# Patient Record
Sex: Female | Born: 1970 | Race: White | Hispanic: No | Smoking: Never smoker
Health system: Southern US, Community
[De-identification: ages and names within clinical notes are randomized; demographics above are authoritative.]

## PROBLEM LIST (undated history)

## (undated) DIAGNOSIS — E669 Obesity, unspecified: Secondary | ICD-10-CM

## (undated) DIAGNOSIS — F419 Anxiety disorder, unspecified: Secondary | ICD-10-CM

## (undated) DIAGNOSIS — R197 Diarrhea, unspecified: Secondary | ICD-10-CM

## (undated) DIAGNOSIS — R0602 Shortness of breath: Secondary | ICD-10-CM

## (undated) DIAGNOSIS — M549 Dorsalgia, unspecified: Secondary | ICD-10-CM

## (undated) DIAGNOSIS — F329 Major depressive disorder, single episode, unspecified: Secondary | ICD-10-CM

## (undated) DIAGNOSIS — E559 Vitamin D deficiency, unspecified: Secondary | ICD-10-CM

## (undated) DIAGNOSIS — E785 Hyperlipidemia, unspecified: Secondary | ICD-10-CM

## (undated) DIAGNOSIS — F32A Depression, unspecified: Secondary | ICD-10-CM

## (undated) DIAGNOSIS — R002 Palpitations: Secondary | ICD-10-CM

## (undated) DIAGNOSIS — R609 Edema, unspecified: Secondary | ICD-10-CM

## (undated) DIAGNOSIS — K829 Disease of gallbladder, unspecified: Secondary | ICD-10-CM

## (undated) DIAGNOSIS — I10 Essential (primary) hypertension: Secondary | ICD-10-CM

## (undated) DIAGNOSIS — F3281 Premenstrual dysphoric disorder: Secondary | ICD-10-CM

## (undated) DIAGNOSIS — K118 Other diseases of salivary glands: Secondary | ICD-10-CM

## (undated) DIAGNOSIS — C49A0 Gastrointestinal stromal tumor, unspecified site: Secondary | ICD-10-CM

## (undated) DIAGNOSIS — R12 Heartburn: Secondary | ICD-10-CM

## (undated) DIAGNOSIS — R079 Chest pain, unspecified: Secondary | ICD-10-CM

## (undated) HISTORY — DX: Hyperlipidemia, unspecified: E78.5

## (undated) HISTORY — DX: Major depressive disorder, single episode, unspecified: F32.9

## (undated) HISTORY — DX: Disease of gallbladder, unspecified: K82.9

## (undated) HISTORY — DX: Anxiety disorder, unspecified: F41.9

## (undated) HISTORY — PX: TONSILLECTOMY: SUR1361

## (undated) HISTORY — DX: Essential (primary) hypertension: I10

## (undated) HISTORY — DX: Shortness of breath: R06.02

## (undated) HISTORY — DX: Dorsalgia, unspecified: M54.9

## (undated) HISTORY — DX: Obesity, unspecified: E66.9

## (undated) HISTORY — PX: APPENDECTOMY: SHX54

## (undated) HISTORY — PX: PARTIAL GASTRECTOMY: SHX2172

## (undated) HISTORY — DX: Diarrhea, unspecified: R19.7

## (undated) HISTORY — DX: Heartburn: R12

## (undated) HISTORY — DX: Palpitations: R00.2

## (undated) HISTORY — DX: Gastrointestinal stromal tumor, unspecified site: C49.A0

## (undated) HISTORY — DX: Vitamin D deficiency, unspecified: E55.9

## (undated) HISTORY — DX: Premenstrual dysphoric disorder: F32.81

## (undated) HISTORY — DX: Edema, unspecified: R60.9

## (undated) HISTORY — PX: CHOLECYSTECTOMY: SHX55

## (undated) HISTORY — DX: Chest pain, unspecified: R07.9

## (undated) HISTORY — DX: Depression, unspecified: F32.A

---

## 2007-11-19 ENCOUNTER — Emergency Department (HOSPITAL_COMMUNITY): Admission: EM | Admit: 2007-11-19 | Discharge: 2007-11-19 | Payer: Self-pay | Admitting: Family Medicine

## 2008-03-08 ENCOUNTER — Emergency Department (HOSPITAL_COMMUNITY): Admission: EM | Admit: 2008-03-08 | Discharge: 2008-03-08 | Payer: Self-pay | Admitting: Family Medicine

## 2009-02-17 ENCOUNTER — Emergency Department (HOSPITAL_COMMUNITY): Admission: EM | Admit: 2009-02-17 | Discharge: 2009-02-17 | Payer: Self-pay | Admitting: Family Medicine

## 2009-05-18 ENCOUNTER — Emergency Department (HOSPITAL_COMMUNITY): Admission: EM | Admit: 2009-05-18 | Discharge: 2009-05-18 | Payer: Self-pay | Admitting: Emergency Medicine

## 2010-05-01 LAB — POCT URINALYSIS DIP (DEVICE)
Bilirubin Urine: NEGATIVE
Glucose, UA: NEGATIVE mg/dL
Ketones, ur: NEGATIVE mg/dL
Specific Gravity, Urine: <=1.005 (ref 1.005–1.030)

## 2010-05-01 LAB — POCT PREGNANCY, URINE: Preg Test, Ur: NEGATIVE

## 2010-05-01 LAB — URINE CULTURE: Colony Count: 10000

## 2010-05-01 LAB — WET PREP, GENITAL: Yeast Wet Prep HPF POC: NONE SEEN

## 2010-07-17 ENCOUNTER — Ambulatory Visit
Admission: RE | Admit: 2010-07-17 | Discharge: 2010-07-17 | Disposition: A | Discharge status: Home / Self Care | Payer: Managed Care, Other (non HMO) | Source: Ambulatory Visit | Attending: Family Medicine | Admitting: Family Medicine

## 2010-07-17 ENCOUNTER — Other Ambulatory Visit: Payer: Self-pay | Admitting: Family Medicine

## 2010-07-17 DIAGNOSIS — R609 Edema, unspecified: Secondary | ICD-10-CM

## 2010-07-17 DIAGNOSIS — R52 Pain, unspecified: Secondary | ICD-10-CM

## 2011-01-15 ENCOUNTER — Emergency Department
Admission: EM | Admit: 2011-01-15 | Discharge: 2011-01-15 | Disposition: A | Discharge status: Home / Self Care | Payer: 59 | Source: Home / Self Care | Attending: Family Medicine | Admitting: Family Medicine

## 2011-01-15 ENCOUNTER — Encounter: Payer: Self-pay | Admitting: *Deleted

## 2011-01-15 DIAGNOSIS — J209 Acute bronchitis, unspecified: Secondary | ICD-10-CM

## 2011-01-15 MED ORDER — BENZONATATE 200 MG PO CAPS: 200.0000 mg | ORAL_CAPSULE | Freq: Every day | ORAL | Status: AC

## 2011-01-15 MED ORDER — CLARITHROMYCIN 500 MG PO TABS: 500.0000 mg | ORAL_TABLET | Freq: Two times a day (BID) | ORAL | Status: AC

## 2011-01-15 NOTE — ED Notes (Signed)
Pt c/o productive cough x 12/25. She has taken sudafed, nyquil, dayquil and guafensin.

## 2011-01-15 NOTE — ED Provider Notes (Signed)
History     CSN: [id number]  Arrival date & time 01/15/11  1111   First MD Initiated Contact with Patient 01/15/11 1230      Chief Complaint  Patient presents with  . Cough      HPI Comments: Patient complains of approximately 7 day history of gradually progressive URI symptoms beginning with a minimal sore throat (now resolved), followed by progressive cough.   There has been minimal nasal congestion.  Complains of fatigue but no myalgias.  Cough is now worse at night and generally  productive during the day.  She often coughs until she gags.  There has been no pleuritic pain, shortness of breath, but she occasionally  wheezes.  She has had a flu shot but does not remember her last Tdap.  Patient is a 41 y.o. female presenting with cough. The history is provided by the patient.  Cough This is a new problem. The current episode started more than 1 week ago. The problem occurs constantly. The problem has been gradually worsening. The cough is productive of sputum. There has been no fever. Associated symptoms include rhinorrhea, sore throat, myalgias and wheezing. Pertinent negatives include no chest pain, no chills, no sweats, no ear congestion, no ear pain, no headaches, no shortness of breath and no eye redness. She has tried cough syrup for the symptoms. The treatment provided no relief. She is not a smoker.    Past Medical History  Diagnosis Date  . Diabetes mellitus   . Hypertension     Past Surgical History  Procedure Date  . Appendectomy   . Cholecystectomy   . Tonsillectomy     History reviewed. No pertinent family history.  History  Substance Use Topics  . Smoking status: Never Smoker   . Smokeless tobacco: Not on file  . Alcohol Use: No    OB History    Grav Para Term Preterm Abortions TAB SAB Ect Mult Living                  Review of Systems  Constitutional: Negative for chills.  HENT: Positive for sore throat and rhinorrhea. Negative for ear pain.     Eyes: Negative for redness.  Respiratory: Positive for cough and wheezing. Negative for shortness of breath.   Cardiovascular: Negative for chest pain.  Musculoskeletal: Positive for myalgias.  Neurological: Negative for headaches.   + sore throat, resolved + cough No pleuritic pain ? wheezing + minimal nasal congestion No post-nasal drainage No sinus pain/pressure No itchy/red eyes No earache No hemoptysis No SOB No fever/chills No nausea No vomiting No abdominal pain No diarrhea No urinary symptoms No skin rashes + fatigue No myalgias No headache Used OTC meds without relief  Allergies  Codeine  Home Medications   Current Outpatient Rx  Name Route Sig Dispense Refill  . LIRAGLUTIDE 18 MG/3ML [place] SOLN Subcutaneous Inject into the skin.      Marland Kitchen LISINOPRIL 10 MG PO TABS Oral Take 10 mg by mouth daily.      Marland Kitchen VITAMIN D (CHOLECALCIFEROL) 400 UNITS PO CHEW Oral Chew by mouth.      . BENZONATATE 200 MG PO CAPS Oral Take 1 capsule (200 mg total) by mouth at bedtime. Take as needed for cough 12 capsule 0  . CLARITHROMYCIN 500 MG PO TABS Oral Take 1 tablet (500 mg total) by mouth 2 (two) times daily. 20 tablet 0    BP 115/81  Pulse 77  Temp(Src) 98.4 F (36.9 C) (Oral)  Resp 18  Ht 5\' 5"  (1.651 m)  Wt 257 lb 4 oz (116.688 kg)  BMI 42.81 kg/m2  SpO2 99%  Physical Exam Nursing notes and Vital Signs reviewed. Appearance:  Patient appears healthy, stated age, and in no acute distress.  Patient is obese (BMI 42.9)   Eyes:  Pupils are equal, round, and reactive to light and accomodation.  Extraocular movement is intact.  Conjunctivae are not inflamed  Ears:  Canals normal.  Tympanic membranes normal.  Nose:  Mildly congested turbinates.  No sinus tenderness.   Pharynx:  Normal Neck:  Supple.  Slightly tender shotty anterior/posterior nodes are palpated bilaterally  Lungs:  Clear to auscultation.  Breath sounds are equal.  Chest:  Distinct tenderness to palpation over  the mid-sternum.  Heart:  Regular rate and rhythm without murmurs, rubs, or gallops.  Abdomen:  Nontender without masses or hepatosplenomegaly.  Bowel sounds are present.  No CVA or flank tenderness.  Extremities:  No edema.  No calf tenderness Skin:  No rash present.   ED Course  Procedures  none      1. Acute bronchitis       MDM  ? Pertussis.  Begin Biaxin, and Tessalon at bedtime. Take Mucinex  (guaifenesin) for cough and congestion.  Increase fluid intake, rest. May use Afrin nasal spray (or generic oxymetazoline) twice daily for about 5 days.  Also recommend using saline nasal spray several times daily and/or saline nasal irrigation. Stop all antihistamines for now, and other non-prescription cough/cold preparations. Followup with PCP if not improving        Donna Christen, MD 01/15/11 1252

## 2011-03-29 ENCOUNTER — Other Ambulatory Visit: Payer: Self-pay

## 2011-03-29 ENCOUNTER — Encounter: Payer: Self-pay | Admitting: Obstetrics and Gynecology

## 2011-04-03 ENCOUNTER — Other Ambulatory Visit: Payer: Self-pay

## 2011-04-03 ENCOUNTER — Encounter: Payer: Self-pay | Admitting: Obstetrics and Gynecology

## 2011-04-17 ENCOUNTER — Other Ambulatory Visit: Payer: Self-pay

## 2011-04-17 DIAGNOSIS — Z30433 Encounter for removal and reinsertion of intrauterine contraceptive device: Secondary | ICD-10-CM

## 2011-05-07 ENCOUNTER — Other Ambulatory Visit: Payer: Self-pay | Admitting: Obstetrics and Gynecology

## 2011-05-07 DIAGNOSIS — E119 Type 2 diabetes mellitus without complications: Secondary | ICD-10-CM | POA: Insufficient documentation

## 2011-05-07 DIAGNOSIS — O139 Gestational [pregnancy-induced] hypertension without significant proteinuria, unspecified trimester: Secondary | ICD-10-CM | POA: Insufficient documentation

## 2011-05-07 NOTE — Telephone Encounter (Signed)
Message routed to Laura.

## 2011-05-08 ENCOUNTER — Other Ambulatory Visit: Payer: Self-pay

## 2011-05-08 ENCOUNTER — Encounter: Payer: Self-pay | Admitting: Obstetrics and Gynecology

## 2011-11-16 ENCOUNTER — Encounter: Payer: Self-pay | Admitting: *Deleted

## 2011-11-16 ENCOUNTER — Encounter: Payer: 59 | Attending: Physician Assistant | Admitting: *Deleted

## 2011-11-16 NOTE — Patient Instructions (Addendum)
Goals:  Follow Diabetes Meal Plan as instructed  Eat 3 meals and 2 snacks, every 3-5 hrs  Limit carbohydrate intake to 30-45 grams carbohydrate/meal  Limit carbohydrate intake to 15 grams carbohydrate/snack  Add lean protein foods to meals/snacks  Monitor glucose levels as instructed by your doctor  Aim for 30 mins of physical activity daily  Bring food record and glucose log to your next nutrition visit 

## 2011-11-16 NOTE — Progress Notes (Signed)
Patient was seen on 11/16/11 for the complete series of three diabetes self-management courses at the [facility]. Current A1c = 5.9%   The following learning objectives were met by the patient during this course: Core 1:  Gain an understanding of diabetes and what causes it  Learn how diabetes is treated and the goals of treatment  Learn why, how and when to test BG  Learn how carbohydrate affects your glucose  Receive a personal food plan and learn how to count carbohydrates  Discover how physical activity enhances glucose control and overall health  Begin to gain confidence in your ability to manage diabetes  Handouts given during this class include:  Type 2 Diabetes: Basics Book  My Food Plan Book  Food and Activity Log  Core 2:  Describe causes, symptoms and treatment of hypoglycemia and hyperglycemia  Learn how to care for your glucose meter and strips  Explain how to manage diabetes during illness  List strategies to follow meal plan when dining out  Describe the effects of alcohol on glucose and how to use it safely  Describe problem solving skills for day-to-day glucose challenges  Describe ways to remain physically active  Describe the impact of regular activity on insulin resistance  Handouts given in this class:  Refrigerator magnet for Sick Day Guidelines  [facility] Oral Medication and Insulin handout  Core 3  Describe how diabetes changes over time  Understand why glucose may be out of target  Learn how diabetes changes over time  Learn about blood pressure, cholesterol, and heart health  Learn about lowering dietary fat and sodium  Understand the benefits of physical activity for heart health  Develop problem solving skills for times when glucose numbers are puzzling  Develop strategies for creating life balance  Learn how to identify if your treatment plan needs to change  Develop strategies for dealing  with stress, depression and staying motivated  Identify healthy weight-loss plans  Gain confidence that you can succeed in caring for your diabetes  Establish 2-3 goals that they will plan to diligently work on until they return                   for the free 66-month follow-up visit   The following handouts were given in class:  3 Month Follow Up Visit handout  Goal setting handout  Class evaluation form  Your patient has established the following 3 month goals for diabetes self-care:  Increase activity 4 to 5 times a week  Take medications daily  Test glucose levels at least once a day 7 days a week  Follow-Up Plan: Patient was offered a 3 month follow-up visit for diabetes self-management education.

## 2012-02-21 ENCOUNTER — Encounter: Payer: 59 | Attending: Physician Assistant | Admitting: *Deleted

## 2012-02-21 DIAGNOSIS — Z713 Dietary counseling and surveillance: Secondary | ICD-10-CM | POA: Insufficient documentation

## 2012-02-21 DIAGNOSIS — E119 Type 2 diabetes mellitus without complications: Secondary | ICD-10-CM | POA: Insufficient documentation

## 2012-02-21 NOTE — Progress Notes (Signed)
HbA1c 5.9% Patient was seen on 02/21/12 for their 3 month follow-up as a part of the diabetes self-management courses at the [facility]. The following learning objectives were met by your patient during this course:  Your patient established the following 3 month goals for diabetes self-care at her previous appointment:  Increase activity 4 to 5 times a week - she reports meeting that goal- goes 3 days/week Take medications daily - she does take her medicationz Test glucose levels at least once a day 7 days a week- she does monitor her glucose   Patient self reports the following:  Diabetes control has improved since diabetes self-management training: has better knowledge about DM.  Cleared up misconceptions Number of days blood glucose is >200: not often Last MD appointment for diabetes: November Changes in treatment plan: N/A Confidence with ability to manage diabetes: much improved Areas for improvement with diabetes self-care: consistency Willingness to participate in diabetes support group: not attending  Please see Diabetes Flow sheet for findings related to patient's self-care.  Follow-Up Plan: Patient is eligible for a "free" 30 minute diabetes self-care appointment in the next year. Patient also requested an appointment for weight management.  She will return in 1 month time

## 2012-03-05 ENCOUNTER — Encounter: Payer: Self-pay | Admitting: Cardiology

## 2012-03-23 ENCOUNTER — Ambulatory Visit: Payer: 59 | Admitting: *Deleted

## 2012-04-22 ENCOUNTER — Encounter: Payer: Self-pay | Admitting: Cardiology

## 2012-05-12 ENCOUNTER — Encounter: Payer: Self-pay | Admitting: Cardiology

## 2012-05-27 ENCOUNTER — Ambulatory Visit (INDEPENDENT_AMBULATORY_CARE_PROVIDER_SITE_OTHER): Payer: Self-pay | Admitting: Family Medicine

## 2012-05-27 VITALS — BP 124/84 | Wt 266.0 lb

## 2012-05-27 DIAGNOSIS — E119 Type 2 diabetes mellitus without complications: Secondary | ICD-10-CM

## 2012-05-27 NOTE — Progress Notes (Signed)
Patient presents today for pharmacy consult as part of the employer-sponsored [employer] to Wellness program. Patient is currenlty followed by Jasmine Buttner, RN and will continue to follow-up with her after this pharmacy consult. Current DM regimen includes Victoza. Patient also remains on daily ACEi and statin. Patient does not require ASA at this time. Patient's most recent follow-up with Jasmine Pigeon, NP, was this past April. Labwork was done at this time, but no medication changes were made.   Diabetes Assessment: Type of Diabetes: Type 2; Year of diagnosis 2007; MD managing Diabetes Jasmine Josephs, NP; does not take an aspirin a day; Lowest CBG 61; Highest CBG 250; hypoglycemia frequent; A1c - 6.6 (prev 5.1) Other Diabetes History: Patient's current DM regimen includes Victoza 1.2 mg daily. Patient reports hypoglycemia as a result of Victoza. Multiple times per week glucose drops to 60s throughout the day. Patient reports if she holds a dose, glucose will not drop. She does not experience hypoglycemia on 0.6 mg dose, but does notice it on 1.2 mg dose. MD is aware and has suggested patient increase testing and eat more regular snacks. I have suggested that patient discuss with her provider the option of transitioning to an oral DPP4 inhibitor to avoid hypoglycemia, yet retain the incretin effect. She will monitor hypoglycemia closely to determine if speaking with provider is necessary. In the past patient has been intolerant to Metformin and does not wish to resume this. Patient has also experienced an increase in A1c over the past few months. This is due to a number of things including discontinuing exercise, allowing diet to deteriorate, 12 lb weight gain, and dicontinuing Victoza for 3-4 months. Patient decided to discontinue Victoza because A1c had decreased to 5.1; however, it has now increased to 6.6. Patient is testing multiple times per week, fasting and when symptomatic, but has room for improvement in this  area. She is also due for an eye exam. Patient denies signs and symptoms of neuropathy.   Lifestyle Factors: Diet - Patient is currently being seen at [facility] for nutrition management. She reports these meetings have been very helpful. She did miss her last appt due to snow, but will reschedule soon. Patient had made significant changes to diet last fall, but has since allowed diet to deteriorate and is aware she needs to get back on track. Patient admits that much of her diet could use improvment, but one of her worst areas is evening/nighttime snacking. Patient snacks throughout the evening, as late as 9-10 pm. We have discussed avoiding snacks after 8 pm.   Exercise - No routine exercise at this time. Last fall patient was attending gym 3 times per week using eliptical machine and swimming. She has not done any routine exercise since November. Still has gym membership. Patient recognizes that she felt much better while exercising and knows she needs to get back on track, but just needs to get motivated again.  Assessment: Patient presents today with elevated A1c of 6.6 (prev 5.1) as a result of discontinuing exercise, dietary improvements, and Victoza for ~4 months. Patient is now back on Victoza, but has not yet resumed diet and exercise. Patient is aware of the importance of getting back on track with will work on this over the coming months. We have set a few small goals and patient will follow-up with Jasmine Buttner, RN in July.  Plan: 1) Avoid snacking after 8 pm 2) Attempt to resume exercise twice per week for 20 min each time 3) Attempt to  be more diligent in testing glucose, test fasting and when symptomatic 4) Continue to monitor for low glucose, speak with Jasmine Martinez at next appt about possibility of switching to Januvia.  5) Call to schedule follow-up with Jasmine Martinez in July.

## 2012-06-11 NOTE — Progress Notes (Signed)
Patient ID: Jasmine Martinez, female   DOB: [birth year], 42 y.o.   MRN: [id number] ATTENDING PHYSICIAN NOTE: I have reviewed the chart and agree with the plan as detailed above. Denny Levy MD Pager [phone]

## 2012-06-13 ENCOUNTER — Emergency Department
Admission: EM | Admit: 2012-06-13 | Discharge: 2012-06-13 | Disposition: A | Discharge status: Home / Self Care | Payer: 59 | Source: Home / Self Care | Attending: Family Medicine | Admitting: Family Medicine

## 2012-06-13 ENCOUNTER — Encounter: Payer: Self-pay | Admitting: *Deleted

## 2012-06-13 DIAGNOSIS — K0889 Other specified disorders of teeth and supporting structures: Secondary | ICD-10-CM

## 2012-06-13 DIAGNOSIS — K089 Disorder of teeth and supporting structures, unspecified: Secondary | ICD-10-CM

## 2012-06-13 DIAGNOSIS — J069 Acute upper respiratory infection, unspecified: Secondary | ICD-10-CM

## 2012-06-13 LAB — POCT CBC W AUTO DIFF (K'VILLE URGENT CARE)

## 2012-06-13 LAB — POCT RAPID STREP A (OFFICE): Rapid Strep A Screen: NEGATIVE

## 2012-06-13 MED ORDER — AMOXICILLIN-POT CLAVULANATE 875-125 MG PO TABS: 1.0000 | ORAL_TABLET | Freq: Two times a day (BID) | ORAL | Status: DC

## 2012-06-13 NOTE — ED Provider Notes (Signed)
History     CSN: [id number]  Arrival date & time 06/13/12  1130   First MD Initiated Contact with Patient 06/13/12 1244      Chief Complaint  Patient presents with  . Sore Throat  . Cough  . Dental Pain       HPI Comments: Patient reports that she developed a sore throat three weeks ago.  A rapid strep test at that time was slightly positive and she was started on PenVK for 10 days, which she finished 4 days ago.  She next developed sinus congestion and a cough that improved.  Over the past 3 to 4 days she has developed increased sinus congestion and her cough has increased, worse at night.  The cough is worse at night and she complains of tightness in her anterior chest.   Yesterday she also developed pain in a left mandibular tooth, with mild swelling in her left jaw and a headache. She states that she has been taking Tessalon twice daily.  The history is provided by the patient.    Past Medical History  Diagnosis Date  . Diabetes mellitus   . Hypertension   . Hyperlipidemia   . Obesity     Past Surgical History  Procedure Laterality Date  . Appendectomy    . Cholecystectomy    . Tonsillectomy      Family History  Problem Relation Age of Onset  . Cancer Paternal Grandfather 42    colon    History  Substance Use Topics  . Smoking status: Never Smoker   . Smokeless tobacco: Never Used  . Alcohol Use: No    OB History   Grav Para Term Preterm Abortions TAB SAB Ect Mult Living   4 1              Review of Systems + sore throat + toothache + cough No pleuritic pain but has tightness in anterior chest No wheezing + nasal congestion ? post-nasal drainage + sinus pain/pressure No itchy/red eyes ? earache No hemoptysis No SOB No fever, + chills No nausea No vomiting No abdominal pain No diarrhea No urinary symptoms No skin rashes + fatigue No myalgias + headache Used OTC meds without relief  Allergies  Codeine  Home Medications   Current  Outpatient Rx  Name  Route  Sig  Dispense  Refill  . atorvastatin (LIPITOR) 20 MG tablet   Oral   Take 20 mg by mouth daily.         . Benzonatate (TESSALON PERLES PO)   Oral   Take by mouth.         . levonorgestrel (MIRENA) 20 MCG/24HR IUD   Intrauterine   1 each by Intrauterine route once.         . Liraglutide (VICTOZA) 18 MG/3ML SOLN   Subcutaneous   Inject into the skin.           Marland Kitchen lisinopril (PRINIVIL,ZESTRIL) 10 MG tablet   Oral   Take 10 mg by mouth daily.           . Vitamin D, Cholecalciferol, 400 UNITS CHEW   Oral   Chew by mouth.           Marland Kitchen amoxicillin-clavulanate (AUGMENTIN) 875-125 MG per tablet   Oral   Take 1 tablet by mouth 2 (two) times daily. Take with food   14 tablet   0     BP 131/81  Pulse 95  Temp(Src) 98.5 F (36.9 C) (  Oral)  Resp 20  Ht 5\' 5"  (1.651 m)  Wt 268 lb (121.564 kg)  BMI 44.6 kg/m2  SpO2 100%  Physical Exam  HENT:  Mouth/Throat:    Indicated left mandibular tooth has a large filling and is tender to tapping.  No swelling of gingiva  Nursing notes and Vital Signs reviewed. Appearance:  Patient appears stated age, and in no acute distress.  Patient is obese (BMI 44.6) Eyes:  Pupils are equal, round, and reactive to light and accomodation.  Extraocular movement is intact.  Conjunctivae are not inflamed  Ears:  Canals normal.  Tympanic membranes normal.  Nose:  Mildly congested turbinates.  No sinus tenderness.   Pharynx:  Normal Neck:  Supple.  Slightly tender shotty anterior/posterior nodes are palpated bilaterally  Lungs:  Clear to auscultation.  Breath sounds are equal.  Chest:  Distinct tenderness to palpation over the mid-sternum.  Heart:  Regular rate and rhythm without murmurs, rubs, or gallops.  Abdomen:  Nontender without masses or hepatosplenomegaly.  Bowel sounds are present.  No CVA or flank tenderness.  Extremities:  No edema.  No calf tenderness Skin:  No rash present.    ED Course    Procedures  none  Labs Reviewed     POCT RAPID STREP A (OFFICE) negative  POCT CBC W AUTO DIFF ([facility])  WBC 9.5; LY 17.7; MO 3.0; GR 79.3; Hgb 13.2; Platelets 312       1. Toothache   2. Acute upper respiratory infections of unspecified site; suspect that patient has developed a new viral URI;  Normal white blood count is reassuring.       MDM  Begin Augmentin.  Throat culture pending. Take plain Mucinex (guaifenesin) twice daily for cough and congestion.  May add Sudafed if blood pressure not affected.  Increase fluid intake, rest. May use Afrin nasal spray (or generic oxymetazoline) twice daily for about 5 days.  Also recommend using saline nasal spray several times daily and saline nasal irrigation (AYR is a common brand) Stop all antihistamines for now, and other non-prescription cough/cold preparations. May take Ibuprofen 200mg , 4 tabs every 8 hours with food for chest/sternum discomfort, toothache, headache, etc. May continue Tessalon for cough at bedtime. Follow-up with dentist in about 5 days. Follow-up with family doctor if not improving 7 to 10 days.          Lattie Haw, MD 06/13/12 [phone]

## 2012-06-13 NOTE — ED Notes (Signed)
Reports having pos strep test 2 wks ago - completed course of PCN VK without any relief.  Continues with sore throat, c/o productive cough with green sputum & feeling feverish.  Saw PCP this past Mon and was told viral infection - given Rx for [employer].  States cough med not helping.  Also started with HA and left lower toothache yesterday; today pain is extending into left jaw and ear.  Continues taking Aleve and Tylenol.

## 2012-06-14 ENCOUNTER — Telehealth: Payer: Self-pay | Admitting: *Deleted

## 2012-06-14 LAB — STREP A DNA PROBE: GASP: POSITIVE

## 2012-08-06 ENCOUNTER — Ambulatory Visit (INDEPENDENT_AMBULATORY_CARE_PROVIDER_SITE_OTHER): Payer: 59 | Admitting: Cardiology

## 2012-08-06 ENCOUNTER — Encounter: Payer: Self-pay | Admitting: Cardiology

## 2012-08-06 VITALS — BP 120/80 | HR 78 | Ht 65.0 in | Wt 268.0 lb

## 2012-08-06 DIAGNOSIS — R0683 Snoring: Secondary | ICD-10-CM

## 2012-08-06 DIAGNOSIS — I10 Essential (primary) hypertension: Secondary | ICD-10-CM

## 2012-08-06 DIAGNOSIS — R072 Precordial pain: Secondary | ICD-10-CM

## 2012-08-06 DIAGNOSIS — R0609 Other forms of dyspnea: Secondary | ICD-10-CM

## 2012-08-06 NOTE — Progress Notes (Signed)
HPI The patient presents for evaluation of chest pain.  She reports an episode 2 weeks ago of chest discomfort that was a pressure or crushing.  She had never had this before. It lasted for 2-3 minutes. She didn't describe radiation to her jaw or to her arms. She didn't describe associated symptoms of nausea vomiting or diaphoresis. It was at rest and she has not been able to reproduce this with activity. She describes other symptoms such as snoring, gasping for breath, daytime somnolence and headaches as well as some sleep disorder movement that are consistent with sleep apnea. She's never had this diagnosis. She does have some dyspnea climbing up stairs but she doesn't describe PND or orthopnea. I   Allergies  Allergen Reactions  . Codeine     Current Outpatient Prescriptions  Medication Sig Dispense Refill  . atorvastatin (LIPITOR) 20 MG tablet Take 20 mg by mouth daily.      Marland Kitchen levonorgestrel (MIRENA) 20 MCG/24HR IUD 1 each by Intrauterine route once.      . Liraglutide (VICTOZA) 18 MG/3ML SOLN Inject 1.2 mg into the skin daily.       Marland Kitchen lisinopril (PRINIVIL,ZESTRIL) 10 MG tablet Take 10 mg by mouth daily.       . pantoprazole (PROTONIX) 40 MG tablet Take 40 mg by mouth daily.      . Vitamin D, Ergocalciferol, (DRISDOL) 50000 UNITS CAPS Take 50,000 Units by mouth.       No current facility-administered medications for this visit.    Past Medical History  Diagnosis Date  . Diabetes mellitus   . Hyperlipidemia   . Obesity     Past Surgical History  Procedure Laterality Date  . Appendectomy    . Cholecystectomy    . Tonsillectomy      Family History  Problem Relation Age of Onset  . Cancer Paternal Grandfather 52    colon    History   Social History  . Marital Status: Married    Spouse Name: N/A    Number of Children: N/A  . Years of Education: N/A   Occupational History  . Not on file.   Social History Main Topics  . Smoking status: Never Smoker   . Smokeless  tobacco: Never Used  . Alcohol Use: No  . Drug Use: No  . Sexually Active: Yes    Birth Control/ Protection: IUD   Other Topics Concern  . Not on file   Social History Narrative  . No narrative on file    ROS:  Positive for headaches, reflux.  Otherwise as stated in the HPI and negative for all other systems.  PHYSICAL EXAM BP 120/80  Pulse 78  Ht 5\' 5"  (1.651 m)  Wt 268 lb (121.564 kg)  BMI 44.6 kg/m2 GENERAL:  Well appearing HEENT:  Pupils equal round and reactive, fundi not visualized, oral mucosa unremarkable NECK:  No jugular venous distention, waveform within normal limits, carotid upstroke brisk and symmetric, no bruits, no thyromegaly LYMPHATICS:  No cervical, inguinal adenopathy LUNGS:  Clear to auscultation bilaterally BACK:  No CVA tenderness CHEST:  Unremarkable HEART:  PMI not displaced or sustained,S1 and S2 within normal limits, no S3, no S4, no clicks, no rubs, no murmurs ABD:  Flat, positive bowel sounds normal in frequency in pitch, no bruits, no rebound, no guarding, no midline pulsatile mass, no hepatomegaly, no splenomegaly EXT:  2 plus pulses throughout, no edema, no cyanosis no clubbing SKIN:  No rashes no nodules NEURO:  Cranial nerves II through XII grossly intact, motor grossly intact throughout [facility]:  Cognitively intact, oriented to person place and time   EKG:  Sinus rhythm, rate 78, axis within normal limits, intervals within normal limits, no acute ST-T wave changes.  08/06/2012  ASSESSMENT AND PLAN  CHEST PAIN:  This is very atypical. She does not have significant cardiovascular risk factors other than controlled diabetes. I agree that this is most likely something other than cardiac such as GI and would continue the proton asked prescribed. However, if she has recurrent symptoms we could reassess with possibly an exercise treadmill test.  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  I will schedule her to see a sleep Dr.  Lavella Lemons: The  patient understands the need to lose weight with diet and exercise. We have discussed specific strategies for this.

## 2012-08-06 NOTE — Patient Instructions (Addendum)
The current medical regimen is effective;  continue present plan and medications.  You have been referred to pulmonary for the evaluation of snoring.  Follow up with Dr Antoine Poche as needed.

## 2012-08-07 ENCOUNTER — Encounter: Payer: Self-pay | Admitting: Cardiology

## 2012-08-07 ENCOUNTER — Other Ambulatory Visit: Payer: Self-pay | Admitting: *Deleted

## 2012-08-07 ENCOUNTER — Telehealth: Payer: Self-pay | Admitting: Cardiology

## 2012-08-07 ENCOUNTER — Telehealth: Payer: Self-pay | Admitting: *Deleted

## 2012-08-07 DIAGNOSIS — R079 Chest pain, unspecified: Secondary | ICD-10-CM

## 2012-08-07 DIAGNOSIS — R0683 Snoring: Secondary | ICD-10-CM

## 2012-08-07 DIAGNOSIS — R072 Precordial pain: Secondary | ICD-10-CM | POA: Insufficient documentation

## 2012-08-07 DIAGNOSIS — G4733 Obstructive sleep apnea (adult) (pediatric): Secondary | ICD-10-CM | POA: Insufficient documentation

## 2012-08-07 NOTE — Telephone Encounter (Signed)
Patient referral for sleep study/pulm eval.

## 2012-08-07 NOTE — Telephone Encounter (Signed)
7/25 Called patient to refer to Pulmonary for evaluation.  Patient would like some time to think about it.

## 2012-08-10 NOTE — Telephone Encounter (Signed)
noted 

## 2012-08-21 ENCOUNTER — Encounter: Payer: Self-pay | Admitting: Pulmonary Disease

## 2012-08-21 ENCOUNTER — Ambulatory Visit (INDEPENDENT_AMBULATORY_CARE_PROVIDER_SITE_OTHER): Payer: 59 | Admitting: Pulmonary Disease

## 2012-08-21 VITALS — BP 122/70 | HR 78 | Ht 65.0 in | Wt 271.0 lb

## 2012-08-21 DIAGNOSIS — R0683 Snoring: Secondary | ICD-10-CM

## 2012-08-21 DIAGNOSIS — R0609 Other forms of dyspnea: Secondary | ICD-10-CM

## 2012-08-21 NOTE — Patient Instructions (Addendum)
Sleep study We discussed cough as side effect of lisinopril

## 2012-08-21 NOTE — Progress Notes (Signed)
  Subjective:    Patient ID: Jasmine Martinez, female    DOB: 01-03-1971, 42 y.o.   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 She reports loud snoring multiple nocturnal awakenings and morning headaches. Epworth sleepiness score is 9/ 24 - she report sleepiness while watching TV or laying down to rest in the afternoon Bedtime is about 11 PM, sleep latency is minimal, she sleeps on her side of her stomach with one pillow. Increased snoring has been reported on her back. She is 3-4 nocturnal awakenings including nocturia and is out of bed at 6:30 AM feeling tired with dryness of mouth and headaches that last up to about 2 hours. She denies recent weight fluctuation. She drinks a 24 ounce Diet Coke everyday. She had a tonsillectomy in 2003 She denies daytime naps per reports excessive daytime fatigue. Thyroid function tests have been normal. There is no history suggestive of cataplexy, sleep paralysis or parasomnias She underwent evaluation for atypical chest pain by cardiology   Past Medical History  Diagnosis Date  . Diabetes mellitus   . Hyperlipidemia   . Obesity   . HTN (hypertension)    Past Surgical History  Procedure Laterality Date  . Appendectomy    . Cholecystectomy    . Tonsillectomy      Allergies  Allergen Reactions  . Codeine     History   Social History  . Marital Status: Married    Spouse Name: N/A    Number of Children: 1  . Years of Education: N/A   Occupational History  . office manager [place]   Social History Main Topics  . Smoking status: Never Smoker   . Smokeless tobacco: Never Used  . Alcohol Use: No  . Drug Use: No  . Sexually Active: Yes    Birth Control/ Protection: IUD   Other Topics Concern  . Not on file   Social History Narrative   Lives with husband and son.     Family History  Problem Relation Age of Onset  . Colon  cancer Paternal Grandfather 28    colon      Review of Systems  Constitutional: Negative for fever and unexpected weight change.  HENT: Positive for postnasal drip. Negative for ear pain, nosebleeds, congestion, sore throat, rhinorrhea, sneezing, trouble swallowing, dental problem and sinus pressure.   Eyes: Negative for redness and itching.  Respiratory: Negative for chest tightness, shortness of breath and wheezing.   Cardiovascular: Negative for palpitations and leg swelling.  Gastrointestinal: Negative for nausea and vomiting.  Genitourinary: Negative for dysuria.  Musculoskeletal: Negative for joint swelling.  Skin: Negative for rash.  Neurological: Positive for headaches.  Hematological: Does not bruise/bleed easily.  Psychiatric/Behavioral: Negative for dysphoric mood. The patient is not nervous/anxious.        Objective:   Physical Exam  Gen. Pleasant, obese, in no distress, normal affect ENT - no lesions, no post nasal drip, class 2-3 airway Neck: No JVD, no thyromegaly, no carotid bruits Lungs: no use of accessory muscles, no dullness to percussion, decreased without rales or rhonchi  Cardiovascular: Rhythm regular, heart sounds  normal, no murmurs or gallops, no peripheral edema Abdomen: soft and non-tender, no hepatosplenomegaly, BS normal. Musculoskeletal: No deformities, no cyanosis or clubbing Neuro:  alert, non focal, no tremors       Assessment & Plan:

## 2012-08-21 NOTE — Assessment & Plan Note (Signed)
Given excessive daytime somnolence, narrow pharyngeal exam, witnessed apneas & loud snoring, obstructive sleep apnea is very likely & an overnight polysomnogram will be scheduled as a split study. The pathophysiology of obstructive sleep apnea , it's cardiovascular consequences & modes of treatment including CPAP were discused with the patient in detail & they evidenced understanding.  

## 2012-09-24 ENCOUNTER — Ambulatory Visit (HOSPITAL_BASED_OUTPATIENT_CLINIC_OR_DEPARTMENT_OTHER): Payer: 59 | Attending: Pulmonary Disease

## 2012-09-24 VITALS — Ht 65.0 in | Wt 270.0 lb

## 2012-09-24 DIAGNOSIS — R0609 Other forms of dyspnea: Secondary | ICD-10-CM | POA: Insufficient documentation

## 2012-09-24 DIAGNOSIS — R0989 Other specified symptoms and signs involving the circulatory and respiratory systems: Secondary | ICD-10-CM | POA: Insufficient documentation

## 2012-09-24 DIAGNOSIS — R0683 Snoring: Secondary | ICD-10-CM

## 2012-09-24 DIAGNOSIS — R5381 Other malaise: Secondary | ICD-10-CM | POA: Insufficient documentation

## 2012-10-05 DIAGNOSIS — R0609 Other forms of dyspnea: Secondary | ICD-10-CM

## 2012-10-05 DIAGNOSIS — R0989 Other specified symptoms and signs involving the circulatory and respiratory systems: Secondary | ICD-10-CM

## 2012-10-06 NOTE — Procedures (Signed)
[name], [name]            ACCOUNT NO.:  [id number]  MEDICAL RECORD NO.:  [id number]          PATIENT TYPE:  OUT  LOCATION:  SLEEP CENTER                 FACILITY[facility]  [facility]  PHYSICIAN:  Oretha Milch, MD      DATE OF BIRTH:  October 21, 1970  DATE OF STUDY:  09/24/2012                           NOCTURNAL POLYSOMNOGRAM  REFERRING PHYSICIAN:  Oretha Milch, MD  INDICATION FOR STUDY:  Jasmine Martinez is a pleasant 42 year old woman with loud snoring, excessive daytime fatigue, and witnessed apneas.  At the time of this study, she weighed 270 pounds with a height of 5 feet 5 inches, BMI of 45, neck size of 15 inches.  EPWORTH SLEEPINESS SCORE:  9.  This nocturnal polysomnogram was performed with a sleep technologist in attendance.  EEG, EOG, EMG, EKG, and respiratory parameters were recorded.  Sleep stages, arousals, limb movements, and respiratory data were scored according to criteria laid out by the American Academy of Sleep Medicine.  MEDICATIONS:  SLEEP ARCHITECTURE:  Lights out was at 9:51 p.m., lights on was at 4:35 a.m.  Total sleep time was 275 minutes with a sleep period time of 383 minutes and a sleep efficiency of 68%.  Sleep latency was 21 minutes, latency to REM sleep was 151 minutes and wake after sleep onset was 107 minutes.  Sleep stages of the percentage of total sleep time was N1 8%, N2 82%, N3 3%, and REM sleep 6.5% (18 minutes).  Supine sleep accounted for 139 minutes.  Supine REM sleep was not noted.  Longest period of REM sleep was around 4:30 a.m.  AROUSAL DATA:  There were 54 arousals with an arousal index of 12 events per hour.  Most of these were spontaneous.  RESPIRATORY DATA:  There were 1 obstructive apnea, 0 central apnea, 0 mixed apnea, and 8 hypopneas with an apnea-hypopnea index of 2 events per hour.  Longest apnea was 51 seconds.  MOVEMENT-PARASOMNIA:  The PLM index was 1.1 events per hour.  Only one of these movements were associated with  arousal.  OXYGEN DATA:  The desaturation index was 3 events per hour.  The lowest desaturation was 80% during non-REM sleep.  She spent 8 minutes with a saturation less than 88%.  CARDIAC DATA:  The low heart rate was 33 beats per minute.  The high heart rate was an artifact.  No arrhythmias were noted.  DISCUSSION:  She was desensitized with a medium nasal mask, but did not meet criteria for intervention.  No oxygen was required.  IMPRESSIONS-RECOMMENDATIONS: 1. No evidence of significant sleep-disordered breathing. 2. Mild REM-related desaturation was noted, but oxygen was not     applied. 3. No evidence of cardiac arrhythmias, significant limb movements or     behavioral disturbance during sleep.  1. Weight loss should be encouraged. 2. Sleep hygiene should be encouraged.  Other causes of daytime     somnolence should be investigated.  The sleep diary can be     requested.  Long REM latency makes narcolepsy less likely. 3. She should be asked to avoid medications with sedative side     effects.  She should be cautioned against driving when sleepy.  Oretha Milch, MD    RVA/MEDQ  D:  10/05/2012 11:54:25  T:  10/06/2012 02:14:53  Job:  [id number]

## 2012-10-09 ENCOUNTER — Other Ambulatory Visit: Payer: Self-pay | Admitting: Obstetrics

## 2012-10-19 ENCOUNTER — Telehealth: Payer: Self-pay | Admitting: Pulmonary Disease

## 2012-10-19 NOTE — Telephone Encounter (Signed)
I spoke with pt. Jasmine Martinez is not in until Thursday. She is aware and will forward to Dr. Vassie Loll so he is aware.

## 2012-10-23 NOTE — Telephone Encounter (Signed)
No evidence of obstructive sleep apnea . If still sleepy -Consider keep sleep diary & OV to discuss

## 2012-10-23 NOTE — Telephone Encounter (Signed)
I spoke with pt. She stated the sleep study she did not sleep at all. She reports her husband reports she wakes up gasping for air. She stated but if that is what the study shows then that is fine. I advised will forward to Dr. Vassie Loll to advise him pt reports she did not sleep that night.

## 2012-10-23 NOTE — Telephone Encounter (Signed)
She slept for 4.5 h out of 6.5 h in bed Mild oxygen desaturation Pl schedule oV to discuss

## 2012-10-23 NOTE — Telephone Encounter (Signed)
I spoke with pt. She stated she does not want to come in and pay another co-pay. She stated she will call if she needs [place]. Will sign off message

## 2012-11-04 ENCOUNTER — Encounter: Payer: Self-pay | Admitting: Gastroenterology

## 2012-11-19 ENCOUNTER — Other Ambulatory Visit: Payer: Self-pay

## 2012-11-27 ENCOUNTER — Encounter: Payer: Self-pay | Admitting: *Deleted

## 2012-11-27 ENCOUNTER — Encounter: Payer: 59 | Attending: Internal Medicine | Admitting: *Deleted

## 2012-11-27 DIAGNOSIS — E119 Type 2 diabetes mellitus without complications: Secondary | ICD-10-CM | POA: Insufficient documentation

## 2012-11-27 DIAGNOSIS — Z713 Dietary counseling and surveillance: Secondary | ICD-10-CM | POA: Insufficient documentation

## 2012-11-27 NOTE — Progress Notes (Signed)
  Patient was seen on 11/27/12 for their 6 month follow-up as a part of the diabetes self-management courses at [facility]. The following learning objectives were met by your patient during this course:  Patient self reports the following:  HbA1c on October 31: 6.8%.  Patient states that she has not been managing her diabetes well lately.  She has been "blue" and hasn't been taking her medication, monitoring her glucose, or eating balanced meals.  Her A1c increased from 5.9% to 6.8%.   She has a new doctor and She states that she feels like she's getting back on track.  Monitors glucose 3-4 times a week.  I have strongly suggested mental health therapy.  Diabetes control has improved since diabetes self-management training: feels more knowlegeable.  She learned how to manage low blood sugar Number of days blood glucose is >200: more recently due to emotional  Last MD appointment for diabetes: 2 weeks ago Changes in treatment plan: none Confidence with ability to manage diabetes: she knows what to do Areas for improvement with diabetes self-care: being more consistent on taking medication as prescribed, monitoring glucose, and counting carbohydrates Willingness to participate in diabetes support group: haven't participated yet, but might be interested   Follow-Up Plan: prn

## 2012-12-08 ENCOUNTER — Telehealth: Payer: Self-pay | Admitting: Gastroenterology

## 2012-12-08 ENCOUNTER — Ambulatory Visit: Payer: 59 | Admitting: Gastroenterology

## 2012-12-08 NOTE — Telephone Encounter (Signed)
Do not bill 

## 2013-02-10 ENCOUNTER — Encounter: Payer: 59 | Attending: Internal Medicine | Admitting: *Deleted

## 2013-02-10 ENCOUNTER — Encounter: Payer: Self-pay | Admitting: *Deleted

## 2013-02-10 DIAGNOSIS — E119 Type 2 diabetes mellitus without complications: Secondary | ICD-10-CM | POA: Insufficient documentation

## 2013-02-10 DIAGNOSIS — Z713 Dietary counseling and surveillance: Secondary | ICD-10-CM | POA: Insufficient documentation

## 2013-02-10 DIAGNOSIS — E669 Obesity, unspecified: Secondary | ICD-10-CM

## 2013-02-10 NOTE — Progress Notes (Signed)
  Medical Nutrition Therapy:  Appt start time: 0930 end time:  1000.  Assessment:  Primary concerns today: Chrisann is here for nutrition counseling.  She attended our Core Classes for diabetes mangement last year.  She is here now for weight loss education.  She is enrolled in the [place] weight loss program for employees: she is on a team of 5 people and they weigh weekly for 4 months.  She is really struggling because there is no coaching involved or guidance on how to loose weight successfully.  She has been on the waiting list for the [employer] program and will start soon.  She's been going to the gym some, but she's really confused.  She is "supposed" to lose 16% of her weight by the end of the challenge, which equates to 4 pounds/week lost.  She has lost 10 pounds in 2 week, but now she's at a plateau.  She is watching her carbs (she has type 2), eating lean proteins, and exercising.  Her calories run less than 1000 each day.  One day she only ate 767 calories.  Her glucose readings are now much lower than she normally has.  (like half of her normal readings of 250 mg/dl now she runs 120s).  She started counseling at [facility] Elnora Morrison)  Preferred Learning Style:   Visual  Learning Readiness:   Change in progress   MEDICATIONS: see list   DIETARY INTAKE:  Usual eating pattern includes 3 meals and 1 snacks per day.  Everyday foods include vegetables, lean proteins, some dairy and/or milk.  Avoided foods include all others.    24-hr recall:  B ( AM): egg white delight   Snk ( AM): none  L ( PM): salad with chicken or [place] Snk ( PM): Adkins drink Snk (PM): Skim milk after gym D ( PM): grilled chicken or tuna Snk ( PM): clementine, but that raised her glucose so she's not eating anything Beverages: unsweet tea, water,maybe diet coke  Usual physical activity: gym 3 times/week.  20 minutes on elliptical.  No weight training.  Estimated energy needs: 1800  calories 200 g carbohydrates 135 g protein 50 g fat   Nutritional Diagnosis:  NB-1.2 Harmful beliefs/attitudes about food or nutrition-related topics As related to calorie anad carbohydrate needs for weight loss.  As evidenced by strict carbohydrate restriction to 45g/day and strict calorie restriction of 1000/day.    Intervention:  Nutrition counseling provided.  Discussed metabolic effects of severe calorie restriction and encouraged at least 1200 kcal/day, if not more.  Recommended 30g carbohydrate/meal and 10-15g.snack.  Encouraged adequate protein to ensure LBM protection.  Suggested weight training to increase LBM  Teaching Method Utilized: Visual Auditory  Handouts given during visit include:  My Meal plan card  Barriers to learning/adherence to lifestyle change: pressure to lose weight quickly and unhealthfully in weight loss competition  Demonstrated degree of understanding via:  Teach Back   Monitoring/Evaluation:  Dietary intake, exercise, BGM, and body weight in 3 week(s).

## 2013-03-09 ENCOUNTER — Ambulatory Visit: Payer: 59 | Admitting: *Deleted

## 2013-04-08 ENCOUNTER — Encounter: Payer: 59 | Attending: Internal Medicine | Admitting: *Deleted

## 2013-04-08 DIAGNOSIS — Z713 Dietary counseling and surveillance: Secondary | ICD-10-CM | POA: Insufficient documentation

## 2013-04-08 DIAGNOSIS — E669 Obesity, unspecified: Secondary | ICD-10-CM

## 2013-04-08 DIAGNOSIS — E119 Type 2 diabetes mellitus without complications: Secondary | ICD-10-CM | POA: Insufficient documentation

## 2013-04-08 NOTE — Progress Notes (Signed)
  Medical Nutrition Therapy:  Appt start time: 0930 end time:  1000.  Assessment:  Lost 28 pounds since January.  She is exercising 3 times a week through [employer] program: strength training for 30 minutes and.  30 minutes cardio (typically on treadmill).  She is happy.  Her glucose runs a little low post-workout so she's eating a little beforehand: english muffin with canadian bacon and egg white.  She complains of hunger throughout the day.  She averages around 1200 kcal/day   Preferred Learning Style:   Visual  Learning Readiness:   Change in progress  MEDICATIONS: see list   DIETARY INTAKE:  Usual eating pattern includes 3 meals and 1 snacks per day.  Everyday foods include vegetables, lean proteins, some dairy and/or milk.  Avoided foods include all others.    24-hr recall:  B ( AM): egg white, canadian bacon english muffin sandwich with unsweetened beverage Snk ( AM): 1% milk 8 oz L ( PM): Adkins drink or sandwich without bread (meat and vegetables) and apple Snk ( PM): 5-6 almonds or apple or Adkins bar D ( PM): grilled chicken or tuna or pot roast with vegetables Snk ( PM):some kind of fruit Beverages: unsweet tea, water,maybe diet coke  Usual physical activity: 3 days  Estimated energy needs: 1800 calories 200 g carbohydrates 135 g protein 50 g fat   Nutritional Diagnosis:  NB-1.2 Harmful beliefs/attitudes about food or nutrition-related topics As related to calorie anad carbohydrate needs for weight loss.  As evidenced by strict carbohydrate restriction to 45g/day and strict calorie restriction of 1000/day.    Intervention:  Nutrition counseling provided.  Discussed metabolic effects of severe calorie restriction and encouraged at least 1200 kcal/day, if not more.  Recommended 30g carbohydrate/meal and 10-15g.snack.  Encouraged adequate protein to ensure LBM protection.    Teaching Method Utilized: Auditory  Handouts given during visit include:  My Meal plan  card  Barriers to learning/adherence to lifestyle change: pressure to lose weight quickly and unhealthfully in weight loss competition  Demonstrated degree of understanding via:  Teach Back   Monitoring/Evaluation:  Dietary intake, exercise, BGM, and body weight prn.

## 2013-10-29 ENCOUNTER — Other Ambulatory Visit: Payer: Self-pay

## 2013-11-11 ENCOUNTER — Other Ambulatory Visit (INDEPENDENT_AMBULATORY_CARE_PROVIDER_SITE_OTHER): Payer: 59

## 2013-11-11 DIAGNOSIS — M545 Low back pain: Secondary | ICD-10-CM

## 2013-11-15 ENCOUNTER — Encounter: Payer: Self-pay | Admitting: *Deleted

## 2013-11-15 ENCOUNTER — Ambulatory Visit (INDEPENDENT_AMBULATORY_CARE_PROVIDER_SITE_OTHER): Payer: Self-pay | Admitting: Family Medicine

## 2013-11-15 VITALS — BP 108/72 | Wt 244.6 lb

## 2013-11-15 DIAGNOSIS — E119 Type 2 diabetes mellitus without complications: Secondary | ICD-10-CM

## 2013-11-15 NOTE — Assessment & Plan Note (Signed)
Subjective:  Patient is here for an annual pharmacy visit. Patient presents today for 3 month diabetes follow-up as part of the employer-sponsored [employer] program. Current diabetes regimen includes Victoza 1.8 mg SQ daily.   Last time she saw Dr. Tilden Dome was six months ago. She doesn't have another appointment pending. She hasn't had A1C checked since her last visit with Melissa in July. We will check A1C today. She was previously taking Victoza 1.2 mg SQ daily, but noticed her blood sugars were elevated so she self titrated dose to 1.8 mg SQ daily. She states that she is not sure if she wants to continue seeing Dr. Tilden Dome. I explained to patient that she could see another PCP within [facility] for zero copay. I encouraged her to give Dr. Amedeo Kinsman one more try and then if it still wasn't working with her to find another PCP.  Her husband had major surgery a month ago and she has been taking care of him. He had open heart surgery at [facility] last month. She has had to take care of more things at home. He had a CABGx3. He has not gone back to work yet, he may go back in January. This has contributed to increased stress within her life and she hasn't been following her previous habits of physical activity and healthy eating.    Disease Assessments:  Diabetes: Type of Diabetes: Type 2; Year of diagnosis 2007; Sees Diabetes provider 2 times per year; Current Diabetes related medical conditions are High blood pressure, High cholesterol; takes medications as prescribed; does not take an aspirin a day; MD managing Diabetes Elenora Gamma; Diabetes Education Completed required education; checks feet daily; What is target A1c? 7.0 %;   checks blood glucose 2-6 times a week; What are the signs of hyperglycemia? High blood glucose; What are the signs and symptoms of hypoglycemia? Jacqlyn Krauss; uses glucometer True Result; hypoglycemia frequency none States occurred when she ate a light lunch and did not eat an  afternoon snack;   Lowest CBG 80; Highest CBG 180 a few weeks ago- she states that she hadn't been taking medication;   Other Diabetes History:  She states she is checking 4-5 times a week for blood sugar. Since she hasn't been eating as well her blood sugars haven't been dropping so she doesn't feel like she needs to check as often.  She is checking fasting and she states that they are running 105-110. She states that when she is watching her carbs they are in the 80-90s.  Patient forgot her meter today. High and lows are patient reported.  A1C today was 5.8%.             Social History:  Alcohol use: rarely; Wine Rarely; Patient can afford medications; Patient knows the purpose/use of medications; 180 minutes of exercise per week; Medication adherence adherent; Exercise adherence 3-4 days a week; Diet adherence Greater than 75% of the time.  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 She previously was running over the summer and hasn't done anything since the end of July/August. She stopped because it was hot and she states that "she just couldn't do it anymore". In the past she has gone to the gym and used the elliptical machine and she has enjoyed that. She states that she gets bored walking. She does like hiking. She is working 10 hour days 5 days a week. She does not take a full  lunch break- she eats while she is working.  Her son is going to audition for drum and [employer] but needs to lose weight in order to do this. She wants to start working with him to lose weight. She wants to walk on the track.  Nutrition- the last month she states has been crazy. She hasn't been eating as good. She has been eating fast food more often. When her husband was in the hospital for 2 weeks she was eating out most of her meals. Previously she was counting carbohydrates and eating around 45 grams with each meal. She was  also tracking all of her food into My Fitness Pal.             Vital Signs:  11/12/2013 11:39 AM (EST)Blood Pressure 108 / 72 mm/HgBMI 40.7; Height 5 ft 5 in; Weight 244.6 lbs   Testing:  Blood Sugar Tests: Hemoglobin A1c: 5.8 via POCT resulted on 11/12/2013          Care Planning:  Learning Preference Assessment:  Learner: Patient, Family  Readiness to Learn Barriers: None  Teaching Method: Demonstration, Explanation, Handout  Evaluation of Learning: Can function independently and verbalize knowledge  Readiness to Change:  How important is your health to you? 10  How confident are you in working to improve your health? 10  How ready are you to change to improve your health? 10  Total Score: 10  Care Plan:  11/12/2013 11:39 AM (EST) (1)  Problem: Physical Inactivity  Role: Clinical Pharmacist  Long Term Goal Start walking four days a week with your son around the track at the school.  Date Started: 11/13/2013     Assessment/Plan:  Patient is a 43 year old female with DM2. A1C today was 5.8% and is meeting goal of less than 6.5%. Patient has, however, gained weight since her last visit with [facility]. Her husband had CABGx3 surgery a month ago and was hospitalized for 2 weeks. She states that she was eating fast food and not paying attention to what she was eating. She also stopped tracking her food intake. She stopped physical activity over the summer and has not been exercising.  Patient appears to know what to do in order to lose weight and to count carbohydrates, but admits that it hasn't been a priority lately. She states that she wants to start "being better" so that she doesn't regain the weight she lost earlier in the year (she was close to 300 pounds at the beginning of the year). We set goals for patient to start tracking her food intake again and to restart physical activity. She is very busy and works an hour away from home so she will need to manage her time carefully in  order to be able to accomplish this.  I will send A1C results to Dr. Nena Jordan. Patient is due for a PCP visit. I encouraged her to call and schedule this appointment. Patient will follow up with Melissa at [facility].  Goals for Next Visit- 1. Start walking at the track with your son. Days to go- Tuesday, Wednesday, Saturday and Sunday. Aim to walk for 30 minutes. Start this weekend! 2. Start using my fitness pal again to track your food. 3. Find something that you can use as a motivation- for example- give yourself a dollar for each day that you track your food. Decide what you want to work towards- example- a new pair of running/walking shoes. 4. Make an appointment for a dental  cleaning. 5. As you are making better eating choices and are following 45 grams of carbohydrates for each meal, you may need to step back down on your Victoza to 1.2 mg or 0.6 mg. Watch your blood sugars. Call Melissa at [facility] to schedule your next appointment to see her.

## 2013-11-23 MED ORDER — KETOROLAC TROMETHAMINE 60 MG/2ML IM SOLN
60.0000 mg | Freq: Once | INTRAMUSCULAR | Status: AC
  Administered 2013-11-11: 60 mg via INTRAMUSCULAR

## 2013-11-23 MED ORDER — KETOROLAC TROMETHAMINE 60 MG/2ML IM SOLN: 60.0000 mg | Freq: Once | INTRAMUSCULAR | Status: AC

## 2013-11-23 NOTE — Progress Notes (Signed)
Per Dr. Jodi Mourning, okay to give Toradol injection for back pain.

## 2013-12-13 ENCOUNTER — Emergency Department (HOSPITAL_BASED_OUTPATIENT_CLINIC_OR_DEPARTMENT_OTHER)
Admission: EM | Admit: 2013-12-13 | Discharge: 2013-12-13 | Disposition: A | Discharge status: Home / Self Care | Payer: 59 | Attending: Emergency Medicine | Admitting: Emergency Medicine

## 2013-12-13 ENCOUNTER — Encounter (HOSPITAL_BASED_OUTPATIENT_CLINIC_OR_DEPARTMENT_OTHER): Payer: Self-pay | Admitting: *Deleted

## 2013-12-13 DIAGNOSIS — E785 Hyperlipidemia, unspecified: Secondary | ICD-10-CM | POA: Insufficient documentation

## 2013-12-13 DIAGNOSIS — M545 Low back pain, unspecified: Secondary | ICD-10-CM

## 2013-12-13 DIAGNOSIS — E669 Obesity, unspecified: Secondary | ICD-10-CM | POA: Diagnosis not present

## 2013-12-13 DIAGNOSIS — Y998 Other external cause status: Secondary | ICD-10-CM | POA: Insufficient documentation

## 2013-12-13 DIAGNOSIS — Z79899 Other long term (current) drug therapy: Secondary | ICD-10-CM | POA: Insufficient documentation

## 2013-12-13 DIAGNOSIS — Y9389 Activity, other specified: Secondary | ICD-10-CM | POA: Insufficient documentation

## 2013-12-13 DIAGNOSIS — S199XXA Unspecified injury of neck, initial encounter: Secondary | ICD-10-CM | POA: Diagnosis not present

## 2013-12-13 DIAGNOSIS — Y92410 Unspecified street and highway as the place of occurrence of the external cause: Secondary | ICD-10-CM | POA: Insufficient documentation

## 2013-12-13 DIAGNOSIS — I10 Essential (primary) hypertension: Secondary | ICD-10-CM | POA: Diagnosis not present

## 2013-12-13 DIAGNOSIS — S3992XA Unspecified injury of lower back, initial encounter: Secondary | ICD-10-CM | POA: Insufficient documentation

## 2013-12-13 DIAGNOSIS — S0990XA Unspecified injury of head, initial encounter: Secondary | ICD-10-CM | POA: Insufficient documentation

## 2013-12-13 MED ORDER — DIAZEPAM 5 MG PO TABS: 5.0000 mg | ORAL_TABLET | Freq: Four times a day (QID) | ORAL | Status: DC | PRN
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
## 2013-12-13 NOTE — Discharge Instructions (Signed)
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
## 2013-12-13 NOTE — ED Notes (Signed)
C-collar applied in triage.

## 2013-12-30 ENCOUNTER — Ambulatory Visit (INDEPENDENT_AMBULATORY_CARE_PROVIDER_SITE_OTHER): Payer: 59 | Admitting: Obstetrics & Gynecology

## 2013-12-30 DIAGNOSIS — R109 Unspecified abdominal pain: Secondary | ICD-10-CM

## 2014-01-01 LAB — CBC
HEMATOCRIT: 39 % (ref 36.0–46.0)
Hemoglobin: 13.2 g/dL (ref 12.0–15.0)
MCH: 30.5 pg (ref 26.0–34.0)
MCHC: 33.8 g/dL (ref 30.0–36.0)
MCV: 90.1 fL (ref 78.0–100.0)
MPV: 10.6 fL (ref 9.4–12.4)
PLATELETS: 310 10*3/uL (ref 150–400)
RBC: 4.33 MIL/uL (ref 3.87–5.11)
RDW: 12.6 % (ref 11.5–15.5)
WBC: 9 10*3/uL (ref 4.0–10.5)

## 2014-01-02 ENCOUNTER — Encounter: Payer: Self-pay | Admitting: Obstetrics & Gynecology

## 2014-01-02 NOTE — Patient Instructions (Signed)
Pelvic Pain Female pelvic pain can be caused by many different things and start from a variety of places. Pelvic pain refers to pain that is located in the lower half of the abdomen and between your hips. The pain may occur over a short period of time (acute) or may be reoccurring (chronic). The cause of pelvic pain may be related to disorders affecting the female reproductive organs (gynecologic), but it may also be related to the bladder, kidney stones, an intestinal complication, or muscle or skeletal problems. Getting help right away for pelvic pain is important, especially if there has been severe, sharp, or a sudden onset of unusual pain. It is also important to get help right away because some types of pelvic pain can be life threatening.  CAUSES  Below are only some of the causes of pelvic pain. The causes of pelvic pain can be in one of several categories.   Gynecologic.  Pelvic inflammatory disease.  Sexually transmitted infection.  Ovarian cyst or a twisted ovarian ligament (ovarian torsion).  Uterine lining that grows outside the uterus (endometriosis).  Fibroids, cysts, or tumors.  Ovulation.  Pregnancy.  Pregnancy that occurs outside the uterus (ectopic pregnancy).  Miscarriage.  Labor.  Abruption of the placenta or ruptured uterus.  Infection.  Uterine infection (endometritis).  Bladder infection.  Diverticulitis.  Miscarriage related to a uterine infection (septic abortion).  Bladder.  Inflammation of the bladder (cystitis).  Kidney stone(s).  Gastrointestinal.  Constipation.  Diverticulitis.  Neurologic.  Trauma.  Feeling pelvic pain because of mental or emotional causes (psychosomatic).  Cancers of the bowel or pelvis. EVALUATION  Your caregiver will want to take a careful history of your concerns. This includes recent changes in your health, a careful gynecologic history of your periods (menses), and a sexual history. Obtaining your family  history and medical history is also important. Your caregiver may suggest a pelvic exam. A pelvic exam will help identify the location and severity of the pain. It also helps in the evaluation of which organ system may be involved. In order to identify the cause of the pelvic pain and be properly treated, your caregiver may order tests. These tests may include:   A pregnancy test.  Pelvic ultrasonography.  An X-ray exam of the abdomen.  A urinalysis or evaluation of vaginal discharge.  Blood tests. HOME CARE INSTRUCTIONS   Only take over-the-counter or prescription medicines for pain, discomfort, or fever as directed by your caregiver.   Rest as directed by your caregiver.   Eat a balanced diet.   Drink enough fluids to make your urine clear or pale yellow, or as directed.   Avoid sexual intercourse if it causes pain.   Apply warm or cold compresses to the lower abdomen depending on which one helps the pain.   Avoid stressful situations.   Keep a journal of your pelvic pain. Write down when it started, where the pain is located, and if there are things that seem to be associated with the pain, such as food or your menstrual cycle.  Follow up with your caregiver as directed.  SEEK MEDICAL CARE IF:  Your medicine does not help your pain.  You have abnormal vaginal discharge. SEEK IMMEDIATE MEDICAL CARE IF:   You have heavy bleeding from the vagina.   Your pelvic pain increases.   You feel light-headed or faint.   You have chills.   You have pain with urination or blood in your urine.   You have uncontrolled diarrhea   or vomiting.   You have a fever or persistent symptoms for more than 3 days.  You have a fever and your symptoms suddenly get worse.   You are being physically or sexually abused.  MAKE SURE YOU:  Understand these instructions.  Will watch your condition.  Will get help if you are not doing well or get worse. Document Released:  11/28/2003 Document Revised: 05/17/2013 Document Reviewed: 04/22/2011 ExitCare Patient Information 2015 [employer]. This information is not intended to replace advice given to you by your health care provider. Make sure you discuss any questions you have with your health care provider.  
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
## 2014-01-03 NOTE — Progress Notes (Signed)
Patient ID: Jasmine Martinez, female   DOB: Jul 16, 1970, 43 y.o.   MRN: [id number] Reviewed: Agree with the documentation and management of our [place].

## 2014-01-11 ENCOUNTER — Encounter: Payer: Self-pay | Admitting: Obstetrics & Gynecology

## 2014-03-10 ENCOUNTER — Encounter (HOSPITAL_COMMUNITY): Payer: Self-pay | Admitting: *Deleted

## 2014-03-22 ENCOUNTER — Other Ambulatory Visit: Payer: Self-pay | Admitting: Gastroenterology

## 2014-03-22 NOTE — Addendum Note (Signed)
Addended by: Arta Silence on: 03/22/2014 12:20 PM   Modules accepted: Orders

## 2014-03-23 ENCOUNTER — Ambulatory Visit (HOSPITAL_COMMUNITY): Payer: 59 | Admitting: Certified Registered"

## 2014-03-23 ENCOUNTER — Encounter (HOSPITAL_COMMUNITY): Payer: Self-pay | Admitting: Gastroenterology

## 2014-03-23 ENCOUNTER — Ambulatory Visit (HOSPITAL_COMMUNITY)
Admission: RE | Admit: 2014-03-23 | Discharge: 2014-03-23 | Disposition: A | Discharge status: Home / Self Care | Payer: 59 | Source: Ambulatory Visit | Attending: Gastroenterology | Admitting: Gastroenterology

## 2014-03-23 ENCOUNTER — Encounter (HOSPITAL_COMMUNITY)
Admission: RE | Disposition: A | Discharge status: Home / Self Care | Payer: Self-pay | Source: Ambulatory Visit | Attending: Gastroenterology

## 2014-03-23 DIAGNOSIS — D481 Neoplasm of uncertain behavior of connective and other soft tissue: Secondary | ICD-10-CM | POA: Insufficient documentation

## 2014-03-23 DIAGNOSIS — K3189 Other diseases of stomach and duodenum: Secondary | ICD-10-CM | POA: Diagnosis present

## 2014-03-23 DIAGNOSIS — G4730 Sleep apnea, unspecified: Secondary | ICD-10-CM | POA: Insufficient documentation

## 2014-03-23 DIAGNOSIS — Z7982 Long term (current) use of aspirin: Secondary | ICD-10-CM | POA: Diagnosis not present

## 2014-03-23 DIAGNOSIS — Z79899 Other long term (current) drug therapy: Secondary | ICD-10-CM | POA: Diagnosis not present

## 2014-03-23 DIAGNOSIS — E119 Type 2 diabetes mellitus without complications: Secondary | ICD-10-CM | POA: Insufficient documentation

## 2014-03-23 DIAGNOSIS — I10 Essential (primary) hypertension: Secondary | ICD-10-CM | POA: Insufficient documentation

## 2014-03-23 DIAGNOSIS — Z9049 Acquired absence of other specified parts of digestive tract: Secondary | ICD-10-CM | POA: Diagnosis not present

## 2014-03-23 DIAGNOSIS — Z9089 Acquired absence of other organs: Secondary | ICD-10-CM | POA: Diagnosis not present

## 2014-03-23 DIAGNOSIS — E785 Hyperlipidemia, unspecified: Secondary | ICD-10-CM | POA: Insufficient documentation

## 2014-03-23 HISTORY — PX: FINE NEEDLE ASPIRATION: SHX5430

## 2014-03-23 HISTORY — PX: EUS: SHX5427

## 2014-03-23 SURGERY — ESOPHAGEAL ENDOSCOPIC ULTRASOUND (EUS) RADIAL
Anesthesia: Monitor Anesthesia Care

## 2014-03-23 MED ORDER — PROPOFOL 10 MG/ML IV BOLUS
INTRAVENOUS | Status: AC
  Filled 2014-03-23: qty 20

## 2014-03-23 MED ORDER — LACTATED RINGERS IV SOLN
INTRAVENOUS | Status: DC
  Administered 2014-03-23: 1000 mL via INTRAVENOUS

## 2014-03-23 MED ORDER — PROPOFOL 10 MG/ML IV BOLUS
INTRAVENOUS | Status: DC | PRN
  Administered 2014-03-23 (×2): 100 mg via INTRAVENOUS
  Administered 2014-03-23: 84 mg via INTRAVENOUS
  Administered 2014-03-23: 50 mg via INTRAVENOUS
  Administered 2014-03-23: 100 mg via INTRAVENOUS
  Administered 2014-03-23: 50 mg via INTRAVENOUS

## 2014-03-23 MED ORDER — SODIUM CHLORIDE 0.9 % IV SOLN: INTRAVENOUS | Status: DC

## 2014-03-23 MED ORDER — PROPOFOL INFUSION 10 MG/ML OPTIME
INTRAVENOUS | Status: DC | PRN
  Administered 2014-03-23: 140 ug/kg/min via INTRAVENOUS

## 2014-03-23 NOTE — Discharge Instructions (Signed)
Endoscopic Ultrasound ° °Care After °Please read the instructions outlined below and refer to this sheet in the next few weeks. These discharge instructions provide you with general information on caring for yourself after you leave the hospital. Your doctor may also give you specific instructions. While your treatment has been planned according to the most current medical practices available, unavoidable complications occasionally occur. If you have any problems or questions after discharge, please call Dr. Birdie Fetty ([facility]) at [phone]. ° °HOME CARE INSTRUCTIONS °Activity °· You may resume your regular activity but move at a slower pace for the next 24 hours.  °· Take frequent rest periods for the next 24 hours.  °· Walking will help expel (get rid of) the air and reduce the bloated feeling in your abdomen.  °· No driving for 24 hours (because of the anesthesia (medicine) used during the test).  °· You may shower.  °· Do not sign any important legal documents or operate any machinery for 24 hours (because of the anesthesia used during the test).  °Nutrition °· Drink plenty of fluids.  °· You may resume your normal diet.  °· Begin with a light meal and progress to your normal diet.  °· Avoid alcoholic beverages for 24 hours or as instructed by your caregiver.  °Medications °You may resume your normal medications unless your caregiver tells you otherwise. °What you can expect today °· You may experience abdominal discomfort such as a feeling of fullness or "gas" pains.  °· You may experience a sore throat for 2 to 3 days. This is normal. Gargling with salt water may help this.  °·  °SEEK IMMEDIATE MEDICAL CARE IF: °· You have excessive nausea (feeling sick to your stomach) and/or vomiting.  °· You have severe abdominal pain and distention (swelling).  °· You have trouble swallowing.  °· You have a temperature over 100° F (37.8° C).  °· You have rectal bleeding or vomiting of blood.  °Document  Released: 08/15/2003 Document Revised: 09/12/2010 Document Reviewed: 02/25/2007 °ExitCare® Patient Information ©2012 [employer]. °

## 2014-03-23 NOTE — Transfer of Care (Signed)
Immediate Anesthesia Transfer of Care Note  Patient: Jasmine Martinez  Procedure(s) Performed: Procedure(s) (LRB): ESOPHAGEAL ENDOSCOPIC ULTRASOUND (EUS) RADIAL (N/A) FINE NEEDLE ASPIRATION (FNA) LINEAR (N/A)  Patient Location: PACU  Anesthesia Type: MAC  Level of Consciousness: sedated, patient cooperative and responds to stimulation  Airway & Oxygen Therapy: Patient Spontanous Breathing and Patient connected to face mask oxgen  Post-op Assessment: Report given to PACU RN and Post -op Vital signs reviewed and stable  Post vital signs: Reviewed and stable  Complications: No apparent anesthesia complications

## 2014-03-23 NOTE — H&P (Signed)
[facility] H/P Note  Chief Complaint: gastric nodule  HPI: Jasmine Martinez is an 44 y.o. female who had endoscopy by Dr. Michail Sermon for evaluation of abdominal pain.  Had incidental notation of submucosal-appearing gastric nodule.  Here for endoscopic ultrasound to further assess.  Past Medical History  Diagnosis Date  . Diabetes mellitus   . Hyperlipidemia   . Obesity   . HTN (hypertension)     Past Surgical History  Procedure Laterality Date  . Appendectomy    . Cholecystectomy    . Tonsillectomy      Medications Prior to Admission  Medication Sig Dispense Refill  . aspirin EC 81 MG tablet Take 81 mg by mouth daily.    Marland Kitchen FLUoxetine (PROZAC) 20 MG capsule Take 20 mg by mouth at bedtime.    Marland Kitchen levonorgestrel (MIRENA) 20 MCG/24HR IUD 1 each by Intrauterine route once.    . Liraglutide (VICTOZA) 18 MG/3ML SOLN Inject 1.2 mg into the skin daily.     . Multiple Vitamin (MULTIVITAMIN WITH MINERALS) TABS tablet Take 1 tablet by mouth daily.    . Vitamin D, Ergocalciferol, (DRISDOL) 50000 UNITS CAPS capsule Take 50,000 Units by mouth every 7 (seven) days.    . diazepam (VALIUM) 5 MG tablet Take 1 tablet (5 mg total) by mouth every 6 (six) hours as needed for muscle spasms. 10 tablet 0  . fluconazole (DIFLUCAN) 150 MG tablet TAKE 1 TABLET BY MOUTH (Patient not taking: Reported on 03/09/2014) 1 tablet 2    Allergies:  Allergies  Allergen Reactions  . Codeine Shortness Of Breath    Family History  Problem Relation Age of Onset  . Colon cancer Paternal Grandfather 11    colon    Social History:  reports that she has never smoked. She has never used smokeless tobacco. She reports that she does not drink alcohol or use illicit drugs.   ROS: As per HPI, all others negative   Blood pressure 166/82, pulse 71, temperature 97.8 F (36.6 C), temperature source Oral, resp. rate 14, SpO2 100 %. General appearance: overweight, nad GI: Mild generalized  tenderness Neurologic: Alert and oriented  No results found for this or any previous visit (from the past 48 hour(s)). No results found.  Assessment/Plan  1.  Gastric nodule.  Will assess with endoscopic ultrasound and possible biopsies (FNA).  Risks (bleeding, infection, bowel perforation that could require surgery, sedation-related changes in cardiopulmonary systems), benefits (identification and possible treatment of source of symptoms, exclusion of certain causes of symptoms), and alternatives (watchful waiting, radiographic imaging studies, empiric medical treatment) of upper endoscopy with ultrasound and biopsies (EUS +/- FNA) were explained to patient/family in detail and patient wishes to proceed.  Landry Dyke 03/23/2014, 9:28 AM

## 2014-03-23 NOTE — Op Note (Signed)
[facility] [place], [address]   ENDOSCOPIC ULTRASOUND PROCEDURE REPORT  PATIENT: Jasmine Martinez, Jasmine Martinez  MR#: [id number] BIRTHDATE: 1970-09-14  GENDER: female ENDOSCOPIST: Arta Silence, MD REFERRED BY:  Wilford Corner, M.D. PROCEDURE DATE:  03/23/2014 PROCEDURE:   Upper EUS w/FNA ASA CLASS:      Class III INDICATIONS:   1.  gastric nodule. MEDICATIONS: Monitored anesthesia care and Per Anesthesia  DESCRIPTION OF PROCEDURE:   After the risks benefits and alternatives of the procedure were  explained, informed consent was obtained. The patient was then placed in the left, lateral, decubitus postion and IV sedation was administered. Throughout the procedure, the patients blood pressure, pulse and oxygen saturations were monitored continuously.  Under direct visualization, the radial echoendoscope and linear echoendoscopes were sequentially introduced through the mouth  and advanced to the stomach antrum .  Water was used as necessary to provide an acoustic interface.  Upon completion of the imaging, water was removed and the patient was sent to the recovery room in satisfactory condition.    FINDINGS:      Submucosal-appearing nodule seen endoscopically in the proximal stomach.  The nodule had normal overlying mucosa. Upon EUS images, the lesion is hypoechoic and measures maximally 33mm x 53mm in size; lesion arises from the muscularis propria. The lesion has normal mucosa overlying it.  Linear scope used to FNA biopsy (22g, 5cc suction) the lesion x 2 (entire contents sent for cell block).  IMPRESSION:     As above.  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  RECOMMENDATIONS:     1.  Watch for potential complications of procedure. 2.  Await biopsy results. 3.  If no pathology worrisome for GIST is identified, would consider repeat EGD +/- EUS in 2 years. 4.  Will discuss  with Dr. Michail Sermon.   _______________________________ Lorrin MaisArta Silence, MD 03/23/2014 10:23 AM   CC:

## 2014-03-23 NOTE — Anesthesia Postprocedure Evaluation (Signed)
  Anesthesia Post-op Note  Patient: Jasmine Martinez  Procedure(s) Performed: Procedure(s) (LRB): ESOPHAGEAL ENDOSCOPIC ULTRASOUND (EUS) RADIAL (N/A) FINE NEEDLE ASPIRATION (FNA) LINEAR (N/A)  Patient Location: PACU  Anesthesia Type: MAC  Level of Consciousness: awake and alert   Airway and Oxygen Therapy: Patient Spontanous Breathing  Post-op Pain: mild  Post-op Assessment: Post-op Vital signs reviewed, Patient's Cardiovascular Status Stable, Respiratory Function Stable, Patent Airway and No signs of Nausea or vomiting  Last Vitals:  Filed Vitals:   03/23/14 1030  BP: 133/73  Pulse:   Temp:   Resp:     Post-op Vital Signs: stable   Complications: No apparent anesthesia complications

## 2014-03-23 NOTE — Anesthesia Preprocedure Evaluation (Signed)
Anesthesia Evaluation  Patient identified by MRN, date of birth, ID band Patient awake    Reviewed: Allergy & Precautions, H&P , NPO status , Patient's Chart, lab work & pertinent test results  Airway Mallampati: III  TM Distance: >3 FB Neck ROM: full    Dental no notable dental hx. (+) Teeth Intact, Dental Advisory Given   Pulmonary neg pulmonary ROS, sleep apnea ,  breath sounds clear to auscultation  Pulmonary exam normal       Cardiovascular Exercise Tolerance: Good hypertension, negative cardio ROS  Rhythm:regular Rate:Normal     Neuro/Psych negative neurological ROS  negative psych ROS   GI/Hepatic negative GI ROS, Neg liver ROS,   Endo/Other  negative endocrine ROSdiabetes, Well Controlled, Type 2, Oral Hypoglycemic AgentsMorbid obesity  Renal/GU negative Renal ROS  negative genitourinary   Musculoskeletal   Abdominal (+) + obese,   Peds  Hematology negative hematology ROS (+)   Anesthesia Other Findings   Reproductive/Obstetrics negative OB ROS                             Anesthesia Physical Anesthesia Plan  ASA: III  Anesthesia Plan: MAC   Post-op Pain Management:    Induction:   Airway Management Planned:   Additional Equipment:   Intra-op Plan:   Post-operative Plan:   Informed Consent: I have reviewed the patients History and Physical, chart, labs and discussed the procedure including the risks, benefits and alternatives for the proposed anesthesia with the patient or authorized representative who has indicated his/her understanding and acceptance.   Dental Advisory Given  Plan Discussed with: CRNA and Surgeon  Anesthesia Plan Comments:         Anesthesia Quick Evaluation

## 2014-03-24 ENCOUNTER — Encounter (HOSPITAL_COMMUNITY): Payer: Self-pay | Admitting: Gastroenterology

## 2014-05-05 ENCOUNTER — Other Ambulatory Visit: Payer: 59 | Admitting: Internal Medicine

## 2014-05-05 ENCOUNTER — Other Ambulatory Visit: Payer: Self-pay | Admitting: Internal Medicine

## 2014-05-05 DIAGNOSIS — Z1329 Encounter for screening for other suspected endocrine disorder: Secondary | ICD-10-CM

## 2014-05-05 DIAGNOSIS — Z130 Encounter for screening for diseases of the blood and blood-forming organs and certain disorders involving the immune mechanism: Secondary | ICD-10-CM

## 2014-05-05 DIAGNOSIS — Z0000 Encounter for general adult medical examination without abnormal findings: Secondary | ICD-10-CM

## 2014-05-05 DIAGNOSIS — E119 Type 2 diabetes mellitus without complications: Secondary | ICD-10-CM

## 2014-05-05 DIAGNOSIS — Z13220 Encounter for screening for lipoid disorders: Secondary | ICD-10-CM

## 2014-05-05 DIAGNOSIS — Z1321 Encounter for screening for nutritional disorder: Secondary | ICD-10-CM

## 2014-05-05 LAB — CBC WITH DIFFERENTIAL/PLATELET
Basophils Absolute: 0 10*3/uL (ref 0.0–0.1)
Basophils Relative: 0 % (ref 0–1)
EOS ABS: 0.1 10*3/uL (ref 0.0–0.7)
Eosinophils Relative: 1 % (ref 0–5)
HEMATOCRIT: 42.1 % (ref 36.0–46.0)
HEMOGLOBIN: 14.1 g/dL (ref 12.0–15.0)
Lymphocytes Relative: 20 % (ref 12–46)
Lymphs Abs: 2.2 10*3/uL (ref 0.7–4.0)
MCH: 30.5 pg (ref 26.0–34.0)
MCHC: 33.5 g/dL (ref 30.0–36.0)
MCV: 91.1 fL (ref 78.0–100.0)
MPV: 9.8 fL (ref 8.6–12.4)
Monocytes Absolute: 0.6 10*3/uL (ref 0.1–1.0)
Monocytes Relative: 6 % (ref 3–12)
NEUTROS ABS: 7.9 10*3/uL — AB (ref 1.7–7.7)
NEUTROS PCT: 73 % (ref 43–77)
Platelets: 349 10*3/uL (ref 150–400)
RBC: 4.62 MIL/uL (ref 3.87–5.11)
RDW: 12.6 % (ref 11.5–15.5)
WBC: 10.8 10*3/uL — ABNORMAL HIGH (ref 4.0–10.5)

## 2014-05-05 LAB — COMPLETE METABOLIC PANEL WITH GFR
ALBUMIN: 4 g/dL (ref 3.5–5.2)
ALT: 14 U/L (ref 0–35)
AST: 12 U/L (ref 0–37)
Alkaline Phosphatase: 109 U/L (ref 39–117)
BUN: 12 mg/dL (ref 6–23)
CHLORIDE: 103 meq/L (ref 96–112)
CO2: 24 meq/L (ref 19–32)
Calcium: 9.1 mg/dL (ref 8.4–10.5)
Creat: 0.65 mg/dL (ref 0.50–1.10)
GFR, Est Non African American: >89 mL/min
Glucose, Bld: 103 mg/dL — ABNORMAL HIGH (ref 70–99)
Potassium: 4.6 mEq/L (ref 3.5–5.3)
Sodium: 138 mEq/L (ref 135–145)
TOTAL PROTEIN: 7.3 g/dL (ref 6.0–8.3)
Total Bilirubin: 0.5 mg/dL (ref 0.2–1.2)

## 2014-05-05 LAB — HEMOGLOBIN A1C
Hgb A1c MFr Bld: 6.1 % — ABNORMAL HIGH (ref <5.7)
Mean Plasma Glucose: 128 mg/dL — ABNORMAL HIGH (ref <117)

## 2014-05-05 LAB — TSH: TSH: 2.053 u[IU]/mL (ref 0.350–4.500)

## 2014-05-05 LAB — LIPID PANEL
CHOL/HDL RATIO: 3.4 ratio
Cholesterol: 165 mg/dL (ref 0–200)
HDL: 48 mg/dL (ref >=46)
LDL Cholesterol: 101 mg/dL — ABNORMAL HIGH (ref 0–99)
Triglycerides: 81 mg/dL (ref <150)
VLDL: 16 mg/dL (ref 0–40)

## 2014-05-06 ENCOUNTER — Encounter: Payer: Self-pay | Admitting: Internal Medicine

## 2014-05-06 ENCOUNTER — Ambulatory Visit (INDEPENDENT_AMBULATORY_CARE_PROVIDER_SITE_OTHER): Payer: 59 | Admitting: Internal Medicine

## 2014-05-06 VITALS — BP 102/64 | HR 78 | Temp 98.2°F | Ht 65.0 in | Wt 258.0 lb

## 2014-05-06 DIAGNOSIS — D126 Benign neoplasm of colon, unspecified: Secondary | ICD-10-CM | POA: Diagnosis not present

## 2014-05-06 DIAGNOSIS — C49A2 Gastrointestinal stromal tumor of stomach: Secondary | ICD-10-CM

## 2014-05-06 DIAGNOSIS — F411 Generalized anxiety disorder: Secondary | ICD-10-CM

## 2014-05-06 DIAGNOSIS — E559 Vitamin D deficiency, unspecified: Secondary | ICD-10-CM | POA: Diagnosis not present

## 2014-05-06 DIAGNOSIS — Z01818 Encounter for other preprocedural examination: Secondary | ICD-10-CM

## 2014-05-06 DIAGNOSIS — E119 Type 2 diabetes mellitus without complications: Secondary | ICD-10-CM

## 2014-05-06 DIAGNOSIS — Z809 Family history of malignant neoplasm, unspecified: Secondary | ICD-10-CM

## 2014-05-06 DIAGNOSIS — Z0000 Encounter for general adult medical examination without abnormal findings: Secondary | ICD-10-CM | POA: Diagnosis not present

## 2014-05-06 DIAGNOSIS — D481 Neoplasm of uncertain behavior of connective and other soft tissue: Secondary | ICD-10-CM

## 2014-05-06 DIAGNOSIS — K529 Noninfective gastroenteritis and colitis, unspecified: Secondary | ICD-10-CM | POA: Diagnosis not present

## 2014-05-06 LAB — POCT URINALYSIS DIPSTICK
BILIRUBIN UA: NEGATIVE
Blood, UA: NEGATIVE
GLUCOSE UA: NEGATIVE
KETONES UA: NEGATIVE
Leukocytes, UA: NEGATIVE
Nitrite, UA: NEGATIVE
Protein, UA: NEGATIVE
Spec Grav, UA: 1.02
Urobilinogen, UA: NEGATIVE
pH, UA: 6.5

## 2014-05-06 LAB — VITAMIN D 25 HYDROXY (VIT D DEFICIENCY, FRACTURES): VIT D 25 HYDROXY: 25 ng/mL — AB (ref 30–100)

## 2014-05-06 MED ORDER — ALPRAZOLAM 0.5 MG PO TABS: 0.5000 mg | ORAL_TABLET | Freq: Every day | ORAL | Status: DC

## 2014-05-06 NOTE — Progress Notes (Signed)
Subjective:    Patient ID: Jasmine Martinez, female    DOB: [birth year], 44 y.o.   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 by Dr. Angelina Ok. Patient has a long-standing history of chronic diarrhea which apparently started after cholecystectomy. Has been evaluated by Dr. Michail Sermon at [facility]. Patient says colonoscopy did not reveal inflammatory bowel disease and celiac sprue testing was negative. 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 Duodenal biopsy showed no active inflammation. Stomach biopsy showed no evidence of he look of Bactrim pylori. Was thought to probably have irritable bowel syndrome. Has not been on a lactose-free diet yet. Patient developed some vague abdominal pain and was evaluated by Dr. Michail Sermon. Endoscopy showed a submucosal gastric lesion. She also had colonoscopy 02/28/2014 revealing a polyp that was removed. She then had endoscopic ultrasound showing a submucosal-appearing nodule in the proximal stomach measuring 1.5 x 1.2 cm. Pathology from fine needle aspiration revealed CD 117 and CD34 positive GIST.  She has a history of type 2 diabetes and is maintained on Victoza. History of vitamin D deficiency requiring 50,000 units weekly as a supplement. May have some absorption issues with chronic diarrhea. Has been on 50,000 units weekly for some time and cannot get vitamin D level up to normal. History of anxiety and depression treated with Prozac 20 mg daily.  Patient intolerant of lisinopril - it causes a cough. Codeine and hydrocodone cause shortness of breath  GYN is Dr. Philis Pique.  Has been diabetic for some 7 or 8 years. Hemoglobin A1c's are excellent. Last one November 2015 Work in the  With regard to depression, has been diagnosed with depression at least 4 times in her life. At one point was treated with Pristiq and is also tried  Wellbutrin.  Was evaluated for sleep apnea by Dr. Elsworth Soho. Sleep study was negative for obstructive sleep apnea.  Chronic diarrhea started since cholecystectomy.  Dr. Buddy Duty  has seen her regarding diabetes.  She has a Mirena IUD  Tetanus immunization done in 2009 through employment.  Appendectomy 2010, cholecystectomy 2005, tonsillectomy and adenoidectomy 2005, D&C 1997, D&C 2010, miscarriage 2007. Degenerative disc disease L4-S1.  Family history:  Mother with history of hypertension. Paternal great aunts and uncles with history of stomach and colon cancer. Paternal grandfather with history of lung cancer and was a smoker.  Paternal grandmother with history of brain cancer. Father in good health.   Maternal grandfather with history of lung cancer. Maternal grandmother with history of melanoma.  Has had allergy skin testing by Dr. Shaune Leeks April 2016 showing slight reactivity to dust mite and minimal reactivity to cat. Skin test to foods did not show IgE allergy. 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 Has EpiPen.  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 Has a Masters degree. She is married. Husband is disabled. One son 73 years old. Does not smoke or consume alcohol.    Review of Systems  Respiratory: Negative.        Snores but sleep apnea has been ruled out  Cardiovascular: Negative.   Gastrointestinal: Positive for diarrhea. Negative for vomiting.  Endocrine:       History of type 2 diabetes  Musculoskeletal: Negative.   Skin: Negative.   Allergic/Immunologic:       Bee sting and fire ant allergy  Neurological: Negative.   Hematological: Negative.  Psychiatric/Behavioral:       Anxiety depression       Objective:   Physical Exam  Constitutional: She is oriented to person, place, and time. She appears well-developed and well-nourished. No distress.  HENT:  Head: Normocephalic and atraumatic.  Right Ear: External ear  normal.  Left Ear: External ear normal.  Mouth/Throat: Oropharynx is clear and moist. No oropharyngeal exudate.  Eyes: Conjunctivae and EOM are normal. Pupils are equal, round, and reactive to light. Right eye exhibits no discharge. Left eye exhibits no discharge. No scleral icterus.  Neck: Neck supple. No JVD present. No thyromegaly present.  Cardiovascular: Normal rate, regular rhythm, normal heart sounds and intact distal pulses.   No murmur heard. Pulmonary/Chest: Effort normal and breath sounds normal. No respiratory distress. She has no wheezes. She has no rales.  Breasts normal female  Abdominal: Soft. Bowel sounds are normal. She exhibits no distension and no mass. There is no tenderness. There is no rebound and no guarding.  Genitourinary:  Deferred to GYN  Musculoskeletal: She exhibits no edema.  Lymphadenopathy:    She has no cervical adenopathy.  Neurological: She is alert and oriented to person, place, and time. She has normal reflexes. No cranial nerve deficit.  Skin: Skin is warm and dry. No rash noted. She is not diaphoretic.  Psychiatric: She has a normal mood and affect. Her behavior is normal. Judgment and thought content normal.  Vitals reviewed.         Assessment & Plan:  Chronic diarrhea for 10 years-possible irritable bowel syndrome. Seems to started after cholecystectomy. Needs to try lactose-free diet. May be candidate for Levsin or Bentyl  Vitamin D deficiency-takes large doses of vitamin D to keep level somewhat close to normal. Wonder about absorption of vitamin D  Type 2 diabetes mellitus control with Victoza  Anxiety depression treated with Prozac  GIST tumor-has been to [facility] to consider surgical resection  History of adenomatous colon polyp  Family history of cancer  Plan: Is to have preop chest x-ray. EKG shows borderline first degree AV block and an intraventricular conduction delay. Lab work reviewed with her today.  Patient intolerant of  lisinopril-it causes a cough. Blood pressure has been fine off of lisinopril. Currently not on another ACE or ARB

## 2014-05-06 NOTE — Patient Instructions (Signed)
Preop chest x-ray in the near future. Return in 3-6 months or sooner if necessary. Consider trial of Levsin for chronic diarrhea. For now continue 50,000 units vitamin D weekly

## 2014-05-07 LAB — MICROALBUMIN / CREATININE URINE RATIO
Creatinine, Urine: 86.8 mg/dL
MICROALB UR: 0.5 mg/dL (ref <2.0)
Microalb Creat Ratio: 5.8 mg/g (ref 0.0–30.0)

## 2014-05-10 ENCOUNTER — Telehealth: Payer: Self-pay | Admitting: *Deleted

## 2014-05-10 LAB — VITAMIN B12: VITAMIN B 12: 363 pg/mL (ref 211–911)

## 2014-05-10 NOTE — Telephone Encounter (Signed)
Reviewed labs with patient. °

## 2014-05-27 ENCOUNTER — Other Ambulatory Visit: Payer: Self-pay

## 2014-05-27 NOTE — Patient Outreach (Signed)
[facility]) Care Management  05/27/2014  Jasmine Martinez 02/07/1970 [id number]  Phone call to schedule appointment.  No answer and message left.  Plan to send appointment request letter. Appointment request letter sent. Peter Garter RN, [facility] Care Management Coordinator-Link to [place] Management [phone]

## 2014-05-31 ENCOUNTER — Telehealth: Payer: Self-pay | Admitting: *Deleted

## 2014-05-31 DIAGNOSIS — B373 Candidiasis of vulva and vagina: Secondary | ICD-10-CM

## 2014-05-31 DIAGNOSIS — B3731 Acute candidiasis of vulva and vagina: Secondary | ICD-10-CM

## 2014-05-31 MED ORDER — FLUCONAZOLE 150 MG PO TABS: 150.0000 mg | ORAL_TABLET | ORAL | Status: DC

## 2014-05-31 NOTE — Telephone Encounter (Signed)
Patient request treatment for yeast- symptoms- itching and discharge. Rx sent to pharmacywith refills per Dr Jodi Mourning permission.

## 2014-06-23 ENCOUNTER — Other Ambulatory Visit: Payer: Self-pay

## 2014-06-23 VITALS — BP 122/80 | HR 72 | Ht 65.0 in | Wt 267.6 lb

## 2014-06-23 DIAGNOSIS — E119 Type 2 diabetes mellitus without complications: Secondary | ICD-10-CM

## 2014-06-23 NOTE — Patient Outreach (Signed)
[facility]) Care Management   06/23/2014  Jasmine Martinez [birth year] [id number]  Jasmine Martinez is an 44 y.o. female  Member seen for follow up office visit for Link to Wellness program for self management of Type 2 diabetes  Subjective:  Member states that she has been diagnosed with a rare stomach cancer and she has been going to [facility] for treatment.  States that she has not been watching her diabetes during this time.  States that she has had one surgery for her tumor and she will have another surgery in July.  States that the cancer has been caught early and they hope it will not spread.  States she will be going every 3 months for follow up at [facility].  States that her appetite has decreased since her first surgery and there are only a few foods that are good to her.  States she has not been drinking enough fluids as sweet tea   Objective:   ROS  Physical Exam  Filed Vitals:   06/23/14 1044  BP: 122/80  Pulse: 72   Filed Weights   06/23/14 1044  Weight: 267 lb 9.6 oz (121.383 kg)    Current Medications:   Current Outpatient Prescriptions  Medication Sig Dispense Refill  . ALPRAZolam (XANAX) 0.5 MG tablet Take 1 tablet (0.5 mg total) by mouth at bedtime. 90 tablet 0  . FLUoxetine (PROZAC) 20 MG capsule Take 20 mg by mouth at bedtime.    Marland Kitchen levonorgestrel (MIRENA) 20 MCG/24HR IUD 1 each by Intrauterine route once.    . Liraglutide (VICTOZA) 18 MG/3ML SOLN Inject 1.8 mg into the skin daily.     . Vitamin D, Ergocalciferol, (DRISDOL) 50000 UNITS CAPS capsule Take 50,000 Units by mouth every 7 (seven) days.    Marland Kitchen aspirin EC 81 MG tablet Take 81 mg by mouth daily.    . calcium carbonate (OS-CAL) 600 MG TABS tablet Take by mouth.    . fluconazole (DIFLUCAN) 150 MG tablet Take 1 tablet (150 mg total) by mouth every other day. (Patient not taking: Reported on 06/23/2014) 3 tablet 2  . Multiple Vitamin (MULTIVITAMIN WITH MINERALS) TABS tablet Take 1 tablet by mouth  daily.     No current facility-administered medications for this visit.    Functional Status:   In your present state of health, do you have any difficulty performing the following activities: 06/23/2014  Hearing? N  Vision? N  Difficulty concentrating or making decisions? N  Walking or climbing stairs? N  Dressing or bathing? N  Doing errands, shopping? N    Fall/Depression Screening:    PHQ 2/9 Scores 06/23/2014  PHQ - 2 Score 2  PHQ- 9 Score 4   THN CM Care Plan Problem One        Patient Outreach from 06/23/2014 in [place] Problem One  Potential for elevated blood sugars related to dx of Type 2 DM   Care Plan for Problem One  Active   THN Long Term Goal (31-90 days)  Member will maintain hemoglobin A1c at or below 6.5 for the next 90 days   THN Long Term Goal Start Date  06/23/14   Interventions for Problem One Long Term Goal  Reinforced to limit CHO and portion sizes, Instructed to try to eat more protein with her meals and snacks as her GI status will allow, Discussed  importance of eating protein with her CHO to help keep her blood sugar steady,  Reviewed s/s of  hypoglycemia and actions to take, encouraged to start exercising regularly again      Assessment:   Member seen for Link to Wellness program for self management of Type 2 DM.  Member has increased her hemoglobin A1c from 5.8 to 6.1 since she was diagnosed with a gastrointestinal stromal tumor.  Currently she has been eating more CHO as this is what she can tolerate as her appetite has been effected after her surgery.  She has stopped exercising and has gained 23 lbs since last Link to Wellness visit.  She is having low blood sugars in the afternoon of 60-70 a few times a week when she reports she is not eating as much.  She is due to an annual eye exam.  Plan:  1. Plan to walk 3 times a week for 20 minutes.  Goal of 150 minutes a week 2. Plan to check blood sugar once a day fasting and 1  -2  hours after eating.  Goals of less than 100 fasting and less than 140 after eating. 3. Plan to eat protein with meals and snacks. 4. Plan to schedule eye exam call this week to schedule 5. Return to Link to Wellness on 09/22/14 at 10:30AM at [place] office

## 2014-06-24 NOTE — Patient Instructions (Signed)
1. Plan to walk 3 times a week for 20 minutes.  Goal of 150 minutes a week 2. Plan to check blood sugar once a day fasting and 1  -2 hours after eating.  Goals of less than 100 fasting and less than 140 after eating. 3. Plan to eat protein with meals and snacks. 4. Plan to schedule eye exam call this week to schedule 5. Return to Link to Wellness on 09/22/14 at 10:30AM at [place] office

## 2014-06-28 ENCOUNTER — Other Ambulatory Visit: Payer: Self-pay | Admitting: Certified Nurse Midwife

## 2014-06-28 DIAGNOSIS — G44201 Tension-type headache, unspecified, intractable: Secondary | ICD-10-CM

## 2014-06-28 MED ORDER — BUTALBITAL-APAP-CAFFEINE 50-325-40 MG PO TABS: 1.0000 | ORAL_TABLET | Freq: Four times a day (QID) | ORAL | Status: DC | PRN

## 2014-07-11 ENCOUNTER — Other Ambulatory Visit: Payer: Self-pay

## 2014-09-22 ENCOUNTER — Ambulatory Visit: Payer: Self-pay

## 2014-09-22 ENCOUNTER — Other Ambulatory Visit: Payer: Self-pay

## 2014-09-22 DIAGNOSIS — E119 Type 2 diabetes mellitus without complications: Secondary | ICD-10-CM

## 2014-09-22 NOTE — Patient Outreach (Signed)
[facility]) Care Management  09/22/2014  NEVAYAH FAUST 06/09/1970 [id number]  Phone call to member as she missed today's scheduled appointment.  States she did not remember getting reminder call. States she has been doing well and her procedure at [facility] went well.  States she is to go back at the end of the year for follow up.  States her blood sugars have been good for the last months with none over 120.    Plan Appointment rescheduled for 12/29/14  [facility] CM Care Plan Problem One        Most Recent Value   Care Plan Problem One  Potential for elevated blood sugars related to dx of Type 2 DM   Role Documenting the Problem One  Care Management [place] for Problem One  Active   THN Long Term Goal (31-90 days)  Member will maintain hemoglobin A1c at or below 6.5 for the next 90 days   THN Long Term Goal Start Date  09/22/14 Maylon Peppers has not had hemoglobin A1C checked since last MD visi]   Interventions for Problem One Long Term Goal  Reinforced to limit CHO and portion sizes, Instructed to try to eat more protein with her meals and snacks as her GI status will allow, Discussed  importance of eating protein with her CHO to help keep her blood sugar steady, Reviewed s/s of  hypoglycemia and actions to take, encouraged to start exercising regularly again     [place], [facility] Care Management Coordinator-Link to [place] Management [phone]

## 2014-09-23 ENCOUNTER — Other Ambulatory Visit: Payer: Self-pay | Admitting: Certified Nurse Midwife

## 2014-09-26 ENCOUNTER — Emergency Department (HOSPITAL_COMMUNITY)
Admission: EM | Admit: 2014-09-26 | Discharge: 2014-09-27 | Disposition: A | Discharge status: Home / Self Care | Payer: 59 | Attending: Emergency Medicine | Admitting: Emergency Medicine

## 2014-09-26 ENCOUNTER — Encounter (HOSPITAL_COMMUNITY): Payer: Self-pay | Admitting: Emergency Medicine

## 2014-09-26 ENCOUNTER — Emergency Department (HOSPITAL_COMMUNITY): Payer: 59

## 2014-09-26 DIAGNOSIS — Z79899 Other long term (current) drug therapy: Secondary | ICD-10-CM | POA: Diagnosis not present

## 2014-09-26 DIAGNOSIS — R51 Headache: Secondary | ICD-10-CM | POA: Diagnosis not present

## 2014-09-26 DIAGNOSIS — R079 Chest pain, unspecified: Secondary | ICD-10-CM | POA: Diagnosis present

## 2014-09-26 DIAGNOSIS — I10 Essential (primary) hypertension: Secondary | ICD-10-CM | POA: Diagnosis not present

## 2014-09-26 DIAGNOSIS — R110 Nausea: Secondary | ICD-10-CM | POA: Insufficient documentation

## 2014-09-26 DIAGNOSIS — E119 Type 2 diabetes mellitus without complications: Secondary | ICD-10-CM | POA: Diagnosis not present

## 2014-09-26 DIAGNOSIS — Z7982 Long term (current) use of aspirin: Secondary | ICD-10-CM | POA: Diagnosis not present

## 2014-09-26 DIAGNOSIS — E669 Obesity, unspecified: Secondary | ICD-10-CM | POA: Diagnosis not present

## 2014-09-26 LAB — CBC
HCT: 40 % (ref 36.0–46.0)
Hemoglobin: 13.6 g/dL (ref 12.0–15.0)
MCH: 30 pg (ref 26.0–34.0)
MCHC: 34 g/dL (ref 30.0–36.0)
MCV: 88.3 fL (ref 78.0–100.0)
Platelets: 323 10*3/uL (ref 150–400)
RBC: 4.53 MIL/uL (ref 3.87–5.11)
RDW: 12.5 % (ref 11.5–15.5)
WBC: 10.7 10*3/uL — ABNORMAL HIGH (ref 4.0–10.5)

## 2014-09-26 LAB — BASIC METABOLIC PANEL
ANION GAP: 9 (ref 5–15)
BUN: 7 mg/dL (ref 6–20)
CO2: 26 mmol/L (ref 22–32)
Calcium: 9.4 mg/dL (ref 8.9–10.3)
Chloride: 102 mmol/L (ref 101–111)
Creatinine, Ser: 0.52 mg/dL (ref 0.44–1.00)
GFR calc Af Amer: >60 mL/min (ref >60)
GLUCOSE: 99 mg/dL (ref 65–99)
Potassium: 3.7 mmol/L (ref 3.5–5.1)
SODIUM: 137 mmol/L (ref 135–145)

## 2014-09-26 LAB — I-STAT TROPONIN, ED: Troponin i, poc: 0.01 ng/mL (ref 0.00–0.08)

## 2014-09-26 NOTE — ED Notes (Signed)
Patient here with complaint of central chest pain with onset earlier this week. States that earlier this week it was intermittent and not as intense. Family member reports that yesterday patient was complaining of left axilliary pain. Presents tonight because pain is more intense, however still intermittent in nature.

## 2014-09-26 NOTE — ED Provider Notes (Signed)
CSN: [id number]     Arrival date & time 09/26/14  1954 History   This chart was scribed for Everlene Balls, MD by Randa Evens, ED Scribe. This patient was seen in room A07C/A07C and the patient's care was started at 11:30 PM.    Chief Complaint  Patient presents with  . Chest Pain   Patient is a 44 y.o. female presenting with chest pain. The history is provided by the patient. No language interpreter was used.  Chest Pain Pain location:  L chest Pain quality: dull   Pain radiates to the back: no   Pain severity:  Mild Timing:  Constant Progression:  Worsening Associated symptoms: headache and nausea   Associated symptoms: no diaphoresis, no lower extremity edema, no shortness of breath and not vomiting    HPI Comments: Jasmine Martinez is a 44 y.o. female who presents to the Emergency Department complaining of dull worsening intermittent left sided CP onset several days prior. Pt states that the pain recently worsened today to an intense constant pressure. Pt reports intermittent HA and nausea as well. Pt doesn't report any worsening factors. Pt states that the pain radiated to her left arm 2 days prior. Pt denies any radiating pain today. Pt denies any recent sick contacts. Denies any recent surgery. Pt report endoscopic surgery July 2016. Pt denies Hx of DVT or PE's. Denies vomiting, diaphoresis, SOB or leg swelling. Pt states that she does have an cardiology appointment coming soon.   Past Medical History  Diagnosis Date  . Diabetes mellitus   . Hyperlipidemia   . Obesity   . HTN (hypertension)    Past Surgical History  Procedure Laterality Date  . Appendectomy    . Cholecystectomy    . Tonsillectomy    . Eus N/A 03/23/2014    Procedure: ESOPHAGEAL ENDOSCOPIC ULTRASOUND (EUS) RADIAL;  Surgeon: Arta Silence, MD;  Location: [facility] ENDOSCOPY;  Service: Endoscopy;  Laterality: N/A;  . Fine needle aspiration N/A 03/23/2014    Procedure: FINE NEEDLE ASPIRATION (FNA) LINEAR;  Surgeon:  Arta Silence, MD;  Location: [facility] ENDOSCOPY;  Service: Endoscopy;  Laterality: N/A;   Family History  Problem Relation Age of Onset  . Colon cancer Paternal Grandfather 70    colon   Social History  Substance Use Topics  . Smoking status: Never Smoker   . Smokeless tobacco: Never Used  . Alcohol Use: No   OB History    Gravida Para Term Preterm AB TAB SAB Ectopic Multiple Living   4 1             Review of Systems  Constitutional: Negative for diaphoresis.  Respiratory: Negative for shortness of breath.   Cardiovascular: Positive for chest pain. Negative for leg swelling.  Gastrointestinal: Positive for nausea. Negative for vomiting.  Neurological: Positive for headaches.  All other systems reviewed and are negative.    Allergies  Codeine and Hydrocodone  Home Medications   Prior to Admission medications   Medication Sig Start Date End Date Taking? Authorizing Provider  ALPRAZolam Duanne Moron) 0.5 MG tablet Take 1 tablet (0.5 mg total) by mouth at bedtime. 05/06/14  Yes Elby Showers, MD  aspirin EC 81 MG tablet Take 81 mg by mouth daily.   Yes Historical Provider, MD  butalbital-acetaminophen-caffeine (FIORICET, ESGIC) 50-325-40 MG per tablet TAKE 1-2 TABLETS BY MOUTH EVERY 6 HOURS AS NEEDED FOR HEADACHE 09/23/14  Yes Shelly Bombard, MD  calcium carbonate (OS-CAL) 600 MG TABS tablet Take 600 mg by mouth  daily with breakfast.    Yes Historical Provider, MD  FLUoxetine (PROZAC) 20 MG capsule Take 20 mg by mouth at bedtime.   Yes Historical Provider, MD  levonorgestrel (MIRENA) 20 MCG/24HR IUD 1 each by Intrauterine route once.   Yes Historical Provider, MD  Multiple Vitamin (MULTIVITAMIN WITH MINERALS) TABS tablet Take 1 tablet by mouth daily.   Yes Historical Provider, MD  omeprazole (PRILOSEC) 20 MG capsule Take 20 mg by mouth 2 (two) times daily.   Yes Historical Provider, MD  VICTOZA 18 MG/3ML SOPN Inject 1.8 mg into the skin daily. 09/23/14  Yes Historical Provider, MD   Vitamin D, Ergocalciferol, (DRISDOL) 50000 UNITS CAPS capsule Take 50,000 Units by mouth every 7 (seven) days.   Yes Historical Provider, MD   BP 135/68 mmHg  Pulse 78  Temp(Src) 97.8 F (36.6 C) (Oral)  Resp 16  Ht 5\' 5"  (1.651 m)  Wt 270 lb (122.471 kg)  BMI 44.93 kg/m2  SpO2 100%   Physical Exam  Constitutional: She is oriented to person, place, and time. She appears well-developed and well-nourished. No distress.  HENT:  Head: Normocephalic and atraumatic.  Nose: Nose normal.  Mouth/Throat: Oropharynx is clear and moist. No oropharyngeal exudate.  Eyes: Conjunctivae and EOM are normal. Pupils are equal, round, and reactive to light. No scleral icterus.  Neck: Normal range of motion. Neck supple. No JVD present. No tracheal deviation present. No thyromegaly present.  Cardiovascular: Normal rate, regular rhythm and normal heart sounds.  Exam reveals no gallop and no friction rub.   No murmur heard. Pulmonary/Chest: Effort normal and breath sounds normal. No respiratory distress. She has no wheezes. She exhibits no tenderness.  Abdominal: Soft. Bowel sounds are normal. She exhibits no distension and no mass. There is no tenderness. There is no rebound and no guarding.  Musculoskeletal: Normal range of motion. She exhibits no edema or tenderness.  Lymphadenopathy:    She has no cervical adenopathy.  Neurological: She is alert and oriented to person, place, and time. No cranial nerve deficit. She exhibits normal muscle tone.  Skin: Skin is warm and dry. No rash noted. No erythema. No pallor.  Nursing note and vitals reviewed.   ED Course  Procedures (including critical care time) DIAGNOSTIC STUDIES: Oxygen Saturation is 100% on RA, normal by my interpretation.    COORDINATION OF CARE: 11:47 PM-Discussed treatment plan with pt at bedside and pt agreed to plan.     Labs Review Labs Reviewed  CBC - Abnormal; Notable for the following:    WBC 10.7 (*)    All other  components within normal limits  BASIC METABOLIC PANEL  TROPONIN I  Randolm Idol, ED    Imaging Review Dg Chest 2 View  09/26/2014   CLINICAL DATA:  Complaint of central chest pain. Onset earlier this week. Question left axillary pain.  EXAM: CHEST  2 VIEW  COMPARISON:  None.  FINDINGS: The heart size and mediastinal contours are within normal limits. Both lungs are clear. The visualized skeletal structures are unremarkable. Surgical clips are noted in the upper abdomen.  IMPRESSION: No active cardiopulmonary disease.   Electronically Signed   By: Nolon Nations M.D.   On: 09/26/2014 20:56     EKG Interpretation   Date/Time:  Tuesday September 27 2014 00:06:42 EDT Ventricular Rate:  66 PR Interval:  159 QRS Duration: 91 QT Interval:  415 QTC Calculation: 435 R Axis:   -51 Text Interpretation:  Sinus rhythm Left anterior fascicular block  Nonspecific T wave abnormality in lead III Confirmed by Glynn Octave [id number]) on 09/27/2014 12:10:32 AM      MDM   Final diagnoses:  None   Patient presents emergency department for chest pain. Her history is not consistent with ACS however the intensity did increase over the last 24 hours. Patient was evaluated in the emergency department with 2 sets of troponins and the heart score. Patient also had 3 serial EKGs which only show nonspecific T wave abnormality in lead 3. There are no other changes. Troponin is negative 2. Heart score is 1.  She is PERC negative.  She appears well and in no acute distress. Her chest pain is back down to its baseline. She does have cardiology follow-up. Return precautions given. Her vital signs remain within her normal limits and she is safe for discharge.  I personally performed the services described in this documentation, which was scribed in my presence. The recorded information has been reviewed and is accurate.    Everlene Balls, MD 09/27/14 [phone]

## 2014-09-27 ENCOUNTER — Encounter: Payer: Self-pay | Admitting: Cardiology

## 2014-09-27 LAB — TROPONIN I

## 2014-09-27 NOTE — Discharge Instructions (Signed)
Chest Pain (Nonspecific) Jasmine Martinez, Your chest pain work up today is negative.  See your cardiologist within 3 days for close follow up. If symptoms worsen come back to the ED immediately.  Thank you. It is often hard to give a diagnosis for the cause of chest pain. There is always a chance that your pain could be related to something serious, such as a heart attack or a blood clot in the lungs. You need to follow up with your doctor. HOME CARE  If antibiotic medicine was given, take it as directed by your doctor. Finish the medicine even if you start to feel better.  For the next few days, avoid activities that bring on chest pain. Continue physical activities as told by your doctor.  Do not use any tobacco products. This includes cigarettes, chewing tobacco, and e-cigarettes.  Avoid drinking alcohol.  Only take medicine as told by your doctor.  Follow your doctor's suggestions for more testing if your chest pain does not go away.  Keep all doctor visits you made. GET HELP IF:  Your chest pain does not go away, even after treatment.  You have a rash with blisters on your chest.  You have a fever. GET HELP RIGHT AWAY IF:   You have more pain or pain that spreads to your arm, neck, jaw, back, or belly (abdomen).  You have shortness of breath.  You cough more than usual or cough up blood.  You have very bad back or belly pain.  You feel sick to your stomach (nauseous) or throw up (vomit).  You have very bad weakness.  You pass out (faint).  You have chills. This is an emergency. Do not wait to see if the problems will go away. Call your local emergency services (911 in [place] Do not drive yourself to the hospital. MAKE SURE YOU:   Understand these instructions.  Will watch your condition.  Will get help right away if you are not doing well or get worse. Document Released: 06/19/2007 Document Revised: 01/05/2013 Document Reviewed: 06/19/2007 [facility] Patient  Information 2015 [place], [place]. This information is not intended to replace advice given to you by your health care provider. Make sure you discuss any questions you have with your health care provider.

## 2014-09-29 ENCOUNTER — Telehealth: Payer: Self-pay | Admitting: Cardiology

## 2014-09-29 NOTE — Telephone Encounter (Signed)
Closed encounter °

## 2014-09-30 ENCOUNTER — Other Ambulatory Visit: Payer: Self-pay | Admitting: Certified Nurse Midwife

## 2014-10-03 ENCOUNTER — Encounter: Payer: Self-pay | Admitting: Cardiology

## 2014-10-03 ENCOUNTER — Ambulatory Visit (INDEPENDENT_AMBULATORY_CARE_PROVIDER_SITE_OTHER): Payer: 59 | Admitting: Cardiology

## 2014-10-03 VITALS — BP 130/72 | HR 79 | Ht 65.0 in | Wt 274.4 lb

## 2014-10-03 DIAGNOSIS — E119 Type 2 diabetes mellitus without complications: Secondary | ICD-10-CM | POA: Diagnosis not present

## 2014-10-03 DIAGNOSIS — R0789 Other chest pain: Secondary | ICD-10-CM | POA: Diagnosis not present

## 2014-10-03 DIAGNOSIS — C49A0 Gastrointestinal stromal tumor, unspecified site: Secondary | ICD-10-CM

## 2014-10-03 DIAGNOSIS — E559 Vitamin D deficiency, unspecified: Secondary | ICD-10-CM | POA: Diagnosis not present

## 2014-10-03 DIAGNOSIS — D481 Neoplasm of uncertain behavior of connective and other soft tissue: Secondary | ICD-10-CM

## 2014-10-03 NOTE — Patient Instructions (Signed)
Your physician has requested that you have an exercise tolerance test. For further information please visit [email or web address]. Please also follow instruction sheet, as given.  Your physician recommends that you keep follow up appointment with Dr. Percival Spanish.

## 2014-10-03 NOTE — Progress Notes (Signed)
Cardiology Office Note   Date:  10/03/2014   ID:  SIBYL Martinez, DOB [birth year], MRN [id number]  PCP:  Elby Showers, MD  Cardiologist:  Dr. Percival Spanish     Chief Complaint  Patient presents with  . Hospitalization Follow-up    have chest pain in the mornings, any type of stress makes it worse      History of Present Illness: Jasmine Martinez is a 44 y.o. female who presents for ER follow up.  Was seen 09/26/14 for chest pain in ED.  She was complaining of dull worsening intermittent left sided CP onset several days prior. Pt stated that the pain recently worsened today to an intense constant pressure which led to her ER visit.  Pt reported intermittent HA and nausea as well. Pt states that the pain radiated to her left arm 2 days prior.  Since discharge continues with chest pain in the mornings. No pain with daily activities, only when she first gets up.  No associated SOB or nausea.  She is also having headaches.    She is no longer on prilosec.  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 She has had follow up at [facility].     Troponin was negative, EKG without acute changes.   Has been seen by Dr. Percival Spanish for chest pain felt to be GI. Other hx sleep study was neg for sleep apnea.   Cardiac risk factors include dm-2, obesity, hx hyperlipidemia.  No family hx CAD. She also has hx of taking phen-fen.  Past Medical History  Diagnosis Date  . Diabetes mellitus   . Hyperlipidemia   . Obesity   . HTN (hypertension)   . Gastrointestinal stromal tumor (GIST)     removed 2016    Past Surgical History  Procedure Laterality Date  . Appendectomy    . Cholecystectomy    . Tonsillectomy    . Eus N/A 03/23/2014    Procedure: ESOPHAGEAL ENDOSCOPIC ULTRASOUND (EUS) RADIAL;  Surgeon: Arta Silence, MD;  Location: [facility] END[facility];  Service: Endoscopy;  Laterality: N/A;  . Fine needle aspiration N/A 03/23/2014    Procedure: FINE NEEDLE ASPIRATION (FNA) LINEAR;   Surgeon: Arta Silence, MD;  Location: [facility];  Service: Endoscopy;  Laterality: N/A;     Current Outpatient Prescriptions  Medication Sig Dispense Refill  . ALPRAZolam (XANAX) 0.5 MG tablet Take 1 tablet (0.5 mg total) by mouth at bedtime. 90 tablet 0  . aspirin EC 81 MG tablet Take 81 mg by mouth daily.    . butalbital-acetaminophen-caffeine (FIORICET, ESGIC) 50-325-40 MG per tablet TAKE 1-2 TABLETS BY MOUTH EVERY 6 HOURS AS NEEDED FOR HEADACHE 20 tablet 0  . calcium carbonate (OS-CAL) 600 MG TABS tablet Take 600 mg by mouth daily with breakfast.     . FLUoxetine (PROZAC) 20 MG capsule Take 20 mg by mouth at bedtime.    Marland Kitchen levonorgestrel (MIRENA) 20 MCG/24HR IUD 1 each by Intrauterine route once.    . Multiple Vitamin (MULTIVITAMIN WITH MINERALS) TABS tablet Take 1 tablet by mouth daily.    Marland Kitchen NOVOFINE 32G X 6 MM MISC Inject 1 Stick as directed daily.  5  . omeprazole (PRILOSEC) 20 MG capsule Take 20 mg by mouth 2 (two) times daily.    . TRUE METRIX BLOOD GLUCOSE TEST test strip Apply 1 strip topically daily.  3  . TRUEPLUS LANCETS 30G MISC Inject 1 Stick as directed daily.  6  . VICTOZA 18 MG/3ML  SOPN Inject 1.8 mg into the skin daily.  5  . Vitamin D, Ergocalciferol, (DRISDOL) 50000 UNITS CAPS capsule Take 50,000 Units by mouth every 7 (seven) days.     No current facility-administered medications for this visit.    Allergies:   Codeine and Hydrocodone    Social History:  The patient  reports that she has never smoked. She has never used smokeless tobacco. She reports that she does not drink alcohol or use illicit drugs.   Family History:  The patient's family history includes Colon cancer (age of onset: 17) in her paternal grandfather; Healthy in her father and mother.    ROS:  General:no colds or fevers, no weight changes ( she had lost 60 lbs but with GIST her wt increased and now back up.) Skin:no rashes or ulcers HEENT:no blurred vision, no congestion CV:see  HPI PUL:see HPI-  She is SOB with climbing stairs which has occurred with wt gain.  GI:no diarrhea constipation or melena, no indigestion GU:no hematuria, no dysuria MS:no joint pain, no claudication Neuro:no syncope, no lightheadedness Endo:+ diabetes, no thyroid disease  Wt Readings from Last 3 Encounters:  10/03/14 274 lb 6.4 oz (124.467 kg)  09/26/14 270 lb (122.471 kg)  06/23/14 267 lb 9.6 oz (121.383 kg)     PHYSICAL EXAM: VS:  BP 130/72 mmHg  Pulse 79  Ht 5\' 5"  (1.651 m)  Wt 274 lb 6.4 oz (124.467 kg)  BMI 45.66 kg/m2 , BMI Body mass index is 45.66 kg/(m^2). General:Pleasant affect, NAD Skin:Warm and dry, brisk capillary refill HEENT:normocephalic, sclera clear, mucus membranes moist Neck:supple, no JVD, no bruits  Heart:S1S2 RRR without murmur, gallup, rub or click Lungs:clear without rales, rhonchi, or wheezes VEL:FYBOF, soft, non tender, + BS, do not palpate liver spleen or masses Ext:no lower ext edema, 2+ pedal pulses, 2+ radial pulses Neuro:alert and oriented X 3, MAE, follows commands, + facial symmetry    EKG:  EKG is ordered today. The ekg ordered today demonstrates SR rate 79 with LAFB.    Recent Labs: 05/05/2014: ALT 14; TSH 2.053 09/26/2014: BUN 7; Creatinine, Ser 0.52; Hemoglobin 13.6; Platelets 323; Potassium 3.7; Sodium 137    Lipid Panel    Component Value Date/Time   CHOL 165 05/05/2014 0902   TRIG 81 05/05/2014 0902   HDL 48 05/05/2014 0902   CHOLHDL 3.4 05/05/2014 0902   VLDL 16 05/05/2014 0902   LDLCALC 101* 05/05/2014 0902       Other studies Reviewed: Additional studies/ records that were reviewed today include: GI notes, previous cardiac notes. .   ASSESSMENT AND PLAN:  1.  Chest pain  ETT - just regular stress test.  If amnormal with review with Dr. Vita Barley and plan myoview or cath.  For her dyspnea climbing stairs in combination of Phen -fen use may want to do echo but will see how ETT is.  No lower ext edema. She has  follow up with Dr. Vita Barley in Oct..  In the meantime I did ask her to resume prilosec daily to see if some reflux.    2. DM-2- followed by PCP      Current medicines are reviewed with the patient today.  The patient Has no concerns regarding medicines.  The following changes have been made:  See above Labs/ tests ordered today include:see above  Disposition:   FU:  see above  Signed, Isaiah Serge, NP  10/03/2014 1:38 PM    [facility] [address]  [address] [place], [place], [place] Phone: [phone]; Fax: [phone]

## 2014-10-04 ENCOUNTER — Encounter: Payer: Self-pay | Admitting: Physician Assistant

## 2014-10-04 ENCOUNTER — Ambulatory Visit (HOSPITAL_COMMUNITY)
Admission: RE | Admit: 2014-10-04 | Discharge: 2014-10-04 | Disposition: A | Discharge status: Home / Self Care | Payer: 59 | Source: Ambulatory Visit | Attending: Cardiology | Admitting: Cardiology

## 2014-10-04 DIAGNOSIS — R0789 Other chest pain: Secondary | ICD-10-CM | POA: Diagnosis present

## 2014-10-04 NOTE — Progress Notes (Signed)
GXT treadmill performed. Target HR reached early in stage 2 but BP too high to continue at 247/117, test ended. No chest pain.  Rosaria Ferries, Hershal Coria 10/04/2014 1:27 PM Beeper [phone]

## 2014-10-12 ENCOUNTER — Encounter: Payer: Self-pay | Admitting: Cardiology

## 2014-10-28 ENCOUNTER — Ambulatory Visit: Payer: 59 | Admitting: Internal Medicine

## 2014-11-01 ENCOUNTER — Encounter: Payer: Self-pay | Admitting: Cardiology

## 2014-11-01 ENCOUNTER — Ambulatory Visit (INDEPENDENT_AMBULATORY_CARE_PROVIDER_SITE_OTHER): Payer: 59 | Admitting: Cardiology

## 2014-11-01 VITALS — BP 130/82 | HR 80 | Ht 65.0 in | Wt 260.0 lb

## 2014-11-01 DIAGNOSIS — R0600 Dyspnea, unspecified: Secondary | ICD-10-CM | POA: Diagnosis not present

## 2014-11-01 DIAGNOSIS — I10 Essential (primary) hypertension: Secondary | ICD-10-CM

## 2014-11-01 NOTE — Patient Instructions (Signed)
Your physician wants you to follow-up in: 2 Weeks with Dr Percival Spanish. You will receive a reminder letter in the mail two months in advance. If you don't receive a letter, please call our office to schedule the follow-up appointment.  Your physician recommends that you return for lab work in: BNP  Your Physician recommends that you wear a 24 Hour Blood Pressure Monitor  Your physician has recommended that you have a pulmonary function test. Pulmonary Function Tests are a group of tests that measure how well air moves in and out of your lungs.

## 2014-11-01 NOTE — Progress Notes (Signed)
Cardiology Office Note   Date:  11/01/2014   ID:  Jasmine Martinez, DOB 18-Aug-1970, MRN [id number]  PCP:  Elby Showers, MD  Cardiologist:   Minus Breeding, MD   Chief Complaint  Patient presents with  . Shortness of Breath      History of Present Illness: Jasmine Martinez is a 44 y.o. female who presents for follow with chest pain. She was in the hospital emergency room in mid September with some chest discomfort. There was no objective evidence of ischemia. She was referred to our office and did have a POET (Plain Old Exercise Treadmill).  I personally review these results today and there was no evidence of ischemia. However, she had a hypertensive blood pressure response with a peak systolic of 409.  She continues to get chest discomfort. This happens mostly in the morning. Is not associated with food. Is not positional. It happens in her upper chest. It is somewhat heavy. She also has significant shortness of breath with activities. This has progressed despite the fact that her weight is stable. She's not having any resting shortness of breath. She's not having any PND or orthopnea. He does not have palpitations, presyncope or syncope.    Past Medical History  Diagnosis Date  . Diabetes mellitus   . Hyperlipidemia   . Obesity   . HTN (hypertension)   . Gastrointestinal stromal tumor (GIST)     removed 2016    Past Surgical History  Procedure Laterality Date  . Appendectomy    . Cholecystectomy    . Tonsillectomy    . Eus N/A 03/23/2014    Procedure: ESOPHAGEAL ENDOSCOPIC ULTRASOUND (EUS) RADIAL;  Surgeon: Arta Silence, MD;  Location: [facility];  Service: Endoscopy;  Laterality: N/A;  . Fine needle aspiration N/A 03/23/2014    Procedure: FINE NEEDLE ASPIRATION (FNA) LINEAR;  Surgeon: Arta Silence, MD;  Location: [facility];  Service: Endoscopy;  Laterality: N/A;     Current Outpatient Prescriptions  Medication Sig Dispense Refill  . ALPRAZolam (XANAX)  0.5 MG tablet Take 1 tablet (0.5 mg total) by mouth at bedtime. 90 tablet 0  . aspirin EC 81 MG tablet Take 81 mg by mouth daily.    . butalbital-acetaminophen-caffeine (FIORICET, ESGIC) 50-325-40 MG per tablet TAKE 1-2 TABLETS BY MOUTH EVERY 6 HOURS AS NEEDED FOR HEADACHE 20 tablet 0  . FLUoxetine (PROZAC) 20 MG capsule Take 20 mg by mouth at bedtime.    Marland Kitchen levonorgestrel (MIRENA) 20 MCG/24HR IUD 1 each by Intrauterine route once.    . Multiple Vitamin (MULTIVITAMIN WITH MINERALS) TABS tablet Take 1 tablet by mouth daily.    Marland Kitchen NOVOFINE 32G X 6 MM MISC Inject 1 Stick as directed daily.  5  . omeprazole (PRILOSEC) 20 MG capsule Take 20 mg by mouth 2 (two) times daily.    . TRUE METRIX BLOOD GLUCOSE TEST test strip Apply 1 strip topically daily.  3  . TRUEPLUS LANCETS 30G MISC Inject 1 Stick as directed daily.  6  . VICTOZA 18 MG/3ML SOPN Inject 1.8 mg into the skin daily.  5  . Vitamin D, Ergocalciferol, (DRISDOL) 50000 UNITS CAPS capsule Take 50,000 Units by mouth every 7 (seven) days.     No current facility-administered medications for this visit.    Allergies:   Codeine and Hydrocodone    ROS:  Please see the history of present illness.   Otherwise, review of systems are positive for none.   All other systems are reviewed  and negative.    PHYSICAL EXAM: VS:  BP 130/82 mmHg  Pulse 80  Ht 5\' 5"  (1.651 m)  Wt 260 lb (117.935 kg)  BMI 43.27 kg/m2 , BMI Body mass index is 43.27 kg/(m^2). GENERAL:  Well appearing NECK:  No jugular venous distention, waveform within normal limits, carotid upstroke brisk and symmetric, no bruits, no thyromegaly LUNGS:  Clear to auscultation bilaterally BACK:  No CVA tenderness CHEST:  Unremarkable HEART:  PMI not displaced or sustained,S1 and S2 within normal limits, no S3, no S4, no clicks, no rubs, no murmurs ABD:  Flat, positive bowel sounds normal in frequency in pitch, no bruits, no rebound, no guarding, no midline pulsatile mass, no hepatomegaly, no  splenomegaly EXT:  2 plus pulses throughout, no edema, no cyanosis no clubbing   EKG:  EKG is  ordered today. Sinus rhythm, rate 80, axis within normal limits, intervals within normal limits, no acute ST-T wave changes.   Recent Labs: 05/05/2014: ALT 14; TSH 2.053 09/26/2014: BUN 7; Creatinine, Ser 0.52; Hemoglobin 13.6; Platelets 323; Potassium 3.7; Sodium 137    Lipid Panel    Component Value Date/Time   CHOL 165 05/05/2014 0902   TRIG 81 05/05/2014 0902   HDL 48 05/05/2014 0902   CHOLHDL 3.4 05/05/2014 0902   VLDL 16 05/05/2014 0902   LDLCALC 101* 05/05/2014 0902      Wt Readings from Last 3 Encounters:  11/01/14 260 lb (117.935 kg)  10/03/14 274 lb 6.4 oz (124.467 kg)  09/26/14 270 lb (122.471 kg)      Other studies Reviewed: Additional studies/ records that were reviewed today include: POET (Plain Old Exercise Treadmill). Review of the above records demonstrates:  Please see elsewhere in the note.     ASSESSMENT AND PLAN:  CHEST PAIN:  The patient continues to have chest discomfort.   I would suspect something more related to a GI cause.  She has recently been diagnosed with a stromal tumor. I asked her to discuss this with her gastroenterologist.  DYSPNEA:  I will start this evaluation with a BNP level and pulmonary function testing.  HTN:  She had very significant hypertensive response with exercise. I will start with a 24-hour blood pressure monitor because she says her blood pressure is very labile and normal frequently. I suspect she has more retention and she realizes.   Current medicines are reviewed at length with the patient today.  The patient does not have concerns regarding medicines.  The following changes have been made:  no change  Labs/ tests ordered today include:  No orders of the defined types were placed in this encounter.     Disposition:   FU with me after the above testing.     Signed, Minus Breeding, MD  11/01/2014 3:58 PM      [place]

## 2014-11-02 ENCOUNTER — Encounter: Payer: Self-pay | Admitting: *Deleted

## 2014-11-02 ENCOUNTER — Ambulatory Visit (INDEPENDENT_AMBULATORY_CARE_PROVIDER_SITE_OTHER): Payer: 59

## 2014-11-02 DIAGNOSIS — I10 Essential (primary) hypertension: Secondary | ICD-10-CM | POA: Diagnosis not present

## 2014-11-02 LAB — BRAIN NATRIURETIC PEPTIDE: Brain Natriuretic Peptide: 2.8 pg/mL (ref 0.0–100.0)

## 2014-11-02 NOTE — Progress Notes (Signed)
Patient ID: Jasmine Martinez, female   DOB: Aug 04, 1970, 44 y.o.   MRN: [id number] 24 hour ambulatory blood pressure monitor applied to patient.

## 2014-11-03 ENCOUNTER — Ambulatory Visit (HOSPITAL_COMMUNITY)
Admission: RE | Admit: 2014-11-03 | Discharge: 2014-11-03 | Disposition: A | Discharge status: Home / Self Care | Payer: 59 | Source: Ambulatory Visit | Attending: Cardiology | Admitting: Cardiology

## 2014-11-03 DIAGNOSIS — R0600 Dyspnea, unspecified: Secondary | ICD-10-CM | POA: Diagnosis present

## 2014-11-03 LAB — PULMONARY FUNCTION TEST
DL/VA % PRED: 90 %
DL/VA: 4.46 ml/min/mmHg/L
DLCO UNC: 22.44 ml/min/mmHg
DLCO unc % pred: 87 %
FEF 25-75 POST: 4.36 L/s
FEF 25-75 Pre: 3.55 L/sec
FEF2575-%Change-Post: 22 %
FEF2575-%PRED-POST: 140 %
FEF2575-%PRED-PRE: 114 %
FEV1-%Change-Post: 3 %
FEV1-%PRED-PRE: 102 %
FEV1-%Pred-Post: 106 %
FEV1-PRE: 3.14 L
FEV1-Post: 3.27 L
FEV1FVC-%CHANGE-POST: 3 %
FEV1FVC-%PRED-PRE: 102 %
FEV6-%Change-Post: 0 %
FEV6-%Pred-Post: 100 %
FEV6-%Pred-Pre: 100 %
FEV6-Post: 3.72 L
FEV6-Pre: 3.73 L
FEV6FVC-%Pred-Post: 102 %
FEV6FVC-%Pred-Pre: 102 %
FVC-%Change-Post: 0 %
FVC-%PRED-POST: 98 %
FVC-%PRED-PRE: 98 %
FVC-POST: 3.72 L
FVC-PRE: 3.73 L
PRE FEV1/FVC RATIO: 84 %
Post FEV1/FVC ratio: 88 %
Post FEV6/FVC ratio: 100 %
Pre FEV6/FVC Ratio: 100 %
RV % pred: 81 %
RV: 1.37 L
TLC % pred: 101 %
TLC: 5.26 L

## 2014-11-03 MED ORDER — ALBUTEROL SULFATE (2.5 MG/3ML) 0.083% IN NEBU
2.5000 mg | INHALATION_SOLUTION | Freq: Once | RESPIRATORY_TRACT | Status: AC
  Administered 2014-11-03: 2.5 mg via RESPIRATORY_TRACT

## 2014-11-15 ENCOUNTER — Ambulatory Visit (INDEPENDENT_AMBULATORY_CARE_PROVIDER_SITE_OTHER): Payer: 59 | Admitting: Cardiology

## 2014-11-15 ENCOUNTER — Encounter: Payer: Self-pay | Admitting: Cardiology

## 2014-11-15 VITALS — BP 134/86 | HR 78 | Ht 65.0 in | Wt 270.5 lb

## 2014-11-15 DIAGNOSIS — I10 Essential (primary) hypertension: Secondary | ICD-10-CM

## 2014-11-15 NOTE — Progress Notes (Signed)
Cardiology Office Note   Date:  11/15/2014   ID:  Jasmine Martinez, DOB 12/17/1970, MRN [id number]  PCP:  Elby Showers, MD  Cardiologist:   Minus Breeding, MD   No chief complaint on file.     History of Present Illness: Jasmine Martinez is a 44 y.o. female who presents for follow with chest pain. She was in the hospital emergency room in mid September with some chest discomfort. There was no objective evidence of ischemia. She was referred to our office and did have a POET (Plain Old Exercise Treadmill).  I personally review these results today and there was no evidence of ischemia. However, she had a hypertensive blood pressure response with a peak systolic of 409.  I had her wear a 24 hour blood pressure monitor but her blood pressure was very well controlled. BNP was normal. Pulmonary function testing was unremarkable. She said after her treadmill test she did feel poorly with some memory problems and was found to have apparently some hemorrhaging on eye exam. Continues to have the dyspnea which is described in previous notes.  Past Medical History  Diagnosis Date  . Diabetes mellitus   . Hyperlipidemia   . Obesity   . HTN (hypertension)   . Gastrointestinal stromal tumor (GIST)     removed 2016    Past Surgical History  Procedure Laterality Date  . Appendectomy    . Cholecystectomy    . Tonsillectomy    . Eus N/A 03/23/2014    Procedure: ESOPHAGEAL ENDOSCOPIC ULTRASOUND (EUS) RADIAL;  Surgeon: Arta Silence, MD;  Location: [facility];  Service: Endoscopy;  Laterality: N/A;  . Fine needle aspiration N/A 03/23/2014    Procedure: FINE NEEDLE ASPIRATION (FNA) LINEAR;  Surgeon: Arta Silence, MD;  Location: [facility];  Service: Endoscopy;  Laterality: N/A;     Current Outpatient Prescriptions  Medication Sig Dispense Refill  . ALPRAZolam (XANAX) 0.5 MG tablet Take 1 tablet (0.5 mg total) by mouth at bedtime. 90 tablet 0  . aspirin EC 81 MG tablet Take 81 mg by  mouth daily.    . butalbital-acetaminophen-caffeine (FIORICET, ESGIC) 50-325-40 MG per tablet TAKE 1-2 TABLETS BY MOUTH EVERY 6 HOURS AS NEEDED FOR HEADACHE 20 tablet 0  . FLUoxetine (PROZAC) 20 MG capsule Take 20 mg by mouth at bedtime.    Marland Kitchen levonorgestrel (MIRENA) 20 MCG/24HR IUD 1 each by Intrauterine route once.    . Multiple Vitamin (MULTIVITAMIN WITH MINERALS) TABS tablet Take 1 tablet by mouth daily.    Marland Kitchen NOVOFINE 32G X 6 MM MISC Inject 1 Stick as directed daily.  5  . omeprazole (PRILOSEC) 20 MG capsule Take 20 mg by mouth 2 (two) times daily.    . TRUE METRIX BLOOD GLUCOSE TEST test strip Apply 1 strip topically daily.  3  . TRUEPLUS LANCETS 30G MISC Inject 1 Stick as directed daily.  6  . VICTOZA 18 MG/3ML SOPN Inject 1.8 mg into the skin daily.  5  . Vitamin D, Ergocalciferol, (DRISDOL) 50000 UNITS CAPS capsule Take 50,000 Units by mouth every 7 (seven) days.     No current facility-administered medications for this visit.    Allergies:   Codeine and Hydrocodone    ROS:  Please see the history of present illness.   Otherwise, review of systems are positive for HA..   All other systems are reviewed and negative.    PHYSICAL EXAM: VS:  BP 134/86 mmHg  Pulse 78  Ht 5\' 5"  (1.651  m)  Wt 270 lb 8 oz (122.698 kg)  BMI 45.01 kg/m2 , BMI Body mass index is 45.01 kg/(m^2). GENERAL:  Well appearing NECK:  No jugular venous distention, waveform within normal limits, carotid upstroke brisk and symmetric, no bruits, no thyromegaly LUNGS:  Clear to auscultation bilaterally BACK:  No CVA tenderness CHEST:  Unremarkable HEART:  PMI not displaced or sustained,S1 and S2 within normal limits, no S3, no S4, no clicks, no rubs, no murmurs ABD:  Flat, positive bowel sounds normal in frequency in pitch, no bruits, no rebound, no guarding, no midline pulsatile mass, no hepatomegaly, no splenomegaly EXT:  2 plus pulses throughout, no edema, no cyanosis no clubbing   EKG:  EKG is  ordered  today.    Recent Labs: 05/05/2014: ALT 14; TSH 2.053 09/26/2014: BUN 7; Creatinine, Ser 0.52; Hemoglobin 13.6; Platelets 323; Potassium 3.7; Sodium 137    Lipid Panel    Component Value Date/Time   CHOL 165 05/05/2014 0902   TRIG 81 05/05/2014 0902   HDL 48 05/05/2014 0902   CHOLHDL 3.4 05/05/2014 0902   VLDL 16 05/05/2014 0902   LDLCALC 101* 05/05/2014 0902      Wt Readings from Last 3 Encounters:  11/15/14 270 lb 8 oz (122.698 kg)  11/01/14 260 lb (117.935 kg)  10/03/14 274 lb 6.4 oz (124.467 kg)      Other studies Reviewed: Additional studies/ records that were reviewed today include:    ASSESSMENT AND PLAN:  CHEST PAIN:   Her chest pain is atypical. The pretest probability of obstructive coronary disease is really low. No further testing is planned although she can come back to see me if this continues and there is no other etiology area  DYSPNEA:  this may be related to weight and deconditioning. I don't see a clear other allergy. Will follow-up as below.   HTN:  Although her blood pressure was very elevated peak exercise during the course of the day it's been normal on a 24-hour monitor. Therefore, I'm not going to adjust her medications. I have suggested a low level of exercise gradually increasing with weight loss and she can monitor her blood pressures with this. I'm also going to send her back to Dr.  Elsworth Soho to consider further evaluation for sleep apnea as she does have symptoms consistent with this but had a somewhat increased previously. He    Current medicines are reviewed at length with the patient today.  The patient does not have concerns regarding medicines.  The following changes have been made:  no change  Labs/ tests ordered today include:  No orders of the defined types were placed in this encounter.     Disposition:   FU with me after the above testing.     Signed, Minus Breeding, MD  11/15/2014 9:28 AM    [facility]

## 2014-11-15 NOTE — Patient Instructions (Signed)
Your physician recommends that you schedule a follow-up appointment As Needed  

## 2014-11-22 ENCOUNTER — Ambulatory Visit (INDEPENDENT_AMBULATORY_CARE_PROVIDER_SITE_OTHER): Payer: 59 | Admitting: Adult Health

## 2014-11-22 ENCOUNTER — Encounter: Payer: Self-pay | Admitting: Adult Health

## 2014-11-22 VITALS — BP 130/76 | HR 81 | Temp 98.1°F | Ht 65.0 in | Wt 265.0 lb

## 2014-11-22 DIAGNOSIS — G4730 Sleep apnea, unspecified: Secondary | ICD-10-CM

## 2014-11-22 DIAGNOSIS — G4719 Other hypersomnia: Secondary | ICD-10-CM | POA: Diagnosis not present

## 2014-11-22 NOTE — Assessment & Plan Note (Addendum)
Daytime sleepiness with snoring  ,restless sleep , obesity worrisome for OSA  Previous test was 2 yr ago.  Will try to HST to evaluate for possible OSA  Healthy sleep regimen discussed.   Plan  We are setting you up for a home sleep study  Decrease caffeine intake  Work on weight loss.  Follow up with Dr. Elsworth Martinez  After sleep study to discuss results

## 2014-11-22 NOTE — Progress Notes (Signed)
   Subjective:    Patient ID: Jasmine Martinez, female    DOB: 1970/08/22, 44 y.o.   MRN: [id number]  HPI 44 yo female seen for sleep consult in 2014 with no OSA noted on sleep study    11/22/2014 Follow up : Sleep issues  Pt returns for visit to discuss sleep issues.  Complains of snoring , tired wants to be retested for sleep apnea.  Complains of daytime sleepiness, fatigue, interupted sleep .  Watches TV at bedtime  Caffeine -drinks tea/soda , stop caffeine at 3pm.  Bedtime 9pm , wakes 6 am. Wakes up frequently.  Epworth score today is 12 .  She was tested in 2014 with no OSA noted. She says she did not sleep much in this test and was very restless. Does not feel it was very representative of her sleep regimen.  She has recently been evaluated with cardiology for chest pain.  She had a treadmill stress test that was neg for ischemia . B/p was very high.  PFTs were nml.      Review of Systems Constitutional:   No  weight loss, night sweats,  Fevers, chills,  +fatigue, or  lassitude.  HEENT:   No headaches,  Difficulty swallowing,  Tooth/dental problems, or  Sore throat,                No sneezing, itching, ear ache, nasal congestion, post nasal drip,   CV:  No chest pain,  Orthopnea, PND, swelling in lower extremities, anasarca, dizziness, palpitations, syncope.   GI  No heartburn, indigestion, abdominal pain, nausea, vomiting, diarrhea, change in bowel habits, loss of appetite, bloody stools.   Resp: No shortness of breath with exertion or at rest.  No excess mucus, no productive cough,  No non-productive cough,  No coughing up of blood.  No change in color of mucus.  No wheezing.  No chest wall deformity  Skin: no rash or lesions.  GU: no dysuria, change in color of urine, no urgency or frequency.  No flank pain, no hematuria   MS:  No joint pain or swelling.  No decreased range of motion.  No back pain.  Psych:  No change in mood or affect. No depression or anxiety.   No memory loss.         Objective:   Physical Exam GEN: A/Ox3; pleasant , NAD, obese   HEENT:  [place]/AT,  EACs-clear, TMs-wnl, NOSE-clear, THROAT-clear, no lesions, no postnasal drip or exudate noted.  Class 3 MP airway   NECK:  Supple w/ fair ROM; no JVD; normal carotid impulses w/o bruits; no thyromegaly or nodules palpated; no lymphadenopathy.  RESP  Clear  P & A; w/o, wheezes/ rales/ or rhonchi.no accessory muscle use, no dullness to percussion  CARD:  RRR, no m/r/g  , no peripheral edema, pulses intact, no cyanosis or clubbing.  GI:   Soft & nt; nml bowel sounds; no organomegaly or masses detected.  Musco: Warm bil, no deformities or joint swelling noted.   Neuro: alert, no focal deficits noted.    Skin: Warm, no lesions or rashes         Assessment & Plan:

## 2014-11-22 NOTE — Patient Instructions (Signed)
We are setting you up for a home sleep study  Decrease caffeine intake  Work on weight loss.  Follow up with Dr. Elsworth Soho  After sleep study to discuss results

## 2014-11-23 NOTE — Progress Notes (Signed)
Reviewed & agree with plan  

## 2014-11-24 NOTE — Addendum Note (Signed)
Addended by: Therisa Doyne on: 11/24/2014 04:29 PM   Modules accepted: Orders

## 2014-12-07 ENCOUNTER — Encounter: Payer: Self-pay | Admitting: Cardiology

## 2014-12-13 DIAGNOSIS — G4733 Obstructive sleep apnea (adult) (pediatric): Secondary | ICD-10-CM | POA: Diagnosis not present

## 2014-12-21 DIAGNOSIS — G4733 Obstructive sleep apnea (adult) (pediatric): Secondary | ICD-10-CM | POA: Diagnosis not present

## 2014-12-22 ENCOUNTER — Other Ambulatory Visit: Payer: Self-pay | Admitting: *Deleted

## 2014-12-22 DIAGNOSIS — G4730 Sleep apnea, unspecified: Secondary | ICD-10-CM

## 2014-12-29 ENCOUNTER — Ambulatory Visit: Payer: 59

## 2015-01-17 ENCOUNTER — Ambulatory Visit: Payer: Self-pay

## 2015-01-24 DIAGNOSIS — F411 Generalized anxiety disorder: Secondary | ICD-10-CM | POA: Diagnosis not present

## 2015-01-31 DIAGNOSIS — F411 Generalized anxiety disorder: Secondary | ICD-10-CM | POA: Diagnosis not present

## 2015-01-31 DIAGNOSIS — F320 Major depressive disorder, single episode, mild: Secondary | ICD-10-CM | POA: Diagnosis not present

## 2015-02-07 DIAGNOSIS — F411 Generalized anxiety disorder: Secondary | ICD-10-CM | POA: Diagnosis not present

## 2015-02-14 ENCOUNTER — Ambulatory Visit (INDEPENDENT_AMBULATORY_CARE_PROVIDER_SITE_OTHER): Payer: 59 | Admitting: Pulmonary Disease

## 2015-02-14 ENCOUNTER — Encounter: Payer: Self-pay | Admitting: Pulmonary Disease

## 2015-02-14 ENCOUNTER — Other Ambulatory Visit: Payer: Self-pay

## 2015-02-14 VITALS — BP 118/72 | HR 80 | Resp 16 | Ht 65.0 in | Wt 258.2 lb

## 2015-02-14 VITALS — BP 132/82 | HR 94 | Ht 65.0 in | Wt 258.2 lb

## 2015-02-14 DIAGNOSIS — R0600 Dyspnea, unspecified: Secondary | ICD-10-CM | POA: Diagnosis not present

## 2015-02-14 DIAGNOSIS — E119 Type 2 diabetes mellitus without complications: Secondary | ICD-10-CM

## 2015-02-14 DIAGNOSIS — F320 Major depressive disorder, single episode, mild: Secondary | ICD-10-CM | POA: Diagnosis not present

## 2015-02-14 DIAGNOSIS — F411 Generalized anxiety disorder: Secondary | ICD-10-CM | POA: Diagnosis not present

## 2015-02-14 DIAGNOSIS — G4719 Other hypersomnia: Secondary | ICD-10-CM

## 2015-02-14 LAB — POCT GLYCOSYLATED HEMOGLOBIN (HGB A1C): HEMOGLOBIN A1C: 6

## 2015-02-14 NOTE — Patient Instructions (Signed)
1. Plan to walk 5 times a week for 30 minutes.  Goal of 150 minutes a week 2. Plan to check blood sugar once a day fasting and 1  -2 hours after eating.  Goals of less than 100 fasting and less than 140 after eating. 3. Plan to eat protein with meals and snacks. 4. Plan to call Dr. Renold Genta to schedule appointment 5. Plan complete EMMI programs by 04/15/15 6. Return to [facility] on 05/16/15 at 1:30PM

## 2015-02-14 NOTE — Assessment & Plan Note (Signed)
Sleep study reviewed -nml No intervention

## 2015-02-14 NOTE — Patient Instructions (Signed)
Breathing test & sleep study normal Focus on weight loss & exercise program

## 2015-02-14 NOTE — Patient Outreach (Signed)
[facility]) Care Management   02/14/2015  Jasmine Martinez 1970/10/17 [id number]  Jasmine Martinez is an 45 y.o. female  Member seen for follow up office visit for Link to Wellness program for self management of Type 2 diabetes  Subjective: Member states that she is doing the healthy wage challenge and she is trying to lose 6% of her weight.  States that she has been focused on eating healthy and starting to exercise more.   States she had SOB and thought she had sleep apnea but she saw MD this AM and she does not have sleep apnea.  States that her breathing has been getting better since she has started to lose weight.  States that her blood sugars have been good.  States she has not seen Dr. Renold Genta since last April.  States she had an endoscopy at [facility] in December and they are watching her stomach tumor and will recheck her next December  Objective:   Review of Systems  All other systems reviewed and are negative. POC HemoglobinA1C 6%  Physical Exam  Today's Vitals   02/14/15 1340  BP: 118/72  Pulse: 80  Resp: 16  Height: 1.651 m (5\' 5" )  Weight: 258 lb 3.2 oz (117.119 kg)  SpO2: 99%  PainSc: 0-No pain    Current Medications:   Current Outpatient Prescriptions  Medication Sig Dispense Refill  . ALPRAZolam (XANAX) 0.5 MG tablet Take 1 tablet (0.5 mg total) by mouth at bedtime. 90 tablet 0  . aspirin EC 81 MG tablet Take 81 mg by mouth daily.    . butalbital-acetaminophen-caffeine (FIORICET, ESGIC) 50-325-40 MG per tablet TAKE 1-2 TABLETS BY MOUTH EVERY 6 HOURS AS NEEDED FOR HEADACHE 20 tablet 0  . levonorgestrel (MIRENA) 20 MCG/24HR IUD 1 each by Intrauterine route once.    . Multiple Vitamin (MULTIVITAMIN WITH MINERALS) TABS tablet Take 1 tablet by mouth daily.    Marland Kitchen NOVOFINE 32G X 6 MM MISC Inject 1 Stick as directed daily.  5  . TRUE METRIX BLOOD GLUCOSE TEST test strip Apply 1 strip topically daily.  3  . TRUEPLUS LANCETS 30G MISC Inject 1 Stick as  directed daily.  6  . VICTOZA 18 MG/3ML SOPN Inject 1.8 mg into the skin daily.  5  . Vitamin D, Ergocalciferol, (DRISDOL) 50000 UNITS CAPS capsule Take 50,000 Units by mouth every 7 (seven) days.     No current facility-administered medications for this visit.    Functional Status:   In your present state of health, do you have any difficulty performing the following activities: 02/14/2015 06/23/2014  Hearing? N N  Vision? N N  Difficulty concentrating or making decisions? N N  Walking or climbing stairs? N N  Dressing or bathing? N N  Doing errands, shopping? N N    Fall/Depression Screening:    PHQ 2/9 Scores 02/14/2015 06/23/2014  PHQ - 2 Score 0 2  PHQ- 9 Score - 4    Assessment:   Member seen for follow up office visit for Link to Wellness program for self management of Type 2 diabetes.  Member is meeting diabetes self management goal of hemoglobin A1C of 6.5 or below with today's result of 6%.  Member has lost 9.4 lbs since last Link to Wellness visit.  Reports counting calories and she is participating in the [employer] contest at work.  Member is slowly increasing her exercise due to previous dyspnea and has worked up to 30 minutes daily walking at work.  Member up to date with annual eye exam and dental checkups.  Member has not seen primary care MD since last April and is in need of scheduling an appointment with her.  Plan:   Plan to walk 5 times a week for 30 minutes.  Goal of 150 minutes a week Plan to check blood sugar once a day fasting and 1  -2 hours after eating.  Goals of less than 100 fasting and less than 140 after eating. Plan to eat protein with meals and snacks. Plan to call Dr. Renold Genta to schedule appointment Plan complete EMMI programs by 04/15/15 Return to Link to Wellness on 05/16/15 at 1:30PM  [facility] CM Care Plan Problem One        Most Recent Value   Care Plan Problem One  Potential for elevated blood sugars related to dx of Type 2 DM   Role Documenting the  Problem One  Care Management [place] for Problem One  Active   THN Long Term Goal (31-90 days)  Member will maintain hemoglobin A1c at or below 6.5 for the next 90 days   THN Long Term Goal Start Date  02/14/15 [ Hemoglobin A1C 6 today]   Interventions for Problem One Long Term Goal  Reinforced to limit CHO and portion sizes, Encouraged to eat more protein with her meals and snacks, Praised for weight loss, Reinforced  importance of eating protein with her CHO to help keep her blood sugar steady,  Encouraged to continue exercising regularly    Peter Garter RN, [facility] Care Management Coordinator-Link to [place] Management [phone]

## 2015-02-14 NOTE — Progress Notes (Signed)
   Subjective:    Patient ID: Jasmine Martinez, female    DOB: 12/23/1970, 45 y.o.   MRN: [id number]  HPI 45 yo diabetic for FU excessive tiredness & dyspnea   02/14/2015  Chief Complaint  Patient presents with  . Follow-up    Review HST from 12/13/14   She gained 20 lbs over last 2 y Reviewed HST - AHI 4/h, supine 6/h    Complains of snoring , tired was retested for sleep apnea.  Complains of daytime sleepiness, fatigue, interupted sleep .  Watches TV at bedtime  Caffeine -drinks tea/soda , stop caffeine at 3pm.  Bedtime 9pm , wakes 6 am. Wakes up frequently.  ESS was 12 .  She was tested in 2014 with no OSA noted.   She was evaluated by cardiology for chest pain.  She had a treadmill stress test that was neg for ischemia . B/p rose to 220/180 10/2014 PFTs were nml.    Review of Systems Pt denies any significant  nasal congestion or excess secretions, fever, chills, sweats, unintended wt loss, pleuritic or exertional cp, orthopnea pnd or leg swelling.  Pt also denies any obvious fluctuation in symptoms with weather or environmental change or other alleviating or aggravating factors.          Objective:   Physical Exam  Gen. Pleasant, obese, in no distress ENT - no lesions, no post nasal drip Neck: No JVD, no thyromegaly, no carotid bruits Lungs: no use of accessory muscles, no dullness to percussion, decreased without rales or rhonchi  Cardiovascular: Rhythm regular, heart sounds  normal, no murmurs or gallops, no peripheral edema Musculoskeletal: No deformities, no cyanosis or clubbing , no tremors       Assessment & Plan:

## 2015-02-14 NOTE — Assessment & Plan Note (Addendum)
-  related to deconditioning PFTs nml Focus on weight loss & exercise program

## 2015-02-21 DIAGNOSIS — F411 Generalized anxiety disorder: Secondary | ICD-10-CM | POA: Diagnosis not present

## 2015-02-21 DIAGNOSIS — F320 Major depressive disorder, single episode, mild: Secondary | ICD-10-CM | POA: Diagnosis not present

## 2015-02-28 DIAGNOSIS — F320 Major depressive disorder, single episode, mild: Secondary | ICD-10-CM | POA: Diagnosis not present

## 2015-02-28 DIAGNOSIS — F411 Generalized anxiety disorder: Secondary | ICD-10-CM | POA: Diagnosis not present

## 2015-03-07 DIAGNOSIS — F320 Major depressive disorder, single episode, mild: Secondary | ICD-10-CM | POA: Diagnosis not present

## 2015-03-07 DIAGNOSIS — F411 Generalized anxiety disorder: Secondary | ICD-10-CM | POA: Diagnosis not present

## 2015-03-14 DIAGNOSIS — F411 Generalized anxiety disorder: Secondary | ICD-10-CM | POA: Diagnosis not present

## 2015-03-14 DIAGNOSIS — F320 Major depressive disorder, single episode, mild: Secondary | ICD-10-CM | POA: Diagnosis not present

## 2015-03-23 DIAGNOSIS — Z01419 Encounter for gynecological examination (general) (routine) without abnormal findings: Secondary | ICD-10-CM | POA: Diagnosis not present

## 2015-03-23 DIAGNOSIS — Z1231 Encounter for screening mammogram for malignant neoplasm of breast: Secondary | ICD-10-CM | POA: Diagnosis not present

## 2015-03-23 DIAGNOSIS — Z6841 Body Mass Index (BMI) 40.0 and over, adult: Secondary | ICD-10-CM | POA: Diagnosis not present

## 2015-03-28 DIAGNOSIS — F320 Major depressive disorder, single episode, mild: Secondary | ICD-10-CM | POA: Diagnosis not present

## 2015-03-28 DIAGNOSIS — F411 Generalized anxiety disorder: Secondary | ICD-10-CM | POA: Diagnosis not present

## 2015-03-30 DIAGNOSIS — S99911A Unspecified injury of right ankle, initial encounter: Secondary | ICD-10-CM | POA: Diagnosis not present

## 2015-04-04 DIAGNOSIS — F320 Major depressive disorder, single episode, mild: Secondary | ICD-10-CM | POA: Diagnosis not present

## 2015-04-04 DIAGNOSIS — F411 Generalized anxiety disorder: Secondary | ICD-10-CM | POA: Diagnosis not present

## 2015-04-18 DIAGNOSIS — F411 Generalized anxiety disorder: Secondary | ICD-10-CM | POA: Diagnosis not present

## 2015-04-18 DIAGNOSIS — F320 Major depressive disorder, single episode, mild: Secondary | ICD-10-CM | POA: Diagnosis not present

## 2015-04-26 ENCOUNTER — Telehealth: Payer: Self-pay | Admitting: Internal Medicine

## 2015-04-26 ENCOUNTER — Ambulatory Visit (INDEPENDENT_AMBULATORY_CARE_PROVIDER_SITE_OTHER): Payer: 59 | Admitting: Internal Medicine

## 2015-04-26 ENCOUNTER — Encounter: Payer: Self-pay | Admitting: Internal Medicine

## 2015-04-26 VITALS — BP 108/72 | HR 74 | Temp 97.2°F | Resp 18 | Ht 65.0 in | Wt 252.0 lb

## 2015-04-26 DIAGNOSIS — I10 Essential (primary) hypertension: Secondary | ICD-10-CM

## 2015-04-26 DIAGNOSIS — Z8509 Personal history of malignant neoplasm of other digestive organs: Secondary | ICD-10-CM

## 2015-04-26 DIAGNOSIS — F439 Reaction to severe stress, unspecified: Secondary | ICD-10-CM

## 2015-04-26 DIAGNOSIS — R140 Abdominal distension (gaseous): Secondary | ICD-10-CM

## 2015-04-26 DIAGNOSIS — Z658 Other specified problems related to psychosocial circumstances: Secondary | ICD-10-CM

## 2015-04-26 DIAGNOSIS — R1013 Epigastric pain: Secondary | ICD-10-CM | POA: Diagnosis not present

## 2015-04-26 DIAGNOSIS — E119 Type 2 diabetes mellitus without complications: Secondary | ICD-10-CM

## 2015-04-26 NOTE — Telephone Encounter (Signed)
States she's sorry that she failed to ask this when she was here.  States she believes she has "white coat syndrome" when she's in front of you.  Wanted to know if you think her Oncologist, Dr. Wandra Scot @ [facility] should be notified of this new symptom since he did the original CT a year ago?  She saw Dr. Michail Sermon and he sent her to [place] eventually wound up doing all of the tests over that she had done here.  She just doesn't want to undergo all of the tests again and have the expense again of having to have something repeated again and that expense again.    Dr. Wandra Scot then sent her to a Dr. Mont Dutton at [facility].  I'm guessing these are all notes that we have been sent from [facility] and scanned into EPIC.  She said not knowing if this "new symptom" is related at all, she wasn't thinking if this was something that she should just go back to [facility] for and have them see her since she had had CT there or just see Dr. Michail Sermon as you had suggested.  She just wanted to run that past you since she couldn't think of that when she was in here earlier.    Said she's sorry for the added time and frustration for you, but appreciates your input.  Please advise.  Thank you.

## 2015-04-26 NOTE — Telephone Encounter (Signed)
Patient states that she is going to call [facility] and see what they recommend. We will hold off on CT for right now until she gets recommendation from both GI and oncology on [facility].

## 2015-04-26 NOTE — Telephone Encounter (Signed)
I think it probably is a good idea for her to call her oncologist. We can certainly postpone CT until she hears back.

## 2015-04-27 ENCOUNTER — Telehealth: Payer: Self-pay | Admitting: Internal Medicine

## 2015-04-27 DIAGNOSIS — Z8509 Personal history of malignant neoplasm of other digestive organs: Secondary | ICD-10-CM

## 2015-04-27 DIAGNOSIS — R109 Unspecified abdominal pain: Secondary | ICD-10-CM

## 2015-05-02 NOTE — Telephone Encounter (Signed)
Ct scan ordered and information given to [place] [facility]. They will contact patient

## 2015-05-02 NOTE — Telephone Encounter (Signed)
Please schedule

## 2015-05-02 NOTE — Telephone Encounter (Signed)
Spoke with patient on Thursday evening around 5pm.  Patient stated that she had spoken with Dr. Acey Lav @ [place].  He advised to proceed with the CT here in [facility] as Dr. Renold Genta has suggested.  He would like to be carbon copied on the results.  Dr. Angelina Ok does suggest that patient follow up with her GI specialist, Dr. Tillie Rung @ [place].    Patient is great with having the CT here at a [facility] facility and then having that information faxed to Drs. Riedel and [place] for their review.  Please go ahead and proceed with scheduling CT scan per patient.

## 2015-05-09 DIAGNOSIS — F411 Generalized anxiety disorder: Secondary | ICD-10-CM | POA: Diagnosis not present

## 2015-05-09 DIAGNOSIS — F320 Major depressive disorder, single episode, mild: Secondary | ICD-10-CM | POA: Diagnosis not present

## 2015-05-11 ENCOUNTER — Ambulatory Visit
Admission: RE | Admit: 2015-05-11 | Discharge: 2015-05-11 | Disposition: A | Discharge status: Home / Self Care | Payer: 59 | Source: Ambulatory Visit | Attending: Internal Medicine | Admitting: Internal Medicine

## 2015-05-11 DIAGNOSIS — R109 Unspecified abdominal pain: Secondary | ICD-10-CM

## 2015-05-11 DIAGNOSIS — Z8509 Personal history of malignant neoplasm of other digestive organs: Secondary | ICD-10-CM

## 2015-05-11 DIAGNOSIS — C49A0 Gastrointestinal stromal tumor, unspecified site: Secondary | ICD-10-CM | POA: Diagnosis not present

## 2015-05-11 MED ORDER — IOPAMIDOL (ISOVUE-300) INJECTION 61%
125.0000 mL | Freq: Once | INTRAVENOUS | Status: AC | PRN
  Administered 2015-05-11: 125 mL via INTRAVENOUS

## 2015-05-14 NOTE — Patient Instructions (Addendum)
To have CT of abdomen and pelvis and follow-up with her oncologist at [facility]. She may need see gastroenterologist there. Have prescribed Xanax for her to take at bedtime.

## 2015-05-14 NOTE — Progress Notes (Signed)
   Subjective:    Patient ID: Jasmine Martinez, female    DOB: 1970-02-27, 45 y.o.   MRN: [id number]  HPI 45 year old White Female with history of GIST tumor treated at [facility] in today with complaint of abdominal bloating and dyspepsia. Some situational stress with job. Long-standing history of diarrhea which apparently started after cholecystectomy. Has been evaluated by Dr. Dario Guardian GI. Patient says colonoscopy did not reveal inflammatory bowel disease and celiac sprue testing was negative. 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  Duodenal biopsy showed no active inflammation. Stomach biopsy showed no evidence of H. pylori. Patient was thought to probably have irritable bowel syndrome. Subsequently had endoscopy showing a submucosal gastric lesion. She then had endoscopic ultrasound showing a submucosal-appearing nodule in the proximal stomach measuring 1.5 x 1.2 cm. 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  She has a history of type 2 diabetes has been diabetic for about 8 years. maintained on Victoza. Is intolerant of lisinopril-causes a cough. Codeine and hydrocodone cause shortness of breath.  Dr. Buddy Duty  sees her for diabetes.  She has a Mirena IUD and GYN is Dr. Philis Pique.  Has been diagnosed with depression at least 4 times in her life. Has been treated with Pristiq and also has tried Wellbutrin.  Had sleep apnea evaluation by Dr. Elsworth Soho which was negative.  Appendectomy 2010, cholecystectomy 2005, tonsillectomy and adenoidectomy 2005, D&C 1997, D&C 2010, miscarriage 2007. Degenerative disc disease L4-S1.  Family history: Paternal great aunts and uncles with history of stomach and colon cancer. Mother with history of hypertension. Father in good health. 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 She is married. Husband is disabled. One son 72 years old. Does not smoke or  consume alcohol.  History of allergic rhinitis based on testing the Dr. Shaune Leeks April 2016 showed slight reactive due to dust mite and minimal reactivity to cat. Skin test to foods did not show IgE allergy. 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 Nodes none. Neck is supple. Chest clear to auscultation. Cardiac exam regular rate and rhythm. Abdomen: no hepatosplenomegaly masses. Some mild epigastric tenderness without rebound.       Assessment & Plan:  Epigastric tenderness and bloating-history of GIST tumor  May have IBS- D  Essential hypertension  Type 2 diabetes mellitus  Situational stress and anxiety  Could be an element of GE reflux. History of GIST tumor. In light of history of GIST tumor, think she should have CT of abdomen and pelvis for further evaluation.  She may need to see gastroenterologist at [facility]. Have given her prescription for Xanax to take at bedtime for situational stress. She will contact her oncologist at [facility]  Addendum: Patient called her oncologist and he does feel CT of abdomen and pelvis as a good idea. We will proceed.  Addendum: 05/13/2015 CT of abdomen and pelvis is unremarkable. Patient to let oncologist know and ask for further recommendations.

## 2015-05-16 ENCOUNTER — Ambulatory Visit: Payer: Self-pay

## 2015-05-16 DIAGNOSIS — F411 Generalized anxiety disorder: Secondary | ICD-10-CM | POA: Diagnosis not present

## 2015-05-23 DIAGNOSIS — F411 Generalized anxiety disorder: Secondary | ICD-10-CM | POA: Diagnosis not present

## 2015-05-23 DIAGNOSIS — F320 Major depressive disorder, single episode, mild: Secondary | ICD-10-CM | POA: Diagnosis not present

## 2015-05-30 DIAGNOSIS — F330 Major depressive disorder, recurrent, mild: Secondary | ICD-10-CM | POA: Diagnosis not present

## 2015-05-30 DIAGNOSIS — F411 Generalized anxiety disorder: Secondary | ICD-10-CM | POA: Diagnosis not present

## 2015-06-06 ENCOUNTER — Other Ambulatory Visit: Payer: Self-pay

## 2015-06-06 DIAGNOSIS — F330 Major depressive disorder, recurrent, mild: Secondary | ICD-10-CM | POA: Diagnosis not present

## 2015-06-06 DIAGNOSIS — F411 Generalized anxiety disorder: Secondary | ICD-10-CM | POA: Diagnosis not present

## 2015-06-06 MED ORDER — NOVOFINE 32G X 6 MM MISC: 1.0000 | Freq: Every day | Status: DC

## 2015-06-06 MED ORDER — TRUEPLUS LANCETS 30G MISC: 1.0000 | Freq: Every day | Status: DC

## 2015-06-06 MED ORDER — VICTOZA 18 MG/3ML ~~LOC~~ SOPN: 1.8000 mg | PEN_INJECTOR | Freq: Every day | SUBCUTANEOUS | Status: DC

## 2015-06-07 MED FILL — NOVOFINE 32G NEEDLES: 32G X 6 MM | 90 days supply | Qty: 100 | Fill #0

## 2015-06-07 MED FILL — TRUEplus LANCETS 30G MISC: 90 days supply | Qty: 100 | Fill #0

## 2015-06-07 MED FILL — VICTOZA 18 MG/3 ML INJECT P: 18 | 30 days supply | Qty: 9 | Fill #0

## 2015-06-13 DIAGNOSIS — F330 Major depressive disorder, recurrent, mild: Secondary | ICD-10-CM | POA: Diagnosis not present

## 2015-06-13 DIAGNOSIS — F411 Generalized anxiety disorder: Secondary | ICD-10-CM | POA: Diagnosis not present

## 2015-06-15 ENCOUNTER — Ambulatory Visit: Payer: Self-pay

## 2015-06-20 DIAGNOSIS — F330 Major depressive disorder, recurrent, mild: Secondary | ICD-10-CM | POA: Diagnosis not present

## 2015-06-20 DIAGNOSIS — F411 Generalized anxiety disorder: Secondary | ICD-10-CM | POA: Diagnosis not present

## 2015-06-23 DIAGNOSIS — R1031 Right lower quadrant pain: Secondary | ICD-10-CM | POA: Diagnosis not present

## 2015-06-26 DIAGNOSIS — M25551 Pain in right hip: Secondary | ICD-10-CM | POA: Diagnosis not present

## 2015-06-30 DIAGNOSIS — M25551 Pain in right hip: Secondary | ICD-10-CM | POA: Diagnosis not present

## 2015-07-03 DIAGNOSIS — M25551 Pain in right hip: Secondary | ICD-10-CM | POA: Diagnosis not present

## 2015-07-04 ENCOUNTER — Other Ambulatory Visit: Payer: Self-pay | Admitting: Certified Nurse Midwife

## 2015-07-04 DIAGNOSIS — S72001S Fracture of unspecified part of neck of right femur, sequela: Secondary | ICD-10-CM

## 2015-07-04 MED ORDER — CYCLOBENZAPRINE HCL 10 MG PO TABS: 10.0000 mg | ORAL_TABLET | Freq: Three times a day (TID) | ORAL | Status: DC | PRN

## 2015-07-04 MED ORDER — LIDOCAINE 5 % EX PTCH: 1.0000 | MEDICATED_PATCH | CUTANEOUS | Status: DC

## 2015-07-04 MED ORDER — HYDROMORPHONE HCL 4 MG PO TABS: 4.0000 mg | ORAL_TABLET | ORAL | Status: DC | PRN

## 2015-07-04 MED FILL — CYCLOBENZAPRINE 10 MG TAB: 10 | 15 days supply | Qty: 45 | Fill #0

## 2015-07-04 MED FILL — LIDOCAINE 5% PATCH: 5 | 30 days supply | Qty: 30 | Fill #0

## 2015-07-04 MED FILL — HYDROmorphone HCL 4 MG TABS: 4 | 5 days supply | Qty: 30 | Fill #0

## 2015-07-06 ENCOUNTER — Ambulatory Visit: Payer: 59 | Attending: Orthopedic Surgery | Admitting: Physical Therapy

## 2015-07-06 DIAGNOSIS — M6281 Muscle weakness (generalized): Secondary | ICD-10-CM | POA: Diagnosis not present

## 2015-07-06 DIAGNOSIS — M25551 Pain in right hip: Secondary | ICD-10-CM | POA: Insufficient documentation

## 2015-07-06 DIAGNOSIS — M25651 Stiffness of right hip, not elsewhere classified: Secondary | ICD-10-CM | POA: Insufficient documentation

## 2015-07-06 NOTE — Patient Instructions (Signed)
   Copyright  VHI. All rights reserved.  HIP: Flexion / KNEE: Extension, Straight Leg Raise   Raise leg, keeping knee straight. Perform slowly. _10__ reps per set, _2__ sets per day, __7_ days per week   Copyright  VHI. All rights reserved.  Heel Slide   Bend knee and pull heel toward buttocks. Hold __3__ seconds. Return. Repeat with other knee. Repeat __10__ times. Do ___2_ sessions per day.  http://gt2.exer.us/372   Copyright  VHI. All rights reserved.     Raise leg until knee is straight. __10_ reps per set, ___2 sets per day, _7__ days per week    Also slide right heel up and down shin 10x      Copyright  VHI. All rights reserved.  Short Arc [employer] a large can or rolled towel under leg. Straighten knee and leg. Hold __3__ seconds. Repeat with other leg. Repeat __10__ times. Do _2___ sessions per day.  http://gt2.exer.us/365   Copyright  VHI. All rights reserved.  Quad Set   Slowly tighten muscles on thigh of straight leg while counting out loud to 3____. Repeat with other leg. Repeat __10__ times. Do ___2_ sessions per day.  http://gt2.exer.us/361   Copyright  VHI. All rights reserved.     Ruben Im PT [facility] [address], [place], [place] [address] Phone # [phone] Fax [phone]

## 2015-07-06 NOTE — Therapy (Signed)
[facility] [address] [place], [place], [address] Phone: [phone]   Fax:  [phone]  Physical Therapy Evaluation  Patient Details  Name: Jasmine Martinez MRN: [id number] Date of Birth: 12-20-1970 Referring Provider: Dr. French Ana  Encounter Date: 07/06/2015      PT End of Session - 07/06/15 1141    Visit Number 1   Date for PT Re-Evaluation 08/31/15   Authorization Type [facility]   PT Start Time 1055   PT Stop Time 1140   PT Time Calculation (min) 45 min   Activity Tolerance Patient tolerated treatment well      Past Medical History  Diagnosis Date  . Diabetes mellitus   . Hyperlipidemia   . Obesity   . HTN (hypertension)   . Gastrointestinal stromal tumor (GIST)     removed 2016    Past Surgical History  Procedure Laterality Date  . Appendectomy    . Cholecystectomy    . Tonsillectomy    . Eus N/A 03/23/2014    Procedure: ESOPHAGEAL ENDOSCOPIC ULTRASOUND (EUS) RADIAL;  Surgeon: Arta Silence, MD;  Location: [facility];  Service: Endoscopy;  Laterality: N/A;  . Fine needle aspiration N/A 03/23/2014    Procedure: FINE NEEDLE ASPIRATION (FNA) LINEAR;  Surgeon: Arta Silence, MD;  Location: [facility];  Service: Endoscopy;  Laterality: N/A;    There were no vitals filed for this visit.       Subjective Assessment - 07/06/15 1103    Subjective Fell in March hyperflexing foot and was in boot for a few weeks.  Few weeks ago while getting out of car, planted and twisted, pushing off with right hip 6/8.  MRI shows gluteus minimus tendon and muscle tear and small avulsion greater trochanter fracture.  Difficulty sleeping b/c lying on right side is painful.  Cautions with exiting car.  Prolonged walking hurts.  Difficulty putting on sock and shoe.     Pertinent History hx of GI cancer stroma   Limitations Walking;Standing   How long can you sit comfortably? will start to burn 30-40 min   How long  can you walk comfortably? 5 min   Diagnostic tests MRI   Patient Stated Goals be able to put my shoe on myself, put socks on   Currently in Pain? Yes   Pain Score 2    Pain Location Hip   Pain Orientation Right;Anterior;Lateral   Pain Type Acute pain   Pain Onset 1 to 4 weeks ago   Pain Frequency Intermittent   Aggravating Factors  socks/shoes, walking, getting out of car, right sidelying   Pain Relieving Factors lying on my back;  standing with weight on left, heat            [facility] PT Assessment - 07/06/15 0001    Assessment   Medical Diagnosis right greater trochanter fx   Referring Provider Dr. French Ana   Onset Date/Surgical Date 06/22/15   Next MD Visit 4-6 weeks   Prior Therapy ankle sprain years ago   Precautions   Precautions None   Restrictions   Weight Bearing Restrictions No   Balance Screen   Has the patient fallen in the past 6 months Yes   How many times? 1  on ice   Has the patient had a decrease in activity level because of a fear of falling?  No   Is the patient reluctant to leave their home because of a fear of falling?  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;  hiking with her son; traveling with son   Observation/Other Assessments   Focus on Therapeutic Outcomes (FOTO)  44% limitation   ROM / Strength   AROM / PROM / Strength AROM;Strength   AROM   AROM Assessment Site Hip;Knee   Right/Left Hip Right;Left   Right Hip Extension 10   Right Hip Flexion 85   Right Hip External Rotation  10  painful   Right Hip Internal Rotation  10   Right Hip ABduction 10  painful   Left Hip Extension 20   Left Hip  Flexion 95   Left Hip External Rotation  45   Left Hip Internal Rotation  20   Left Hip ABduction 20   Strength   Overall Strength Comments Right single leg stand 4 sec   Strength Assessment Site Hip;Knee   Right/Left Hip Right;Left   Right Hip Flexion 4-/5   Right Hip Extension 4-/5   Right Hip External Rotation  3-/5   Right Hip Internal Rotation 3-/5   Right Hip ABduction 3-/5   Left Hip Flexion 5/5   Left Hip Extension 5/5   Left Hip External Rotation 5/5   Left Hip Internal Rotation 5/5   Left Hip ABduction 5/5   Flexibility   Hamstrings right 70, left 80   Palpation   Palpation comment tender over greater trochanter, minor tenderness gluteals   Ambulation/Gait   Gait Comments Short distance with symmetrical stride lengths and stance times;  no visible limp with short distances                           PT Education - 07/06/15 1130    Education provided Yes   Education Details level 1 ROM exercises   Person(s) Educated Patient   Methods Explanation;Handout   Comprehension Verbalized understanding          PT Short Term Goals - 07/06/15 1155    PT SHORT TERM GOAL #1   Title Patient will have knowledge of basic self care to promote healing of fracture and soft tissue 08/03/15   Time 4   Period Weeks   Status New   PT SHORT TERM GOAL #2   Title The patient will report a 25% improvement in pain with walking, sitting and exiting car   Time 4   Period Weeks   Status New   PT SHORT TERM GOAL #3   Title Right hip IR improved to 25 degrees to aid putting on socks and shoes   Time 4   Period Weeks   Status New   PT SHORT TERM GOAL #4   Title Patient will be able to walk 15 min needed for community mobility   Time 4   Period Weeks   Status New           PT Long Term Goals - 07/06/15 1158    PT LONG TERM GOAL #1   Title The patient will be independent in safe, self progression of HEP    08/31/15   Time 8  Period Weeks   Status New   PT  LONG TERM GOAL #2   Title Patient will have 40 degrees of hip external rotation ROM needed for greater ease with putting on socks and shoes.   Time 8   Period Weeks   Status New   PT LONG TERM GOAL #3   Title The patient will have right hip strength to 4/5 needed for standing and walking longer periods of time   Time 8   Period Weeks   Status New   PT LONG TERM GOAL #4   Title The patient will be able to walk 25-30 minutes for grocery shopping with min complaint of pain   Time 8   Period Weeks   Status New   PT LONG TERM GOAL #5   Title FOTO functional outcome score improved from 44% limitation to 32% indicating improved function with less pain   Time 8   Period Weeks   Status New               Plan - 07/06/15 1145    Clinical Impression Statement The patient is of low complexity evaluation.  On 06/22/15, 2 weeks ago, she was getting out of the car, planted and twisted to push off she had sudden onset of right hip pain.  She had an MRI which showed  gluteus minimus tendon and muscle tear and small avulsion greater trochanter fracture.  Difficulty sleeping b/c lying on right side is painful.  Difficulty putting on sock and shoe.  She is limited in walking tolerance to 5 min and she is cautious with exiting the car.  She is tender over greater trochanter.  Decreased ROM in all planes especially ER.  Strength in hip flex and extensors 4-/5 , hip abd, IR and Er 3-/5.  Decreased HS and hip flexor lengths.     Rehab Potential Good   Clinical Impairments Affecting Rehab Potential history of GI stromal tumor---no U/S   PT Frequency 2x / week   PT Duration 8 weeks   PT Treatment/Interventions ADLs/Self Care Home Management;Cryotherapy;Electrical Stimulation;Iontophoresis 4mg /ml Dexamethasone;Moist Heat;Therapeutic exercise;Patient/family education;Dry needling;Taping;Manual techniques   PT Next Visit Plan Nu-Step for ROM;  kinesiotaping; e-stim/heat for pain control;  ROM low level ther ex       Patient will benefit from skilled therapeutic intervention in order to improve the following deficits and impairments:  Decreased range of motion, Decreased strength, Impaired flexibility, Pain, Difficulty walking  Visit Diagnosis: Pain in right hip - Plan: PT plan of care cert/re-cert  Stiffness of right hip, not elsewhere classified - Plan: PT plan of care cert/re-cert  Muscle weakness (generalized) - Plan: PT plan of care cert/re-cert     Problem List Patient Active Problem List   Diagnosis Date Noted  . Daytime hypersomnolence 11/22/2014  . HTN (hypertension) 11/15/2014  . Dyspnea 11/01/2014  . Chronic diarrhea 05/06/2014  . Vitamin D deficiency 05/06/2014  . Gastrointestinal stromal tumor (GIST) of stomach 05/06/2014  . Anxiety state 05/06/2014  . Diabetes mellitus 05/07/2011     Ruben Im, PT 07/06/2015 12:06 PM Phone: [phone] Fax: [phone]  Alvera Singh 07/06/2015, 12:05 PM  [facility] [address], [place], [place], [address] Phone: [phone]   Fax:  [phone]  Name: KENDY PATZER MRN: [id number] Date of Birth: 18-Mar-1970

## 2015-07-11 ENCOUNTER — Ambulatory Visit: Payer: 59 | Admitting: Physical Therapy

## 2015-07-11 DIAGNOSIS — M25651 Stiffness of right hip, not elsewhere classified: Secondary | ICD-10-CM | POA: Diagnosis not present

## 2015-07-11 DIAGNOSIS — M6281 Muscle weakness (generalized): Secondary | ICD-10-CM | POA: Diagnosis not present

## 2015-07-11 DIAGNOSIS — M25551 Pain in right hip: Secondary | ICD-10-CM | POA: Diagnosis not present

## 2015-07-11 NOTE — Therapy (Signed)
[facility] [address] [place], [place], [address] Phone: [phone]   Fax:  [phone]  Physical Therapy Treatment  Patient Details  Name: Jasmine Martinez MRN: [id number] Date of Birth: 1970/06/28 Referring Provider: Dr. French Ana  Encounter Date: 07/11/2015      PT End of Session - 07/11/15 1640    Visit Number 2   Date for PT Re-Evaluation 08/31/15   Authorization Type [facility]   PT Start Time 1615   PT Stop Time 1653   PT Time Calculation (min) 38 min   Activity Tolerance Patient tolerated treatment well      Past Medical History  Diagnosis Date  . Diabetes mellitus   . Hyperlipidemia   . Obesity   . HTN (hypertension)   . Gastrointestinal stromal tumor (GIST)     removed 2016    Past Surgical History  Procedure Laterality Date  . Appendectomy    . Cholecystectomy    . Tonsillectomy    . Eus N/A 03/23/2014    Procedure: ESOPHAGEAL ENDOSCOPIC ULTRASOUND (EUS) RADIAL;  Surgeon: Arta Silence, MD;  Location: [facility];  Service: Endoscopy;  Laterality: N/A;  . Fine needle aspiration N/A 03/23/2014    Procedure: FINE NEEDLE ASPIRATION (FNA) LINEAR;  Surgeon: Arta Silence, MD;  Location: [facility];  Service: Endoscopy;  Laterality: N/A;    There were no vitals filed for this visit.      Subjective Assessment - 07/11/15 1617    Subjective I stopped taking the Advil but I realized I still needed it.  Turning/twisting motion still painful.  Turning over at night is painful.  Friday I walked all day and it felt good.  I was in the car all day Saturday.     Currently in Pain? No/denies   Pain Score 0-No pain   Pain Location Hip   Pain Orientation Right   Pain Type Acute pain                         [facility] Adult PT Treatment/Exercise - 07/11/15 0001    Knee/Hip Exercises: Aerobic   Recumbent Bike L1 8 min   Nustep L1 6 min   Knee/Hip Exercises: Standing   Heel Raises  Both;1 set;10 reps   SLS with Vectors 3 ways 10x right only   Knee/Hip Exercises: Seated   Ball Squeeze 15 x with quad set    Clamshell with TheraBand Red  15x   Knee/Hip Exercises: Supine   Heel Slides AROM;Right;1 set;10 reps   Bridges Strengthening;Both;1 set;15 reps   Knee Extension Limitations hip extension isometric 10x   Other Supine Knee/Hip Exercises ball roll for hip/knee flexion 15x   Other Supine Knee/Hip Exercises lumbar rotation with LEs on ball 15x   Manual Therapy   Manual Therapy Taping   Kinesiotex Facilitate Muscle   Kinesiotix   Facilitate Muscle  Gluteals  Y formation from greater trochanter to gluteus min and medius                  PT Short Term Goals - 07/11/15 1642    PT SHORT TERM GOAL #1   Title Patient will have knowledge of basic self care to promote healing of fracture and soft tissue 08/03/15   Time 4   Period Weeks   Status On-going   PT SHORT TERM GOAL #2   Title The patient will report a 25% improvement in pain with walking, sitting and  exiting car   Time 4   Period Weeks   Status On-going   PT SHORT TERM GOAL #3   Title Right hip IR improved to 25 degrees to aid putting on socks and shoes   Time 4   Period Weeks   Status On-going   PT SHORT TERM GOAL #4   Title Patient will be able to walk 15 min needed for community mobility   Time 4   Period Weeks   Status On-going           PT Long Term Goals - 07/11/15 1642    PT LONG TERM GOAL #1   Title The patient will be independent in safe, self progression of HEP    08/31/15   Time 8   Period Weeks   Status On-going   PT LONG TERM GOAL #2   Title Patient will have 40 degrees of hip external rotation ROM needed for greater ease with putting on socks and shoes.   Time 8   Period Weeks   Status On-going   PT LONG TERM GOAL #3   Title The patient will have right hip strength to 4/5 needed for standing and walking longer periods of time   Time 8   Status On-going   PT LONG  TERM GOAL #4   Title The patient will be able to walk 25-30 minutes for grocery shopping with min complaint of pain   Time 8   Period Weeks   Status On-going   PT LONG TERM GOAL #5   Title FOTO functional outcome score improved from 44% limitation to 32% indicating improved function with less pain   Time 8   Period Weeks   Status On-going               Plan - 07/11/15 1640    Clinical Impression Statement The patient has decreased overall pain although hip flexion combined with external rotation to put on socks is still very painful as well as planting and twisting motions.  Straight plane motions minimally painful.  Patient reports she feels better after treatment session and declines the need for modalties.   Therapist closely monitoring response and modifying as needed.     PT Next Visit Plan Nu-Step for ROM;  assess response to kinesiotaping; e-stim/heat for pain control;  ROM low level ther ex progression      Patient will benefit from skilled therapeutic intervention in order to improve the following deficits and impairments:     Visit Diagnosis: Pain in right hip  Stiffness of right hip, not elsewhere classified  Muscle weakness (generalized)     Problem List Patient Active Problem List   Diagnosis Date Noted  . Daytime hypersomnolence 11/22/2014  . HTN (hypertension) 11/15/2014  . Dyspnea 11/01/2014  . Chronic diarrhea 05/06/2014  . Vitamin D deficiency 05/06/2014  . Gastrointestinal stromal tumor (GIST) of stomach 05/06/2014  . Anxiety state 05/06/2014  . Diabetes mellitus 05/07/2011   Ruben Im, PT 07/11/2015 5:00 PM Phone: [phone] Fax: [phone] Alvera Singh 07/11/2015, 4:59 PM  [facility] [address], [place], [place], [address] Phone: [phone]   Fax:  [phone]  Name: Jasmine Martinez MRN: [id number] Date of Birth: 07-05-1970

## 2015-07-15 DIAGNOSIS — M25551 Pain in right hip: Secondary | ICD-10-CM | POA: Diagnosis not present

## 2015-07-15 DIAGNOSIS — S76011A Strain of muscle, fascia and tendon of right hip, initial encounter: Secondary | ICD-10-CM | POA: Diagnosis not present

## 2015-07-17 ENCOUNTER — Ambulatory Visit: Payer: 59 | Attending: Orthopedic Surgery | Admitting: Rehabilitative and Restorative Service Providers"

## 2015-07-17 DIAGNOSIS — M6281 Muscle weakness (generalized): Secondary | ICD-10-CM | POA: Insufficient documentation

## 2015-07-17 DIAGNOSIS — M25551 Pain in right hip: Secondary | ICD-10-CM | POA: Diagnosis not present

## 2015-07-17 NOTE — Patient Instructions (Signed)
Advised pt she may be sore on the next day and to perform HEP and use heat as needed for further muscle relaxation.

## 2015-07-17 NOTE — Therapy (Signed)
[facility] [address], [place], [place], [address] Phone: [phone]   Fax:  [phone]  Physical Therapy Treatment  Patient Details  Name: Jasmine Martinez MRN: [id number] Date of Birth: 10/16/1970 Referring Provider: Dr. Delilah Shan  Encounter Date: 07/17/2015      PT End of Session - 07/17/15 1600    Visit Number 3   Date for PT Re-Evaluation 09/11/15   Authorization Type [facility]   PT Start Time 1400   PT Stop Time 1445   PT Time Calculation (min) 45 min   Activity Tolerance Patient tolerated treatment well   Behavior During Therapy [facility] for tasks assessed/performed      Past Medical History  Diagnosis Date  . Diabetes mellitus   . Hyperlipidemia   . Obesity   . HTN (hypertension)   . Gastrointestinal stromal tumor (GIST)     removed 2016    Past Surgical History  Procedure Laterality Date  . Appendectomy    . Cholecystectomy    . Tonsillectomy    . Eus N/A 03/23/2014    Procedure: ESOPHAGEAL ENDOSCOPIC ULTRASOUND (EUS) RADIAL;  Surgeon: Arta Silence, MD;  Location: [facility];  Service: Endoscopy;  Laterality: N/A;  . Fine needle aspiration N/A 03/23/2014    Procedure: FINE NEEDLE ASPIRATION (FNA) LINEAR;  Surgeon: Arta Silence, MD;  Location: [facility];  Service: Endoscopy;  Laterality: N/A;    There were no vitals filed for this visit.      Subjective Assessment - 07/17/15 1551    Subjective I went to Dr. Delilah Shan today and he told me I don't have a break. I am switching doctors. I have a Glute Min tear.   Pertinent History hx of GI cancer stroma   Limitations Walking;Standing   How long can you sit comfortably? will start to burn 30-40 min   How long can you stand comfortably? 6 min   How long can you walk comfortably? 5 min   Diagnostic tests MRI   Patient Stated Goals be able to put my shoe on myself, put socks on   Currently in Pain? Yes   Pain Score 4    Pain Location Hip    Pain Orientation Right   Pain Descriptors / Indicators Aching;Guarding   Pain Type Other (Comment)   Pain Radiating Towards Glute Min   Pain Onset 1 to 4 weeks ago   Pain Frequency Intermittent   Aggravating Factors  putting on socks/shoes, walking, transferring   Pain Relieving Factors supine, heat   Effect of Pain on Daily Activities moderate dependent on activity   Multiple Pain Sites No            [facility] PT Assessment - 07/17/15 0001    Assessment   Medical Diagnosis right greater trochanter fx   Referring Provider Dr. Delilah Shan   Onset Date/Surgical Date 06/22/15   Next MD Visit 4 weeks   Prior Therapy ankle sprain years ago   Precautions   Precautions None   Restrictions   Weight Bearing Restrictions No   Balance Screen   Has the patient fallen in the past 6 months Yes   How many times? 1  on ice   Has the patient had a decrease in activity level because of a fear of falling?  No   Is the patient reluctant to leave their home because of a fear of falling?  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   Observation/Other Assessments   Focus on Therapeutic Outcomes (FOTO)  44% limitation   ROM / Strength   AROM / PROM / Strength AROM;Strength   AROM   AROM Assessment Site --  Hip; Knee   Right/Left Hip --  Right   Right Hip Extension 12   Right Hip Flexion 93   Right Hip External Rotation  13  painful   Right Hip Internal Rotation  10   Right Hip ABduction 10  painful   Strength   Overall Strength Comments Right single leg stand 4 sec   Strength Assessment Site --  Hip/Knee   Right Hip Flexion 4-/5    Right Hip Extension 4-/5   Right Hip External Rotation  3/5   Right Hip Internal Rotation 3/5   Right Hip ABduction 3-/5   Flexibility   Hamstrings right 70, left 80   Palpation   Palpation comment tender over greater trochanter, minor tenderness gluteals   Ambulation/Gait   Gait Comments Short distance with symmetrical stride lengths and stance times;  no visible limp with short distances                     [facility] Adult PT Treatment/Exercise - 07/17/15 0001    Knee/Hip Exercises: Aerobic   Recumbent Bike L2 x 5 min with PT verbal cues for Quad contraction   Knee/Hip Exercises: Standing   Other Standing Knee Exercises R 6 inch step taps laterally x 20, R 6 inch step ups x 20, R standing ER/hip abdct combo with knee ext x 20; R diagonal hip ext x 20; plies x 20, L hip clock x 20 to increase R LE WB   Knee/Hip Exercises: Seated   Other Seated Knee/Hip Exercises seated alt hip ER AROM x 20; seated trunk flexion 3x15 sec   Knee/Hip Exercises: Supine   Other Supine Knee/Hip Exercises bridge x 20, Single limb bridge x 20; bridge with march x 20, R knee to chest 2x30 sec; R knee to opposite shoulder 2x30 sec                  PT Short Term Goals - 07/17/15 1514    PT SHORT TERM GOAL #1   Title Patient will have knowledge of basic self care to promote healing of fracture and soft tissue 08/03/15   Time 4   Period Weeks   Status On-going   PT SHORT TERM GOAL #2   Title The patient will report a 25% improvement in pain with walking, sitting and exiting car   Time 4   Period Weeks   Status On-going   PT SHORT TERM GOAL #3   Title Right hip IR improved to 25 degrees to aid putting on socks and shoes   Time 4   Period Weeks   Status On-going   PT SHORT TERM GOAL #4   Title Patient will be able to walk 15 min needed for community mobility   Time 4   Period Weeks   Status On-going           PT Long Term Goals -  07/17/15 1528    PT LONG TERM GOAL #1    Title The patient will be independent in safe, self progression of HEP    08/31/15   Time 8   Period Weeks   Status On-going   PT LONG TERM GOAL #2   Title Patient will have 40 degrees of hip external rotation ROM needed for greater ease with putting on socks and shoes.   Time 8   Period Weeks   Status On-going   PT LONG TERM GOAL #3   Title The patient will have right hip strength to 4/5 needed for standing and walking longer periods of time   Time 8   Period Weeks   Status On-going   PT LONG TERM GOAL #4   Title The patient will be able to walk 25-30 minutes for grocery shopping with min complaint of pain   Time 8   Period Weeks   Status On-going   PT LONG TERM GOAL #5   Title FOTO functional outcome score improved from 44% limitation to 32% indicating improved function with less pain   Time 8   Period Weeks   Status On-going               Plan - 07/17/15 1448    Clinical Impression Statement The pt has decreased R hip ROM all planes and strength which is hindering functional mobility with emphasis on ER and hip flexion to don/doff socks/shoes. All motions are painful but with emphasis on combined movements.   Rehab Potential Good   Clinical Impairments Affecting Rehab Potential history of GI stromal tumor-no [place]   PT Frequency 2x / week   PT Duration 8 weeks   PT Treatment/Interventions ADLs/Self Care Home Management;Cryotherapy;Electrical Stimulation;Iontophoresis 4mg /ml Dexamethasone;Moist Heat;Therapeutic exercise;Patient/family education;Dry needling;Taping;Manual techniques   PT Next Visit Plan continued R glute min/glute med strengthening; modalities as needed; R hip flexibility exercises   PT Home Exercise Plan will continue to progress HEP   Recommended Other Services none   Consulted and Agree with Plan of Care Patient      Patient will benefit from skilled therapeutic intervention in order to improve the following deficits and impairments:  Abnormal gait,  Decreased strength, Difficulty walking, Hypomobility, Impaired flexibility  Visit Diagnosis: Pain in right hip - Plan: PT plan of care cert/re-cert  Muscle weakness (generalized) - Plan: PT plan of care cert/re-cert     Problem List Patient Active Problem List   Diagnosis Date Noted  . Daytime hypersomnolence 11/22/2014  . HTN (hypertension) 11/15/2014  . Dyspnea 11/01/2014  . Chronic diarrhea 05/06/2014  . Vitamin D deficiency 05/06/2014  . Gastrointestinal stromal tumor (GIST) of stomach 05/06/2014  . Anxiety state 05/06/2014  . Diabetes mellitus 05/07/2011    Myra Rude, PT 07/17/2015, 4:03 PM   [facility] [address] [place], [place], [address] Phone: [phone]   Fax:  [phone]  Name: Jasmine Martinez MRN: [id number] Date of Birth: 1970-03-28

## 2015-07-19 ENCOUNTER — Encounter: Payer: 59 | Admitting: Physical Therapy

## 2015-07-20 ENCOUNTER — Encounter: Payer: 59 | Admitting: Physical Therapy

## 2015-07-26 ENCOUNTER — Encounter: Payer: Self-pay | Admitting: Physical Therapy

## 2015-07-26 ENCOUNTER — Ambulatory Visit: Payer: 59 | Attending: Sports Medicine | Admitting: Physical Therapy

## 2015-07-26 DIAGNOSIS — M6281 Muscle weakness (generalized): Secondary | ICD-10-CM | POA: Insufficient documentation

## 2015-07-26 DIAGNOSIS — M25651 Stiffness of right hip, not elsewhere classified: Secondary | ICD-10-CM | POA: Diagnosis not present

## 2015-07-26 DIAGNOSIS — M25551 Pain in right hip: Secondary | ICD-10-CM | POA: Diagnosis not present

## 2015-07-26 NOTE — Therapy (Signed)
[facility] [address] [place], [place], [address] Phone: [phone]   Fax:  [phone]  Physical Therapy Treatment  Patient Details  Name: Jasmine Martinez MRN: [id number] Date of Birth: 09-14-1970 Referring Provider: Dr. Delilah Shan  Encounter Date: 07/26/2015      PT End of Session - 07/26/15 0805    Visit Number 4   Date for PT Re-Evaluation 09/11/15   Authorization Type [facility]   PT Start Time 0800   PT Stop Time 0840   PT Time Calculation (min) 40 min   Activity Tolerance Patient tolerated treatment well   Behavior During Therapy [facility] for tasks assessed/performed      Past Medical History  Diagnosis Date  . Diabetes mellitus   . Hyperlipidemia   . Obesity   . HTN (hypertension)   . Gastrointestinal stromal tumor (GIST)     removed 2016    Past Surgical History  Procedure Laterality Date  . Appendectomy    . Cholecystectomy    . Tonsillectomy    . Eus N/A 03/23/2014    Procedure: ESOPHAGEAL ENDOSCOPIC ULTRASOUND (EUS) RADIAL;  Surgeon: Arta Silence, MD;  Location: [facility];  Service: Endoscopy;  Laterality: N/A;  . Fine needle aspiration N/A 03/23/2014    Procedure: FINE NEEDLE ASPIRATION (FNA) LINEAR;  Surgeon: Arta Silence, MD;  Location: [facility];  Service: Endoscopy;  Laterality: N/A;    There were no vitals filed for this visit.      Subjective Assessment - 07/26/15 0803    Subjective Bad day yesterday, had a hard time sitting. Last session was too hard, I could not walk the rest of the day and the next.    Currently in Pain? Yes   Pain Score 2    Pain Location Hip   Pain Orientation Right   Pain Descriptors / Indicators Dull;Aching   Aggravating Factors  Maybe overdoing it   Pain Relieving Factors rest, moving a little   Multiple Pain Sites No                         [facility] Adult PT Treatment/Exercise - 07/26/15 0001    Knee/Hip Exercises: Stretches   Other Knee/Hip Stretches Supine IR/ER hip rolls 10x   Knee/Hip Exercises: Standing   Other Standing Knee Exercises Green spikey ball glute medius release at the wall.  Soft foam roll for ITband against wall10x   Other Standing Knee Exercises S/L soft foam roll rlelease to glute medius.   Manual Therapy   Manual Therapy --  Soft tissue work to Rt glute medius and IT band in sidelying                  PT Short Term Goals - 07/26/15 0806    PT SHORT TERM GOAL #2   Title The patient will report a 25% improvement in pain with walking, sitting and exiting car   Time 4   Period Weeks   Status Achieved   PT SHORT TERM GOAL #3   Title Right hip IR improved to 25 degrees to aid putting on socks and shoes   Time 4   Period Weeks   Status On-going   PT SHORT TERM GOAL #4   Title Patient will be able to walk 15 min needed for community mobility   Time 4   Period Weeks   Status Achieved           PT Long  Term Goals - 07/17/15 1528    PT LONG TERM GOAL #1   Title The patient will be independent in safe, self progression of HEP    08/31/15   Time 8   Period Weeks   Status On-going   PT LONG TERM GOAL #2   Title Patient will have 40 degrees of hip external rotation ROM needed for greater ease with putting on socks and shoes.   Time 8   Period Weeks   Status On-going   PT LONG TERM GOAL #3   Title The patient will have right hip strength to 4/5 needed for standing and walking longer periods of time   Time 8   Period Weeks   Status On-going   PT LONG TERM GOAL #4   Title The patient will be able to walk 25-30 minutes for grocery shopping with min complaint of pain   Time 8   Period Weeks   Status On-going   PT LONG TERM GOAL #5   Title FOTO functional outcome score improved from 44% limitation to 32% indicating improved function with less pain   Time 8   Period Weeks   Status On-going               Plan - 07/26/15 0806    Clinical Impression Statement Pt  very busy lately which seems to be why she is having increased pain. Despite the increased pain over the last few days she has met most of her STGs. Mainly putting shoes and socks on is not much better. Introduced release techniques today she can do at home using green spikey ball and soft foam roll.    Rehab Potential Good   Clinical Impairments Affecting Rehab Potential history of GI stromal tumor-no [place]   PT Frequency 2x / week   PT Duration 8 weeks   PT Treatment/Interventions ADLs/Self Care Home Management;Cryotherapy;Electrical Stimulation;Iontophoresis 23m/ml Dexamethasone;Moist Heat;Therapeutic exercise;Patient/family education;Dry needling;Taping;Manual techniques   Consulted and Agree with Plan of Care Patient      Patient will benefit from skilled therapeutic intervention in order to improve the following deficits and impairments:  Abnormal gait, Decreased strength, Difficulty walking, Hypomobility, Impaired flexibility  Visit Diagnosis: Pain in right hip  Muscle weakness (generalized)  Stiffness of right hip, not elsewhere classified     Problem List Patient Active Problem List   Diagnosis Date Noted  . Daytime hypersomnolence 11/22/2014  . HTN (hypertension) 11/15/2014  . Dyspnea 11/01/2014  . Chronic diarrhea 05/06/2014  . Vitamin D deficiency 05/06/2014  . Gastrointestinal stromal tumor (GIST) of stomach 05/06/2014  . Anxiety state 05/06/2014  . Diabetes mellitus 05/07/2011    Jasmine Martinez,Jasmine Martinez, PTA 07/26/2015, 8:41 AM  [facility] [address] [place] [address]Phone: [phone]  Fax:  [phone] Name: Jasmine SORIAMRN: [id number] of Birth: [birth year]

## 2015-07-31 ENCOUNTER — Ambulatory Visit: Payer: 59 | Admitting: Physical Therapy

## 2015-08-02 ENCOUNTER — Encounter: Payer: Self-pay | Admitting: Physical Therapy

## 2015-08-02 ENCOUNTER — Ambulatory Visit: Payer: 59 | Admitting: Physical Therapy

## 2015-08-02 DIAGNOSIS — M25551 Pain in right hip: Secondary | ICD-10-CM

## 2015-08-02 DIAGNOSIS — M6281 Muscle weakness (generalized): Secondary | ICD-10-CM

## 2015-08-02 DIAGNOSIS — M25651 Stiffness of right hip, not elsewhere classified: Secondary | ICD-10-CM

## 2015-08-02 NOTE — Therapy (Addendum)
[facility] [address] [place], [place], [address] Phone: [phone]   Fax:  [phone]  Physical Therapy Treatment/Discharge Summary  Patient Details  Name: Jasmine Martinez MRN: [id number] Date of Birth: 1970-07-19 Referring Provider: Dr. Delilah Shan  Encounter Date: 08/02/2015      PT End of Session - 08/02/15 0803    Visit Number 5   Date for PT Re-Evaluation 09/11/15   Authorization Type [facility]   PT Start Time 0800   PT Stop Time 0845   PT Time Calculation (min) 45 min   Activity Tolerance Patient tolerated treatment well   Behavior During Therapy [facility] for tasks assessed/performed      Past Medical History  Diagnosis Date  . Diabetes mellitus   . Hyperlipidemia   . Obesity   . HTN (hypertension)   . Gastrointestinal stromal tumor (GIST)     removed 2016    Past Surgical History  Procedure Laterality Date  . Appendectomy    . Cholecystectomy    . Tonsillectomy    . Eus N/A 03/23/2014    Procedure: ESOPHAGEAL ENDOSCOPIC ULTRASOUND (EUS) RADIAL;  Surgeon: Arta Silence, MD;  Location: [facility];  Service: Endoscopy;  Laterality: N/A;  . Fine needle aspiration N/A 03/23/2014    Procedure: FINE NEEDLE ASPIRATION (FNA) LINEAR;  Surgeon: Arta Silence, MD;  Location: [facility];  Service: Endoscopy;  Laterality: N/A;    There were no vitals filed for this visit.      Subjective Assessment - 08/02/15 0803    Subjective I was not as sore after the last tx, had some soreness but ok. Pain typically when I do something, "use it."   Currently in Pain? No/denies   Multiple Pain Sites No            [facility] PT Assessment - 08/02/15 0001    AROM   Right Hip External Rotation  40   Right Hip Internal Rotation  22   Strength   Right Hip External Rotation  4/5   Right Hip Internal Rotation 3+/5   Right Hip ABduction 5/5                     [facility] Adult PT Treatment/Exercise -  08/02/15 0001    Knee/Hip Exercises: Stretches   Other Knee/Hip Stretches Supine IR/ER hip rolls 10x   Other Knee/Hip Stretches Glute medius stretch 3 x20 sec  Soft foam roll release work to glute medius: all to pt's tol   Knee/Hip Exercises: Standing   Walking with Sports Cord 25# 10x each direction  VC to be equal RT & LT, pt was fearful   Other Standing Knee Exercises IT band release against wall with foam roll: 2 x10                   PT Short Term Goals - 08/02/15 0812    PT SHORT TERM GOAL #1   Title Patient will have knowledge of basic self care to promote healing of fracture and soft tissue 08/03/15   Time 4   Period Weeks   Status Achieved           PT Long Term Goals - 08/02/15 0813    PT LONG TERM GOAL #4   Title The patient will be able to walk 25-30 minutes for grocery shopping with min complaint of pain   Time 8   Period Weeks   Status Achieved  Plan - 08/02/15 0803    Clinical Impression Statement Pt reports she is walking all the time, still has some pain but it is more tolerable when she does walk all day. Putting shoes/socks is 50% better at this time, all STGs met. Hip rotation AROM is WNL and her MMT was significantly improved. IR remains weak.    Rehab Potential Good   Clinical Impairments Affecting Rehab Potential history of GI stromal tumor-no [place]   PT Frequency 2x / week   PT Duration 8 weeks   PT Treatment/Interventions ADLs/Self Care Home Management;Cryotherapy;Electrical Stimulation;Iontophoresis 39m/ml Dexamethasone;Moist Heat;Therapeutic exercise;Patient/family education;Dry needling;Taping;Manual techniques   PT Next Visit Plan RT functional/standing glute strength   Consulted and Agree with Plan of Care Patient      Patient will benefit from skilled therapeutic intervention in order to improve the following deficits and impairments:  Abnormal gait, Decreased strength, Difficulty walking, Hypomobility, Impaired  flexibility  Visit Diagnosis: Pain in right hip  Muscle weakness (generalized)  Stiffness of right hip, not elsewhere classified    PHYSICAL THERAPY DISCHARGE SUMMARY  Visits from Start of Care: 5  Current functional level related to goals / functional outcomes: The patient called and cancelled her last 2 scheduled appointments, no reason given.  She has not called to reschedule and her chart has been inactive for > 6 weeks.  Will discharge from PT at this time with partial goals met.     Remaining deficits: As above   Education / Equipment: Basic HEP Plan: Patient agrees to discharge.  Patient goals were partially met. Patient is being discharged due to not returning since the last visit.  ?????        Problem List Patient Active Problem List   Diagnosis Date Noted  . Daytime hypersomnolence 11/22/2014  . HTN (hypertension) 11/15/2014  . Dyspnea 11/01/2014  . Chronic diarrhea 05/06/2014  . Vitamin D deficiency 05/06/2014  . Gastrointestinal stromal tumor (GIST) of stomach 05/06/2014  . Anxiety state 05/06/2014  . Diabetes mellitus 05/07/2011   SRuben Im PT 09/22/15 9:21 AM Phone: [phone]: [phone] COCHRAN,Shandreka, PTA 08/02/2015, 8:35 AM  [facility] [address] [place] [address]Phone: [phone]  Fax:  [phone] Name: Jasmine WALLEMRN: [id number] of Birth: 702-08-1970

## 2015-08-15 DIAGNOSIS — F330 Major depressive disorder, recurrent, mild: Secondary | ICD-10-CM | POA: Diagnosis not present

## 2015-08-15 DIAGNOSIS — F411 Generalized anxiety disorder: Secondary | ICD-10-CM | POA: Diagnosis not present

## 2015-08-17 ENCOUNTER — Telehealth: Payer: Self-pay

## 2015-08-17 DIAGNOSIS — E118 Type 2 diabetes mellitus with unspecified complications: Secondary | ICD-10-CM

## 2015-08-17 NOTE — Telephone Encounter (Signed)
Scheduled upcoming lab visit.

## 2015-08-24 ENCOUNTER — Other Ambulatory Visit: Payer: 59 | Admitting: Internal Medicine

## 2015-08-24 DIAGNOSIS — E119 Type 2 diabetes mellitus without complications: Secondary | ICD-10-CM | POA: Diagnosis not present

## 2015-08-25 LAB — HEMOGLOBIN A1C
Hgb A1c MFr Bld: 6.3 % — ABNORMAL HIGH (ref <5.7)
Mean Plasma Glucose: 134 mg/dL

## 2015-08-25 LAB — MICROALBUMIN / CREATININE URINE RATIO
CREATININE, URINE: 69 mg/dL (ref 20–320)
MICROALB UR: 0.3 mg/dL
MICROALB/CREAT RATIO: 4 ug/mg{creat} (ref <30)

## 2015-09-12 DIAGNOSIS — F411 Generalized anxiety disorder: Secondary | ICD-10-CM | POA: Diagnosis not present

## 2015-09-12 DIAGNOSIS — F330 Major depressive disorder, recurrent, mild: Secondary | ICD-10-CM | POA: Diagnosis not present

## 2015-10-09 MED FILL — VICTOZA 18 MG/3 ML INJECT P: 18 | 30 days supply | Qty: 9 | Fill #1

## 2015-10-17 DIAGNOSIS — F330 Major depressive disorder, recurrent, mild: Secondary | ICD-10-CM | POA: Diagnosis not present

## 2015-10-17 DIAGNOSIS — F411 Generalized anxiety disorder: Secondary | ICD-10-CM | POA: Diagnosis not present

## 2015-10-25 DIAGNOSIS — F330 Major depressive disorder, recurrent, mild: Secondary | ICD-10-CM | POA: Diagnosis not present

## 2015-10-25 DIAGNOSIS — F411 Generalized anxiety disorder: Secondary | ICD-10-CM | POA: Diagnosis not present

## 2015-10-31 DIAGNOSIS — F330 Major depressive disorder, recurrent, mild: Secondary | ICD-10-CM | POA: Diagnosis not present

## 2015-10-31 DIAGNOSIS — F411 Generalized anxiety disorder: Secondary | ICD-10-CM | POA: Diagnosis not present

## 2015-11-07 DIAGNOSIS — F330 Major depressive disorder, recurrent, mild: Secondary | ICD-10-CM | POA: Diagnosis not present

## 2015-11-07 DIAGNOSIS — F411 Generalized anxiety disorder: Secondary | ICD-10-CM | POA: Diagnosis not present

## 2015-11-07 MED FILL — VICTOZA 18 MG/3 ML INJECT P: 18 | 60 days supply | Qty: 18 | Fill #2

## 2015-11-21 DIAGNOSIS — F330 Major depressive disorder, recurrent, mild: Secondary | ICD-10-CM | POA: Diagnosis not present

## 2015-11-21 DIAGNOSIS — F411 Generalized anxiety disorder: Secondary | ICD-10-CM | POA: Diagnosis not present

## 2015-11-28 DIAGNOSIS — F411 Generalized anxiety disorder: Secondary | ICD-10-CM | POA: Diagnosis not present

## 2015-11-28 DIAGNOSIS — F330 Major depressive disorder, recurrent, mild: Secondary | ICD-10-CM | POA: Diagnosis not present

## 2015-12-12 DIAGNOSIS — F330 Major depressive disorder, recurrent, mild: Secondary | ICD-10-CM | POA: Diagnosis not present

## 2015-12-12 DIAGNOSIS — F411 Generalized anxiety disorder: Secondary | ICD-10-CM | POA: Diagnosis not present

## 2015-12-19 DIAGNOSIS — F330 Major depressive disorder, recurrent, mild: Secondary | ICD-10-CM | POA: Diagnosis not present

## 2015-12-19 DIAGNOSIS — F411 Generalized anxiety disorder: Secondary | ICD-10-CM | POA: Diagnosis not present

## 2015-12-21 ENCOUNTER — Encounter (INDEPENDENT_AMBULATORY_CARE_PROVIDER_SITE_OTHER): Payer: 59 | Admitting: Family Medicine

## 2016-01-02 ENCOUNTER — Ambulatory Visit (INDEPENDENT_AMBULATORY_CARE_PROVIDER_SITE_OTHER): Payer: 59 | Admitting: Family Medicine

## 2016-01-02 ENCOUNTER — Encounter (INDEPENDENT_AMBULATORY_CARE_PROVIDER_SITE_OTHER): Payer: Self-pay | Admitting: Family Medicine

## 2016-01-02 VITALS — BP 139/82 | Temp 98.1°F | Resp 12 | Ht 65.0 in | Wt 260.6 lb

## 2016-01-02 DIAGNOSIS — R0609 Other forms of dyspnea: Secondary | ICD-10-CM | POA: Diagnosis not present

## 2016-01-02 DIAGNOSIS — Z800 Family history of malignant neoplasm of digestive organs: Secondary | ICD-10-CM | POA: Diagnosis not present

## 2016-01-02 DIAGNOSIS — F3281 Premenstrual dysphoric disorder: Secondary | ICD-10-CM

## 2016-01-02 DIAGNOSIS — I10 Essential (primary) hypertension: Secondary | ICD-10-CM | POA: Diagnosis not present

## 2016-01-02 DIAGNOSIS — E118 Type 2 diabetes mellitus with unspecified complications: Secondary | ICD-10-CM | POA: Diagnosis not present

## 2016-01-02 DIAGNOSIS — R5383 Other fatigue: Secondary | ICD-10-CM

## 2016-01-02 DIAGNOSIS — R0602 Shortness of breath: Secondary | ICD-10-CM | POA: Diagnosis not present

## 2016-01-02 DIAGNOSIS — E559 Vitamin D deficiency, unspecified: Secondary | ICD-10-CM

## 2016-01-02 DIAGNOSIS — Z1331 Encounter for screening for depression: Secondary | ICD-10-CM

## 2016-01-02 DIAGNOSIS — Z1389 Encounter for screening for other disorder: Secondary | ICD-10-CM

## 2016-01-02 DIAGNOSIS — Z9189 Other specified personal risk factors, not elsewhere classified: Secondary | ICD-10-CM | POA: Diagnosis not present

## 2016-01-02 DIAGNOSIS — R0600 Dyspnea, unspecified: Secondary | ICD-10-CM

## 2016-01-02 HISTORY — DX: Premenstrual dysphoric disorder: F32.81

## 2016-01-02 NOTE — Progress Notes (Signed)
Office: [phone]  /  Fax: [phone]   HPI:   Chief Complaint: OBESITY  Jasmine Martinez (MR# [id number]) is a 45 y.o. female who presents on 01/02/2016 for obesity evaluation and treatment. Current BMI is Body mass index is 43.37 kg/m.Jasmine Martinez Jasmine Martinez has struggled with obesity for years and has been unsuccessful in either losing weight or maintaining long term weight loss. Liba attended our information session and states she is currently in the action stage of change and ready to dedicate time achieving and maintaining a healthier weight.  Jasmine Martinez states she struggles with family and or coworkers weight loss sabotage she has significant food cravings issues  she snacks frequently in the evenings she skips meals frequently she frequently makes poor food choices she frequently eats larger portions than normal  she struggles with emotional eating    Fatigue Jasmine Martinez feels her energy is lower than it should be. This has worsened with weight gain and has not worsened recently. Jasmine Martinez reports daytime somnolence and  reports waking up still tired. Patient is at risk for obstructive sleep apnea. Patent has a history of symptoms of morning fatigue. Patient generally gets 6 hours of sleep per night, and states they generally have nightime awakenings. Snoring is present. Apneic episodes are not present. Epworth Sleepiness Score is 4  Dyspnea on exertion [name] notes increasing shortness of breath with exercising and seems to be worsening over time with weight gain. She notes getting out of breath sooner with activity than she used to. This has not gotten worse recently. Ronneshia denies orthopnea.  DMII Jasmine Martinez has a diagnosis of diabetes type II. Jasmine Martinez states patient does not check sugars and denies any hypoglycemic episodes. Last A1c was close to 7.0 per pt. Hemoglobin A1C Latest Ref Rng & Units 08/24/2015 02/14/2015  HGBA1C <5.7 % 6.3(H) 6  Some recent data might be hidden   She  has been working on intensive lifestyle modifications including diet, exercise, and weight loss to help control her blood glucose levels.  Vitamin D deficiency [name] has a diagnosis of vitamin D deficiency. She is not currently taking vit D and denies nausea, vomiting or muscle weakness.  [name] has a strong family history of colon cancer in multiple family members, and she herself has a history of a gastrointestinal stromal tumor which is partially resected. She states she is still at risk of her cancer progressing and metastasizing. She has an appoint with her oncologist tomorrow to discuss next options.  Depression Screen Jasmine Martinez's Food and Mood (modified PHQ-9) score was  Depression screen PHQ 2/9 01/02/2016  Decreased Interest 1  Down, Depressed, Hopeless 1  PHQ - 2 Score 2  Altered sleeping 2  Tired, decreased energy 3  Change in appetite 3  Feeling bad or failure about yourself  3  Trouble concentrating 0  Moving slowly or fidgety/restless 0  Suicidal thoughts 0  PHQ-9 Score 13  Difficult doing work/chores -    ALLERGIES: Allergies  Allergen Reactions  . Codeine Shortness Of Breath  . Hydrocodone Shortness Of Breath    Mild SOB    MEDICATIONS: Current Outpatient Prescriptions on File Prior to Visit  Medication Sig Dispense Refill  . ALPRAZolam (XANAX) 0.5 MG tablet Take 1 tablet (0.5 mg total) by mouth at bedtime. 90 tablet 0  . butalbital-acetaminophen-caffeine (FIORICET, ESGIC) 50-325-40 MG per tablet TAKE 1-2 TABLETS BY MOUTH EVERY 6 HOURS AS NEEDED FOR HEADACHE 20 tablet 0  . levonorgestrel (MIRENA) 20 MCG/24HR IUD 1 each by Intrauterine route once.    Jasmine Martinez  NOVOFINE 32G X 6 MM MISC Inject 1 Stick as directed daily. 90 each 5  . TRUE METRIX BLOOD GLUCOSE TEST test strip Apply 1 strip topically daily.  3  . TRUEPLUS LANCETS 30G MISC Inject 1 Stick as directed daily. 100 each 6  . VICTOZA 18 MG/3ML SOPN Inject 0.3 mLs (1.8 mg total) into the skin daily. 6 mL 5  .  aspirin EC 81 MG tablet Take 81 mg by mouth daily.    . cyclobenzaprine (FLEXERIL) 10 MG tablet Take 1 tablet (10 mg total) by mouth 3 (three) times daily as needed for muscle spasms. (Patient not taking: Reported on 01/02/2016) 45 tablet 4  . HYDROmorphone (DILAUDID) 4 MG tablet Take 1 tablet (4 mg total) by mouth every 4 (four) hours as needed for severe pain. (Patient not taking: Reported on 01/02/2016) 30 tablet 0  . lidocaine (LIDODERM) 5 % Place 1 patch onto the skin daily. Remove & Discard patch within 12 hours or as directed by MD (Patient not taking: Reported on 01/02/2016) 30 patch 0  . Multiple Vitamin (MULTIVITAMIN WITH MINERALS) TABS tablet Take 1 tablet by mouth daily.    . Vitamin D, Ergocalciferol, (DRISDOL) 50000 UNITS CAPS capsule Take 50,000 Units by mouth every 7 (seven) days.     No current facility-administered medications on file prior to visit.     PAST MEDICAL HISTORY: Past Medical History:  Diagnosis Date  . Anxiety   . Back pain   . Chest pain   . Depression   . Diabetes mellitus   . Diarrhea   . Edema    feet and legs  . Gallbladder problem   . Gastrointestinal stromal tumor (GIST) ([place])    removed 2016  . GERD (gastroesophageal reflux disease)   . Heartburn   . HTN (hypertension)   . Hyperlipidemia   . Obesity   . Palpitations   . PMDD (premenstrual dysphoric disorder) 01/02/2016  . Shortness of breath   . Vitamin D deficiency   . Vitamin D deficiency     PAST SURGICAL HISTORY: Past Surgical History:  Procedure Laterality Date  . APPENDECTOMY    . CHOLECYSTECTOMY    . EUS N/A 03/23/2014   Procedure: ESOPHAGEAL ENDOSCOPIC ULTRASOUND (EUS) RADIAL;  Surgeon: Arta Silence, MD;  Location: [facility];  Service: Endoscopy;  Laterality: N/A;  . FINE NEEDLE ASPIRATION N/A 03/23/2014   Procedure: FINE NEEDLE ASPIRATION (FNA) LINEAR;  Surgeon: Arta Silence, MD;  Location[facility];  Service: Endoscopy;  Laterality: N/A;  . TONSILLECTOMY       SOCIAL HISTORY: Social History  Substance Use Topics  . Smoking status: Never Smoker  . Smokeless tobacco: Never Used  . Alcohol use No    FAMILY HISTORY: Family History  Problem Relation Age of Onset  . Healthy Mother   . Hypertension Mother   . Hyperlipidemia Mother   . Alcoholism Mother   . Anxiety disorder Mother   . Drug abuse Mother   . Healthy Father   . Colon cancer Paternal Grandfather 17    colon    ROS: ROS  PHYSICAL EXAM: Blood pressure 139/82, temperature 98.1 F (36.7 C), temperature source Oral, resp. rate 12, height 5\' 5"  (1.651 m), weight 260 lb 9.8 oz (118.2 kg), SpO2 100 %. Body mass index is 43.37 kg/m. Physical Exam  RECENT LABS AND TESTS: BMET    Component Value Date/Time   NA 137 09/26/2014 2012   K 3.7 09/26/2014 2012   CL 102 09/26/2014 2012  CO2 26 09/26/2014 2012   GLUCOSE 99 09/26/2014 2012   BUN 7 09/26/2014 2012   CREATININE 0.52 09/26/2014 2012   CREATININE 0.65 05/05/2014 0902   CALCIUM 9.4 09/26/2014 2012   GFRNONAA >60 09/26/2014 2012   GFRNONAA >89 05/05/2014 0902   GFRAA >60 09/26/2014 2012   GFRAA >89 05/05/2014 0902   Lab Results  Component Value Date   HGBA1C 6.3 (H) 08/24/2015   No results found for: INSULIN CBC    Component Value Date/Time   WBC 10.7 (H) 09/26/2014 2012   RBC 4.53 09/26/2014 2012   HGB 13.6 09/26/2014 2012   HCT 40.0 09/26/2014 2012   PLT 323 09/26/2014 2012   MCV 88.3 09/26/2014 2012   MCH 30.0 09/26/2014 2012   MCHC 34.0 09/26/2014 2012   RDW 12.5 09/26/2014 2012   LYMPHSABS 2.2 05/05/2014 0902   MONOABS 0.6 05/05/2014 0902   EOSABS 0.1 05/05/2014 0902   BASOSABS 0.0 05/05/2014 0902   Iron/TIBC/Ferritin/ %Sat No results found for: IRON, TIBC, FERRITIN, IRONPCTSAT Lipid Panel     Component Value Date/Time   CHOL 165 05/05/2014 0902   TRIG 81 05/05/2014 0902   HDL 48 05/05/2014 0902   CHOLHDL 3.4 05/05/2014 0902   VLDL 16 05/05/2014 0902   LDLCALC 101 (H) 05/05/2014 0902    Hepatic Function Panel     Component Value Date/Time   PROT 7.3 05/05/2014 0902   ALBUMIN 4.0 05/05/2014 0902   AST 12 05/05/2014 0902   ALT 14 05/05/2014 0902   ALKPHOS 109 05/05/2014 0902   BILITOT 0.5 05/05/2014 0902      Component Value Date/Time   TSH 2.053 05/05/2014 0902    ECG done today shows NSR @ 65 BPM with possible LF Block, unchanged from last year INDIRECT CALORIMETER done today shows a VO2 of 220 and a REE of 1526 which is lower than expected..    ASSESSMENT AND PLAN: Other fatigue - Plan: EKG 12-Lead, Comprehensive metabolic panel, CBC With Differential, VITAMIN D 25 Hydroxy (Vit-D Deficiency, Fractures), Vitamin B12, Folate, T3, T4, free, TSH  Shortness of breath - Plan: Comprehensive metabolic panel  Dyspnea on exertion  Type 2 diabetes mellitus with complication, unspecified long term insulin use status (HCC) - Plan: Hemoglobin A1c, Insulin, random, Microalbumin / creatinine urine ratio  Essential hypertension  Vitamin D deficiency  Depression screening  At risk for heart disease - Plan: Lipid Panel With LDL/HDL Ratio  Family history of colon cancer  At risk for colon cancer  Morbid obesity ([place])  PLAN:  Fatigue Quierra was informed that her fatigue may be related to obesity, depression or many other causes. Labs will be ordered, and in the meanwhile Leightyn has agreed to work on diet, exercise and weight loss to help with fatigue. Proper sleep hygiene was discussed including the need for 7-8 hours of quality sleep each night. A sleep study was not ordered based on symptoms and Epworth score.  Exercise Induced Shortness of Breath Larita's shortness of breath appears to be obesity related and exercise induced. She has agreed to work on weight loss and gradually increase exercise to treat her exercise induced shortness of breath. If Jaziel follows our instructions and loses weight without improvement of her shortness of breath, we will  plan to refer to pulmonology. We will monitor this condition regularly. Ivry agrees to this plan.  Diabetes II Yasmene has been given extensive diabetes education by myself today including ideal fasting and post-prandial blood glucose readings, individual ideal HgA1c  goals  and hypoglycemia prevention. We discussed the importance of good blood sugar control to decrease the likelihood of diabetic complications such as nephropathy, neuropathy, limb loss, blindness, coronary artery disease, and death. We discussed the importance of intensive lifestyle modification including diet, exercise and weight loss as the first line treatment for diabetes. Osha agrees to continue her diabetes medications and will follow up at the agreed upon time.  Vitamin D Deficiency Niesa was informed that low vitamin D levels contributes to fatigue and are associated with obesity, breast, and colon cancer. We will check labs and follow.  Depression Screen Izzy had a positive depression screening. Depression is commonly associated with obesity and often results in emotional eating behaviors. We will monitor this closely and work on CBT to help improve the non-hunger eating patterns. Referral to Psychology may be required if no improvement is seen as she continues in our clinic.  Pauletta was given extended (at least 15 minutes) coronary artery disease and colon cancer prevention counseling today. She is 45 y.o. female and has risk factors for heart disease and colon cancer including obesity and strong family history. We discussed intensive lifestyle modifications today with an emphasis on specific weight loss instructions and strategies. Pt was also informed of the importance of increasing exercise and decreasing saturated fats to help prevent heart disease and increasing fiber in diet to help prevent colon cancer.  Obesity Tamar is currently in the action stage of change and her goal is to continue with weight  loss efforts She has agreed to follow the Category 2 plan Beverli has been instructed to work up to a goal of 150 minutes of combined cardio and strengthening exercise per week for weight loss and overall health benefits. We discussed the following Behavioral Modification Stratagies today: increasing lean protein intake, work on meal planning and easy cooking plans, dealing with family or coworker sabotage and holiday eating strategies   Inayah has agreed to follow up with our clinic in 2 weeks. She was informed of the importance of frequent follow up visits to maximize her success with intensive lifestyle modifications for her multiple health conditions. She was informed we would discuss her lab results at her next visit unless there is a critical issue that needs to be addressed sooner. Capri agreed to keep her next visit at the agreed upon time to discuss these results.  I, Doreene Nest, am acting as scribe for Dennard Nip, MD  I have reviewed the above documentation for accuracy and completeness, and I agree with the above. -Dennard Nip, MD

## 2016-01-03 DIAGNOSIS — F419 Anxiety disorder, unspecified: Secondary | ICD-10-CM | POA: Diagnosis not present

## 2016-01-03 DIAGNOSIS — E669 Obesity, unspecified: Secondary | ICD-10-CM | POA: Diagnosis not present

## 2016-01-03 DIAGNOSIS — K580 Irritable bowel syndrome with diarrhea: Secondary | ICD-10-CM | POA: Diagnosis not present

## 2016-01-03 DIAGNOSIS — R933 Abnormal findings on diagnostic imaging of other parts of digestive tract: Secondary | ICD-10-CM | POA: Diagnosis not present

## 2016-01-03 DIAGNOSIS — D490 Neoplasm of unspecified behavior of digestive system: Secondary | ICD-10-CM | POA: Diagnosis not present

## 2016-01-03 DIAGNOSIS — C49A2 Gastrointestinal stromal tumor of stomach: Secondary | ICD-10-CM | POA: Diagnosis not present

## 2016-01-03 DIAGNOSIS — I10 Essential (primary) hypertension: Secondary | ICD-10-CM | POA: Diagnosis not present

## 2016-01-03 DIAGNOSIS — F329 Major depressive disorder, single episode, unspecified: Secondary | ICD-10-CM | POA: Diagnosis not present

## 2016-01-03 DIAGNOSIS — K3189 Other diseases of stomach and duodenum: Secondary | ICD-10-CM | POA: Diagnosis not present

## 2016-01-03 DIAGNOSIS — E785 Hyperlipidemia, unspecified: Secondary | ICD-10-CM | POA: Diagnosis not present

## 2016-01-03 DIAGNOSIS — E559 Vitamin D deficiency, unspecified: Secondary | ICD-10-CM | POA: Diagnosis not present

## 2016-01-03 DIAGNOSIS — E119 Type 2 diabetes mellitus without complications: Secondary | ICD-10-CM | POA: Diagnosis not present

## 2016-01-03 LAB — CBC WITH DIFFERENTIAL
Basophils Absolute: 0 10*3/uL (ref 0.0–0.2)
Basos: 0 %
EOS (ABSOLUTE): 0.1 10*3/uL (ref 0.0–0.4)
EOS: 1 %
HEMATOCRIT: 39 % (ref 34.0–46.6)
HEMOGLOBIN: 13.1 g/dL (ref 11.1–15.9)
IMMATURE GRANULOCYTES: 0 %
Immature Grans (Abs): 0 10*3/uL (ref 0.0–0.1)
Lymphocytes Absolute: 2.6 10*3/uL (ref 0.7–3.1)
Lymphs: 28 %
MCH: 30 pg (ref 26.6–33.0)
MCHC: 33.6 g/dL (ref 31.5–35.7)
MCV: 89 fL (ref 79–97)
MONOCYTES: 5 %
Monocytes Absolute: 0.4 10*3/uL (ref 0.1–0.9)
Neutrophils Absolute: 6.2 10*3/uL (ref 1.4–7.0)
Neutrophils: 66 %
RBC: 4.36 x10E6/uL (ref 3.77–5.28)
RDW: 12.6 % (ref 12.3–15.4)
WBC: 9.4 10*3/uL (ref 3.4–10.8)

## 2016-01-03 LAB — COMPREHENSIVE METABOLIC PANEL
ALBUMIN: 4.4 g/dL (ref 3.5–5.5)
ALT: 18 IU/L (ref 0–32)
AST: 16 IU/L (ref 0–40)
Albumin/Globulin Ratio: 1.5 (ref 1.2–2.2)
Alkaline Phosphatase: 112 IU/L (ref 39–117)
BUN/Creatinine Ratio: 16 (ref 9–23)
BUN: 9 mg/dL (ref 6–24)
Bilirubin Total: 0.4 mg/dL (ref 0.0–1.2)
CALCIUM: 9.3 mg/dL (ref 8.7–10.2)
CO2: 25 mmol/L (ref 18–29)
CREATININE: 0.56 mg/dL — AB (ref 0.57–1.00)
Chloride: 100 mmol/L (ref 96–106)
GFR calc Af Amer: 132 mL/min/{1.73_m2} (ref >59)
GFR, EST NON AFRICAN AMERICAN: 115 mL/min/{1.73_m2} (ref >59)
GLOBULIN, TOTAL: 3 g/dL (ref 1.5–4.5)
Glucose: 116 mg/dL — ABNORMAL HIGH (ref 65–99)
Potassium: 3.7 mmol/L (ref 3.5–5.2)
SODIUM: 142 mmol/L (ref 134–144)
Total Protein: 7.4 g/dL (ref 6.0–8.5)

## 2016-01-03 LAB — MICROALBUMIN / CREATININE URINE RATIO
Creatinine, Urine: 63.6 mg/dL
Microalbumin, Urine: <3 ug/mL

## 2016-01-03 LAB — LIPID PANEL WITH LDL/HDL RATIO
Cholesterol, Total: 188 mg/dL (ref 100–199)
HDL: 52 mg/dL (ref >39)
LDL CALC: 118 mg/dL — AB (ref 0–99)
LDL/HDL RATIO: 2.3 ratio (ref 0.0–3.2)
TRIGLYCERIDES: 89 mg/dL (ref 0–149)
VLDL Cholesterol Cal: 18 mg/dL (ref 5–40)

## 2016-01-03 LAB — VITAMIN D 25 HYDROXY (VIT D DEFICIENCY, FRACTURES)
Vit D, 25-Hydroxy: 26.6 ng/mL — ABNORMAL LOW (ref 30.0–100.0)
Vit D, 25-Hydroxy: 10.2 ng/mL — ABNORMAL LOW (ref 30.0–100.0)

## 2016-01-03 LAB — FOLATE
Folate: 3.4 ng/mL (ref >3.0)
Folate: 11.4 ng/mL (ref >3.0)

## 2016-01-03 LAB — HEMOGLOBIN A1C
Est. average glucose Bld gHb Est-mCnc: 134 mg/dL
Hgb A1c MFr Bld: 6.3 % — ABNORMAL HIGH (ref 4.8–5.6)

## 2016-01-03 LAB — TSH
TSH: 2.94 u[IU]/mL (ref 0.450–4.500)
TSH: 0.019 u[IU]/mL — ABNORMAL LOW (ref 0.450–4.500)

## 2016-01-03 LAB — T4, FREE
FREE T4: 1.32 ng/dL (ref 0.82–1.77)
Free T4: 0.11 ng/dL — ABNORMAL LOW (ref 0.82–1.77)

## 2016-01-03 LAB — INSULIN, RANDOM: INSULIN: 15.3 u[IU]/mL (ref 2.6–24.9)

## 2016-01-03 LAB — T3
T3 TOTAL: 126 ng/dL (ref 71–180)
T3, Total: 202 ng/dL — ABNORMAL HIGH (ref 71–180)

## 2016-01-03 LAB — VITAMIN B12: VITAMIN B 12: 384 pg/mL (ref 232–1245)

## 2016-01-10 ENCOUNTER — Ambulatory Visit (INDEPENDENT_AMBULATORY_CARE_PROVIDER_SITE_OTHER): Payer: 59 | Admitting: Family Medicine

## 2016-01-10 VITALS — BP 125/79 | Ht 65.0 in | Wt 256.0 lb

## 2016-01-10 DIAGNOSIS — E1165 Type 2 diabetes mellitus with hyperglycemia: Secondary | ICD-10-CM | POA: Diagnosis not present

## 2016-01-10 DIAGNOSIS — E559 Vitamin D deficiency, unspecified: Secondary | ICD-10-CM | POA: Diagnosis not present

## 2016-01-10 DIAGNOSIS — E7800 Pure hypercholesterolemia, unspecified: Secondary | ICD-10-CM | POA: Diagnosis not present

## 2016-01-10 MED ORDER — VITAMIN D3 1.25 MG (50000 UT) PO CAPS: 1.0000 | ORAL_CAPSULE | ORAL | 0 refills | Status: DC

## 2016-01-10 MED ORDER — ATORVASTATIN CALCIUM 10 MG PO TABS: 10.0000 mg | ORAL_TABLET | Freq: Every day | ORAL | 0 refills | Status: DC

## 2016-01-10 MED ORDER — LIRAGLUTIDE 18 MG/3ML ~~LOC~~ SOPN: 1.2000 mg | PEN_INJECTOR | SUBCUTANEOUS | 0 refills | Status: DC

## 2016-01-10 MED FILL — VIT D3-50 50,000 UNITS CAPS: 1.25 MG | 28 days supply | Qty: 4 | Fill #0

## 2016-01-10 MED FILL — VICTOZA 2-PAK 18 MG/3 ML PE: 18 | 30 days supply | Qty: 6 | Fill #0

## 2016-01-10 MED FILL — ATORVASTATIN 10 MG TABLET: 10 | 30 days supply | Qty: 30 | Fill #0

## 2016-01-10 NOTE — Progress Notes (Signed)
Office: [phone]  /  Fax: [phone]   HPI:   Chief Complaint: OBESITY Jasmine Martinez is here to discuss her progress with her obesity treatment plan. She is following her eating plan approximately 100 % of the time and states she is exercising 0 minutes 0 times per week. Jasmine Martinez is currently struggling with eating all her food but still liked to snack at night, getting ready to go on vacation and worried about weight gain while having to eat out more.  Her weight is   today and has had a weight loss of 4 pounds over a period of 8 days since her last visit. She has lost 4 lbs since starting treatment with [place].  Hyperlipidemia Jasmine Martinez has hyperlipidemia and has been trying to improve her cholesterol levels with intensive lifestyle modification including a low saturated fat diet, exercise and weight loss. LDL elevated at 118. She denies any chest pain, claudication or myalgias.  Vitamin D deficiency Jasmine Martinez has a diagnosis of vitamin D deficiency. She is currently taking vit D 5,000 IU 2-3 times a day, level not at goal, she notes fatigue but denies muscle weakness.  Diabetes II Jasmine Martinez has a diagnosis of diabetes type II. Jasmine Martinez states BGs range between 130 and 250, 1 hour post prandial and fasting blood sugars between 130-170's reports off Victoza x 3 weeks. Last A1c was 6.3  She has been working on intensive lifestyle modifications including diet, exercise, and weight loss to help control her blood glucose levels.    Wt Readings from Last 500 Encounters:  01/02/16 260 lb 9.8 oz (118.2 kg)  04/26/15 252 lb (114.3 kg)  02/14/15 258 lb 3.2 oz (117.1 kg)  02/14/15 258 lb 3.2 oz (117.1 kg)  11/22/14 265 lb (120.2 kg)  11/15/14 270 lb 8 oz (122.7 kg)  11/01/14 260 lb (117.9 kg)  10/03/14 274 lb 6.4 oz (124.5 kg)  09/26/14 270 lb (122.5 kg)  06/23/14 267 lb 9.6 oz (121.4 kg)  05/06/14 258 lb (117 kg)  12/13/13 245 lb (111.1 kg)  11/15/13 244 lb 9.6 oz (110.9 kg)  09/24/12 270 lb  (122.5 kg)  08/21/12 271 lb (122.9 kg)  08/06/12 268 lb (121.6 kg)  06/13/12 268 lb (121.6 kg)  05/27/12 266 lb (120.7 kg)  02/21/12 259 lb 8 oz (117.7 kg)  11/16/11 259 lb 9.6 oz (117.8 kg)  01/15/11 257 lb 4 oz (116.7 kg)     ALLERGIES: Allergies  Allergen Reactions  . Codeine Shortness Of Breath  . Hydrocodone Shortness Of Breath    Mild SOB    MEDICATIONS: Current Outpatient Prescriptions on File Prior to Visit  Medication Sig Dispense Refill  . ALPRAZolam (XANAX) 0.5 MG tablet Take 1 tablet (0.5 mg total) by mouth at bedtime. 90 tablet 0  . aspirin EC 81 MG tablet Take 81 mg by mouth daily.    . butalbital-acetaminophen-caffeine (FIORICET, ESGIC) 50-325-40 MG per tablet TAKE 1-2 TABLETS BY MOUTH EVERY 6 HOURS AS NEEDED FOR HEADACHE 20 tablet 0  . cyclobenzaprine (FLEXERIL) 10 MG tablet Take 1 tablet (10 mg total) by mouth 3 (three) times daily as needed for muscle spasms. (Patient not taking: Reported on 01/02/2016) 45 tablet 4  . HYDROmorphone (DILAUDID) 4 MG tablet Take 1 tablet (4 mg total) by mouth every 4 (four) hours as needed for severe pain. (Patient not taking: Reported on 01/02/2016) 30 tablet 0  . levonorgestrel (MIRENA) 20 MCG/24HR IUD 1 each by Intrauterine route once.    . lidocaine (LIDODERM) 5 %  Place 1 patch onto the skin daily. Remove & Discard patch within 12 hours or as directed by MD (Patient not taking: Reported on 01/02/2016) 30 patch 0  . Multiple Vitamin (MULTIVITAMIN WITH MINERALS) TABS tablet Take 1 tablet by mouth daily.    Marland Kitchen NOVOFINE 32G X 6 MM MISC Inject 1 Stick as directed daily. 90 each 5  . TRUE METRIX BLOOD GLUCOSE TEST test strip Apply 1 strip topically daily.  3  . TRUEPLUS LANCETS 30G MISC Inject 1 Stick as directed daily. 100 each 6  . VICTOZA 18 MG/3ML SOPN Inject 0.3 mLs (1.8 mg total) into the skin daily. 6 mL 5  . Vitamin D, Ergocalciferol, (DRISDOL) 50000 UNITS CAPS capsule Take 50,000 Units by mouth every 7 (seven) days.     No  current facility-administered medications on file prior to visit.     PAST MEDICAL HISTORY: Past Medical History:  Diagnosis Date  . Anxiety   . Back pain   . Chest pain   . Depression   . Diabetes mellitus   . Diarrhea   . Edema    feet and legs  . Gallbladder problem   . 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 GERD (gastroesophageal reflux disease)   . Heartburn   . HTN (hypertension)   . Hyperlipidemia   . Obesity   . Palpitations   . PMDD (premenstrual dysphoric disorder) 01/02/2016  . Shortness of breath   . Vitamin D deficiency   . Vitamin D deficiency     PAST SURGICAL HISTORY: Past Surgical History:  Procedure Laterality Date  . APPENDECTOMY    . CHOLECYSTECTOMY    . EUS N/A 03/23/2014   Procedure: ESOPHAGEAL ENDOSCOPIC ULTRASOUND (EUS) RADIAL;  Surgeon: Arta Silence, MD;  Location: [facility];  Service: Endoscopy;  Laterality: N/A;  . FINE NEEDLE ASPIRATION N/A 03/23/2014   Procedure: FINE NEEDLE ASPIRATION (FNA) LINEAR;  Surgeon: Arta Silence, MD;  Location: [facility];  Service: Endoscopy;  Laterality: N/A;  . TONSILLECTOMY      SOCIAL HISTORY: Social History  Substance Use Topics  . Smoking status: Never Smoker  . Smokeless tobacco: Never Used  . Alcohol use No    FAMILY HISTORY: Family History  Problem Relation Age of Onset  . Healthy Mother   . Hypertension Mother   . Hyperlipidemia Mother   . Alcoholism Mother   . Anxiety disorder Mother   . Drug abuse Mother   . Healthy Father   . Colon cancer Paternal Grandfather 13    colon    ROS: Review of Systems  Constitutional: Positive for malaise/fatigue and weight loss.  Gastrointestinal: Positive for nausea.    PHYSICAL EXAM: There were no vitals taken for this visit. There is no height or weight on file to calculate BMI. Physical Exam  Constitutional: She is oriented to person, place, and time. She appears well-developed and well-nourished.  Neurological:  She is oriented to person, place, and time.    RECENT LABS AND TESTS: BMET    Component Value Date/Time   NA 142 01/02/2016 1503   K 3.7 01/02/2016 1503   CL 100 01/02/2016 1503   CO2 25 01/02/2016 1503   GLUCOSE 116 (H) 01/02/2016 1503   GLUCOSE 99 09/26/2014 2012   BUN 9 01/02/2016 1503   CREATININE 0.56 (L) 01/02/2016 1503   CREATININE 0.65 05/05/2014 0902   CALCIUM 9.3 01/02/2016 1503   GFRNONAA 115 01/02/2016 1503   GFRNONAA >89 05/05/2014 0902  GFRAA 132 01/02/2016 1503   GFRAA >89 05/05/2014 0902   Lab Results  Component Value Date   HGBA1C 6.3 (H) 01/02/2016   HGBA1C WILL FOLLOW 01/02/2016   HGBA1C 6.3 (H) 08/24/2015   HGBA1C 6 02/14/2015   HGBA1C 6.1 (H) 05/05/2014   Lab Results  Component Value Date   INSULIN 15.3 01/02/2016   INSULIN WILL FOLLOW 01/02/2016   CBC    Component Value Date/Time   WBC 9.4 01/02/2016 1503   WBC 10.7 (H) 09/26/2014 2012   RBC 4.36 01/02/2016 1503   RBC 4.53 09/26/2014 2012   HGB 13.6 09/26/2014 2012   HCT 39.0 01/02/2016 1503   PLT 323 09/26/2014 2012   MCV 89 01/02/2016 1503   MCH 30.0 01/02/2016 1503   MCH 30.0 09/26/2014 2012   MCHC 33.6 01/02/2016 1503   MCHC 34.0 09/26/2014 2012   RDW 12.6 01/02/2016 1503   LYMPHSABS 2.6 01/02/2016 1503   MONOABS 0.6 05/05/2014 0902   EOSABS 0.1 01/02/2016 1503   BASOSABS 0.0 01/02/2016 1503   Iron/TIBC/Ferritin/ %Sat No results found for: IRON, TIBC, FERRITIN, IRONPCTSAT Lipid Panel     Component Value Date/Time   CHOL 188 01/02/2016 1503   TRIG 89 01/02/2016 1503   HDL 52 01/02/2016 1503   CHOLHDL 3.4 05/05/2014 0902   VLDL 16 05/05/2014 0902   LDLCALC 118 (H) 01/02/2016 1503   Hepatic Function Panel     Component Value Date/Time   PROT 7.4 01/02/2016 1503   ALBUMIN 4.4 01/02/2016 1503   AST 16 01/02/2016 1503   ALT 18 01/02/2016 1503   ALKPHOS 112 01/02/2016 1503   BILITOT 0.4 01/02/2016 1503      Component Value Date/Time   TSH 2.940 01/02/2016 1503   TSH  0.019 (L) 01/02/2016 1449   TSH 2.053 05/05/2014 0902    ASSESSMENT AND PLAN: Obesity, morbid ([place])  PLAN: Hyperlipidemia Jasmine Martinez has new diagnosis of hyperlipidemia. Jasmine Martinez was informed of the American Heart Association Guidelines emphasize intensive lifestyle modifications as the first line treatment for hyperlipidemia. We discussed many lifestyle modifications today in depth, and Jasmine Martinez will continue to work on decreasing saturated fats such as fatty red meat, butter and many fried foods. She will also increase vegetables and lean protein in her diet and continue to work on exercise and her weight loss efforts. Jasmine Martinez will start Lipitor 10 MG every night, #30, no refills.  Vitamin D Deficiency Jasmine Martinez was informed that low vitamin D levels contributes to fatigue and are associated with obesity, breast, and colon cancer. She agrees to discontinue OTC vitamin D and take prescription Vit D @50 ,000 IU every week #4, no refills and will follow up for routine testing of vitamin D in 3 months. She was informed of the risk of over-replacement of vitamin D and agrees to not increase her dose unless Jasmine discusses this with [place] first.  Diabetes II Jasmine Martinez has been given extensive diabetes education by myself today including ideal fasting and post-prandial blood glucose readings, individual ideal HgA1c goals  and hypoglycemia prevention. We discussed the importance of good blood sugar control to decrease the likelihood of diabetic complications such as nephropathy, neuropathy, limb loss, blindness, coronary artery disease, and death. We discussed the importance of intensive lifestyle modification including diet, exercise and weight loss as the first line treatment for diabetes. Jasmine Martinez agrees to restart Victoza 1.2 MG subcutaneous Every morning, #2 pen box, no refills and will follow up at the agreed upon time.  Obesity Jasmine Martinez is currently in  the action stage of change. As such, her goal is to  continue with weight loss efforts She has agreed to keep a food journal with 1300-1500 calories and 80 grams protein daily Katesha has been instructed to work up to a goal of 150 minutes of combined cardio and strengthening exercise per week for weight loss and overall health benefits. We discussed the following Behavioral Modification Stratagies today: increasing lean protein intake, work on snack planning, travel eating strategies.   Katryn has agreed to follow up with our clinic in 2 weeks. She was informed of the importance of frequent follow up visits to maximize her success with intensive lifestyle modifications for her multiple health conditions.  Jasmine Martinez, Jasmine Martinez, am acting as scribe for Jasmine Nip, MD  Jasmine Martinez have reviewed the above documentation for accuracy and completeness, and Jasmine Martinez agree with the above. -Jasmine Nip, MD

## 2016-01-22 ENCOUNTER — Ambulatory Visit (INDEPENDENT_AMBULATORY_CARE_PROVIDER_SITE_OTHER): Payer: 59 | Admitting: Family Medicine

## 2016-01-23 DIAGNOSIS — F411 Generalized anxiety disorder: Secondary | ICD-10-CM | POA: Diagnosis not present

## 2016-01-23 DIAGNOSIS — F330 Major depressive disorder, recurrent, mild: Secondary | ICD-10-CM | POA: Diagnosis not present

## 2016-01-24 ENCOUNTER — Ambulatory Visit (INDEPENDENT_AMBULATORY_CARE_PROVIDER_SITE_OTHER): Payer: 59 | Admitting: Family Medicine

## 2016-01-24 ENCOUNTER — Encounter (INDEPENDENT_AMBULATORY_CARE_PROVIDER_SITE_OTHER): Payer: Self-pay | Admitting: Family Medicine

## 2016-01-24 VITALS — BP 126/74 | HR 70 | Temp 98.5°F | Ht 65.0 in | Wt 252.0 lb

## 2016-01-24 DIAGNOSIS — F329 Major depressive disorder, single episode, unspecified: Secondary | ICD-10-CM

## 2016-01-24 DIAGNOSIS — Z794 Long term (current) use of insulin: Secondary | ICD-10-CM

## 2016-01-24 DIAGNOSIS — F32A Depression, unspecified: Secondary | ICD-10-CM

## 2016-01-24 DIAGNOSIS — E119 Type 2 diabetes mellitus without complications: Secondary | ICD-10-CM

## 2016-01-24 MED ORDER — BUPROPION HCL ER (SR) 150 MG PO TB12: 150.0000 mg | ORAL_TABLET | Freq: Every day | ORAL | 0 refills | Status: DC

## 2016-01-24 MED FILL — BUPROPION SR 150 MG TABLET: 150 | 30 days supply | Qty: 30 | Fill #0

## 2016-01-24 NOTE — Progress Notes (Signed)
Office: [phone]  /  Fax: [phone]   HPI:   Chief Complaint: OBESITY Jasmine Martinez is here to discuss her progress with her obesity treatment plan. She is following her eating plan approximately 50 % of the time and states she is exercising 0 minutes 0 times per week. Her weight is 252 lb (114.3 kg) today and has had a weight loss of 4 pounds over a period of 2 weeks since her last visit. She has lost 4 lbs since starting treatment with [place]. Patient continues to do well with weight loss even on vacation. She was also able to journal her food.   Depression with emotional eating behaviors Jasmine Martinez is struggling with emotional eating and using food for comfort to the extent that it is negatively impacting her health. She often snacks when she is not hungry. Jasmine Martinez sometimes feels she is out of control and then feels guilty that she made poor food choices. She has been working on behavior modification techniques to help reduce her emotional eating and has been somewhat successful. She shows no sign of suicidal or homicidal ideations. Patient notes an increase in emotional eating in the last two weeks with a increase in work stress.   Diabetes II Jasmine Martinez has a diagnosis of diabetes type II. Jasmine Martinez states patient does not check sugars and denies any hypoglycemic episodes. Last A1c was Hemoglobin A1C Latest Ref Rng & Units 01/02/2016 01/02/2016 08/24/2015 02/14/2015  HGBA1C 4.8 - 5.6 % 6.3(H) WILL FOLLOW 6.3(H) 6  Some recent data might be hidden    She has been working on intensive lifestyle modifications including diet, exercise, and weight loss to help control her blood glucose levels. She lost her glucometer and restarted Jasmine Martinez at 0.9 (1/2 inbetween 0.6 and 1.2). She did not have GI upset.    Depression screen [facility] 2/9 01/02/2016 02/14/2015 06/23/2014  Decreased Interest 1 0 1  Down, Depressed, Hopeless 1 0 1  PHQ - 2 Score 2 0 2  Altered sleeping 2 - 1  Tired, decreased energy 3 - 1    Change in appetite 3 - 0  Feeling bad or failure about yourself  3 - 0  Trouble concentrating 0 - 0  Moving slowly or fidgety/restless 0 - 0  Suicidal thoughts 0 - 0  PHQ-9 Score 13 - 4  Difficult doing work/chores - - Not difficult at all      Wt Readings from Last 500 Encounters:  01/24/16 252 lb (114.3 kg)  01/10/16 256 lb (116.1 kg)  01/02/16 260 lb 9.8 oz (118.2 kg)  04/26/15 252 lb (114.3 kg)  02/14/15 258 lb 3.2 oz (117.1 kg)  02/14/15 258 lb 3.2 oz (117.1 kg)  11/22/14 265 lb (120.2 kg)  11/15/14 270 lb 8 oz (122.7 kg)  11/01/14 260 lb (117.9 kg)  10/03/14 274 lb 6.4 oz (124.5 kg)  09/26/14 270 lb (122.5 kg)  06/23/14 267 lb 9.6 oz (121.4 kg)  05/06/14 258 lb (117 kg)  12/13/13 245 lb (111.1 kg)  11/15/13 244 lb 9.6 oz (110.9 kg)  09/24/12 270 lb (122.5 kg)  08/21/12 271 lb (122.9 kg)  08/06/12 268 lb (121.6 kg)  06/13/12 268 lb (121.6 kg)  05/27/12 266 lb (120.7 kg)  02/21/12 259 lb 8 oz (117.7 kg)  11/16/11 259 lb 9.6 oz (117.8 kg)  01/15/11 257 lb 4 oz (116.7 kg)     ALLERGIES: Allergies  Allergen Reactions  . Codeine Shortness Of Breath  . Hydrocodone Shortness Of Breath  Mild SOB    MEDICATIONS: Current Outpatient Prescriptions on File Prior to Visit  Medication Sig Dispense Refill  . ALPRAZolam (XANAX) 0.5 MG tablet Take 1 tablet (0.5 mg total) by mouth at bedtime. 90 tablet 0  . aspirin EC 81 MG tablet Take 81 mg by mouth daily.    Marland Kitchen atorvastatin (LIPITOR) 10 MG tablet Take 1 tablet (10 mg total) by mouth daily. 30 tablet 0  . butalbital-acetaminophen-caffeine (FIORICET, ESGIC) 50-325-40 MG per tablet TAKE 1-2 TABLETS BY MOUTH EVERY 6 HOURS AS NEEDED FOR HEADACHE (Patient not taking: Reported on 01/10/2016) 20 tablet 0  . Cholecalciferol (VITAMIN D3) 50000 units CAPS Take 1 Dose by mouth once a week. 4 capsule 0  . cyclobenzaprine (FLEXERIL) 10 MG tablet Take 1 tablet (10 mg total) by mouth 3 (three) times daily as needed for muscle spasms.  (Patient not taking: Reported on 01/10/2016) 45 tablet 4  . HYDROmorphone (DILAUDID) 4 MG tablet Take 1 tablet (4 mg total) by mouth every 4 (four) hours as needed for severe pain. (Patient not taking: Reported on 01/10/2016) 30 tablet 0  . levonorgestrel (MIRENA) 20 MCG/24HR IUD 1 each by Intrauterine route once.    . lidocaine (LIDODERM) 5 % Place 1 patch onto the skin daily. Remove & Discard patch within 12 hours or as directed by MD (Patient not taking: Reported on 01/10/2016) 30 patch 0  . liraglutide (Jasmine Martinez) 18 MG/3ML SOPN Inject 0.2 mLs (1.2 mg total) into the skin every morning. 2 pen 0  . Multiple Vitamin (MULTIVITAMIN WITH MINERALS) TABS tablet Take 1 tablet by mouth daily.    Marland Kitchen NOVOFINE 32G X 6 MM MISC Inject 1 Stick as directed daily. 90 each 5  . TRUE METRIX BLOOD GLUCOSE TEST test strip Apply 1 strip topically daily.  3  . TRUEPLUS LANCETS 30G MISC Inject 1 Stick as directed daily. 100 each 6   No current facility-administered medications on file prior to visit.     PAST MEDICAL HISTORY: Past Medical History:  Diagnosis Date  . Anxiety   . Back pain   . Chest pain   . Depression   . Diabetes mellitus   . Diarrhea   . Edema    feet and legs  . Gallbladder problem   . 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 GERD (gastroesophageal reflux disease)   . Heartburn   . HTN (hypertension)   . Hyperlipidemia   . Obesity   . Palpitations   . PMDD (premenstrual dysphoric disorder) 01/02/2016  . Shortness of breath   . Vitamin D deficiency   . Vitamin D deficiency     PAST SURGICAL HISTORY: Past Surgical History:  Procedure Laterality Date  . APPENDECTOMY    . CHOLECYSTECTOMY    . EUS N/A 03/23/2014   Procedure: ESOPHAGEAL ENDOSCOPIC ULTRASOUND (EUS) RADIAL;  Surgeon: Arta Silence, MD;  Location: [facility];  Service: Endoscopy;  Laterality: N/A;  . FINE NEEDLE ASPIRATION N/A 03/23/2014   Procedure: FINE NEEDLE ASPIRATION (FNA) LINEAR;   Surgeon: Arta Silence, MD;  Location: [facility];  Service: Endoscopy;  Laterality: N/A;  . TONSILLECTOMY      SOCIAL HISTORY: Social History  Substance Use Topics  . Smoking status: Never Smoker  . Smokeless tobacco: Never Used  . Alcohol use No    FAMILY HISTORY: Family History  Problem Relation Age of Onset  . Healthy Mother   . Hypertension Mother   . Hyperlipidemia Mother   . Alcoholism  Mother   . Anxiety disorder Mother   . Drug abuse Mother   . Healthy Father   . Colon cancer Paternal Grandfather 53    colon    ROS: Review of Systems  Constitutional: Positive for weight loss.  Psychiatric/Behavioral: Positive for depression. Negative for suicidal ideas.  All other systems reviewed and are negative.   PHYSICAL EXAM: Blood pressure 126/74, pulse 70, temperature 98.5 F (36.9 C), temperature source Oral, height 5\' 5"  (1.651 m), weight 252 lb (114.3 kg), SpO2 99 %. Body mass index is 41.93 kg/m. Physical Exam  Constitutional: She is oriented to person, place, and time. She appears well-developed and well-nourished.  Cardiovascular: Normal rate.   Pulmonary/Chest: Effort normal.  Musculoskeletal: Normal range of motion.  Neurological: She is alert and oriented to person, place, and time.  Skin: Skin is warm and dry.  Psychiatric: She has a normal mood and affect.  Vitals reviewed.   RECENT LABS AND TESTS: BMET    Component Value Date/Time   NA 142 01/02/2016 1503   K 3.7 01/02/2016 1503   CL 100 01/02/2016 1503   CO2 25 01/02/2016 1503   GLUCOSE 116 (H) 01/02/2016 1503   GLUCOSE 99 09/26/2014 2012   BUN 9 01/02/2016 1503   CREATININE 0.56 (L) 01/02/2016 1503   CREATININE 0.65 05/05/2014 0902   CALCIUM 9.3 01/02/2016 1503   GFRNONAA 115 01/02/2016 1503   GFRNONAA >89 05/05/2014 0902   GFRAA 132 01/02/2016 1503   GFRAA >89 05/05/2014 0902   Lab Results  Component Value Date   HGBA1C 6.3 (H) 01/02/2016   HGBA1C WILL FOLLOW 01/02/2016   HGBA1C  6.3 (H) 08/24/2015   HGBA1C 6 02/14/2015   HGBA1C 6.1 (H) 05/05/2014   Lab Results  Component Value Date   INSULIN 15.3 01/02/2016   INSULIN WILL FOLLOW 01/02/2016   CBC    Component Value Date/Time   WBC 9.4 01/02/2016 1503   WBC 10.7 (H) 09/26/2014 2012   RBC 4.36 01/02/2016 1503   RBC 4.53 09/26/2014 2012   HGB 13.6 09/26/2014 2012   HCT 39.0 01/02/2016 1503   PLT 323 09/26/2014 2012   MCV 89 01/02/2016 1503   MCH 30.0 01/02/2016 1503   MCH 30.0 09/26/2014 2012   MCHC 33.6 01/02/2016 1503   MCHC 34.0 09/26/2014 2012   RDW 12.6 01/02/2016 1503   LYMPHSABS 2.6 01/02/2016 1503   MONOABS 0.6 05/05/2014 0902   EOSABS 0.1 01/02/2016 1503   BASOSABS 0.0 01/02/2016 1503   Iron/TIBC/Ferritin/ %Sat No results found for: IRON, TIBC, FERRITIN, IRONPCTSAT Lipid Panel     Component Value Date/Time   CHOL 188 01/02/2016 1503   TRIG 89 01/02/2016 1503   HDL 52 01/02/2016 1503   CHOLHDL 3.4 05/05/2014 0902   VLDL 16 05/05/2014 0902   LDLCALC 118 (H) 01/02/2016 1503   Hepatic Function Panel     Component Value Date/Time   PROT 7.4 01/02/2016 1503   ALBUMIN 4.4 01/02/2016 1503   AST 16 01/02/2016 1503   ALT 18 01/02/2016 1503   ALKPHOS 112 01/02/2016 1503   BILITOT 0.4 01/02/2016 1503      Component Value Date/Time   TSH 2.940 01/02/2016 1503   TSH 0.019 (L) 01/02/2016 1449   TSH 2.053 05/05/2014 0902    ASSESSMENT AND PLAN: Depression, unspecified depression type - Plan: buPROPion (WELLBUTRIN SR) 150 MG 12 hr tablet  Type 2 diabetes mellitus without complication, with long-term current use of insulin ([facility])  Morbid obesity ([place])  PLAN:  Depression with Emotional Eating Behaviors We discussed behavior modification techniques today to help Jasmine Martinez deal with her emotional eating and depression. She has agreed to take Wellbutrin SR 150 mg qd and agreed to follow up as directed. A new prescription was sent over quanity of 30 with 0 refills to her pharmacy. Will start  the patient on Wellbutrin SR 150 mg every morning #30 with 0 refills.    Diabetes II Jasmine Martinez has been given extensive diabetes education by myself today including ideal fasting and post-prandial blood glucose readings, individual ideal HgA1c goals  and hypoglycemia prevention. We discussed the importance of good blood sugar control to decrease the likelihood of diabetic complications such as nephropathy, neuropathy, limb loss, blindness, coronary artery disease, and death. We discussed the importance of intensive lifestyle modification including diet, exercise and weight loss as the first line treatment for diabetes. Jasmine Martinez agrees to continue her diabetes medications and will follow up at the agreed upon time. Will have the patient increase the Jasmine Martinez to 1.2 mg every morning.   Obesity Jasmine Martinez is currently in the action stage of change. As such, her goal is to continue with weight loss efforts She has agreed to keep a food journal with 1300-1500 calories and 80 protein daily and follow the Category 2 plan Jasmine Martinez has been instructed to work up to a goal of 150 minutes of combined cardio and strengthening exercise per week for weight loss and overall health benefits. We discussed the following Behavioral Modification Stratagies today: increasing lean protein intake and decreasing simple carbohydrates    Jasmine Martinez has agreed to follow up with our clinic in 2 weeks. She was informed of the importance of frequent follow up visits to maximize her success with intensive lifestyle modifications for her multiple health conditions.  I, April Moore, am acting as Education administrator for Dennard Nip, MD  I have reviewed the above documentation for accuracy and completeness, and I agree with the above. -Dennard Nip, MD

## 2016-01-29 ENCOUNTER — Ambulatory Visit
Admission: RE | Admit: 2016-01-29 | Discharge: 2016-01-29 | Disposition: A | Discharge status: Home / Self Care | Payer: 59 | Source: Ambulatory Visit | Attending: Internal Medicine | Admitting: Internal Medicine

## 2016-01-29 ENCOUNTER — Encounter: Payer: Self-pay | Admitting: Internal Medicine

## 2016-01-29 ENCOUNTER — Ambulatory Visit (INDEPENDENT_AMBULATORY_CARE_PROVIDER_SITE_OTHER): Payer: 59 | Admitting: Internal Medicine

## 2016-01-29 VITALS — BP 130/80 | HR 84 | Wt 243.0 lb

## 2016-01-29 DIAGNOSIS — L03115 Cellulitis of right lower limb: Secondary | ICD-10-CM | POA: Diagnosis not present

## 2016-01-29 DIAGNOSIS — L03818 Cellulitis of other sites: Secondary | ICD-10-CM | POA: Diagnosis not present

## 2016-01-29 DIAGNOSIS — S99921A Unspecified injury of right foot, initial encounter: Secondary | ICD-10-CM | POA: Diagnosis not present

## 2016-01-29 MED ORDER — LEVOFLOXACIN 500 MG PO TABS
500.0000 mg | ORAL_TABLET | Freq: Every day | ORAL | 0 refills | Status: DC
Start: 2016-01-29 — End: 2016-03-11

## 2016-01-29 MED ORDER — MUPIROCIN 2 % EX OINT: 1.0000 "application " | TOPICAL_OINTMENT | Freq: Two times a day (BID) | CUTANEOUS | 0 refills | Status: DC

## 2016-01-29 MED FILL — levoFLOXacin 500 MG TABS: 500 | 10 days supply | Qty: 10 | Fill #0

## 2016-01-29 MED FILL — MUPIROCIN 2% OINTMENT: 2 | 15 days supply | Qty: 22 | Fill #0

## 2016-01-29 NOTE — Patient Instructions (Signed)
Levaquin 500 milligrams daily for 10 days. Apply Bactroban to right foot after soaking it in treating it with peroxide. Keep elevated as much as possible. Have x-ray.

## 2016-01-29 NOTE — Progress Notes (Signed)
   Subjective:    Patient ID: Jasmine Martinez, female    DOB: 13-Feb-1970, 46 y.o.   MRN: [id number]  HPI 46 year old Female who is in [facility] on January 6. She was wearing a flip-flop and struck the dorsal aspect of her foot on cement. She is a diabetic. There was an abraded area along the first metatarsal dorsal aspect. It appears to become infected with puffiness increased erythema and some drainage. Seems to gotten worse this past weekend. She says at first it looked like a scratch.    Review of Systems see above. No fever or shaking chills.     Objective:   Physical Exam  3 mm draining lesion along distal first metatarsal with surrounding erythema and a bit of violaceous discoloration. Since for x-ray to rule out osteomyelitis/occult fracture     Assessment & Plan:  Cellulitis right foot  Plan: Levaquin 500 milligrams daily for 10 days. Soak foot in warm soapy water 20 minutes twice daily. Dry carefully, apply peroxide, dry carefully and apply Bactroban ointment. Keep foot elevated as much as possible.

## 2016-01-30 DIAGNOSIS — F330 Major depressive disorder, recurrent, mild: Secondary | ICD-10-CM | POA: Diagnosis not present

## 2016-01-30 DIAGNOSIS — F411 Generalized anxiety disorder: Secondary | ICD-10-CM | POA: Diagnosis not present

## 2016-02-05 ENCOUNTER — Ambulatory Visit (INDEPENDENT_AMBULATORY_CARE_PROVIDER_SITE_OTHER): Payer: 59 | Admitting: Family Medicine

## 2016-02-05 VITALS — BP 114/78 | HR 77 | Temp 98.5°F | Ht 65.0 in | Wt 253.0 lb

## 2016-02-05 DIAGNOSIS — F3289 Other specified depressive episodes: Secondary | ICD-10-CM | POA: Diagnosis not present

## 2016-02-05 DIAGNOSIS — F329 Major depressive disorder, single episode, unspecified: Secondary | ICD-10-CM | POA: Insufficient documentation

## 2016-02-05 DIAGNOSIS — IMO0001 Reserved for inherently not codable concepts without codable children: Secondary | ICD-10-CM | POA: Insufficient documentation

## 2016-02-05 DIAGNOSIS — E559 Vitamin D deficiency, unspecified: Secondary | ICD-10-CM

## 2016-02-05 DIAGNOSIS — F32A Depression, unspecified: Secondary | ICD-10-CM | POA: Insufficient documentation

## 2016-02-05 DIAGNOSIS — Z9189 Other specified personal risk factors, not elsewhere classified: Secondary | ICD-10-CM

## 2016-02-05 DIAGNOSIS — E119 Type 2 diabetes mellitus without complications: Secondary | ICD-10-CM | POA: Diagnosis not present

## 2016-02-05 NOTE — Progress Notes (Signed)
Office: [phone]  /  Fax: [phone]   HPI:   Chief Complaint: OBESITY Jasmine Martinez is here to discuss her progress with her obesity treatment plan. She is following her eating plan approximately 20 % of the time and states she is exercising 0 minutes 0 times per week. Jasmine Martinez is currently struggling with emotional eating over the past 2 weeks and Jasmine meal planning and increased eating out. Jasmine Martinez is ready to get back on track. Her weight is 253 lb (114.8 kg) today and has had a weight gain of 1 lb since her last visit. She has lost 7 lbs since starting treatment with [place].  Vitamin D deficiency Jasmine Martinez has a diagnosis of vitamin D deficiency. She is currently taking vit D and denies nausea, vomiting or muscle weakness.  Depression with emotional eating behaviors Jasmine Martinez is still struggling with emotional eating over the past 2 weeks and is using food for comfort to the extent that it is negatively impacting her health. She often snacks when she is not hungry. Jasmine Martinez sometimes feels she is out of control and then feels guilty that she made poor food choices. She reports that she is not sleeping well, wakes frequently. She started Wellbutrin last week.  She has been working on behavior modification techniques to help reduce her emotional eating and has been unsuccessful. She shows no sign of suicidal or homicidal ideations.  Due to her emotional eating she is showing signs of low self-esteem and is at risk of developing a self-esteem disorder which can effect her long term happiness and health success. 15 minutes were spent on counseling to work on dealing with this potential problem.  Diabetes II Jasmine Martinez has a diagnosis of diabetes type II. Jasmine Martinez states fasting blood sugars are improved at between 108-120 with increased Victoza to  1.2 MG and denies any hypoglycemic episodes, nausea or vomiting. Last A1c was at 6.3 She has been working on intensive lifestyle modifications  including diet, exercise, and weight loss to help control her blood glucose levels, though she's not following the eating plan as close.   Depression screen Jasmine Martinez 2/9 01/02/2016 02/14/2015 06/23/2014  Jasmine Martinez 1 0 1  Down, Depressed, Hopeless 1 0 1  PHQ - 2 Score 2 0 2  Altered sleeping 2 - 1  Tired, Jasmine energy 3 - 1  Change in appetite 3 - 0  Feeling bad or failure about yourself  3 - 0  Trouble concentrating 0 - 0  Moving slowly or fidgety/restless 0 - 0  Suicidal thoughts 0 - 0  PHQ-9 Score 13 - 4  Difficult doing work/chores - - Not difficult at all      Wt Readings from Last 500 Encounters:  02/05/16 253 lb (114.8 kg)  01/29/16 243 lb (110.2 kg)  01/24/16 252 lb (114.3 kg)  01/10/16 256 lb (116.1 kg)  01/02/16 260 lb 9.8 oz (118.2 kg)  04/26/15 252 lb (114.3 kg)  02/14/15 258 lb 3.2 oz (117.1 kg)  02/14/15 258 lb 3.2 oz (117.1 kg)  11/22/14 265 lb (120.2 kg)  11/15/14 270 lb 8 oz (122.7 kg)  11/01/14 260 lb (117.9 kg)  10/03/14 274 lb 6.4 oz (124.5 kg)  09/26/14 270 lb (122.5 kg)  06/23/14 267 lb 9.6 oz (121.4 kg)  05/06/14 258 lb (117 kg)  12/13/13 245 lb (111.1 kg)  11/15/13 244 lb 9.6 oz (110.9 kg)  09/24/12 270 lb (122.5 kg)  08/21/12 271 lb (122.9 kg)  08/06/12 268 lb (121.6 kg)  06/13/12  268 lb (121.6 kg)  05/27/12 266 lb (120.7 kg)  02/21/12 259 lb 8 oz (117.7 kg)  11/16/11 259 lb 9.6 oz (117.8 kg)  01/15/11 257 lb 4 oz (116.7 kg)     ALLERGIES: Allergies  Allergen Reactions  . Codeine Shortness Of Breath  . Hydrocodone Shortness Of Breath    Mild SOB    MEDICATIONS: Current Outpatient Prescriptions on File Prior to Visit  Medication Sig Dispense Refill  . ALPRAZolam (XANAX) 0.5 MG tablet Take 1 tablet (0.5 mg total) by mouth at bedtime. 90 tablet 0  . aspirin EC 81 MG tablet Take 81 mg by mouth daily.    Marland Kitchen atorvastatin (LIPITOR) 10 MG tablet Take 1 tablet (10 mg total) by mouth daily. 30 tablet 0  . buPROPion (WELLBUTRIN SR) 150  MG 12 hr tablet Take 1 tablet (150 mg total) by mouth daily. 30 tablet 0  . butalbital-acetaminophen-caffeine (FIORICET, ESGIC) 50-325-40 MG per tablet TAKE 1-2 TABLETS BY MOUTH EVERY 6 HOURS AS NEEDED FOR HEADACHE 20 tablet 0  . Cholecalciferol (VITAMIN D3) 50000 units CAPS Take 1 Dose by mouth once a week. 4 capsule 0  . cyclobenzaprine (FLEXERIL) 10 MG tablet Take 1 tablet (10 mg total) by mouth 3 (three) times daily as needed for muscle spasms. 45 tablet 4  . HYDROmorphone (DILAUDID) 4 MG tablet Take 1 tablet (4 mg total) by mouth every 4 (four) hours as needed for severe pain. 30 tablet 0  . levofloxacin (LEVAQUIN) 500 MG tablet Take 1 tablet (500 mg total) by mouth daily. 10 tablet 0  . levonorgestrel (MIRENA) 20 MCG/24HR IUD 1 each by Intrauterine route once.    . lidocaine (LIDODERM) 5 % Place 1 patch onto the skin daily. Remove & Discard patch within 12 hours or as directed by MD 30 patch 0  . liraglutide (VICTOZA) 18 MG/3ML SOPN Inject 0.2 mLs (1.2 mg total) into the skin every morning. 2 pen 0  . Multiple Vitamin (MULTIVITAMIN WITH MINERALS) TABS tablet Take 1 tablet by mouth daily.    . mupirocin ointment (BACTROBAN) 2 % Place 1 application into the nose 2 (two) times daily. Use on foot twice daily 22 g 0  . NOVOFINE 32G X 6 MM MISC Inject 1 Stick as directed daily. 90 each 5  . TRUE METRIX BLOOD GLUCOSE TEST test strip Apply 1 strip topically daily.  3  . TRUEPLUS LANCETS 30G MISC Inject 1 Stick as directed daily. 100 each 6   No current facility-administered medications on file prior to visit.     PAST MEDICAL HISTORY: Past Medical History:  Diagnosis Date  . Anxiety   . Back pain   . Chest pain   . Depression   . Diabetes mellitus   . Diarrhea   . Edema    feet and legs  . Gallbladder problem   . 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 GERD (gastroesophageal reflux disease)   . Heartburn   . HTN (hypertension)   . Hyperlipidemia   . Obesity     . Palpitations   . PMDD (premenstrual dysphoric disorder) 01/02/2016  . Shortness of breath   . Vitamin D deficiency   . Vitamin D deficiency     PAST SURGICAL HISTORY: Past Surgical History:  Procedure Laterality Date  . APPENDECTOMY    . CHOLECYSTECTOMY    . EUS N/A 03/23/2014   Procedure: ESOPHAGEAL ENDOSCOPIC ULTRASOUND (EUS) RADIAL;  Surgeon: Arta Silence, MD;  Location: [facility];  Service: Endoscopy;  Laterality: N/A;  . FINE NEEDLE ASPIRATION N/A 03/23/2014   Procedure: FINE NEEDLE ASPIRATION (FNA) LINEAR;  Surgeon: Arta Silence, MD;  Location: [facility];  Service: Endoscopy;  Laterality: N/A;  . TONSILLECTOMY      SOCIAL HISTORY: Social History  Substance Use Topics  . Smoking status: Never Smoker  . Smokeless tobacco: Never Used  . Alcohol use No    FAMILY HISTORY: Family History  Problem Relation Age of Onset  . Healthy Mother   . Hypertension Mother   . Hyperlipidemia Mother   . Alcoholism Mother   . Anxiety disorder Mother   . Drug abuse Mother   . Healthy Father   . Colon cancer Paternal Grandfather 58    colon    ROS: Review of Systems  Gastrointestinal: Negative for nausea and vomiting.  Musculoskeletal:       Negative muscle weakness  Endo/Heme/Allergies:       Negative hypoglycemia  Psychiatric/Behavioral: Positive for depression. The patient has insomnia.     PHYSICAL EXAM: Blood pressure 114/78, pulse 77, temperature 98.5 F (36.9 C), temperature source Oral, height 5\' 5"  (1.651 m), weight 253 lb (114.8 kg), SpO2 100 %. Body mass index is 42.1 kg/m. Physical Exam  Constitutional: She is oriented to person, place, and time. She appears well-developed and well-nourished.  Cardiovascular: Normal rate.   Pulmonary/Chest: Effort normal.  Musculoskeletal: Normal range of motion.  Neurological: She is oriented to person, place, and time.  Skin: Skin is warm and dry.  Psychiatric: She has a normal mood and affect. Her behavior is  normal.  Vitals reviewed.   RECENT LABS AND TESTS: BMET    Component Value Date/Time   NA 142 01/02/2016 1503   K 3.7 01/02/2016 1503   CL 100 01/02/2016 1503   CO2 25 01/02/2016 1503   GLUCOSE 116 (H) 01/02/2016 1503   GLUCOSE 99 09/26/2014 2012   BUN 9 01/02/2016 1503   CREATININE 0.56 (L) 01/02/2016 1503   CREATININE 0.65 05/05/2014 0902   CALCIUM 9.3 01/02/2016 1503   GFRNONAA 115 01/02/2016 1503   GFRNONAA >89 05/05/2014 0902   GFRAA 132 01/02/2016 1503   GFRAA >89 05/05/2014 0902   Lab Results  Component Value Date   HGBA1C 6.3 (H) 01/02/2016   HGBA1C WILL FOLLOW 01/02/2016   HGBA1C 6.3 (H) 08/24/2015   HGBA1C 6 02/14/2015   HGBA1C 6.1 (H) 05/05/2014   Lab Results  Component Value Date   INSULIN 15.3 01/02/2016   INSULIN WILL FOLLOW 01/02/2016   CBC    Component Value Date/Time   WBC 9.4 01/02/2016 1503   WBC 10.7 (H) 09/26/2014 2012   RBC 4.36 01/02/2016 1503   RBC 4.53 09/26/2014 2012   HGB 13.6 09/26/2014 2012   HCT 39.0 01/02/2016 1503   PLT 323 09/26/2014 2012   MCV 89 01/02/2016 1503   MCH 30.0 01/02/2016 1503   MCH 30.0 09/26/2014 2012   MCHC 33.6 01/02/2016 1503   MCHC 34.0 09/26/2014 2012   RDW 12.6 01/02/2016 1503   LYMPHSABS 2.6 01/02/2016 1503   MONOABS 0.6 05/05/2014 0902   EOSABS 0.1 01/02/2016 1503   BASOSABS 0.0 01/02/2016 1503   Iron/TIBC/Ferritin/ %Sat No results found for: IRON, TIBC, FERRITIN, IRONPCTSAT Lipid Panel     Component Value Date/Time   CHOL 188 01/02/2016 1503   TRIG 89 01/02/2016 1503   HDL 52 01/02/2016 1503   CHOLHDL 3.4 05/05/2014 0902   VLDL 16 05/05/2014 0902   LDLCALC  118 (H) 01/02/2016 1503   Hepatic Function Panel     Component Value Date/Time   PROT 7.4 01/02/2016 1503   ALBUMIN 4.4 01/02/2016 1503   AST 16 01/02/2016 1503   ALT 18 01/02/2016 1503   ALKPHOS 112 01/02/2016 1503   BILITOT 0.4 01/02/2016 1503      Component Value Date/Time   TSH 2.940 01/02/2016 1503   TSH 0.019 (L)  01/02/2016 1449   TSH 2.053 05/05/2014 0902    ASSESSMENT AND PLAN: Depression, unspecified depression type  Type 2 diabetes mellitus without complication, without long-term current use of insulin (HCC)  Vitamin D deficiency - Plan: Cholecalciferol (VITAMIN D3) 50000 units CAPS  Morbid obesity ([place])  PLAN:  Vitamin D Deficiency Jasmine Martinez was informed that low vitamin D levels contributes to fatigue and are associated with obesity, breast, and colon cancer. She agrees to continue to take prescription Vit D @50 ,000 IU every week, will refill x 1 month and will follow up for routine testing of vitamin D, at least 2-3 times per year. She was informed of the risk of over-replacement of vitamin D and agrees to not increase her dose unless he discusses this with [place] first.  Depression with Emotional Eating Behaviors We discussed behavior modification techniques today to help Jasmine Martinez deal with her emotional eating and depression. She has agreed to continue to take Wellbutrin SR 150 mg qd and will work on cognitive behavior therapy to help with depression and agreed to follow up as directed.  Diabetes II Jasmine Martinez has been given extensive diabetes education by myself today including ideal fasting and post-prandial blood glucose readings, individual ideal HgA1c goals  and hypoglycemia prevention. We discussed the importance of good blood sugar control to decrease the likelihood of diabetic complications such as nephropathy, neuropathy, limb loss, blindness, coronary artery disease, and death. We discussed the importance of intensive lifestyle modification including diet, exercise and weight loss as the first line treatment for diabetes. Jasmine Martinez agrees to get back to diet plan and to continue her diabetes medications and will follow up at the agreed upon time.  Obesity  Jasmine Martinez is currently in the action stage of change. As such, her goal is to get back to weightloss efforts  She has agreed to follow  the Category 2 plan Jasmine Martinez has been instructed to work up to a goal of 150 minutes of combined cardio and strengthening exercise per week for weight loss and overall health benefits. We discussed the following Behavioral Modification Stratagies today: decreasing simple carbohydrates , increasing vegetables, work on meal planning and easy cooking plans and emotional eating strategies  Kalyiah has agreed to follow up with our clinic in 2 weeks. She was informed of the importance of frequent follow up visits to maximize her success with intensive lifestyle modifications for her multiple health conditions.  I, Doreene Nest, am acting as scribe for Dennard Nip, MD  I have reviewed the above documentation for accuracy and completeness, and I agree with the above. -Dennard Nip, MD

## 2016-02-06 ENCOUNTER — Ambulatory Visit (INDEPENDENT_AMBULATORY_CARE_PROVIDER_SITE_OTHER): Payer: 59 | Admitting: Family Medicine

## 2016-02-06 MED ORDER — VITAMIN D3 1.25 MG (50000 UT) PO CAPS: 1.0000 | ORAL_CAPSULE | ORAL | 0 refills | Status: DC

## 2016-02-06 MED FILL — VIT D3-50 50,000 UNITS CAPS: 1.25 MG | 28 days supply | Qty: 4 | Fill #0

## 2016-02-13 DIAGNOSIS — F330 Major depressive disorder, recurrent, mild: Secondary | ICD-10-CM | POA: Diagnosis not present

## 2016-02-13 DIAGNOSIS — F411 Generalized anxiety disorder: Secondary | ICD-10-CM | POA: Diagnosis not present

## 2016-02-19 ENCOUNTER — Ambulatory Visit (INDEPENDENT_AMBULATORY_CARE_PROVIDER_SITE_OTHER): Payer: 59 | Admitting: Family Medicine

## 2016-02-19 VITALS — BP 129/77 | HR 66 | Temp 98.7°F | Ht 65.0 in | Wt 253.0 lb

## 2016-02-19 DIAGNOSIS — F3289 Other specified depressive episodes: Secondary | ICD-10-CM | POA: Diagnosis not present

## 2016-02-19 DIAGNOSIS — E119 Type 2 diabetes mellitus without complications: Secondary | ICD-10-CM

## 2016-02-19 MED ORDER — BUPROPION HCL ER (SR) 200 MG PO TB12: 200.0000 mg | ORAL_TABLET | Freq: Every day | ORAL | 0 refills | Status: DC

## 2016-02-19 MED FILL — BUPROPION HCL SR 200 MG TAB: 200 | 30 days supply | Qty: 30 | Fill #0

## 2016-02-19 NOTE — Progress Notes (Signed)
Office: [phone]  /  Fax: [phone]   HPI:   Chief Complaint: OBESITY Jasmine Martinez is here to discuss her progress with her obesity treatment plan. She is following her eating plan approximately 25 % of the time and states she is exercising 0 minutes 0 times per week. Jasmine Martinez is currently struggling with night time snacking, increased simple carbohydrates and increased temptations in the house since getting off track while she was sick. Her weight is 253 lb (114.8 kg) today and she has maintained her weight over the past 2 weeks since her last visit. She has lost 7 lbs since starting treatment with [place].  Depression/Emotional Eating Jasmine Martinez started Wellbutrin and thinks it may be helping her mood but she is still struggling significantly with emotional eating. She denies suicidal ideas or homicidal ideas.  Diabetes II Jasmine Martinez has a diagnosis of diabetes type II. Jasmine Martinez has no blood sugar log today and denies any hypoglycemic episodes. She has been sick since her last visit. Last A1c was 6.3 and she is currently on Victoza 1.2 mg. She notes polyphagia. She has been working on intensive lifestyle modifications including diet, exercise, and weight loss to help control her blood glucose levels.   Wt Readings from Last 500 Encounters:  02/19/16 253 lb (114.8 kg)  02/05/16 253 lb (114.8 kg)  01/29/16 243 lb (110.2 kg)  01/24/16 252 lb (114.3 kg)  01/10/16 256 lb (116.1 kg)  01/02/16 260 lb 9.8 oz (118.2 kg)  04/26/15 252 lb (114.3 kg)  02/14/15 258 lb 3.2 oz (117.1 kg)  02/14/15 258 lb 3.2 oz (117.1 kg)  11/22/14 265 lb (120.2 kg)  11/15/14 270 lb 8 oz (122.7 kg)  11/01/14 260 lb (117.9 kg)  10/03/14 274 lb 6.4 oz (124.5 kg)  09/26/14 270 lb (122.5 kg)  06/23/14 267 lb 9.6 oz (121.4 kg)  05/06/14 258 lb (117 kg)  12/13/13 245 lb (111.1 kg)  11/15/13 244 lb 9.6 oz (110.9 kg)  09/24/12 270 lb (122.5 kg)  08/21/12 271 lb (122.9 kg)  08/06/12 268 lb (121.6 kg)  06/13/12 268 lb  (121.6 kg)  05/27/12 266 lb (120.7 kg)  02/21/12 259 lb 8 oz (117.7 kg)  11/16/11 259 lb 9.6 oz (117.8 kg)  01/15/11 257 lb 4 oz (116.7 kg)     ALLERGIES: Allergies  Allergen Reactions  . Codeine Shortness Of Breath  . Hydrocodone Shortness Of Breath    Mild SOB    MEDICATIONS: Current Outpatient Prescriptions on File Prior to Visit  Medication Sig Dispense Refill  . ALPRAZolam (XANAX) 0.5 MG tablet Take 1 tablet (0.5 mg total) by mouth at bedtime. 90 tablet 0  . aspirin EC 81 MG tablet Take 81 mg by mouth daily.    Marland Kitchen atorvastatin (LIPITOR) 10 MG tablet Take 1 tablet (10 mg total) by mouth daily. 30 tablet 0  . butalbital-acetaminophen-caffeine (FIORICET, ESGIC) 50-325-40 MG per tablet TAKE 1-2 TABLETS BY MOUTH EVERY 6 HOURS AS NEEDED FOR HEADACHE 20 tablet 0  . Cholecalciferol (VITAMIN D3) 50000 units CAPS Take 1 Dose by mouth once a week. 4 capsule 0  . cyclobenzaprine (FLEXERIL) 10 MG tablet Take 1 tablet (10 mg total) by mouth 3 (three) times daily as needed for muscle spasms. 45 tablet 4  . HYDROmorphone (DILAUDID) 4 MG tablet Take 1 tablet (4 mg total) by mouth every 4 (four) hours as needed for severe pain. 30 tablet 0  . levofloxacin (LEVAQUIN) 500 MG tablet Take 1 tablet (500 mg total) by mouth  daily. 10 tablet 0  . levonorgestrel (MIRENA) 20 MCG/24HR IUD 1 each by Intrauterine route once.    . lidocaine (LIDODERM) 5 % Place 1 patch onto the skin daily. Remove & Discard patch within 12 hours or as directed by MD 30 patch 0  . liraglutide (VICTOZA) 18 MG/3ML SOPN Inject 0.2 mLs (1.2 mg total) into the skin every morning. 2 pen 0  . Multiple Vitamin (MULTIVITAMIN WITH MINERALS) TABS tablet Take 1 tablet by mouth daily.    . mupirocin ointment (BACTROBAN) 2 % Place 1 application into the nose 2 (two) times daily. Use on foot twice daily 22 g 0  . NOVOFINE 32G X 6 MM MISC Inject 1 Stick as directed daily. 90 each 5  . TRUE METRIX BLOOD GLUCOSE TEST test strip Apply 1 strip  topically daily.  3  . TRUEPLUS LANCETS 30G MISC Inject 1 Stick as directed daily. 100 each 6   No current facility-administered medications on file prior to visit.     PAST MEDICAL HISTORY: Past Medical History:  Diagnosis Date  . Anxiety   . Back pain   . Chest pain   . Depression   . Diabetes mellitus   . Diarrhea   . Edema    feet and legs  . Gallbladder problem   . 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 GERD (gastroesophageal reflux disease)   . Heartburn   . HTN (hypertension)   . Hyperlipidemia   . Obesity   . Palpitations   . PMDD (premenstrual dysphoric disorder) 01/02/2016  . Shortness of breath   . Vitamin D deficiency   . Vitamin D deficiency     PAST SURGICAL HISTORY: Past Surgical History:  Procedure Laterality Date  . APPENDECTOMY    . CHOLECYSTECTOMY    . EUS N/A 03/23/2014   Procedure: ESOPHAGEAL ENDOSCOPIC ULTRASOUND (EUS) RADIAL;  Surgeon: Arta Silence, MD;  Location: [facility];  Service: Endoscopy;  Laterality: N/A;  . FINE NEEDLE ASPIRATION N/A 03/23/2014   Procedure: FINE NEEDLE ASPIRATION (FNA) LINEAR;  Surgeon: Arta Silence, MD;  Location: [facility];  Service: Endoscopy;  Laterality: N/A;  . TONSILLECTOMY      SOCIAL HISTORY: Social History  Substance Use Topics  . Smoking status: Never Smoker  . Smokeless tobacco: Never Used  . Alcohol use No    FAMILY HISTORY: Family History  Problem Relation Age of Onset  . Healthy Mother   . Hypertension Mother   . Hyperlipidemia Mother   . Alcoholism Mother   . Anxiety disorder Mother   . Drug abuse Mother   . Healthy Father   . Colon cancer Paternal Grandfather 15    colon    ROS: Review of Systems  Constitutional: Negative for weight loss.  Endo/Heme/Allergies:       Positive polyphagia Negative hypoglycemia  Psychiatric/Behavioral: Positive for depression. Negative for suicidal ideas.       Emotional Eating Negative homicidal ideas    PHYSICAL  EXAM: Blood pressure 129/77, pulse 66, temperature 98.7 F (37.1 C), temperature source Oral, height 5\' 5"  (1.651 m), weight 253 lb (114.8 kg), SpO2 100 %. Body mass index is 42.1 kg/m. Physical Exam  Constitutional: She is oriented to person, place, and time. She appears well-developed and well-nourished.  Cardiovascular: Normal rate.   Pulmonary/Chest: Effort normal.  Musculoskeletal: Normal range of motion.  Neurological: She is oriented to person, place, and time.  Skin: Skin is warm and dry.  Vitals reviewed.  RECENT LABS AND TESTS: BMET    Component Value Date/Time   NA 142 01/02/2016 1503   K 3.7 01/02/2016 1503   CL 100 01/02/2016 1503   CO2 25 01/02/2016 1503   GLUCOSE 116 (H) 01/02/2016 1503   GLUCOSE 99 09/26/2014 2012   BUN 9 01/02/2016 1503   CREATININE 0.56 (L) 01/02/2016 1503   CREATININE 0.65 05/05/2014 0902   CALCIUM 9.3 01/02/2016 1503   GFRNONAA 115 01/02/2016 1503   GFRNONAA >89 05/05/2014 0902   GFRAA 132 01/02/2016 1503   GFRAA >89 05/05/2014 0902   Lab Results  Component Value Date   HGBA1C 6.3 (H) 01/02/2016   HGBA1C WILL FOLLOW 01/02/2016   HGBA1C 6.3 (H) 08/24/2015   HGBA1C 6 02/14/2015   HGBA1C 6.1 (H) 05/05/2014   Lab Results  Component Value Date   INSULIN 15.3 01/02/2016   INSULIN WILL FOLLOW 01/02/2016   CBC    Component Value Date/Time   WBC 9.4 01/02/2016 1503   WBC 10.7 (H) 09/26/2014 2012   RBC 4.36 01/02/2016 1503   RBC 4.53 09/26/2014 2012   HGB 13.6 09/26/2014 2012   HCT 39.0 01/02/2016 1503   PLT 323 09/26/2014 2012   MCV 89 01/02/2016 1503   MCH 30.0 01/02/2016 1503   MCH 30.0 09/26/2014 2012   MCHC 33.6 01/02/2016 1503   MCHC 34.0 09/26/2014 2012   RDW 12.6 01/02/2016 1503   LYMPHSABS 2.6 01/02/2016 1503   MONOABS 0.6 05/05/2014 0902   EOSABS 0.1 01/02/2016 1503   BASOSABS 0.0 01/02/2016 1503   Iron/TIBC/Ferritin/ %Sat No results found for: IRON, TIBC, FERRITIN, IRONPCTSAT Lipid Panel     Component Value  Date/Time   CHOL 188 01/02/2016 1503   TRIG 89 01/02/2016 1503   HDL 52 01/02/2016 1503   CHOLHDL 3.4 05/05/2014 0902   VLDL 16 05/05/2014 0902   LDLCALC 118 (H) 01/02/2016 1503   Hepatic Function Panel     Component Value Date/Time   PROT 7.4 01/02/2016 1503   ALBUMIN 4.4 01/02/2016 1503   AST 16 01/02/2016 1503   ALT 18 01/02/2016 1503   ALKPHOS 112 01/02/2016 1503   BILITOT 0.4 01/02/2016 1503      Component Value Date/Time   TSH 2.940 01/02/2016 1503   TSH 0.019 (L) 01/02/2016 1449   TSH 2.053 05/05/2014 0902    ASSESSMENT AND PLAN: Other depression - Plan: buPROPion (WELLBUTRIN SR) 200 MG 12 hr tablet  Type 2 diabetes mellitus without complication, without long-term current use of insulin ([place])  Morbid obesity ([place])  PLAN:  Depression/Emotional Eating Inis agrees to increase to Wellbutrin SR 200 mg every morning #30 with no refills and will follow up at next visit in 7 to 10 days.  Diabetes II Jasmine Martinez has been given extensive diabetes education by myself today including ideal fasting and post-prandial blood glucose readings, individual ideal HgA1c goals  and hypoglycemia prevention. We discussed the importance of good blood sugar control to decrease the likelihood of diabetic complications such as nephropathy, neuropathy, limb loss, blindness, coronary artery disease, and death. We discussed the importance of intensive lifestyle modification including diet, exercise and weight loss as the first line treatment for diabetes. Jasmine Martinez agrees to continue her diabetes medications and will follow up at the agreed upon time. Victoza dose may need to be increased to help with polyphagia at next visit.  Obesity Jasmine Martinez is currently in the action stage of change. As such, her goal is to continue with weight loss efforts She has agreed  to keep a food journal with 1300-1600 calories and 80+ grams of protein daily Jasmine Martinez has been instructed to work up to a goal of 150  minutes of combined cardio and strengthening exercise per week for weight loss and overall health benefits. We discussed the following Behavioral Modification Stratagies today: increasing lean protein intake, decreasing simple carbohydrates , increasing vegetables, increasing lower sugar fruits, work on meal planning and easy cooking plans, ways to avoid boredom eating and ways to avoid night time snacking  Jasmine Martinez has agreed to follow up with our clinic in 7 to 10 days. She was informed of the importance of frequent follow up visits to maximize her success with intensive lifestyle modifications for her multiple health conditions.  I, Doreene Nest, am acting as scribe for Dennard Nip, MD  I have reviewed the above documentation for accuracy and completeness, and I agree with the above. -Dennard Nip, MD

## 2016-02-21 DIAGNOSIS — F411 Generalized anxiety disorder: Secondary | ICD-10-CM | POA: Diagnosis not present

## 2016-02-26 ENCOUNTER — Ambulatory Visit (INDEPENDENT_AMBULATORY_CARE_PROVIDER_SITE_OTHER): Payer: 59 | Admitting: Family Medicine

## 2016-02-26 VITALS — BP 151/81 | HR 82 | Temp 98.4°F | Ht 65.0 in | Wt 249.0 lb

## 2016-02-26 DIAGNOSIS — F3289 Other specified depressive episodes: Secondary | ICD-10-CM | POA: Diagnosis not present

## 2016-02-26 DIAGNOSIS — E559 Vitamin D deficiency, unspecified: Secondary | ICD-10-CM

## 2016-02-26 DIAGNOSIS — E119 Type 2 diabetes mellitus without complications: Secondary | ICD-10-CM | POA: Diagnosis not present

## 2016-02-26 MED ORDER — VITAMIN D (ERGOCALCIFEROL) 1.25 MG (50000 UNIT) PO CAPS: 50000.0000 [IU] | ORAL_CAPSULE | ORAL | 0 refills | Status: DC

## 2016-02-26 NOTE — Progress Notes (Signed)
Office: [phone]  /  Fax: [phone]   HPI:   Chief Complaint: OBESITY Jasmine Martinez is here to discuss her progress with her obesity treatment plan. She is following her eating plan approximately 75 % of the time and states she is exercising 0 minutes 0 times per week. Patient continues to do well with weight loss, she has done much better this week with journaling and making better food choices. She has not started exercising as of yet.  Her weight is 249 lb (112.9 kg) today and has had a weight loss of 4 pounds over a period of 1 week since her last visit. She has lost 11 lbs since starting treatment with [place].  Depression with emotional eating behaviors Jasmine Martinez was struggling with emotional eating and using food for comfort to the extent that it is negatively impacting her health. After starting the Wellbutrin she notes an decrease in emotional eating, no insomina. Her blood pressure is elevated today but states she is under stress today.  She has been working on behavior modification techniques to help reduce her emotional eating and has been very successful. She shows no sign of suicidal or homicidal ideations.  Depression screen [facility] 2/9 01/02/2016 02/14/2015 06/23/2014  Decreased Interest 1 0 1  Down, Depressed, Hopeless 1 0 1  PHQ - 2 Score 2 0 2  Altered sleeping 2 - 1  Tired, decreased energy 3 - 1  Change in appetite 3 - 0  Feeling bad or failure about yourself  3 - 0  Trouble concentrating 0 - 0  Moving slowly or fidgety/restless 0 - 0  Suicidal thoughts 0 - 0  PHQ-9 Score 13 - 4  Difficult doing work/chores - - Not difficult at all   Diabetes II Jasmine Martinez has a diagnosis of diabetes type II. Jasmine Martinez states BGs range between 80 and 120 and denies any hypoglycemic episodes. She does note her BS dropping in the 70's but can feel when she drops low.  Last A1c was Hemoglobin A1C Latest Ref Rng & Units 01/02/2016 01/02/2016 08/24/2015  HGBA1C 4.8 - 5.6 % 6.3(H) WILL FOLLOW 6.3(H)    Some recent data might be hidden    She has been working on intensive lifestyle modifications including diet, exercise, and weight loss to help control her blood glucose levels.  Vitamin D deficiency Jasmine Martinez has a diagnosis of vitamin D deficiency. She is not currently taking vit D and denies nausea, vomiting or muscle weakness. She does have fatigue.   Wt Readings from Last 500 Encounters:  02/26/16 249 lb (112.9 kg)  02/19/16 253 lb (114.8 kg)  02/05/16 253 lb (114.8 kg)  01/29/16 243 lb (110.2 kg)  01/24/16 252 lb (114.3 kg)  01/10/16 256 lb (116.1 kg)  01/02/16 260 lb 9.8 oz (118.2 kg)  04/26/15 252 lb (114.3 kg)  02/14/15 258 lb 3.2 oz (117.1 kg)  02/14/15 258 lb 3.2 oz (117.1 kg)  11/22/14 265 lb (120.2 kg)  11/15/14 270 lb 8 oz (122.7 kg)  11/01/14 260 lb (117.9 kg)  10/03/14 274 lb 6.4 oz (124.5 kg)  09/26/14 270 lb (122.5 kg)  06/23/14 267 lb 9.6 oz (121.4 kg)  05/06/14 258 lb (117 kg)  12/13/13 245 lb (111.1 kg)  11/15/13 244 lb 9.6 oz (110.9 kg)  09/24/12 270 lb (122.5 kg)  08/21/12 271 lb (122.9 kg)  08/06/12 268 lb (121.6 kg)  06/13/12 268 lb (121.6 kg)  05/27/12 266 lb (120.7 kg)  02/21/12 259 lb 8 oz (117.7 kg)  11/16/11 259 lb 9.6 oz (117.8 kg)  01/15/11 257 lb 4 oz (116.7 kg)     ALLERGIES: Allergies  Allergen Reactions  . Codeine Shortness Of Breath  . Hydrocodone Shortness Of Breath    Mild SOB    MEDICATIONS: Current Outpatient Prescriptions on File Prior to Visit  Medication Sig Dispense Refill  . ALPRAZolam (XANAX) 0.5 MG tablet Take 1 tablet (0.5 mg total) by mouth at bedtime. 90 tablet 0  . aspirin EC 81 MG tablet Take 81 mg by mouth daily.    Marland Kitchen atorvastatin (LIPITOR) 10 MG tablet Take 1 tablet (10 mg total) by mouth daily. 30 tablet 0  . buPROPion (WELLBUTRIN SR) 200 MG 12 hr tablet Take 1 tablet (200 mg total) by mouth daily. 30 tablet 0  . butalbital-acetaminophen-caffeine (FIORICET, ESGIC) 50-325-40 MG per tablet TAKE 1-2 TABLETS BY  MOUTH EVERY 6 HOURS AS NEEDED FOR HEADACHE 20 tablet 0  . Cholecalciferol (VITAMIN D3) 50000 units CAPS Take 1 Dose by mouth once a week. 4 capsule 0  . cyclobenzaprine (FLEXERIL) 10 MG tablet Take 1 tablet (10 mg total) by mouth 3 (three) times daily as needed for muscle spasms. 45 tablet 4  . HYDROmorphone (DILAUDID) 4 MG tablet Take 1 tablet (4 mg total) by mouth every 4 (four) hours as needed for severe pain. 30 tablet 0  . levofloxacin (LEVAQUIN) 500 MG tablet Take 1 tablet (500 mg total) by mouth daily. 10 tablet 0  . levonorgestrel (MIRENA) 20 MCG/24HR IUD 1 each by Intrauterine route once.    . lidocaine (LIDODERM) 5 % Place 1 patch onto the skin daily. Remove & Discard patch within 12 hours or as directed by MD 30 patch 0  . liraglutide (VICTOZA) 18 MG/3ML SOPN Inject 0.2 mLs (1.2 mg total) into the skin every morning. 2 pen 0  . Multiple Vitamin (MULTIVITAMIN WITH MINERALS) TABS tablet Take 1 tablet by mouth daily.    . mupirocin ointment (BACTROBAN) 2 % Place 1 application into the nose 2 (two) times daily. Use on foot twice daily 22 g 0  . NOVOFINE 32G X 6 MM MISC Inject 1 Stick as directed daily. 90 each 5  . TRUE METRIX BLOOD GLUCOSE TEST test strip Apply 1 strip topically daily.  3  . TRUEPLUS LANCETS 30G MISC Inject 1 Stick as directed daily. 100 each 6   No current facility-administered medications on file prior to visit.     PAST MEDICAL HISTORY: Past Medical History:  Diagnosis Date  . Anxiety   . Back pain   . Chest pain   . Depression   . Diabetes mellitus   . Diarrhea   . Edema    feet and legs  . Gallbladder problem   . 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 GERD (gastroesophageal reflux disease)   . Heartburn   . HTN (hypertension)   . Hyperlipidemia   . Obesity   . Palpitations   . PMDD (premenstrual dysphoric disorder) 01/02/2016  . Shortness of breath   . Vitamin D deficiency   . Vitamin D deficiency     PAST SURGICAL  HISTORY: Past Surgical History:  Procedure Laterality Date  . APPENDECTOMY    . CHOLECYSTECTOMY    . EUS N/A 03/23/2014   Procedure: ESOPHAGEAL ENDOSCOPIC ULTRASOUND (EUS) RADIAL;  Surgeon: Arta Silence, MD;  Location: [facility];  Service: Endoscopy;  Laterality: N/A;  . FINE NEEDLE ASPIRATION N/A 03/23/2014   Procedure: FINE NEEDLE ASPIRATION (  FNA) LINEAR;  Surgeon: Arta Silence, MD;  Location: [facility];  Service: Endoscopy;  Laterality: N/A;  . TONSILLECTOMY      SOCIAL HISTORY: Social History  Substance Use Topics  . Smoking status: Never Smoker  . Smokeless tobacco: Never Used  . Alcohol use No    FAMILY HISTORY: Family History  Problem Relation Age of Onset  . Healthy Mother   . Hypertension Mother   . Hyperlipidemia Mother   . Alcoholism Mother   . Anxiety disorder Mother   . Drug abuse Mother   . Healthy Father   . Colon cancer Paternal Grandfather 78    colon    ROS: Review of Systems  Constitutional: Positive for malaise/fatigue and weight loss.  All other systems reviewed and are negative.   PHYSICAL EXAM: Blood pressure (!) 151/81, pulse 82, temperature 98.4 F (36.9 C), temperature source Oral, height 5\' 5"  (1.651 m), weight 249 lb (112.9 kg), SpO2 99 %. Body mass index is 41.44 kg/m. Physical Exam  Constitutional: She is oriented to person, place, and time. She appears well-developed and well-nourished.  HENT:  Head: Normocephalic and atraumatic.  Eyes: EOM are normal.  Neck: Normal range of motion.  Cardiovascular: Normal rate and regular rhythm.   Pulmonary/Chest: Effort normal.  Musculoskeletal: Normal range of motion.  Neurological: She is oriented to person, place, and time.  Skin: Skin is warm and dry.  Psychiatric: She has a normal mood and affect. Her behavior is normal.  Vitals reviewed.   RECENT LABS AND TESTS: BMET    Component Value Date/Time   NA 142 01/02/2016 1503   K 3.7 01/02/2016 1503   CL 100 01/02/2016 1503    CO2 25 01/02/2016 1503   GLUCOSE 116 (H) 01/02/2016 1503   GLUCOSE 99 09/26/2014 2012   BUN 9 01/02/2016 1503   CREATININE 0.56 (L) 01/02/2016 1503   CREATININE 0.65 05/05/2014 0902   CALCIUM 9.3 01/02/2016 1503   GFRNONAA 115 01/02/2016 1503   GFRNONAA >89 05/05/2014 0902   GFRAA 132 01/02/2016 1503   GFRAA >89 05/05/2014 0902   Lab Results  Component Value Date   HGBA1C 6.3 (H) 01/02/2016   HGBA1C WILL FOLLOW 01/02/2016   HGBA1C 6.3 (H) 08/24/2015   HGBA1C 6 02/14/2015   HGBA1C 6.1 (H) 05/05/2014   Lab Results  Component Value Date   INSULIN 15.3 01/02/2016   INSULIN WILL FOLLOW 01/02/2016   CBC    Component Value Date/Time   WBC 9.4 01/02/2016 1503   WBC 10.7 (H) 09/26/2014 2012   RBC 4.36 01/02/2016 1503   RBC 4.53 09/26/2014 2012   HGB 13.6 09/26/2014 2012   HCT 39.0 01/02/2016 1503   PLT 323 09/26/2014 2012   MCV 89 01/02/2016 1503   MCH 30.0 01/02/2016 1503   MCH 30.0 09/26/2014 2012   MCHC 33.6 01/02/2016 1503   MCHC 34.0 09/26/2014 2012   RDW 12.6 01/02/2016 1503   LYMPHSABS 2.6 01/02/2016 1503   MONOABS 0.6 05/05/2014 0902   EOSABS 0.1 01/02/2016 1503   BASOSABS 0.0 01/02/2016 1503   Iron/TIBC/Ferritin/ %Sat No results found for: IRON, TIBC, FERRITIN, IRONPCTSAT Lipid Panel     Component Value Date/Time   CHOL 188 01/02/2016 1503   TRIG 89 01/02/2016 1503   HDL 52 01/02/2016 1503   CHOLHDL 3.4 05/05/2014 0902   VLDL 16 05/05/2014 0902   LDLCALC 118 (H) 01/02/2016 1503   Hepatic Function Panel     Component Value Date/Time   PROT 7.4 01/02/2016  1503   ALBUMIN 4.4 01/02/2016 1503   AST 16 01/02/2016 1503   ALT 18 01/02/2016 1503   ALKPHOS 112 01/02/2016 1503   BILITOT 0.4 01/02/2016 1503      Component Value Date/Time   TSH 2.940 01/02/2016 1503   TSH 0.019 (L) 01/02/2016 1449   TSH 2.053 05/05/2014 0902    ASSESSMENT AND PLAN: Vitamin D deficiency - Plan: Vitamin D, Ergocalciferol, (DRISDOL) 50000 units CAPS capsule  Type 2  diabetes mellitus without complication, without long-term current use of insulin (HCC)  Other depression  Morbid obesity ([place])  PLAN: Depression with Emotional Eating Behaviors We discussed behavior modification techniques today to help Jasmine Martinez deal with her emotional eating and depression. She has agreed to take Wellbutrin SR 150 mg qd on her last visit and agreed to follow up as directed. We will follow up on her blood pressure at that time.   Diabetes II Jasmine Martinez has been given extensive diabetes education by myself today including ideal fasting and post-prandial blood glucose readings, individual ideal HgA1c goals  and hypoglycemia prevention. We discussed the importance of good blood sugar control to decrease the likelihood of diabetic complications such as nephropathy, neuropathy, limb loss, blindness, coronary artery disease, and death. We discussed the importance of intensive lifestyle modification including diet, exercise and weight loss as the first line treatment for diabetes. Jasmine Martinez agrees to continue her diabetes medications, check her BS twice daily and will follow up at the agreed upon time.    Vitamin D Deficiency Jasmine Martinez was informed that low vitamin D levels contributes to fatigue and are associated with obesity, breast, and colon cancer. She agrees to continue to take prescription Vit D @50 ,000 IU every week, prescription was sent to her pharmacy with a quantity of 4 and no refills and will follow up for routine testing of vitamin D, at least 2-3 times per year. She was informed of the risk of over-replacement of vitamin D and agrees to not increase her dose unless he discusses this with [place] first.  Obesity Jasmine Martinez is currently in the action stage of change. As such, her goal is to continue with weight loss efforts She has agreed to follow the Category 2 plan Jasmine Martinez has been instructed to work up to a goal of 150 minutes of combined cardio and strengthening exercise per  week for weight loss and overall health benefits.She will start cardio/ stretching 15 mins 3 times a week. We discussed the following Behavioral Modification Stratagies today: increasing lean protein intake, decreasing simple carbohydrates  and increasing lower sugar fruits  Jasmine Martinez has agreed to follow up with our clinic in 2 weeks. She was informed of the importance of frequent follow up visits to maximize her success with intensive lifestyle modifications for her multiple health conditions.  I, April Moore, am acting as Education administrator for Dennard Nip, MD  I have reviewed the above documentation for accuracy and completeness, and I agree with the above. -Dennard Nip, MD

## 2016-02-27 DIAGNOSIS — F411 Generalized anxiety disorder: Secondary | ICD-10-CM | POA: Diagnosis not present

## 2016-03-04 ENCOUNTER — Ambulatory Visit (INDEPENDENT_AMBULATORY_CARE_PROVIDER_SITE_OTHER): Payer: 59 | Admitting: Family Medicine

## 2016-03-04 VITALS — BP 121/83 | HR 87 | Temp 98.9°F | Ht 65.0 in | Wt 249.0 lb

## 2016-03-04 DIAGNOSIS — F3289 Other specified depressive episodes: Secondary | ICD-10-CM

## 2016-03-04 DIAGNOSIS — R030 Elevated blood-pressure reading, without diagnosis of hypertension: Secondary | ICD-10-CM | POA: Diagnosis not present

## 2016-03-04 NOTE — Progress Notes (Signed)
Office: [phone]  /  Fax: [phone]   HPI:   Chief Complaint: OBESITY Jasmine Martinez is here to discuss her progress with her obesity treatment plan. She is following her eating plan approximately 75 % of the time and states she is exercising 20 minutes 3 times per week. Patient has done well with maintaining weight in the last week, has not been able to journal, mostly portion control and smart choices.  Her weight is 249 lb (112.9 kg) today and . She has lost 7 lbs since starting treatment with [place].   Depression with emotional eating behaviors Jasmine Martinez is struggling with emotional eating and using food for comfort to the extent that it is negatively impacting her health.  Jasmine Martinez states her emotional eating has improved in the last 2 weeks on Wellbutrin. She denies insomnia and her blood pressure is stable. She has been working on behavior modification techniques to help reduce her emotional eating and has been very successful but her situation is still difficult. She shows no sign of suicidal or homicidal ideations.  Depression screen Jasmine Martinez 2/9 01/02/2016 02/14/2015 06/23/2014  Decreased Interest 1 0 1  Down, Depressed, Hopeless 1 0 1  PHQ - 2 Score 2 0 2  Altered sleeping 2 - 1  Tired, decreased energy 3 - 1  Change in appetite 3 - 0  Feeling bad or failure about yourself  3 - 0  Trouble concentrating 0 - 0  Moving slowly or fidgety/restless 0 - 0  Suicidal thoughts 0 - 0  PHQ-9 Score 13 - 4  Difficult doing work/chores - - Not difficult at all   Elevated Blood Pressure without diagnosis of Hypertension Her blood pressure is improved from last visit, without starting medicine and she denies headache and chest pain.     Wt Readings from Last 500 Encounters:  03/04/16 249 lb (112.9 kg)  02/26/16 249 lb (112.9 kg)  02/19/16 253 lb (114.8 kg)  02/05/16 253 lb (114.8 kg)  01/29/16 243 lb (110.2 kg)  01/24/16 252 lb (114.3 kg)  01/10/16 256 lb (116.1 kg)  01/02/16 260 lb 9.8 oz  (118.2 kg)  04/26/15 252 lb (114.3 kg)  02/14/15 258 lb 3.2 oz (117.1 kg)  02/14/15 258 lb 3.2 oz (117.1 kg)  11/22/14 265 lb (120.2 kg)  11/15/14 270 lb 8 oz (122.7 kg)  11/01/14 260 lb (117.9 kg)  10/03/14 274 lb 6.4 oz (124.5 kg)  09/26/14 270 lb (122.5 kg)  06/23/14 267 lb 9.6 oz (121.4 kg)  05/06/14 258 lb (117 kg)  12/13/13 245 lb (111.1 kg)  11/15/13 244 lb 9.6 oz (110.9 kg)  09/24/12 270 lb (122.5 kg)  08/21/12 271 lb (122.9 kg)  08/06/12 268 lb (121.6 kg)  06/13/12 268 lb (121.6 kg)  05/27/12 266 lb (120.7 kg)  02/21/12 259 lb 8 oz (117.7 kg)  11/16/11 259 lb 9.6 oz (117.8 kg)  01/15/11 257 lb 4 oz (116.7 kg)     ALLERGIES: Allergies  Allergen Reactions  . Codeine Shortness Of Breath  . Hydrocodone Shortness Of Breath    Mild SOB    MEDICATIONS: Current Outpatient Prescriptions on File Prior to Visit  Medication Sig Dispense Refill  . ALPRAZolam (XANAX) 0.5 MG tablet Take 1 tablet (0.5 mg total) by mouth at bedtime. 90 tablet 0  . aspirin EC 81 MG tablet Take 81 mg by mouth daily.    Marland Kitchen atorvastatin (LIPITOR) 10 MG tablet Take 1 tablet (10 mg total) by mouth daily. 30 tablet 0  .  buPROPion (WELLBUTRIN SR) 200 MG 12 hr tablet Take 1 tablet (200 mg total) by mouth daily. 30 tablet 0  . butalbital-acetaminophen-caffeine (FIORICET, ESGIC) 50-325-40 MG per tablet TAKE 1-2 TABLETS BY MOUTH EVERY 6 HOURS AS NEEDED FOR HEADACHE 20 tablet 0  . Cholecalciferol (VITAMIN D3) 50000 units CAPS Take 1 Dose by mouth once a week. 4 capsule 0  . cyclobenzaprine (FLEXERIL) 10 MG tablet Take 1 tablet (10 mg total) by mouth 3 (three) times daily as needed for muscle spasms. 45 tablet 4  . HYDROmorphone (DILAUDID) 4 MG tablet Take 1 tablet (4 mg total) by mouth every 4 (four) hours as needed for severe pain. 30 tablet 0  . levofloxacin (LEVAQUIN) 500 MG tablet Take 1 tablet (500 mg total) by mouth daily. 10 tablet 0  . levonorgestrel (MIRENA) 20 MCG/24HR IUD 1 each by Intrauterine route  once.    . lidocaine (LIDODERM) 5 % Place 1 patch onto the skin daily. Remove & Discard patch within 12 hours or as directed by MD 30 patch 0  . liraglutide (VICTOZA) 18 MG/3ML SOPN Inject 0.2 mLs (1.2 mg total) into the skin every morning. 2 pen 0  . Multiple Vitamin (MULTIVITAMIN WITH MINERALS) TABS tablet Take 1 tablet by mouth daily.    . mupirocin ointment (BACTROBAN) 2 % Place 1 application into the nose 2 (two) times daily. Use on foot twice daily 22 g 0  . NOVOFINE 32G X 6 MM MISC Inject 1 Stick as directed daily. 90 each 5  . TRUE METRIX BLOOD GLUCOSE TEST test strip Apply 1 strip topically daily.  3  . TRUEPLUS LANCETS 30G MISC Inject 1 Stick as directed daily. 100 each 6  . Vitamin D, Ergocalciferol, (DRISDOL) 50000 units CAPS capsule Take 1 capsule (50,000 Units total) by mouth every 7 (seven) days. 4 capsule 0   No current Martinez-administered medications on file prior to visit.     PAST MEDICAL HISTORY: Past Medical History:  Diagnosis Date  . Anxiety   . Back pain   . Chest pain   . Depression   . Diabetes mellitus   . Diarrhea   . Edema    feet and legs  . Gallbladder problem   . 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 GERD (gastroesophageal reflux disease)   . Heartburn   . HTN (hypertension)   . Hyperlipidemia   . Obesity   . Palpitations   . PMDD (premenstrual dysphoric disorder) 01/02/2016  . Shortness of breath   . Vitamin D deficiency   . Vitamin D deficiency     PAST SURGICAL HISTORY: Past Surgical History:  Procedure Laterality Date  . APPENDECTOMY    . CHOLECYSTECTOMY    . EUS N/A 03/23/2014   Procedure: ESOPHAGEAL ENDOSCOPIC ULTRASOUND (EUS) RADIAL;  Surgeon: Arta Silence, MD;  Location: [facility];  Service: Endoscopy;  Laterality: N/A;  . FINE NEEDLE ASPIRATION N/A 03/23/2014   Procedure: FINE NEEDLE ASPIRATION (FNA) LINEAR;  Surgeon: Arta Silence, MD;  Location: [facility];  Service: Endoscopy;  Laterality: N/A;    . TONSILLECTOMY      SOCIAL HISTORY: Social History  Substance Use Topics  . Smoking status: Never Smoker  . Smokeless tobacco: Never Used  . Alcohol use No    FAMILY HISTORY: Family History  Problem Relation Age of Onset  . Healthy Mother   . Hypertension Mother   . Hyperlipidemia Mother   . Alcoholism Mother   . Anxiety disorder Mother   .  Drug abuse Mother   . Healthy Father   . Colon cancer Paternal Grandfather 57    colon    ROS: Review of Systems  Psychiatric/Behavioral: Positive for depression.  All other systems reviewed and are negative.   PHYSICAL EXAM: Blood pressure 121/83, pulse 87, temperature 98.9 F (37.2 C), temperature source Oral, height 5\' 5"  (1.651 m), weight 249 lb (112.9 kg), SpO2 100 %. Body mass index is 41.44 kg/m. Physical Exam  Constitutional: She is oriented to person, place, and time. She appears well-developed and well-nourished.  HENT:  Head: Normocephalic and atraumatic.  Eyes: Pupils are equal, round, and reactive to light.  Neck: Normal range of motion.  Cardiovascular: Normal rate.   Pulmonary/Chest: Effort normal.  Abdominal: Soft.  Musculoskeletal: Normal range of motion.  Neurological: She is alert and oriented to person, place, and time.  Skin: Skin is warm and dry.  Psychiatric: She has a normal mood and affect. Her behavior is normal.  Vitals reviewed.   RECENT LABS AND TESTS: BMET    Component Value Date/Time   NA 142 01/02/2016 1503   K 3.7 01/02/2016 1503   CL 100 01/02/2016 1503   CO2 25 01/02/2016 1503   GLUCOSE 116 (H) 01/02/2016 1503   GLUCOSE 99 09/26/2014 2012   BUN 9 01/02/2016 1503   CREATININE 0.56 (L) 01/02/2016 1503   CREATININE 0.65 05/05/2014 0902   CALCIUM 9.3 01/02/2016 1503   GFRNONAA 115 01/02/2016 1503   GFRNONAA >89 05/05/2014 0902   GFRAA 132 01/02/2016 1503   GFRAA >89 05/05/2014 0902   Lab Results  Component Value Date   HGBA1C 6.3 (H) 01/02/2016   HGBA1C WILL FOLLOW  01/02/2016   HGBA1C 6.3 (H) 08/24/2015   HGBA1C 6 02/14/2015   HGBA1C 6.1 (H) 05/05/2014   Lab Results  Component Value Date   INSULIN 15.3 01/02/2016   INSULIN WILL FOLLOW 01/02/2016   CBC    Component Value Date/Time   WBC 9.4 01/02/2016 1503   WBC 10.7 (H) 09/26/2014 2012   RBC 4.36 01/02/2016 1503   RBC 4.53 09/26/2014 2012   HGB 13.6 09/26/2014 2012   HCT 39.0 01/02/2016 1503   PLT 323 09/26/2014 2012   MCV 89 01/02/2016 1503   MCH 30.0 01/02/2016 1503   MCH 30.0 09/26/2014 2012   MCHC 33.6 01/02/2016 1503   MCHC 34.0 09/26/2014 2012   RDW 12.6 01/02/2016 1503   LYMPHSABS 2.6 01/02/2016 1503   MONOABS 0.6 05/05/2014 0902   EOSABS 0.1 01/02/2016 1503   BASOSABS 0.0 01/02/2016 1503   Iron/TIBC/Ferritin/ %Sat No results found for: IRON, TIBC, FERRITIN, IRONPCTSAT Lipid Panel     Component Value Date/Time   CHOL 188 01/02/2016 1503   TRIG 89 01/02/2016 1503   HDL 52 01/02/2016 1503   CHOLHDL 3.4 05/05/2014 0902   VLDL 16 05/05/2014 0902   LDLCALC 118 (H) 01/02/2016 1503   Hepatic Function Panel     Component Value Date/Time   PROT 7.4 01/02/2016 1503   ALBUMIN 4.4 01/02/2016 1503   AST 16 01/02/2016 1503   ALT 18 01/02/2016 1503   ALKPHOS 112 01/02/2016 1503   BILITOT 0.4 01/02/2016 1503      Component Value Date/Time   TSH 2.940 01/02/2016 1503   TSH 0.019 (L) 01/02/2016 1449   TSH 2.053 05/05/2014 0902    ASSESSMENT AND PLAN: Other depression  Elevated blood pressure reading without diagnosis of hypertension  Morbid obesity ([place])  PLAN: Depression with Emotional Eating Behaviors We discussed  behavior modification techniques today to help Jasmine Martinez deal with her emotional eating and depression. She has agreed to continue Wellbutrin SR 150 mg qd and agreed to follow up in one week to assess mode. We used cognitive behavioral counseling to help her work through some of her situational depression.  Elevated blood pressure without a history of  Hypertension Will continue to monitor her blood pressure while on Wellbutrin and continue to work on diet/weight loss.   We spent > than 50% of the 30 minute visit on the counseling as documented in the note.  Obesity Jasmine Martinez is currently in the action stage of change. As such, her goal is to continue with weight loss efforts She has agreed to keep a food journal with 1500 calories and 75+ protein daily and follow the Category 2 plan Jasmine Martinez has been instructed to work up to a goal of 150 minutes of combined cardio and strengthening exercise per week for weight loss and overall health benefits. We discussed the following Behavioral Modification Stratagies today: increasing vegetables, work on meal planning and easy cooking plans, dealing with family or coworker sabotage and emotional eating strategies  Jasmine Martinez has agreed to follow up with our clinic in 1 weeks. She was informed of the importance of frequent follow up visits to maximize her success with intensive lifestyle modifications for her multiple health conditions.  We spent > than 50% of the 15 minute visit on the counseling as documented in the note.  I, April Moore, am acting as Education administrator for Dennard Nip, MD  I have reviewed the above documentation for accuracy and completeness, and I agree with the above. -Dennard Nip, MD

## 2016-03-05 DIAGNOSIS — F411 Generalized anxiety disorder: Secondary | ICD-10-CM | POA: Diagnosis not present

## 2016-03-11 ENCOUNTER — Ambulatory Visit (INDEPENDENT_AMBULATORY_CARE_PROVIDER_SITE_OTHER): Payer: 59 | Admitting: Family Medicine

## 2016-03-11 VITALS — BP 124/75 | HR 76 | Temp 98.4°F | Ht 65.0 in | Wt 247.0 lb

## 2016-03-11 DIAGNOSIS — E669 Obesity, unspecified: Secondary | ICD-10-CM

## 2016-03-11 DIAGNOSIS — E785 Hyperlipidemia, unspecified: Secondary | ICD-10-CM | POA: Diagnosis not present

## 2016-03-11 DIAGNOSIS — IMO0001 Reserved for inherently not codable concepts without codable children: Secondary | ICD-10-CM | POA: Insufficient documentation

## 2016-03-11 DIAGNOSIS — E119 Type 2 diabetes mellitus without complications: Secondary | ICD-10-CM | POA: Diagnosis not present

## 2016-03-11 DIAGNOSIS — Z6841 Body Mass Index (BMI) 40.0 and over, adult: Secondary | ICD-10-CM

## 2016-03-11 MED ORDER — ATORVASTATIN CALCIUM 10 MG PO TABS: 10.0000 mg | ORAL_TABLET | Freq: Every day | ORAL | 0 refills | Status: DC

## 2016-03-11 NOTE — Progress Notes (Signed)
Office: [phone]  /  Fax: [phone]   HPI:   Chief Complaint: OBESITY Jasmine Martinez is here to discuss her progress with her obesity treatment plan. She is following her eating plan approximately 75 to 80 % of the time and states she is walking for exercise 15 to 20 minutes 3 times per week. Jasmine Martinez continues to do well with weight loss. She is journaling but not meeting her protein goal. She is walking for exercise regularly. She notes some challenges with working very long hours and struggling to plan ahead. Her weight is 247 lb (112 kg) today and has had a weight loss of 2 pounds over a period of 1 week since her last visit. She has lost 13 lbs since starting treatment with [place].  Hyperlipidemia Jasmine Martinez has hyperlipidemia and has been trying to improve her cholesterol levels with intensive lifestyle modification including a low saturated fat diet, exercise and weight loss. She is currently on Lipitor and tolerating it well. She denies any chest pain, claudication or myalgias.  Diabetes II Jasmine Martinez has a diagnosis of diabetes type II. Jasmine Martinez states fasting blood sugars are running between 80 to 120 and denies any hypoglycemic episodes. Last A1c was at 6.3 She is currently on Victoza 1.2 mg. She has been working on intensive lifestyle modifications including diet, exercise, and weight loss to help control her blood glucose levels.  Wt Readings from Last 500 Encounters:  03/11/16 247 lb (112 kg)  03/04/16 249 lb (112.9 kg)  02/26/16 249 lb (112.9 kg)  02/19/16 253 lb (114.8 kg)  02/05/16 253 lb (114.8 kg)  01/29/16 243 lb (110.2 kg)  01/24/16 252 lb (114.3 kg)  01/10/16 256 lb (116.1 kg)  01/02/16 260 lb 9.8 oz (118.2 kg)  04/26/15 252 lb (114.3 kg)  02/14/15 258 lb 3.2 oz (117.1 kg)  02/14/15 258 lb 3.2 oz (117.1 kg)  11/22/14 265 lb (120.2 kg)  11/15/14 270 lb 8 oz (122.7 kg)  11/01/14 260 lb (117.9 kg)  10/03/14 274 lb 6.4 oz (124.5 kg)  09/26/14 270 lb (122.5 kg)    06/23/14 267 lb 9.6 oz (121.4 kg)  05/06/14 258 lb (117 kg)  12/13/13 245 lb (111.1 kg)  11/15/13 244 lb 9.6 oz (110.9 kg)  09/24/12 270 lb (122.5 kg)  08/21/12 271 lb (122.9 kg)  08/06/12 268 lb (121.6 kg)  06/13/12 268 lb (121.6 kg)  05/27/12 266 lb (120.7 kg)  02/21/12 259 lb 8 oz (117.7 kg)  11/16/11 259 lb 9.6 oz (117.8 kg)  01/15/11 257 lb 4 oz (116.7 kg)     ALLERGIES: Allergies  Allergen Reactions  . Codeine Shortness Of Breath  . Hydrocodone Shortness Of Breath    Mild SOB    MEDICATIONS: Current Outpatient Prescriptions on File Prior to Visit  Medication Sig Dispense Refill  . buPROPion (WELLBUTRIN SR) 200 MG 12 hr tablet Take 1 tablet (200 mg total) by mouth daily. 30 tablet 0  . levonorgestrel (MIRENA) 20 MCG/24HR IUD 1 each by Intrauterine route once.    . liraglutide (VICTOZA) 18 MG/3ML SOPN Inject 0.2 mLs (1.2 mg total) into the skin every morning. 2 pen 0  . NOVOFINE 32G X 6 MM MISC Inject 1 Stick as directed daily. 90 each 5  . TRUE METRIX BLOOD GLUCOSE TEST test strip Apply 1 strip topically daily.  3  . TRUEPLUS LANCETS 30G MISC Inject 1 Stick as directed daily. 100 each 6  . Vitamin D, Ergocalciferol, (DRISDOL) 50000 units CAPS capsule Take 1  capsule (50,000 Units total) by mouth every 7 (seven) days. 4 capsule 0   No current facility-administered medications on file prior to visit.     PAST MEDICAL HISTORY: Past Medical History:  Diagnosis Date  . Anxiety   . Back pain   . Chest pain   . Depression   . Diabetes mellitus   . Diarrhea   . Edema    feet and legs  . Gallbladder problem   . 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 GERD (gastroesophageal reflux disease)   . Heartburn   . HTN (hypertension)   . Hyperlipidemia   . Obesity   . Palpitations   . PMDD (premenstrual dysphoric disorder) 01/02/2016  . Shortness of breath   . Vitamin D deficiency   . Vitamin D deficiency     PAST SURGICAL HISTORY: Past  Surgical History:  Procedure Laterality Date  . APPENDECTOMY    . CHOLECYSTECTOMY    . EUS N/A 03/23/2014   Procedure: ESOPHAGEAL ENDOSCOPIC ULTRASOUND (EUS) RADIAL;  Surgeon: Arta Silence, MD;  Location: [facility];  Service: Endoscopy;  Laterality: N/A;  . FINE NEEDLE ASPIRATION N/A 03/23/2014   Procedure: FINE NEEDLE ASPIRATION (FNA) LINEAR;  Surgeon: Arta Silence, MD;  Location: [facility];  Service: Endoscopy;  Laterality: N/A;  . TONSILLECTOMY      SOCIAL HISTORY: Social History  Substance Use Topics  . Smoking status: Never Smoker  . Smokeless tobacco: Never Used  . Alcohol use No    FAMILY HISTORY: Family History  Problem Relation Age of Onset  . Healthy Mother   . Hypertension Mother   . Hyperlipidemia Mother   . Alcoholism Mother   . Anxiety disorder Mother   . Drug abuse Mother   . Healthy Father   . Colon cancer Paternal Grandfather 22    colon    ROS: Review of Systems  Constitutional: Positive for weight loss.  Cardiovascular: Negative for chest pain and claudication.  Musculoskeletal: Negative for myalgias.  Endo/Heme/Allergies:       Negative hypoglycemia    PHYSICAL EXAM: Blood pressure 124/75, pulse 76, temperature 98.4 F (36.9 C), temperature source Oral, height 5\' 5"  (1.651 m), weight 247 lb (112 kg), SpO2 100 %. Body mass index is 41.1 kg/m. Physical Exam  Constitutional: She is oriented to person, place, and time. She appears well-developed and well-nourished.  Cardiovascular: Normal rate.   Pulmonary/Chest: Effort normal.  Musculoskeletal: Normal range of motion.  Neurological: She is oriented to person, place, and time.  Skin: Skin is warm and dry.  Psychiatric: She has a normal mood and affect. Her behavior is normal.  Vitals reviewed.   RECENT LABS AND TESTS: BMET    Component Value Date/Time   NA 142 01/02/2016 1503   K 3.7 01/02/2016 1503   CL 100 01/02/2016 1503   CO2 25 01/02/2016 1503   GLUCOSE 116 (H) 01/02/2016  1503   GLUCOSE 99 09/26/2014 2012   BUN 9 01/02/2016 1503   CREATININE 0.56 (L) 01/02/2016 1503   CREATININE 0.65 05/05/2014 0902   CALCIUM 9.3 01/02/2016 1503   GFRNONAA 115 01/02/2016 1503   GFRNONAA >89 05/05/2014 0902   GFRAA 132 01/02/2016 1503   GFRAA >89 05/05/2014 0902   Lab Results  Component Value Date   HGBA1C 6.3 (H) 01/02/2016   HGBA1C WILL FOLLOW 01/02/2016   HGBA1C 6.3 (H) 08/24/2015   HGBA1C 6 02/14/2015   HGBA1C 6.1 (H) 05/05/2014   Lab Results  Component  Value Date   INSULIN 15.3 01/02/2016   INSULIN WILL FOLLOW 01/02/2016   CBC    Component Value Date/Time   WBC 9.4 01/02/2016 1503   WBC 10.7 (H) 09/26/2014 2012   RBC 4.36 01/02/2016 1503   RBC 4.53 09/26/2014 2012   HGB 13.6 09/26/2014 2012   HCT 39.0 01/02/2016 1503   PLT 323 09/26/2014 2012   MCV 89 01/02/2016 1503   MCH 30.0 01/02/2016 1503   MCH 30.0 09/26/2014 2012   MCHC 33.6 01/02/2016 1503   MCHC 34.0 09/26/2014 2012   RDW 12.6 01/02/2016 1503   LYMPHSABS 2.6 01/02/2016 1503   MONOABS 0.6 05/05/2014 0902   EOSABS 0.1 01/02/2016 1503   BASOSABS 0.0 01/02/2016 1503   Iron/TIBC/Ferritin/ %Sat No results found for: IRON, TIBC, FERRITIN, IRONPCTSAT Lipid Panel     Component Value Date/Time   CHOL 188 01/02/2016 1503   TRIG 89 01/02/2016 1503   HDL 52 01/02/2016 1503   CHOLHDL 3.4 05/05/2014 0902   VLDL 16 05/05/2014 0902   LDLCALC 118 (H) 01/02/2016 1503   Hepatic Function Panel     Component Value Date/Time   PROT 7.4 01/02/2016 1503   ALBUMIN 4.4 01/02/2016 1503   AST 16 01/02/2016 1503   ALT 18 01/02/2016 1503   ALKPHOS 112 01/02/2016 1503   BILITOT 0.4 01/02/2016 1503      Component Value Date/Time   TSH 2.940 01/02/2016 1503   TSH 0.019 (L) 01/02/2016 1449   TSH 2.053 05/05/2014 0902    ASSESSMENT AND PLAN: Hyperlipidemia, unspecified hyperlipidemia type - Plan: atorvastatin (LIPITOR) 10 MG tablet  Type 2 diabetes mellitus without complication, without long-term  current use of insulin (HCC)  Class 3 obesity with serious comorbidity and body mass index (BMI) of 40.0 to 44.9 in adult, unspecified obesity type ([place])  PLAN:  Hyperlipidemia Aridai was informed of the American Heart Association Guidelines emphasizing intensive lifestyle modifications as the first line treatment for hyperlipidemia. We discussed many lifestyle modifications today in depth, and Kippi will continue to work on decreasing saturated fats such as fatty red meat, butter and many fried foods. She will also increase vegetables and lean protein in her diet and continue to work on exercise and weight loss efforts. We will refill Lipitor for 1 month and will re-check labs in 2 months.  Diabetes II Janeva has been given extensive diabetes education by myself today including ideal fasting and post-prandial blood glucose readings, individual ideal Hgb A1c goals  and hypoglycemia prevention. We discussed the importance of good blood sugar control to decrease the likelihood of diabetic complications such as nephropathy, neuropathy, limb loss, blindness, coronary artery disease, and death. We discussed the importance of intensive lifestyle modification including diet, exercise and weight loss as the first line treatment for diabetes. Vesta agrees to continue her diabetes medications and will follow up at the agreed upon time.  Obesity Nyonna is currently in the action stage of change. As such, her goal is to continue with weight loss efforts She has agreed to keep a food journal with 1500 calories and 75+ grams of protein daily Ninfa has been instructed to work up to a goal of 150 minutes of combined cardio and strengthening exercise per week for weight loss and overall health benefits. We discussed the following Behavioral Modification Stratagies today: increasing lean protein intake, increasing lower sugar fruits and emotional eating strategies  Sinthia has agreed to follow up  with our clinic in 2 weeks. She was informed of the  importance of frequent follow up visits to maximize her success with intensive lifestyle modifications for her multiple health conditions.  I, Doreene Nest, am acting as scribe for Dennard Nip, MD  I have reviewed the above documentation for accuracy and completeness, and I agree with the above. -Dennard Nip, MD

## 2016-03-12 DIAGNOSIS — F411 Generalized anxiety disorder: Secondary | ICD-10-CM | POA: Diagnosis not present

## 2016-03-18 ENCOUNTER — Ambulatory Visit (INDEPENDENT_AMBULATORY_CARE_PROVIDER_SITE_OTHER): Payer: 59 | Admitting: Family Medicine

## 2016-03-18 VITALS — BP 126/65 | HR 78 | Temp 98.7°F | Ht 65.0 in | Wt 248.0 lb

## 2016-03-18 DIAGNOSIS — F3289 Other specified depressive episodes: Secondary | ICD-10-CM | POA: Diagnosis not present

## 2016-03-18 DIAGNOSIS — Z6841 Body Mass Index (BMI) 40.0 and over, adult: Secondary | ICD-10-CM

## 2016-03-18 DIAGNOSIS — E669 Obesity, unspecified: Secondary | ICD-10-CM

## 2016-03-18 DIAGNOSIS — IMO0001 Reserved for inherently not codable concepts without codable children: Secondary | ICD-10-CM | POA: Insufficient documentation

## 2016-03-18 DIAGNOSIS — E119 Type 2 diabetes mellitus without complications: Secondary | ICD-10-CM

## 2016-03-18 MED ORDER — BUPROPION HCL ER (SR) 200 MG PO TB12: 200.0000 mg | ORAL_TABLET | Freq: Every day | ORAL | 0 refills | Status: DC

## 2016-03-18 MED ORDER — LIRAGLUTIDE 18 MG/3ML ~~LOC~~ SOPN: 1.2000 mg | PEN_INJECTOR | SUBCUTANEOUS | 0 refills | Status: DC

## 2016-03-18 NOTE — Progress Notes (Signed)
Office: [phone]  /  Fax: [phone]   HPI:   Chief Complaint: OBESITY Jasmine Martinez is here to discuss her progress with her obesity treatment plan. She is following her eating plan approximately 95 % of the time and states she is walking for exercise 20 minutes 4 times per week. Jasmine Martinez went out of town and increased eating out. She still notes significant family sabotage from her husband and is working on Biomedical engineer eating. She is doing well journaling even if she eats poorly.  Her weight is 248 lb (112.5 kg) today and has had a weight gain of 1 lb over a period of 1 weeks since her last visit. She has lost 12 lbs since starting treatment with [place].  Other Depression Jasmine Martinez mood is stable on Wellbutrin, she feels more in control of her emotional eating.  Diabetes II Jasmine Martinez has a diagnosis of diabetes type II. Jasmine Martinez started on Victoza and denies nausea, vomiting or any hypoglycemic episodes. Last A1c was at 6.3 She has been working on intensive lifestyle modifications including diet, exercise, and weight loss to help control her blood glucose levels.  Wt Readings from Last 500 Encounters:  03/18/16 248 lb (112.5 kg)  03/11/16 247 lb (112 kg)  03/04/16 249 lb (112.9 kg)  02/26/16 249 lb (112.9 kg)  02/19/16 253 lb (114.8 kg)  02/05/16 253 lb (114.8 kg)  01/29/16 243 lb (110.2 kg)  01/24/16 252 lb (114.3 kg)  01/10/16 256 lb (116.1 kg)  01/02/16 260 lb 9.8 oz (118.2 kg)  04/26/15 252 lb (114.3 kg)  02/14/15 258 lb 3.2 oz (117.1 kg)  02/14/15 258 lb 3.2 oz (117.1 kg)  11/22/14 265 lb (120.2 kg)  11/15/14 270 lb 8 oz (122.7 kg)  11/01/14 260 lb (117.9 kg)  10/03/14 274 lb 6.4 oz (124.5 kg)  09/26/14 270 lb (122.5 kg)  06/23/14 267 lb 9.6 oz (121.4 kg)  05/06/14 258 lb (117 kg)  12/13/13 245 lb (111.1 kg)  11/15/13 244 lb 9.6 oz (110.9 kg)  09/24/12 270 lb (122.5 kg)  08/21/12 271 lb (122.9 kg)  08/06/12 268 lb (121.6 kg)  06/13/12 268 lb (121.6 kg)    05/27/12 266 lb (120.7 kg)  02/21/12 259 lb 8 oz (117.7 kg)  11/16/11 259 lb 9.6 oz (117.8 kg)  01/15/11 257 lb 4 oz (116.7 kg)     ALLERGIES: Allergies  Allergen Reactions  . Codeine Shortness Of Breath  . Hydrocodone Shortness Of Breath    Mild SOB    MEDICATIONS: Current Outpatient Prescriptions on File Prior to Visit  Medication Sig Dispense Refill  . atorvastatin (LIPITOR) 10 MG tablet Take 1 tablet (10 mg total) by mouth daily. 30 tablet 0  . levonorgestrel (MIRENA) 20 MCG/24HR IUD 1 each by Intrauterine route once.    Marland Kitchen NOVOFINE 32G X 6 MM MISC Inject 1 Stick as directed daily. 90 each 5  . TRUE METRIX BLOOD GLUCOSE TEST test strip Apply 1 strip topically daily.  3  . TRUEPLUS LANCETS 30G MISC Inject 1 Stick as directed daily. 100 each 6  . Vitamin D, Ergocalciferol, (DRISDOL) 50000 units CAPS capsule Take 1 capsule (50,000 Units total) by mouth every 7 (seven) days. 4 capsule 0   No current facility-administered medications on file prior to visit.     PAST MEDICAL HISTORY: Past Medical History:  Diagnosis Date  . Anxiety   . Back pain   . Chest pain   . Depression   . Diabetes mellitus   .  Diarrhea   . Edema    feet and legs  . Gallbladder problem   . 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 GERD (gastroesophageal reflux disease)   . Heartburn   . HTN (hypertension)   . Hyperlipidemia   . Obesity   . Palpitations   . PMDD (premenstrual dysphoric disorder) 01/02/2016  . Shortness of breath   . Vitamin D deficiency   . Vitamin D deficiency     PAST SURGICAL HISTORY: Past Surgical History:  Procedure Laterality Date  . APPENDECTOMY    . CHOLECYSTECTOMY    . EUS N/A 03/23/2014   Procedure: ESOPHAGEAL ENDOSCOPIC ULTRASOUND (EUS) RADIAL;  Surgeon: Arta Silence, MD;  Location: [facility];  Service: Endoscopy;  Laterality: N/A;  . FINE NEEDLE ASPIRATION N/A 03/23/2014   Procedure: FINE NEEDLE ASPIRATION (FNA) LINEAR;  Surgeon:  Arta Silence, MD;  Location: [facility];  Service: Endoscopy;  Laterality: N/A;  . TONSILLECTOMY      SOCIAL HISTORY: Social History  Substance Use Topics  . Smoking status: Never Smoker  . Smokeless tobacco: Never Used  . Alcohol use No    FAMILY HISTORY: Family History  Problem Relation Age of Onset  . Healthy Mother   . Hypertension Mother   . Hyperlipidemia Mother   . Alcoholism Mother   . Anxiety disorder Mother   . Drug abuse Mother   . Healthy Father   . Colon cancer Paternal Grandfather 33    colon    ROS: Review of Systems  Constitutional: Negative for weight loss.  Gastrointestinal: Negative for nausea and vomiting.  Endo/Heme/Allergies:       Negative Hypoglycemia  Psychiatric/Behavioral: Positive for depression.    PHYSICAL EXAM: Blood pressure 126/65, pulse 78, temperature 98.7 F (37.1 C), temperature source Oral, height 5\' 5"  (1.651 m), weight 248 lb (112.5 kg), SpO2 100 %. Body mass index is 41.27 kg/m. Physical Exam  Constitutional: She is oriented to person, place, and time. She appears well-developed and well-nourished.  Cardiovascular: Normal rate.   Pulmonary/Chest: Effort normal.  Musculoskeletal: Normal range of motion.  Neurological: She is oriented to person, place, and time.  Skin: Skin is warm and dry.  Vitals reviewed.   RECENT LABS AND TESTS: BMET    Component Value Date/Time   NA 142 01/02/2016 1503   K 3.7 01/02/2016 1503   CL 100 01/02/2016 1503   CO2 25 01/02/2016 1503   GLUCOSE 116 (H) 01/02/2016 1503   GLUCOSE 99 09/26/2014 2012   BUN 9 01/02/2016 1503   CREATININE 0.56 (L) 01/02/2016 1503   CREATININE 0.65 05/05/2014 0902   CALCIUM 9.3 01/02/2016 1503   GFRNONAA 115 01/02/2016 1503   GFRNONAA >89 05/05/2014 0902   GFRAA 132 01/02/2016 1503   GFRAA >89 05/05/2014 0902   Lab Results  Component Value Date   HGBA1C 6.3 (H) 01/02/2016   HGBA1C WILL FOLLOW 01/02/2016   HGBA1C 6.3 (H) 08/24/2015   HGBA1C 6  02/14/2015   HGBA1C 6.1 (H) 05/05/2014   Lab Results  Component Value Date   INSULIN 15.3 01/02/2016   INSULIN WILL FOLLOW 01/02/2016   CBC    Component Value Date/Time   WBC 9.4 01/02/2016 1503   WBC 10.7 (H) 09/26/2014 2012   RBC 4.36 01/02/2016 1503   RBC 4.53 09/26/2014 2012   HGB 13.6 09/26/2014 2012   HCT 39.0 01/02/2016 1503   PLT 323 09/26/2014 2012   MCV 89 01/02/2016 1503   MCH 30.0 01/02/2016  [place] 30.0 09/26/2014 2012   MCHC 33.6 01/02/2016 1503   MCHC 34.0 09/26/2014 2012   RDW 12.6 01/02/2016 1503   LYMPHSABS 2.6 01/02/2016 1503   MONOABS 0.6 05/05/2014 0902   EOSABS 0.1 01/02/2016 1503   BASOSABS 0.0 01/02/2016 1503   Iron/TIBC/Ferritin/ %Sat No results found for: IRON, TIBC, FERRITIN, IRONPCTSAT Lipid Panel     Component Value Date/Time   CHOL 188 01/02/2016 1503   TRIG 89 01/02/2016 1503   HDL 52 01/02/2016 1503   CHOLHDL 3.4 05/05/2014 0902   VLDL 16 05/05/2014 0902   LDLCALC 118 (H) 01/02/2016 1503   Hepatic Function Panel     Component Value Date/Time   PROT 7.4 01/02/2016 1503   ALBUMIN 4.4 01/02/2016 1503   AST 16 01/02/2016 1503   ALT 18 01/02/2016 1503   ALKPHOS 112 01/02/2016 1503   BILITOT 0.4 01/02/2016 1503      Component Value Date/Time   TSH 2.940 01/02/2016 1503   TSH 0.019 (L) 01/02/2016 1449   TSH 2.053 05/05/2014 0902    ASSESSMENT AND PLAN: Type 2 diabetes mellitus without complication, without long-term current use of insulin (HCC) - Plan: liraglutide (VICTOZA) 18 MG/3ML SOPN  Other depression - Plan: buPROPion (WELLBUTRIN SR) 200 MG 12 hr tablet  Class 3 obesity without serious comorbidity with body mass index (BMI) of 40.0 to 44.9 in adult, unspecified obesity type ([place])  PLAN:  Depression with Emotional Eating Behaviors We discussed behavior modification techniques today to help Jasmine Martinez deal with her emotional eating and depression. Jasmine Martinez agrees to continue to take Wellbutrin SR 200 mg every morning  #30, we will refill for 1 month.  Diabetes II Jasmine Martinez has been given extensive diabetes education by myself today including ideal fasting and post-prandial blood glucose readings, individual ideal Hgb A1c goals  and hypoglycemia prevention. We discussed the importance of good blood sugar control to decrease the likelihood of diabetic complications such as nephropathy, neuropathy, limb loss, blindness, coronary artery disease, and death. We discussed the importance of intensive lifestyle modification including diet, exercise and weight loss as the first line treatment for diabetes. Yavette agrees to continue her diabetes medications and will follow up at the agreed upon time. We will refill Victoza 1.8 mg every morning #3 pen box.  Obesity Jasmine Martinez is currently in the action stage of change. As such, her goal is to continue with weight loss efforts She has agreed to keep a food journal with 1300 to 1600 calories and 80+ grams of protein daily Jasmine Martinez has been instructed to work up to a goal of 150 minutes of combined cardio and strengthening exercise per week for weight loss and overall health benefits. We discussed the following Behavioral Modification Stratagies today: increasing lean protein intake and emotional eating strategies  Jasmine Martinez has agreed to follow up with our clinic in 1 to 2 weeks. She was informed of the importance of frequent follow up visits to maximize her success with intensive lifestyle modifications for her multiple health conditions.  I, Doreene Nest, am acting as scribe for Dennard Nip, MD  I have reviewed the above documentation for accuracy and completeness, and I agree with the above. -Dennard Nip, MD

## 2016-03-19 DIAGNOSIS — F411 Generalized anxiety disorder: Secondary | ICD-10-CM | POA: Diagnosis not present

## 2016-03-23 ENCOUNTER — Telehealth: Payer: 59 | Admitting: Nurse Practitioner

## 2016-03-23 DIAGNOSIS — N3000 Acute cystitis without hematuria: Secondary | ICD-10-CM

## 2016-03-23 MED ORDER — NITROFURANTOIN MONOHYD MACRO 100 MG PO CAPS: 100.0000 mg | ORAL_CAPSULE | Freq: Two times a day (BID) | ORAL | 0 refills | Status: DC

## 2016-03-23 NOTE — Progress Notes (Signed)

## 2016-03-26 ENCOUNTER — Ambulatory Visit (INDEPENDENT_AMBULATORY_CARE_PROVIDER_SITE_OTHER): Payer: 59 | Admitting: Family Medicine

## 2016-03-27 ENCOUNTER — Ambulatory Visit (INDEPENDENT_AMBULATORY_CARE_PROVIDER_SITE_OTHER): Payer: 59 | Admitting: Family Medicine

## 2016-03-27 VITALS — BP 120/77 | HR 61 | Temp 98.7°F | Ht 65.0 in | Wt 248.0 lb

## 2016-03-27 DIAGNOSIS — E119 Type 2 diabetes mellitus without complications: Secondary | ICD-10-CM

## 2016-03-27 DIAGNOSIS — F411 Generalized anxiety disorder: Secondary | ICD-10-CM | POA: Diagnosis not present

## 2016-03-27 NOTE — Progress Notes (Signed)
Office: [phone]  /  Fax: [phone]   HPI:   Chief Complaint: OBESITY Jasmine Martinez is here to discuss her progress with her obesity treatment plan. She is following her eating plan approximately 75 % of the time and states she is exercising 15 to 20 minutes 3 times per week. Jasmine Martinez has done well maintaining weight, she had some celebration eating in the last week but did well, portion control/smart choices. Her weight is 248 lb (112.5 kg) today and has maintained weight over a period of 1 to 2 weeks since her last visit. She has lost 12 lbs since starting treatment with [place].  Diabetes II Jasmine Martinez has a diagnosis of diabetes type II. Last A1c was at 6.3 She is on Victoza, notes some GI upset with increased fast food. She has been working on intensive lifestyle modifications including diet, exercise, and weight loss to help control her blood glucose levels.  Wt Readings from Last 500 Encounters:  03/27/16 248 lb (112.5 kg)  03/18/16 248 lb (112.5 kg)  03/11/16 247 lb (112 kg)  03/04/16 249 lb (112.9 kg)  02/26/16 249 lb (112.9 kg)  02/19/16 253 lb (114.8 kg)  02/05/16 253 lb (114.8 kg)  01/29/16 243 lb (110.2 kg)  01/24/16 252 lb (114.3 kg)  01/10/16 256 lb (116.1 kg)  01/02/16 260 lb 9.8 oz (118.2 kg)  04/26/15 252 lb (114.3 kg)  02/14/15 258 lb 3.2 oz (117.1 kg)  02/14/15 258 lb 3.2 oz (117.1 kg)  11/22/14 265 lb (120.2 kg)  11/15/14 270 lb 8 oz (122.7 kg)  11/01/14 260 lb (117.9 kg)  10/03/14 274 lb 6.4 oz (124.5 kg)  09/26/14 270 lb (122.5 kg)  06/23/14 267 lb 9.6 oz (121.4 kg)  05/06/14 258 lb (117 kg)  12/13/13 245 lb (111.1 kg)  11/15/13 244 lb 9.6 oz (110.9 kg)  09/24/12 270 lb (122.5 kg)  08/21/12 271 lb (122.9 kg)  08/06/12 268 lb (121.6 kg)  06/13/12 268 lb (121.6 kg)  05/27/12 266 lb (120.7 kg)  02/21/12 259 lb 8 oz (117.7 kg)  11/16/11 259 lb 9.6 oz (117.8 kg)  01/15/11 257 lb 4 oz (116.7 kg)     ALLERGIES: Allergies  Allergen Reactions  . Codeine  Shortness Of Breath  . Hydrocodone Shortness Of Breath    Mild SOB    MEDICATIONS: Current Outpatient Prescriptions on File Prior to Visit  Medication Sig Dispense Refill  . atorvastatin (LIPITOR) 10 MG tablet Take 1 tablet (10 mg total) by mouth daily. 30 tablet 0  . buPROPion (WELLBUTRIN SR) 200 MG 12 hr tablet Take 1 tablet (200 mg total) by mouth daily. 30 tablet 0  . levonorgestrel (MIRENA) 20 MCG/24HR IUD 1 each by Intrauterine route once.    . liraglutide (VICTOZA) 18 MG/3ML SOPN Inject 0.2 mLs (1.2 mg total) into the skin every morning. 3 pen 0  . nitrofurantoin, macrocrystal-monohydrate, (MACROBID) 100 MG capsule Take 1 capsule (100 mg total) by mouth 2 (two) times daily. 1 po BId 14 capsule 0  . NOVOFINE 32G X 6 MM MISC Inject 1 Stick as directed daily. 90 each 5  . TRUE METRIX BLOOD GLUCOSE TEST test strip Apply 1 strip topically daily.  3  . TRUEPLUS LANCETS 30G MISC Inject 1 Stick as directed daily. 100 each 6  . Vitamin D, Ergocalciferol, (DRISDOL) 50000 units CAPS capsule Take 1 capsule (50,000 Units total) by mouth every 7 (seven) days. 4 capsule 0   No current facility-administered medications on file prior to  visit.     PAST MEDICAL HISTORY: Past Medical History:  Diagnosis Date  . Anxiety   . Back pain   . Chest pain   . Depression   . Diabetes mellitus   . Diarrhea   . Edema    feet and legs  . Gallbladder problem   . 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 GERD (gastroesophageal reflux disease)   . Heartburn   . HTN (hypertension)   . Hyperlipidemia   . Obesity   . Palpitations   . PMDD (premenstrual dysphoric disorder) 01/02/2016  . Shortness of breath   . Vitamin D deficiency   . Vitamin D deficiency     PAST SURGICAL HISTORY: Past Surgical History:  Procedure Laterality Date  . APPENDECTOMY    . CHOLECYSTECTOMY    . EUS N/A 03/23/2014   Procedure: ESOPHAGEAL ENDOSCOPIC ULTRASOUND (EUS) RADIAL;  Surgeon: Arta Silence,  MD;  Location: [facility];  Service: Endoscopy;  Laterality: N/A;  . FINE NEEDLE ASPIRATION N/A 03/23/2014   Procedure: FINE NEEDLE ASPIRATION (FNA) LINEAR;  Surgeon: Arta Silence, MD;  Location: [facility];  Service: Endoscopy;  Laterality: N/A;  . TONSILLECTOMY      SOCIAL HISTORY: Social History  Substance Use Topics  . Smoking status: Never Smoker  . Smokeless tobacco: Never Used  . Alcohol use No    FAMILY HISTORY: Family History  Problem Relation Age of Onset  . Healthy Mother   . Hypertension Mother   . Hyperlipidemia Mother   . Alcoholism Mother   . Anxiety disorder Mother   . Drug abuse Mother   . Healthy Father   . Colon cancer Paternal Grandfather 30    colon    ROS: Review of Systems  Constitutional: Negative for weight loss.  Gastrointestinal: Positive for diarrhea and nausea.    PHYSICAL EXAM: Blood pressure 120/77, pulse 61, temperature 98.7 F (37.1 C), temperature source Oral, height 5\' 5"  (1.651 m), weight 248 lb (112.5 kg), SpO2 96 %. Body mass index is 41.27 kg/m. Physical Exam  Constitutional: She is oriented to person, place, and time. She appears well-developed and well-nourished.  Cardiovascular: Normal rate.   Pulmonary/Chest: Effort normal.  Musculoskeletal: Normal range of motion.  Neurological: She is oriented to person, place, and time.  Skin: Skin is warm and dry.  Psychiatric: She has a normal mood and affect. Her behavior is normal.  Vitals reviewed.   RECENT LABS AND TESTS: BMET    Component Value Date/Time   NA 142 01/02/2016 1503   K 3.7 01/02/2016 1503   CL 100 01/02/2016 1503   CO2 25 01/02/2016 1503   GLUCOSE 116 (H) 01/02/2016 1503   GLUCOSE 99 09/26/2014 2012   BUN 9 01/02/2016 1503   CREATININE 0.56 (L) 01/02/2016 1503   CREATININE 0.65 05/05/2014 0902   CALCIUM 9.3 01/02/2016 1503   GFRNONAA 115 01/02/2016 1503   GFRNONAA >89 05/05/2014 0902   GFRAA 132 01/02/2016 1503   GFRAA >89 05/05/2014 0902   Lab  Results  Component Value Date   HGBA1C 6.3 (H) 01/02/2016   HGBA1C WILL FOLLOW 01/02/2016   HGBA1C 6.3 (H) 08/24/2015   HGBA1C 6 02/14/2015   HGBA1C 6.1 (H) 05/05/2014   Lab Results  Component Value Date   INSULIN 15.3 01/02/2016   INSULIN WILL FOLLOW 01/02/2016   CBC    Component Value Date/Time   WBC 9.4 01/02/2016 1503   WBC 10.7 (H) 09/26/2014 2012   RBC 4.36  01/02/2016 1503   RBC 4.53 09/26/2014 2012   HGB 13.6 09/26/2014 2012   HCT 39.0 01/02/2016 1503   PLT 323 09/26/2014 2012   MCV 89 01/02/2016 1503   MCH 30.0 01/02/2016 1503   MCH 30.0 09/26/2014 2012   MCHC 33.6 01/02/2016 1503   MCHC 34.0 09/26/2014 2012   RDW 12.6 01/02/2016 1503   LYMPHSABS 2.6 01/02/2016 1503   MONOABS 0.6 05/05/2014 0902   EOSABS 0.1 01/02/2016 1503   BASOSABS 0.0 01/02/2016 1503   Iron/TIBC/Ferritin/ %Sat No results found for: IRON, TIBC, FERRITIN, IRONPCTSAT Lipid Panel     Component Value Date/Time   CHOL 188 01/02/2016 1503   TRIG 89 01/02/2016 1503   HDL 52 01/02/2016 1503   CHOLHDL 3.4 05/05/2014 0902   VLDL 16 05/05/2014 0902   LDLCALC 118 (H) 01/02/2016 1503   Hepatic Function Panel     Component Value Date/Time   PROT 7.4 01/02/2016 1503   ALBUMIN 4.4 01/02/2016 1503   AST 16 01/02/2016 1503   ALT 18 01/02/2016 1503   ALKPHOS 112 01/02/2016 1503   BILITOT 0.4 01/02/2016 1503      Component Value Date/Time   TSH 2.940 01/02/2016 1503   TSH 0.019 (L) 01/02/2016 1449   TSH 2.053 05/05/2014 0902    ASSESSMENT AND PLAN: Type 2 diabetes mellitus without complication, without long-term current use of insulin ([place])  Morbid obesity ([place])  PLAN:  Diabetes II Jasmine Martinez has been given extensive diabetes education by myself today including ideal fasting and post-prandial blood glucose readings, individual ideal Hgb A1c goals  and hypoglycemia prevention. We discussed the importance of good blood sugar control to decrease the likelihood of diabetic complications such as  nephropathy, neuropathy, limb loss, blindness, coronary artery disease, and death. We discussed the importance of intensive lifestyle modification including diet, exercise and weight loss as the first line treatment for diabetes. Jasmine Martinez agrees to continue Victoza and will follow up at the agreed upon time.  Obesity Jasmine Martinez is currently in the action stage of change. As such, her goal is to continue with weight loss efforts She has agreed to get back to strict journaling. She agrees to keep a food journal with 1300 to 1600 calories and 80+ grams of protein daily. Jasmine Martinez has been instructed to work up to a goal of 150 minutes of combined cardio and strengthening exercise per week for weight loss and overall health benefits. We discussed the following Behavioral Modification Stratagies today: increasing lean protein intake and increasing lower sugar fruits  Jasmine Martinez has agreed to follow up with our clinic in 1 week. She was informed of the importance of frequent follow up visits to maximize her success with intensive lifestyle modifications for her multiple health conditions.  I, Doreene Nest, am acting as scribe for Dennard Nip, MD  I have reviewed the above documentation for accuracy and completeness, and I agree with the above. -Dennard Nip, MD

## 2016-04-01 MED FILL — VIT D2 1.25 MG (50,000 UNIT: 1.25 MG | 28 days supply | Qty: 4 | Fill #0

## 2016-04-01 MED FILL — VICTOZA 2-PAK 18 MG/3 ML PE: 18 | 30 days supply | Qty: 6 | Fill #0

## 2016-04-01 MED FILL — ATORVASTATIN 10 MG TABLET: 10 | 30 days supply | Qty: 30 | Fill #0

## 2016-04-01 MED FILL — NOVOFINE 32G NEEDLES: 32G X 6 MM | 90 days supply | Qty: 100 | Fill #1

## 2016-04-01 MED FILL — BUPROPION HCL SR 200 MG TAB: 200 | 30 days supply | Qty: 30 | Fill #0

## 2016-04-02 DIAGNOSIS — F411 Generalized anxiety disorder: Secondary | ICD-10-CM | POA: Diagnosis not present

## 2016-04-04 ENCOUNTER — Ambulatory Visit (INDEPENDENT_AMBULATORY_CARE_PROVIDER_SITE_OTHER): Payer: 59 | Admitting: Family Medicine

## 2016-04-04 VITALS — BP 113/76 | HR 83 | Temp 97.7°F | Ht 65.0 in | Wt 245.0 lb

## 2016-04-04 DIAGNOSIS — E785 Hyperlipidemia, unspecified: Secondary | ICD-10-CM | POA: Diagnosis not present

## 2016-04-04 DIAGNOSIS — E559 Vitamin D deficiency, unspecified: Secondary | ICD-10-CM

## 2016-04-04 MED ORDER — VITAMIN D (ERGOCALCIFEROL) 1.25 MG (50000 UNIT) PO CAPS: 50000.0000 [IU] | ORAL_CAPSULE | ORAL | 0 refills | Status: DC

## 2016-04-04 MED ORDER — ATORVASTATIN CALCIUM 10 MG PO TABS: 10.0000 mg | ORAL_TABLET | Freq: Every day | ORAL | 0 refills | Status: DC

## 2016-04-04 NOTE — Progress Notes (Signed)
Office: [phone]  /  Fax: [phone]   HPI:   Chief Complaint: OBESITY Jasmine Martinez is here to discuss her progress with her obesity treatment plan. She is following her eating plan approximately 100 % of the time and states she is exercising 15 minutes 3 times per week. Jasmine Martinez continues to do well with weight loss but still struggles with significant husband sabotage. She is working on increasing protein. She has co-workers who will be starting our program and is worried that they will discourage her if they lose weight quickly, which might make her feel as if she isn't doing as well as she should be. Her weight is 245 lb (111.1 kg) today and has had a weight loss of 3 pounds over a period of 1 week since her last visit. She has lost 15 lbs since starting treatment with [place].  Vitamin D deficiency Jasmine Martinez has a diagnosis of vitamin D deficiency. She is currently stable on vitamin D. Fatigue has improved and denies nausea, vomiting or muscle weakness.  Hyperlipidemia Jasmine Martinez has hyperlipidemia and has been trying to improve her cholesterol levels with intensive lifestyle modification including a low saturated fat diet, exercise and weight loss. She denies any chest pain, claudication or myalgias.  Wt Readings from Last 500 Encounters:  04/04/16 245 lb (111.1 kg)  03/27/16 248 lb (112.5 kg)  03/18/16 248 lb (112.5 kg)  03/11/16 247 lb (112 kg)  03/04/16 249 lb (112.9 kg)  02/26/16 249 lb (112.9 kg)  02/19/16 253 lb (114.8 kg)  02/05/16 253 lb (114.8 kg)  01/29/16 243 lb (110.2 kg)  01/24/16 252 lb (114.3 kg)  01/10/16 256 lb (116.1 kg)  01/02/16 260 lb 9.8 oz (118.2 kg)  04/26/15 252 lb (114.3 kg)  02/14/15 258 lb 3.2 oz (117.1 kg)  02/14/15 258 lb 3.2 oz (117.1 kg)  11/22/14 265 lb (120.2 kg)  11/15/14 270 lb 8 oz (122.7 kg)  11/01/14 260 lb (117.9 kg)  10/03/14 274 lb 6.4 oz (124.5 kg)  09/26/14 270 lb (122.5 kg)  06/23/14 267 lb 9.6 oz (121.4 kg)  05/06/14 258 lb (117  kg)  12/13/13 245 lb (111.1 kg)  11/15/13 244 lb 9.6 oz (110.9 kg)  09/24/12 270 lb (122.5 kg)  08/21/12 271 lb (122.9 kg)  08/06/12 268 lb (121.6 kg)  06/13/12 268 lb (121.6 kg)  05/27/12 266 lb (120.7 kg)  02/21/12 259 lb 8 oz (117.7 kg)  11/16/11 259 lb 9.6 oz (117.8 kg)  01/15/11 257 lb 4 oz (116.7 kg)     ALLERGIES: Allergies  Allergen Reactions  . Codeine Shortness Of Breath  . Hydrocodone Shortness Of Breath    Mild SOB    MEDICATIONS: Current Outpatient Prescriptions on File Prior to Visit  Medication Sig Dispense Refill  . buPROPion (WELLBUTRIN SR) 200 MG 12 hr tablet Take 1 tablet (200 mg total) by mouth daily. 30 tablet 0  . levonorgestrel (MIRENA) 20 MCG/24HR IUD 1 each by Intrauterine route once.    . liraglutide (VICTOZA) 18 MG/3ML SOPN Inject 0.2 mLs (1.2 mg total) into the skin every morning. 3 pen 0  . NOVOFINE 32G X 6 MM MISC Inject 1 Stick as directed daily. 90 each 5  . TRUE METRIX BLOOD GLUCOSE TEST test strip Apply 1 strip topically daily.  3  . TRUEPLUS LANCETS 30G MISC Inject 1 Stick as directed daily. 100 each 6   No current facility-administered medications on file prior to visit.     PAST MEDICAL HISTORY: Past  Medical History:  Diagnosis Date  . Anxiety   . Back pain   . Chest pain   . Depression   . Diabetes mellitus   . Diarrhea   . Edema    feet and legs  . Gallbladder problem   . 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 GERD (gastroesophageal reflux disease)   . Heartburn   . HTN (hypertension)   . Hyperlipidemia   . Obesity   . Palpitations   . PMDD (premenstrual dysphoric disorder) 01/02/2016  . Shortness of breath   . Vitamin D deficiency   . Vitamin D deficiency     PAST SURGICAL HISTORY: Past Surgical History:  Procedure Laterality Date  . APPENDECTOMY    . CHOLECYSTECTOMY    . EUS N/A 03/23/2014   Procedure: ESOPHAGEAL ENDOSCOPIC ULTRASOUND (EUS) RADIAL;  Surgeon: Arta Silence, MD;  Location:  [facility];  Service: Endoscopy;  Laterality: N/A;  . FINE NEEDLE ASPIRATION N/A 03/23/2014   Procedure: FINE NEEDLE ASPIRATION (FNA) LINEAR;  Surgeon: Arta Silence, MD;  Location: [facility];  Service: Endoscopy;  Laterality: N/A;  . TONSILLECTOMY      SOCIAL HISTORY: Social History  Substance Use Topics  . Smoking status: Never Smoker  . Smokeless tobacco: Never Used  . Alcohol use No    FAMILY HISTORY: Family History  Problem Relation Age of Onset  . Healthy Mother   . Hypertension Mother   . Hyperlipidemia Mother   . Alcoholism Mother   . Anxiety disorder Mother   . Drug abuse Mother   . Healthy Father   . Colon cancer Paternal Grandfather 6    colon    ROS: Review of Systems  Constitutional: Positive for malaise/fatigue and weight loss.  Cardiovascular: Negative for chest pain and claudication.  Gastrointestinal: Negative for nausea and vomiting.  Musculoskeletal: Negative for myalgias.       Negative muscle weakness    PHYSICAL EXAM: Blood pressure 113/76, pulse 83, temperature 97.7 F (36.5 C), temperature source Oral, height 5\' 5"  (1.651 m), weight 245 lb (111.1 kg), SpO2 100 %. Body mass index is 40.77 kg/m. Physical Exam  Constitutional: She is oriented to person, place, and time. She appears well-developed and well-nourished.  Cardiovascular: Normal rate.   Pulmonary/Chest: Effort normal.  Musculoskeletal: Normal range of motion.  Neurological: She is oriented to person, place, and time.  Skin: Skin is warm and dry.  Psychiatric: She has a normal mood and affect. Her behavior is normal.  Vitals reviewed.   RECENT LABS AND TESTS: BMET    Component Value Date/Time   NA 142 01/02/2016 1503   K 3.7 01/02/2016 1503   CL 100 01/02/2016 1503   CO2 25 01/02/2016 1503   GLUCOSE 116 (H) 01/02/2016 1503   GLUCOSE 99 09/26/2014 2012   BUN 9 01/02/2016 1503   CREATININE 0.56 (L) 01/02/2016 1503   CREATININE 0.65 05/05/2014 0902   CALCIUM 9.3  01/02/2016 1503   GFRNONAA 115 01/02/2016 1503   GFRNONAA >89 05/05/2014 0902   GFRAA 132 01/02/2016 1503   GFRAA >89 05/05/2014 0902   Lab Results  Component Value Date   HGBA1C 6.3 (H) 01/02/2016   HGBA1C WILL FOLLOW 01/02/2016   HGBA1C 6.3 (H) 08/24/2015   HGBA1C 6 02/14/2015   HGBA1C 6.1 (H) 05/05/2014   Lab Results  Component Value Date   INSULIN 15.3 01/02/2016   INSULIN WILL FOLLOW 01/02/2016   CBC    Component Value Date/Time  WBC 9.4 01/02/2016 1503   WBC 10.7 (H) 09/26/2014 2012   RBC 4.36 01/02/2016 1503   RBC 4.53 09/26/2014 2012   HGB 13.6 09/26/2014 2012   HCT 39.0 01/02/2016 1503   PLT 323 09/26/2014 2012   MCV 89 01/02/2016 1503   MCH 30.0 01/02/2016 1503   MCH 30.0 09/26/2014 2012   MCHC 33.6 01/02/2016 1503   MCHC 34.0 09/26/2014 2012   RDW 12.6 01/02/2016 1503   LYMPHSABS 2.6 01/02/2016 1503   MONOABS 0.6 05/05/2014 0902   EOSABS 0.1 01/02/2016 1503   BASOSABS 0.0 01/02/2016 1503   Iron/TIBC/Ferritin/ %Sat No results found for: IRON, TIBC, FERRITIN, IRONPCTSAT Lipid Panel     Component Value Date/Time   CHOL 188 01/02/2016 1503   TRIG 89 01/02/2016 1503   HDL 52 01/02/2016 1503   CHOLHDL 3.4 05/05/2014 0902   VLDL 16 05/05/2014 0902   LDLCALC 118 (H) 01/02/2016 1503   Hepatic Function Panel     Component Value Date/Time   PROT 7.4 01/02/2016 1503   ALBUMIN 4.4 01/02/2016 1503   AST 16 01/02/2016 1503   ALT 18 01/02/2016 1503   ALKPHOS 112 01/02/2016 1503   BILITOT 0.4 01/02/2016 1503      Component Value Date/Time   TSH 2.940 01/02/2016 1503   TSH 0.019 (L) 01/02/2016 1449   TSH 2.053 05/05/2014 0902    ASSESSMENT AND PLAN: Hyperlipidemia, unspecified hyperlipidemia type - Plan: atorvastatin (LIPITOR) 10 MG tablet  Vitamin D deficiency - Plan: Vitamin D, Ergocalciferol, (DRISDOL) 50000 units CAPS capsule  Morbid obesity ([place])  PLAN:  Vitamin D Deficiency Jasmine Martinez was informed that low vitamin D levels contributes to  fatigue and are associated with obesity, breast, and colon cancer. She agrees to continue to take prescription Vit D @50 ,000 IU every week, we will refill for 1 month and will check labs at next visit. She will follow up for routine testing of vitamin D, at least 2-3 times per year. She was informed of the risk of over-replacement of vitamin D and agrees to not increase her dose unless he discusses this with [place] first. Jasmine Martinez agrees to follow up with our clinic in 1 week. Hyperlipidemia Jasmine Martinez was informed of the American Heart Association Guidelines emphasizing intensive lifestyle modifications as the first line treatment for hyperlipidemia. We discussed many lifestyle modifications today in depth, and Jasmine Martinez will continue to work on decreasing saturated fats such as fatty red meat, butter and many fried foods. She will also increase vegetables and lean protein in her diet and continue to work on exercise and weight loss efforts. She agrees to continue to take Lipitor, we will refill for 1 month and check labs at next visit and she will follow up with our clinic in 1 week.  Obesity Jasmine Martinez is currently in the action stage of change. As such, her goal is to continue with weight loss efforts She has agreed to keep a food journal with 1300 to 1600 calories and 80+ grams of protein daily Jasmine Martinez has been instructed to work up to a goal of 150 minutes of combined cardio and strengthening exercise per week for weight loss and overall health benefits. We discussed the following Behavioral Modification Strategies today: increasing lean protein intake, decreasing simple carbohydrates , dealing with family or coworker sabotage and emotional eating strategies  Jasmine Martinez has agreed to follow up with our clinic in 1 week. She was informed of the importance of frequent follow up visits to maximize her success with intensive lifestyle  modifications for her multiple health conditions.  I, Doreene Nest, am  acting as scribe for Dennard Nip, MD  I have reviewed the above documentation for accuracy and completeness, and I agree with the above. -Dennard Nip, MD

## 2016-04-09 DIAGNOSIS — F411 Generalized anxiety disorder: Secondary | ICD-10-CM | POA: Diagnosis not present

## 2016-04-16 DIAGNOSIS — F411 Generalized anxiety disorder: Secondary | ICD-10-CM | POA: Diagnosis not present

## 2016-04-18 ENCOUNTER — Other Ambulatory Visit (INDEPENDENT_AMBULATORY_CARE_PROVIDER_SITE_OTHER): Payer: Self-pay | Admitting: Family Medicine

## 2016-04-18 ENCOUNTER — Encounter (INDEPENDENT_AMBULATORY_CARE_PROVIDER_SITE_OTHER): Payer: Self-pay | Admitting: Family Medicine

## 2016-04-18 ENCOUNTER — Ambulatory Visit (INDEPENDENT_AMBULATORY_CARE_PROVIDER_SITE_OTHER): Payer: 59 | Admitting: Family Medicine

## 2016-04-18 VITALS — BP 109/77 | HR 79 | Temp 98.3°F | Ht 65.0 in | Wt 243.0 lb

## 2016-04-18 DIAGNOSIS — F3289 Other specified depressive episodes: Secondary | ICD-10-CM

## 2016-04-18 DIAGNOSIS — E119 Type 2 diabetes mellitus without complications: Secondary | ICD-10-CM | POA: Diagnosis not present

## 2016-04-18 MED ORDER — BUPROPION HCL ER (SR) 200 MG PO TB12: 200.0000 mg | ORAL_TABLET | Freq: Every day | ORAL | 0 refills | Status: DC

## 2016-04-18 NOTE — Progress Notes (Signed)
Office: [phone]  /  Fax: [phone]   HPI:   Chief Complaint: OBESITY Jasmine Martinez is here to discuss her progress with her obesity treatment plan. She is following her eating plan approximately 90 % of the time and states she is walking 15 minutes 3 times per week. Jasmine Martinez continues to do well with weight loss, even with [employer]. She is doing well planning meals ahead of time and doing well increasing her lean protein. She is doing well journaling even when she eats poorly. Her weight is 243 lb (110.2 kg) today and has had a weight loss of 2 pounds over a period of 2 weeks since her last visit. She has lost 17 lbs since starting treatment with [place].  Diabetes II non insulin Jasmine Martinez has a diagnosis of diabetes type II. Jasmine Martinez states fasting blood sugars are running between 100 to 130 and denies any hypoglycemic episodes. Last A1c was at 6.3 She is on Victoza 1.2 mg and denies nausea or vomiting. She is doing very well on diet prescription. She has been working on intensive lifestyle modifications including diet, exercise, and weight loss to help control her blood glucose levels.  Depression with emotional eating behaviors Jasmine Martinez is struggling with emotional eating and using food for comfort to the extent that it is negatively impacting her health. She often snacks when she is not hungry. Jasmine Martinez sometimes feels she is out of control and then feels guilty that she made poor food choices. Her mood is improved on Wellbutrin, dealing with her family and better dealing with work stress more productively. Blood pressure is stable, no insomnia. She has been working on behavior modification techniques to help reduce her emotional eating and has been somewhat successful. She shows no sign of suicidal or homicidal ideations.  Depression screen [facility] 2/9 01/02/2016 02/14/2015 06/23/2014  Decreased Interest 1 0 1  Down, Depressed, Hopeless 1 0 1  PHQ - 2 Score 2 0 2  Altered sleeping  2 - 1  Tired, decreased energy 3 - 1  Change in appetite 3 - 0  Feeling bad or failure about yourself  3 - 0  Trouble concentrating 0 - 0  Moving slowly or fidgety/restless 0 - 0  Suicidal thoughts 0 - 0  PHQ-9 Score 13 - 4  Difficult doing work/chores - - Not difficult at all     Wt Readings from Last 500 Encounters:  04/18/16 243 lb (110.2 kg)  04/04/16 245 lb (111.1 kg)  03/27/16 248 lb (112.5 kg)  03/18/16 248 lb (112.5 kg)  03/11/16 247 lb (112 kg)  03/04/16 249 lb (112.9 kg)  02/26/16 249 lb (112.9 kg)  02/19/16 253 lb (114.8 kg)  02/05/16 253 lb (114.8 kg)  01/29/16 243 lb (110.2 kg)  01/24/16 252 lb (114.3 kg)  01/10/16 256 lb (116.1 kg)  01/02/16 260 lb 9.8 oz (118.2 kg)  04/26/15 252 lb (114.3 kg)  02/14/15 258 lb 3.2 oz (117.1 kg)  02/14/15 258 lb 3.2 oz (117.1 kg)  11/22/14 265 lb (120.2 kg)  11/15/14 270 lb 8 oz (122.7 kg)  11/01/14 260 lb (117.9 kg)  10/03/14 274 lb 6.4 oz (124.5 kg)  09/26/14 270 lb (122.5 kg)  06/23/14 267 lb 9.6 oz (121.4 kg)  05/06/14 258 lb (117 kg)  12/13/13 245 lb (111.1 kg)  11/15/13 244 lb 9.6 oz (110.9 kg)  09/24/12 270 lb (122.5 kg)  08/21/12 271 lb (122.9 kg)  08/06/12 268 lb (121.6 kg)  06/13/12 268 lb (121.6  kg)  05/27/12 266 lb (120.7 kg)  02/21/12 259 lb 8 oz (117.7 kg)  11/16/11 259 lb 9.6 oz (117.8 kg)  01/15/11 257 lb 4 oz (116.7 kg)     ALLERGIES: Allergies  Allergen Reactions  . Codeine Shortness Of Breath  . Hydrocodone Shortness Of Breath    Mild SOB    MEDICATIONS: Current Outpatient Prescriptions on File Prior to Visit  Medication Sig Dispense Refill  . atorvastatin (LIPITOR) 10 MG tablet Take 1 tablet (10 mg total) by mouth daily. 30 tablet 0  . levonorgestrel (MIRENA) 20 MCG/24HR IUD 1 each by Intrauterine route once.    . liraglutide (VICTOZA) 18 MG/3ML SOPN Inject 0.2 mLs (1.2 mg total) into the skin every morning. 3 pen 0  . NOVOFINE 32G X 6 MM MISC Inject 1 Stick as directed daily. 90 each 5    . TRUE METRIX BLOOD GLUCOSE TEST test strip Apply 1 strip topically daily.  3  . TRUEPLUS LANCETS 30G MISC Inject 1 Stick as directed daily. 100 each 6  . Vitamin D, Ergocalciferol, (DRISDOL) 50000 units CAPS capsule Take 1 capsule (50,000 Units total) by mouth every 7 (seven) days. 4 capsule 0   No current facility-administered medications on file prior to visit.     PAST MEDICAL HISTORY: Past Medical History:  Diagnosis Date  . Anxiety   . Back pain   . Chest pain   . Depression   . Diabetes mellitus   . Diarrhea   . Edema    feet and legs  . Gallbladder problem   . 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 GERD (gastroesophageal reflux disease)   . Heartburn   . HTN (hypertension)   . Hyperlipidemia   . Obesity   . Palpitations   . PMDD (premenstrual dysphoric disorder) 01/02/2016  . Shortness of breath   . Vitamin D deficiency   . Vitamin D deficiency     PAST SURGICAL HISTORY: Past Surgical History:  Procedure Laterality Date  . APPENDECTOMY    . CHOLECYSTECTOMY    . EUS N/A 03/23/2014   Procedure: ESOPHAGEAL ENDOSCOPIC ULTRASOUND (EUS) RADIAL;  Surgeon: Arta Silence, MD;  Location: [facility];  Service: Endoscopy;  Laterality: N/A;  . FINE NEEDLE ASPIRATION N/A 03/23/2014   Procedure: FINE NEEDLE ASPIRATION (FNA) LINEAR;  Surgeon: Arta Silence, MD;  Location: [facility];  Service: Endoscopy;  Laterality: N/A;  . TONSILLECTOMY      SOCIAL HISTORY: Social History  Substance Use Topics  . Smoking status: Never Smoker  . Smokeless tobacco: Never Used  . Alcohol use No    FAMILY HISTORY: Family History  Problem Relation Age of Onset  . Healthy Mother   . Hypertension Mother   . Hyperlipidemia Mother   . Alcoholism Mother   . Anxiety disorder Mother   . Drug abuse Mother   . Healthy Father   . Colon cancer Paternal Grandfather 97    colon    ROS: Review of Systems  Constitutional: Positive for weight loss.   Gastrointestinal: Negative for nausea and vomiting.  Endo/Heme/Allergies:       Negative hypoglycemia  Psychiatric/Behavioral: Negative for suicidal ideas. The patient does not have insomnia.     PHYSICAL EXAM: Blood pressure 109/77, pulse 79, temperature 98.3 F (36.8 C), temperature source Oral, height 5\' 5"  (1.651 m), weight 243 lb (110.2 kg), SpO2 100 %. Body mass index is 40.44 kg/m. Physical Exam  Constitutional: She is oriented to person, place,  and time. She appears well-developed and well-nourished.  Cardiovascular: Normal rate.   Pulmonary/Chest: Effort normal.  Musculoskeletal: Normal range of motion.  Neurological: She is oriented to person, place, and time.  Skin: Skin is warm and dry.  Psychiatric: She has a normal mood and affect. Her behavior is normal.  Vitals reviewed.   RECENT LABS AND TESTS: BMET    Component Value Date/Time   NA 142 01/02/2016 1503   K 3.7 01/02/2016 1503   CL 100 01/02/2016 1503   CO2 25 01/02/2016 1503   GLUCOSE 116 (H) 01/02/2016 1503   GLUCOSE 99 09/26/2014 2012   BUN 9 01/02/2016 1503   CREATININE 0.56 (L) 01/02/2016 1503   CREATININE 0.65 05/05/2014 0902   CALCIUM 9.3 01/02/2016 1503   GFRNONAA 115 01/02/2016 1503   GFRNONAA >89 05/05/2014 0902   GFRAA 132 01/02/2016 1503   GFRAA >89 05/05/2014 0902   Lab Results  Component Value Date   HGBA1C 6.3 (H) 01/02/2016   HGBA1C WILL FOLLOW 01/02/2016   HGBA1C 6.3 (H) 08/24/2015   HGBA1C 6 02/14/2015   HGBA1C 6.1 (H) 05/05/2014   Lab Results  Component Value Date   INSULIN 15.3 01/02/2016   INSULIN WILL FOLLOW 01/02/2016   CBC    Component Value Date/Time   WBC 9.4 01/02/2016 1503   WBC 10.7 (H) 09/26/2014 2012   RBC 4.36 01/02/2016 1503   RBC 4.53 09/26/2014 2012   HGB 13.6 09/26/2014 2012   HCT 39.0 01/02/2016 1503   PLT 323 09/26/2014 2012   MCV 89 01/02/2016 1503   MCH 30.0 01/02/2016 1503   MCH 30.0 09/26/2014 2012   MCHC 33.6 01/02/2016 1503   MCHC 34.0  09/26/2014 2012   RDW 12.6 01/02/2016 1503   LYMPHSABS 2.6 01/02/2016 1503   MONOABS 0.6 05/05/2014 0902   EOSABS 0.1 01/02/2016 1503   BASOSABS 0.0 01/02/2016 1503   Iron/TIBC/Ferritin/ %Sat No results found for: IRON, TIBC, FERRITIN, IRONPCTSAT Lipid Panel     Component Value Date/Time   CHOL 188 01/02/2016 1503   TRIG 89 01/02/2016 1503   HDL 52 01/02/2016 1503   CHOLHDL 3.4 05/05/2014 0902   VLDL 16 05/05/2014 0902   LDLCALC 118 (H) 01/02/2016 1503   Hepatic Function Panel     Component Value Date/Time   PROT 7.4 01/02/2016 1503   ALBUMIN 4.4 01/02/2016 1503   AST 16 01/02/2016 1503   ALT 18 01/02/2016 1503   ALKPHOS 112 01/02/2016 1503   BILITOT 0.4 01/02/2016 1503      Component Value Date/Time   TSH 2.940 01/02/2016 1503   TSH 0.019 (L) 01/02/2016 1449   TSH 2.053 05/05/2014 0902    ASSESSMENT AND PLAN: Type 2 diabetes mellitus without complication, without long-term current use of insulin ([place]) - Plan: Comprehensive metabolic panel, CBC with Differential/Platelet, Hemoglobin A1c, Insulin, random, Lipid Panel With LDL/HDL Ratio, VITAMIN D 25 Hydroxy (Vit-D Deficiency, Fractures), Vitamin B12, Folate, TSH, T4, free, T3, Microalbumin / creatinine urine ratio  Other depression - Plan: buPROPion (WELLBUTRIN SR) 200 MG 12 hr tablet  Morbid obesity ([place])  PLAN:  Diabetes II non insulin Jasmine Martinez has been given extensive diabetes education by myself today including ideal fasting and post-prandial blood glucose readings, individual ideal Hgb A1c goals  and hypoglycemia prevention. We discussed the importance of good blood sugar control to decrease the likelihood of diabetic complications such as nephropathy, neuropathy, limb loss, blindness, coronary artery disease, and death. We discussed the importance of intensive lifestyle modification including diet,  exercise and weight loss as the first line treatment for diabetes. Sumaiya agrees to continue Victoza 1.2 mg qd and  will follow up at the agreed upon time.  Depression with Emotional Eating Behaviors We discussed behavior modification techniques today to help Jasmine Martinez deal with her emotional eating and depression. She has agreed to take Wellbutrin SR 150 mg qd, we will refill for 1 month and she agreed to follow up as directed.  Obesity Jasmine Martinez is currently in the action stage of change. As such, her goal is to continue with weight loss efforts She has agreed to keep a food journal with 1300 to 1600 calories and 80+ grams of protein daily Jasmine Martinez has been instructed to work up to a goal of 150 minutes of combined cardio and strengthening exercise per week for weight loss and overall health benefits. We discussed the following Behavioral Modification Stratagies today: increasing lean protein intake, decrease eating out, dealing with family or coworker sabotage, ways to avoid boredom eating and avoiding temptations  Jasmine Martinez has agreed to follow up with our clinic in 2 weeks. She was informed of the importance of frequent follow up visits to maximize her success with intensive lifestyle modifications for her multiple health conditions.  I, Doreene Nest, am acting as scribe for Dennard Nip, MD  I have reviewed the above documentation for accuracy and completeness, and I agree with the above. -Dennard Nip, MD

## 2016-04-19 LAB — CBC WITH DIFFERENTIAL/PLATELET
BASOS: 0 %
Basophils Absolute: 0 10*3/uL (ref 0.0–0.2)
EOS (ABSOLUTE): 0.1 10*3/uL (ref 0.0–0.4)
Eos: 1 %
HEMOGLOBIN: 13.7 g/dL (ref 11.1–15.9)
Hematocrit: 40.8 % (ref 34.0–46.6)
IMMATURE GRANS (ABS): 0 10*3/uL (ref 0.0–0.1)
Immature Granulocytes: 0 %
Lymphocytes Absolute: 2.4 10*3/uL (ref 0.7–3.1)
Lymphs: 26 %
MCH: 30.4 pg (ref 26.6–33.0)
MCHC: 33.6 g/dL (ref 31.5–35.7)
MCV: 91 fL (ref 79–97)
Monocytes Absolute: 0.5 10*3/uL (ref 0.1–0.9)
Monocytes: 5 %
NEUTROS ABS: 6.3 10*3/uL (ref 1.4–7.0)
NEUTROS PCT: 68 %
Platelets: 392 10*3/uL — ABNORMAL HIGH (ref 150–379)
RBC: 4.5 x10E6/uL (ref 3.77–5.28)
RDW: 12.8 % (ref 12.3–15.4)
WBC: 9.2 10*3/uL (ref 3.4–10.8)

## 2016-04-19 LAB — T3: T3 TOTAL: 145 ng/dL (ref 71–180)

## 2016-04-19 LAB — MICROALBUMIN / CREATININE URINE RATIO
CREATININE, UR: 224.1 mg/dL
MICROALB/CREAT RATIO: 12.1 mg/g{creat} (ref 0.0–30.0)
Microalbumin, Urine: 27.2 ug/mL

## 2016-04-19 LAB — LIPID PANEL WITH LDL/HDL RATIO
CHOLESTEROL TOTAL: 140 mg/dL (ref 100–199)
HDL: 46 mg/dL (ref >39)
LDL Calculated: 76 mg/dL (ref 0–99)
LDl/HDL Ratio: 1.7 ratio (ref 0.0–3.2)
TRIGLYCERIDES: 92 mg/dL (ref 0–149)
VLDL Cholesterol Cal: 18 mg/dL (ref 5–40)

## 2016-04-19 LAB — COMPREHENSIVE METABOLIC PANEL
ALT: 25 IU/L (ref 0–32)
AST: 17 IU/L (ref 0–40)
Albumin/Globulin Ratio: 1.4 (ref 1.2–2.2)
Albumin: 4.5 g/dL (ref 3.5–5.5)
Alkaline Phosphatase: 116 IU/L (ref 39–117)
BILIRUBIN TOTAL: 0.5 mg/dL (ref 0.0–1.2)
BUN/Creatinine Ratio: 10 (ref 9–23)
BUN: 7 mg/dL (ref 6–24)
CHLORIDE: 101 mmol/L (ref 96–106)
CO2: 23 mmol/L (ref 18–29)
CREATININE: 0.72 mg/dL (ref 0.57–1.00)
Calcium: 9.7 mg/dL (ref 8.7–10.2)
GFR calc Af Amer: 119 mL/min/{1.73_m2} (ref >59)
GFR calc non Af Amer: 103 mL/min/{1.73_m2} (ref >59)
GLOBULIN, TOTAL: 3.3 g/dL (ref 1.5–4.5)
GLUCOSE: 100 mg/dL — AB (ref 65–99)
Potassium: 4.3 mmol/L (ref 3.5–5.2)
SODIUM: 140 mmol/L (ref 134–144)
Total Protein: 7.8 g/dL (ref 6.0–8.5)

## 2016-04-19 LAB — INSULIN, RANDOM: INSULIN: 21.8 u[IU]/mL (ref 2.6–24.9)

## 2016-04-19 LAB — HEMOGLOBIN A1C
Est. average glucose Bld gHb Est-mCnc: 111 mg/dL
Hgb A1c MFr Bld: 5.5 % (ref 4.8–5.6)

## 2016-04-19 LAB — T4, FREE: Free T4: 1.37 ng/dL (ref 0.82–1.77)

## 2016-04-19 LAB — VITAMIN D 25 HYDROXY (VIT D DEFICIENCY, FRACTURES): Vit D, 25-Hydroxy: 37.1 ng/mL (ref 30.0–100.0)

## 2016-04-19 LAB — TSH: TSH: 2.09 u[IU]/mL (ref 0.450–4.500)

## 2016-04-19 LAB — FOLATE: FOLATE: 15.4 ng/mL (ref >3.0)

## 2016-04-19 LAB — VITAMIN B12: Vitamin B-12: 406 pg/mL (ref 232–1245)

## 2016-04-23 ENCOUNTER — Encounter: Payer: Self-pay | Admitting: Internal Medicine

## 2016-04-23 ENCOUNTER — Ambulatory Visit (INDEPENDENT_AMBULATORY_CARE_PROVIDER_SITE_OTHER): Payer: 59 | Admitting: Internal Medicine

## 2016-04-23 VITALS — BP 142/90 | HR 89 | Temp 98.1°F | Wt 250.0 lb

## 2016-04-23 DIAGNOSIS — R300 Dysuria: Secondary | ICD-10-CM

## 2016-04-23 DIAGNOSIS — E119 Type 2 diabetes mellitus without complications: Secondary | ICD-10-CM | POA: Diagnosis not present

## 2016-04-23 DIAGNOSIS — F411 Generalized anxiety disorder: Secondary | ICD-10-CM | POA: Diagnosis not present

## 2016-04-23 LAB — POCT URINALYSIS DIPSTICK
BILIRUBIN UA: NEGATIVE
Blood, UA: NEGATIVE
Glucose, UA: NEGATIVE
KETONES UA: NEGATIVE
Leukocytes, UA: NEGATIVE
Nitrite, UA: NEGATIVE
PROTEIN UA: NEGATIVE
Spec Grav, UA: 1.03 (ref 1.030–1.035)
Urobilinogen, UA: 0.2 (ref ?–2.0)
pH, UA: 6 (ref 5.0–8.0)

## 2016-04-23 MED ORDER — CIPROFLOXACIN HCL 500 MG PO TABS: 500.0000 mg | ORAL_TABLET | Freq: Two times a day (BID) | ORAL | 0 refills | Status: DC

## 2016-04-23 MED FILL — CIPROFLOXACIN HCL 500 MG TA: 500 | 7 days supply | Qty: 14 | Fill #0

## 2016-04-23 NOTE — Progress Notes (Signed)
   Subjective:    Patient ID: Jasmine Martinez, female    DOB: [birth year], 46 y.o.   MRN: [id number]  HPI 46 year old Female with diabetes had  E-visit recently for UTI symptoms. Was treated for 7 days with Macrobid. However symptoms have returned.  No fever or shaking chills. Just dysuria. No nausea or vomiting.    Review of Systems see above     Objective:   Physical Exam  No CVA tenderness. Urine dipstick is negative. Culture obtained      Assessment & Plan:  Urethritis versus UTI  Plan: Cipro 500 mg twice daily for 7 days. Culture is pending  Addendum: 04/26/2016-urine culture reveals no growth

## 2016-04-25 LAB — URINE CULTURE: Organism ID, Bacteria: NO GROWTH

## 2016-04-30 DIAGNOSIS — F411 Generalized anxiety disorder: Secondary | ICD-10-CM | POA: Diagnosis not present

## 2016-05-01 ENCOUNTER — Other Ambulatory Visit (INDEPENDENT_AMBULATORY_CARE_PROVIDER_SITE_OTHER): Payer: Self-pay | Admitting: Family Medicine

## 2016-05-01 ENCOUNTER — Ambulatory Visit (INDEPENDENT_AMBULATORY_CARE_PROVIDER_SITE_OTHER): Payer: 59 | Admitting: Family Medicine

## 2016-05-01 VITALS — BP 112/73 | HR 79 | Temp 98.5°F | Ht 65.0 in | Wt 243.0 lb

## 2016-05-01 DIAGNOSIS — E559 Vitamin D deficiency, unspecified: Secondary | ICD-10-CM | POA: Diagnosis not present

## 2016-05-01 DIAGNOSIS — G4709 Other insomnia: Secondary | ICD-10-CM | POA: Diagnosis not present

## 2016-05-01 DIAGNOSIS — E7849 Other hyperlipidemia: Secondary | ICD-10-CM

## 2016-05-01 DIAGNOSIS — E784 Other hyperlipidemia: Secondary | ICD-10-CM | POA: Diagnosis not present

## 2016-05-01 DIAGNOSIS — E119 Type 2 diabetes mellitus without complications: Secondary | ICD-10-CM

## 2016-05-01 DIAGNOSIS — Z9189 Other specified personal risk factors, not elsewhere classified: Secondary | ICD-10-CM

## 2016-05-01 MED ORDER — MELATONIN 10 MG PO TABS: 1.0000 | ORAL_TABLET | Freq: Every day | ORAL | 0 refills | Status: DC

## 2016-05-01 MED ORDER — ATORVASTATIN CALCIUM 10 MG PO TABS: 10.0000 mg | ORAL_TABLET | Freq: Every day | ORAL | 0 refills | Status: DC

## 2016-05-01 MED ORDER — VITAMIN D (ERGOCALCIFEROL) 1.25 MG (50000 UNIT) PO CAPS: 50000.0000 [IU] | ORAL_CAPSULE | ORAL | 0 refills | Status: DC

## 2016-05-01 MED FILL — BUPROPION HCL SR 200 MG TAB: 200 | 30 days supply | Qty: 30 | Fill #0

## 2016-05-01 MED FILL — ATORVASTATIN 10 MG TABLET: 10 | 30 days supply | Qty: 30 | Fill #0

## 2016-05-01 MED FILL — VIT D2 1.25 MG (50,000 UNIT: 1.25 MG | 28 days supply | Qty: 4 | Fill #0

## 2016-05-01 NOTE — Progress Notes (Signed)
Office: [phone]  /  Fax: [phone]   HPI:   Chief Complaint: OBESITY Jasmine Martinez is here to discuss her progress with her obesity treatment plan. She is following her eating plan approximately 100 % of the time and states she is exercising 15 to 20 minutes 3 times per week. Jasmine Martinez did well maintaining her weight, even with increased travelling and eating out. She is retaining some fluid today. She has increased walking, especially while travelling. Her weight is 243 lb (110.2 kg) today and has maintained weight over a period of 2 weeks since her last visit. She has lost 17 lbs since starting treatment with [place].  Vitamin D deficiency Jasmine Martinez has a diagnosis of vitamin D deficiency. She is currently taking prescription vit D and labs are slowly improving. She denies nausea, vomiting or muscle weakness.  Insomnia Jasmine Martinez notes poor sleep, especially in the last 2 months, wakes frequently.  Hyperlipidemia Jasmine Martinez has hyperlipidemia and has been trying to improve her cholesterol levels with intensive lifestyle modification including a low saturated fat diet, exercise and weight loss. She started Lipitor, LDL greatly improved on a low dose. She denies any chest pain, claudication or myalgias.  At risk for cardiovascular disease Jasmine Martinez is at a higher than average risk for cardiovascular disease due to obesity. She currently denies any chest pain.  Wt Readings from Last 500 Encounters:  05/01/16 243 lb (110.2 kg)  04/23/16 250 lb (113.4 kg)  04/18/16 243 lb (110.2 kg)  04/04/16 245 lb (111.1 kg)  03/27/16 248 lb (112.5 kg)  03/18/16 248 lb (112.5 kg)  03/11/16 247 lb (112 kg)  03/04/16 249 lb (112.9 kg)  02/26/16 249 lb (112.9 kg)  02/19/16 253 lb (114.8 kg)  02/05/16 253 lb (114.8 kg)  01/29/16 243 lb (110.2 kg)  01/24/16 252 lb (114.3 kg)  01/10/16 256 lb (116.1 kg)  01/02/16 260 lb 9.8 oz (118.2 kg)  04/26/15 252 lb (114.3 kg)  02/14/15 258 lb 3.2 oz (117.1 kg)    02/14/15 258 lb 3.2 oz (117.1 kg)  11/22/14 265 lb (120.2 kg)  11/15/14 270 lb 8 oz (122.7 kg)  11/01/14 260 lb (117.9 kg)  10/03/14 274 lb 6.4 oz (124.5 kg)  09/26/14 270 lb (122.5 kg)  06/23/14 267 lb 9.6 oz (121.4 kg)  05/06/14 258 lb (117 kg)  12/13/13 245 lb (111.1 kg)  11/15/13 244 lb 9.6 oz (110.9 kg)  09/24/12 270 lb (122.5 kg)  08/21/12 271 lb (122.9 kg)  08/06/12 268 lb (121.6 kg)  06/13/12 268 lb (121.6 kg)  05/27/12 266 lb (120.7 kg)  02/21/12 259 lb 8 oz (117.7 kg)  11/16/11 259 lb 9.6 oz (117.8 kg)  01/15/11 257 lb 4 oz (116.7 kg)     ALLERGIES: Allergies  Allergen Reactions  . Codeine Shortness Of Breath  . Hydrocodone Shortness Of Breath    Mild SOB    MEDICATIONS: Current Outpatient Prescriptions on File Prior to Visit  Medication Sig Dispense Refill  . buPROPion (WELLBUTRIN SR) 200 MG 12 hr tablet Take 1 tablet (200 mg total) by mouth daily. 30 tablet 0  . ciprofloxacin (CIPRO) 500 MG tablet Take 1 tablet (500 mg total) by mouth 2 (two) times daily. 14 tablet 0  . levonorgestrel (MIRENA) 20 MCG/24HR IUD 1 each by Intrauterine route once.    . liraglutide (VICTOZA) 18 MG/3ML SOPN Inject 0.2 mLs (1.2 mg total) into the skin every morning. 3 pen 0  . NOVOFINE 32G X 6 MM MISC Inject 1  Stick as directed daily. 90 each 5  . TRUE METRIX BLOOD GLUCOSE TEST test strip Apply 1 strip topically daily.  3  . TRUEPLUS LANCETS 30G MISC Inject 1 Stick as directed daily. 100 each 6   No current facility-administered medications on file prior to visit.     PAST MEDICAL HISTORY: Past Medical History:  Diagnosis Date  . Anxiety   . Back pain   . Chest pain   . Depression   . Diabetes mellitus   . Diarrhea   . Edema    feet and legs  . Gallbladder problem   . 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 GERD (gastroesophageal reflux disease)   . Heartburn   . HTN (hypertension)   . Hyperlipidemia   . Obesity   . Palpitations   . PMDD  (premenstrual dysphoric disorder) 01/02/2016  . Shortness of breath   . Vitamin D deficiency   . Vitamin D deficiency     PAST SURGICAL HISTORY: Past Surgical History:  Procedure Laterality Date  . APPENDECTOMY    . CHOLECYSTECTOMY    . EUS N/A 03/23/2014   Procedure: ESOPHAGEAL ENDOSCOPIC ULTRASOUND (EUS) RADIAL;  Surgeon: Arta Silence, MD;  Location: [facility];  Service: Endoscopy;  Laterality: N/A;  . FINE NEEDLE ASPIRATION N/A 03/23/2014   Procedure: FINE NEEDLE ASPIRATION (FNA) LINEAR;  Surgeon: Arta Silence, MD;  Location: [facility];  Service: Endoscopy;  Laterality: N/A;  . TONSILLECTOMY      SOCIAL HISTORY: Social History  Substance Use Topics  . Smoking status: Never Smoker  . Smokeless tobacco: Never Used  . Alcohol use No    FAMILY HISTORY: Family History  Problem Relation Age of Onset  . Healthy Mother   . Hypertension Mother   . Hyperlipidemia Mother   . Alcoholism Mother   . Anxiety disorder Mother   . Drug abuse Mother   . Healthy Father   . Colon cancer Paternal Grandfather 71    colon    ROS: Review of Systems  Constitutional: Negative for weight loss.  Cardiovascular: Negative for chest pain and claudication.  Gastrointestinal: Negative for nausea and vomiting.  Musculoskeletal: Negative for myalgias.       Negative muscle weakness  Psychiatric/Behavioral: The patient has insomnia.     PHYSICAL EXAM: Blood pressure 112/73, pulse 79, temperature 98.5 F (36.9 C), temperature source Oral, height 5\' 5"  (1.651 m), weight 243 lb (110.2 kg), SpO2 99 %. Body mass index is 40.44 kg/m. Physical Exam  Constitutional: She is oriented to person, place, and time. She appears well-developed and well-nourished.  Cardiovascular: Normal rate.   Pulmonary/Chest: Effort normal.  Musculoskeletal: Normal range of motion.  Neurological: She is oriented to person, place, and time.  Skin: Skin is warm and dry.  Psychiatric: She has a normal mood and  affect. Her behavior is normal.  Vitals reviewed.   RECENT LABS AND TESTS: BMET    Component Value Date/Time   NA 140 04/18/2016 1201   K 4.3 04/18/2016 1201   CL 101 04/18/2016 1201   CO2 23 04/18/2016 1201   GLUCOSE 100 (H) 04/18/2016 1201   GLUCOSE 99 09/26/2014 2012   BUN 7 04/18/2016 1201   CREATININE 0.72 04/18/2016 1201   CREATININE 0.65 05/05/2014 0902   CALCIUM 9.7 04/18/2016 1201   GFRNONAA 103 04/18/2016 1201   GFRNONAA >89 05/05/2014 0902   GFRAA 119 04/18/2016 1201   GFRAA >89 05/05/2014 0902   Lab Results  Component  Value Date   HGBA1C 5.5 04/18/2016   HGBA1C 6.3 (H) 01/02/2016   HGBA1C WILL FOLLOW 01/02/2016   HGBA1C 6.3 (H) 08/24/2015   HGBA1C 6 02/14/2015   Lab Results  Component Value Date   INSULIN 21.8 04/18/2016   INSULIN 15.3 01/02/2016   INSULIN WILL FOLLOW 01/02/2016   CBC    Component Value Date/Time   WBC 9.2 04/18/2016 1201   WBC 10.7 (H) 09/26/2014 2012   RBC 4.50 04/18/2016 1201   RBC 4.53 09/26/2014 2012   HGB 13.6 09/26/2014 2012   HCT 40.8 04/18/2016 1201   PLT 392 (H) 04/18/2016 1201   MCV 91 04/18/2016 1201   MCH 30.4 04/18/2016 1201   MCH 30.0 09/26/2014 2012   MCHC 33.6 04/18/2016 1201   MCHC 34.0 09/26/2014 2012   RDW 12.8 04/18/2016 1201   LYMPHSABS 2.4 04/18/2016 1201   MONOABS 0.6 05/05/2014 0902   EOSABS 0.1 04/18/2016 1201   BASOSABS 0.0 04/18/2016 1201   Iron/TIBC/Ferritin/ %Sat No results found for: IRON, TIBC, FERRITIN, IRONPCTSAT Lipid Panel     Component Value Date/Time   CHOL 140 04/18/2016 1201   TRIG 92 04/18/2016 1201   HDL 46 04/18/2016 1201   CHOLHDL 3.4 05/05/2014 0902   VLDL 16 05/05/2014 0902   LDLCALC 76 04/18/2016 1201   Hepatic Function Panel     Component Value Date/Time   PROT 7.8 04/18/2016 1201   ALBUMIN 4.5 04/18/2016 1201   AST 17 04/18/2016 1201   ALT 25 04/18/2016 1201   ALKPHOS 116 04/18/2016 1201   BILITOT 0.5 04/18/2016 1201      Component Value Date/Time   TSH 2.090  04/18/2016 1201   TSH 2.940 01/02/2016 1503   TSH 0.019 (L) 01/02/2016 1449    ASSESSMENT AND PLAN: Other insomnia - Plan: Melatonin 10 MG TABS  Vitamin D deficiency - Plan: Vitamin D, Ergocalciferol, (DRISDOL) 50000 units CAPS capsule  Other hyperlipidemia - Plan: atorvastatin (LIPITOR) 10 MG tablet  At risk for heart disease  Morbid obesity ([place])  PLAN:  Vitamin D Deficiency Jasmine Martinez was informed that low vitamin D levels contributes to fatigue and are associated with obesity, breast, and colon cancer. She agrees to continue to take prescription Vit D @50 ,000 IU every week, we will refill for 1 month and will re-check labs in 3 months and will follow up for routine testing of vitamin D, at least 2-3 times per year. She was informed of the risk of over-replacement of vitamin D and agrees to not increase her dose unless he discusses this with [place] first. Jasmine Martinez agrees to follow up with our clinic in 2 weeks.  Insomnia Jasmine Martinez agrees to start to take OTC Melatonin 10 mg daily at dinnertime and follow up with our clinic in 2 weeks.  Hyperlipidemia Jasmine Martinez was informed of the American Heart Association Guidelines emphasizing intensive lifestyle modifications as the first line treatment for hyperlipidemia. We discussed many lifestyle modifications today in depth, and Jasmine Martinez will continue to work on decreasing saturated fats such as fatty red meat, butter and many fried foods. She will also increase vegetables and lean protein in her diet and continue to work on exercise and weight loss efforts. Jasmine Martinez agrees to continue to take Lipitor, we will refill for 1 month and will re-check labs in 3 months. Jasmine Martinez agrees to follow up with our clinic in 1 week.  Cardiovascular risk counselling Jasmine Martinez was given extended (at least 15 minutes) coronary artery disease prevention counseling today. She is 46 y.o.  female and has risk factors for heart disease including obesity. We discussed  intensive lifestyle modifications today with an emphasis on specific weight loss instructions and strategies. Pt was also informed of the importance of increasing exercise and decreasing saturated fats to help prevent heart disease.  Obesity Jasmine Martinez is currently in the action stage of change. As such, her goal is to continue with weight loss efforts She has agreed to keep a food journal with 1300 to 1600 calories and 80 grams of protein daily [place] has been instructed to work up to a goal of 150 minutes of combined cardio and strengthening exercise or continue walking for 15 to 20 minutes 3 times per week for weight loss and overall health benefits. We discussed the following Behavioral Modification Stratagies today: increasing lean protein intake, emotional eating strategies and ways to avoid boredom eating  Jasmine Martinez has agreed to follow up with our clinic in 1 weeks. She was informed of the importance of frequent follow up visits to maximize her success with intensive lifestyle modifications for her multiple health conditions.  I, Doreene Nest, am acting as scribe for Dennard Nip, MD  I have reviewed the above documentation for accuracy and completeness, and I agree with the above. -Dennard Nip, MD

## 2016-05-02 MED FILL — GAVILYTE-N SOLUTION: 420 | 1 days supply | Qty: 4000 | Fill #0

## 2016-05-08 ENCOUNTER — Ambulatory Visit (INDEPENDENT_AMBULATORY_CARE_PROVIDER_SITE_OTHER): Payer: 59 | Admitting: Family Medicine

## 2016-05-08 VITALS — BP 114/80 | HR 79 | Temp 97.8°F | Ht 65.0 in | Wt 239.0 lb

## 2016-05-08 DIAGNOSIS — G4709 Other insomnia: Secondary | ICD-10-CM | POA: Diagnosis not present

## 2016-05-08 DIAGNOSIS — E669 Obesity, unspecified: Secondary | ICD-10-CM | POA: Diagnosis not present

## 2016-05-08 DIAGNOSIS — Z6839 Body mass index (BMI) 39.0-39.9, adult: Secondary | ICD-10-CM

## 2016-05-08 DIAGNOSIS — E119 Type 2 diabetes mellitus without complications: Secondary | ICD-10-CM

## 2016-05-08 MED ORDER — LIRAGLUTIDE 18 MG/3ML ~~LOC~~ SOPN
1.2000 mg | PEN_INJECTOR | SUBCUTANEOUS | 0 refills | Status: DC
Start: 2016-05-08 — End: 2016-06-20

## 2016-05-08 MED FILL — VICTOZA 18 MG/3 ML INJECT P: 18 | 45 days supply | Qty: 9 | Fill #0

## 2016-05-08 NOTE — Progress Notes (Signed)
Office: [phone]  /  Fax: [phone]   HPI:   Chief Complaint: OBESITY Jasmine Martinez is here to discuss her progress with her obesity treatment plan. She is following her eating plan approximately 100 % of the time and states she is walking 15 minutes 3 times per week. Jasmine Martinez is doing well with weight loss. She is doing better with journaling and increasing lean protein. Hunger is controlled. She is still struggling with family sabotage. Her weight is 239 lb (108.4 kg) today and has had a weight loss of 4 pounds over a period of 1 week since her last visit. She has lost 10 lbs since starting treatment with [place].  Diabetes II Jasmine Martinez has a diagnosis of diabetes type II. Jasmine Martinez is doing well on diet and medications, last A1c was 5.5 and she denies any hypoglycemic episodes. She has been working on intensive lifestyle modifications including diet, exercise, and weight loss to help control her blood glucose levels.  Insomnia Jasmine Martinez started Melatonin and is still struggling with insomnia but it has only been 1 week.  Wt Readings from Last 500 Encounters:  05/08/16 239 lb (108.4 kg)  05/01/16 243 lb (110.2 kg)  04/23/16 250 lb (113.4 kg)  04/18/16 243 lb (110.2 kg)  04/04/16 245 lb (111.1 kg)  03/27/16 248 lb (112.5 kg)  03/18/16 248 lb (112.5 kg)  03/11/16 247 lb (112 kg)  03/04/16 249 lb (112.9 kg)  02/26/16 249 lb (112.9 kg)  02/19/16 253 lb (114.8 kg)  02/05/16 253 lb (114.8 kg)  01/29/16 243 lb (110.2 kg)  01/24/16 252 lb (114.3 kg)  01/10/16 256 lb (116.1 kg)  01/02/16 260 lb 9.8 oz (118.2 kg)  04/26/15 252 lb (114.3 kg)  02/14/15 258 lb 3.2 oz (117.1 kg)  02/14/15 258 lb 3.2 oz (117.1 kg)  11/22/14 265 lb (120.2 kg)  11/15/14 270 lb 8 oz (122.7 kg)  11/01/14 260 lb (117.9 kg)  10/03/14 274 lb 6.4 oz (124.5 kg)  09/26/14 270 lb (122.5 kg)  06/23/14 267 lb 9.6 oz (121.4 kg)  05/06/14 258 lb (117 kg)  12/13/13 245 lb (111.1 kg)  11/15/13 244 lb 9.6 oz (110.9 kg)    09/24/12 270 lb (122.5 kg)  08/21/12 271 lb (122.9 kg)  08/06/12 268 lb (121.6 kg)  06/13/12 268 lb (121.6 kg)  05/27/12 266 lb (120.7 kg)  02/21/12 259 lb 8 oz (117.7 kg)  11/16/11 259 lb 9.6 oz (117.8 kg)  01/15/11 257 lb 4 oz (116.7 kg)     ALLERGIES: Allergies  Allergen Reactions  . Codeine Shortness Of Breath  . Hydrocodone Shortness Of Breath    Mild SOB    MEDICATIONS: Current Outpatient Prescriptions on File Prior to Visit  Medication Sig Dispense Refill  . atorvastatin (LIPITOR) 10 MG tablet Take 1 tablet (10 mg total) by mouth daily. 30 tablet 0  . buPROPion (WELLBUTRIN SR) 200 MG 12 hr tablet Take 1 tablet (200 mg total) by mouth daily. 30 tablet 0  . ciprofloxacin (CIPRO) 500 MG tablet Take 1 tablet (500 mg total) by mouth 2 (two) times daily. 14 tablet 0  . levonorgestrel (MIRENA) 20 MCG/24HR IUD 1 each by Intrauterine route once.    . Melatonin 10 MG TABS Take 1 tablet by mouth at bedtime. 30 tablet 0  . NOVOFINE 32G X 6 MM MISC Inject 1 Stick as directed daily. 90 each 5  . TRUE METRIX BLOOD GLUCOSE TEST test strip Apply 1 strip topically daily.  3  .  TRUEPLUS LANCETS 30G MISC Inject 1 Stick as directed daily. 100 each 6  . Vitamin D, Ergocalciferol, (DRISDOL) 50000 units CAPS capsule Take 1 capsule (50,000 Units total) by mouth every 7 (seven) days. 4 capsule 0   No current facility-administered medications on file prior to visit.     PAST MEDICAL HISTORY: Past Medical History:  Diagnosis Date  . Anxiety   . Back pain   . Chest pain   . Depression   . Diabetes mellitus   . Diarrhea   . Edema    feet and legs  . Gallbladder problem   . 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 GERD (gastroesophageal reflux disease)   . Heartburn   . HTN (hypertension)   . Hyperlipidemia   . Obesity   . Palpitations   . PMDD (premenstrual dysphoric disorder) 01/02/2016  . Shortness of breath   . Vitamin D deficiency   . Vitamin D  deficiency     PAST SURGICAL HISTORY: Past Surgical History:  Procedure Laterality Date  . APPENDECTOMY    . CHOLECYSTECTOMY    . EUS N/A 03/23/2014   Procedure: ESOPHAGEAL ENDOSCOPIC ULTRASOUND (EUS) RADIAL;  Surgeon: Arta Silence, MD;  Location: [facility];  Service: Endoscopy;  Laterality: N/A;  . FINE NEEDLE ASPIRATION N/A 03/23/2014   Procedure: FINE NEEDLE ASPIRATION (FNA) LINEAR;  Surgeon: Arta Silence, MD;  Location: [facility];  Service: Endoscopy;  Laterality: N/A;  . TONSILLECTOMY      SOCIAL HISTORY: Social History  Substance Use Topics  . Smoking status: Never Smoker  . Smokeless tobacco: Never Used  . Alcohol use No    FAMILY HISTORY: Family History  Problem Relation Age of Onset  . Healthy Mother   . Hypertension Mother   . Hyperlipidemia Mother   . Alcoholism Mother   . Anxiety disorder Mother   . Drug abuse Mother   . Healthy Father   . Colon cancer Paternal Grandfather 32    colon    ROS: Review of Systems  Constitutional: Positive for weight loss.  Endo/Heme/Allergies:       Negative hypoglycemia  Psychiatric/Behavioral: The patient has insomnia.     PHYSICAL EXAM: Blood pressure 114/80, pulse 79, temperature 97.8 F (36.6 C), temperature source Oral, height 5\' 5"  (1.651 m), weight 239 lb (108.4 kg), SpO2 100 %. Body mass index is 39.77 kg/m. Physical Exam  Constitutional: She is oriented to person, place, and time. She appears well-developed and well-nourished.  Cardiovascular: Normal rate.   Pulmonary/Chest: Effort normal.  Musculoskeletal: Normal range of motion.  Neurological: She is oriented to person, place, and time.  Skin: Skin is warm and dry.  Psychiatric: She has a normal mood and affect. Her behavior is normal.  Vitals reviewed.   RECENT LABS AND TESTS: BMET    Component Value Date/Time   NA 140 04/18/2016 1201   K 4.3 04/18/2016 1201   CL 101 04/18/2016 1201   CO2 23 04/18/2016 1201   GLUCOSE 100 (H) 04/18/2016  1201   GLUCOSE 99 09/26/2014 2012   BUN 7 04/18/2016 1201   CREATININE 0.72 04/18/2016 1201   CREATININE 0.65 05/05/2014 0902   CALCIUM 9.7 04/18/2016 1201   GFRNONAA 103 04/18/2016 1201   GFRNONAA >89 05/05/2014 0902   GFRAA 119 04/18/2016 1201   GFRAA >89 05/05/2014 0902   Lab Results  Component Value Date   HGBA1C 5.5 04/18/2016   HGBA1C 6.3 (H) 01/02/2016   HGBA1C WILL FOLLOW 01/02/2016  HGBA1C 6.3 (H) 08/24/2015   HGBA1C 6 02/14/2015   Lab Results  Component Value Date   INSULIN 21.8 04/18/2016   INSULIN 15.3 01/02/2016   INSULIN WILL FOLLOW 01/02/2016   CBC    Component Value Date/Time   WBC 9.2 04/18/2016 1201   WBC 10.7 (H) 09/26/2014 2012   RBC 4.50 04/18/2016 1201   RBC 4.53 09/26/2014 2012   HGB 13.6 09/26/2014 2012   HCT 40.8 04/18/2016 1201   PLT 392 (H) 04/18/2016 1201   MCV 91 04/18/2016 1201   MCH 30.4 04/18/2016 1201   MCH 30.0 09/26/2014 2012   MCHC 33.6 04/18/2016 1201   MCHC 34.0 09/26/2014 2012   RDW 12.8 04/18/2016 1201   LYMPHSABS 2.4 04/18/2016 1201   MONOABS 0.6 05/05/2014 0902   EOSABS 0.1 04/18/2016 1201   BASOSABS 0.0 04/18/2016 1201   Iron/TIBC/Ferritin/ %Sat No results found for: IRON, TIBC, FERRITIN, IRONPCTSAT Lipid Panel     Component Value Date/Time   CHOL 140 04/18/2016 1201   TRIG 92 04/18/2016 1201   HDL 46 04/18/2016 1201   CHOLHDL 3.4 05/05/2014 0902   VLDL 16 05/05/2014 0902   LDLCALC 76 04/18/2016 1201   Hepatic Function Panel     Component Value Date/Time   PROT 7.8 04/18/2016 1201   ALBUMIN 4.5 04/18/2016 1201   AST 17 04/18/2016 1201   ALT 25 04/18/2016 1201   ALKPHOS 116 04/18/2016 1201   BILITOT 0.5 04/18/2016 1201      Component Value Date/Time   TSH 2.090 04/18/2016 1201   TSH 2.940 01/02/2016 1503   TSH 0.019 (L) 01/02/2016 1449    ASSESSMENT AND PLAN: Other insomnia  Type 2 diabetes mellitus without complication, without long-term current use of insulin ([facility]) - Plan: liraglutide (VICTOZA)  18 MG/3ML SOPN  Class 2 obesity without serious comorbidity with body mass index (BMI) of 39.0 to 39.9 in adult, unspecified obesity type  PLAN:  Diabetes II Jasmine Martinez has been given extensive diabetes education by myself today including ideal fasting and post-prandial blood glucose readings, individual ideal Hgb A1c goals  and hypoglycemia prevention. We discussed the importance of good blood sugar control to decrease the likelihood of diabetic complications such as nephropathy, neuropathy, limb loss, blindness, coronary artery disease, and death. We discussed the importance of intensive lifestyle modification including diet, exercise and weight loss as the first line treatment for diabetes. Jasmine Martinez agrees to continue Victoza, we will refill for 1 month and will follow up at the agreed upon time.  Insomnia Jasmine Martinez agrees to continue Melatonin OTC as directed for at least 1 month to see if it is helping and will follow up at the agreed upon time.  Obesity Jasmine Martinez is currently in the action stage of change. As such, her goal is to continue with weight loss efforts She has agreed to keep a food journal with 1300 to 1600 calories and 80 grams of protein daily Jasmine Martinez has been instructed to work up to a goal of 150 minutes of combined cardio and strengthening exercise per week for weight loss and overall health benefits. We discussed the following Behavioral Modification Stratagies today: increasing lean protein intake and decreasing simple carbohydrates   Jasmine Martinez has agreed to follow up with our clinic in 1 week. She was informed of the importance of frequent follow up visits to maximize her success with intensive lifestyle modifications for her multiple health conditions.  Corey Skains, am acting as scribe for Dennard Nip, MD  I have reviewed the  above documentation for accuracy and completeness, and I agree with the above. -Dennard Nip, MD

## 2016-05-09 DIAGNOSIS — K640 First degree hemorrhoids: Secondary | ICD-10-CM | POA: Diagnosis not present

## 2016-05-09 DIAGNOSIS — Z86010 Personal history of colonic polyps: Secondary | ICD-10-CM | POA: Diagnosis not present

## 2016-05-12 NOTE — Patient Instructions (Addendum)
Cipro 500 mg twice daily for 7 days. Urine culture pending. 

## 2016-05-14 ENCOUNTER — Ambulatory Visit (INDEPENDENT_AMBULATORY_CARE_PROVIDER_SITE_OTHER): Payer: 59 | Admitting: Family Medicine

## 2016-05-14 VITALS — BP 110/74 | HR 87 | Temp 97.8°F | Ht 65.0 in | Wt 237.0 lb

## 2016-05-14 DIAGNOSIS — E669 Obesity, unspecified: Secondary | ICD-10-CM

## 2016-05-14 DIAGNOSIS — Z794 Long term (current) use of insulin: Secondary | ICD-10-CM | POA: Diagnosis not present

## 2016-05-14 DIAGNOSIS — Z6839 Body mass index (BMI) 39.0-39.9, adult: Secondary | ICD-10-CM | POA: Diagnosis not present

## 2016-05-14 DIAGNOSIS — E119 Type 2 diabetes mellitus without complications: Secondary | ICD-10-CM

## 2016-05-14 DIAGNOSIS — F411 Generalized anxiety disorder: Secondary | ICD-10-CM | POA: Diagnosis not present

## 2016-05-14 MED ORDER — TRUEPLUS LANCETS 30G MISC: 1.0000 | Freq: Every day | 0 refills | Status: DC

## 2016-05-14 MED ORDER — TRUE METRIX BLOOD GLUCOSE TEST VI STRP: 1.0000 | ORAL_STRIP | Freq: Every day | 0 refills | Status: DC

## 2016-05-14 NOTE — Progress Notes (Signed)
Office: [phone]  /  Fax: [phone]   HPI:   Chief Complaint: OBESITY Jasmine Martinez is here to discuss her progress with her obesity treatment plan. She is on the  keep a food journal with 1300 to 1500 calories and 80 grams of protein daily and is following her eating plan approximately 100 % of the time. She states she is walking 15 minutes 3 times per week. Jasmine Martinez continues to do well with weight loss, she is still struggling with emotional eating but is doing better with coping mechanisms. Her weight is 237 lb (107.5 kg) today and has had a weight loss of 2 pounds over a period of 1 week since her last visit. She has lost 23 lbs since starting treatment with Jasmine Martinez.  Diabetes II Jasmine Martinez has a diagnosis of diabetes type II. She is doing very well with weight loss and diet. Her blood sugars are well controlled and she denies any hypoglycemic episodes. Last A1c was at 5.5 She has been working on intensive lifestyle modifications including diet, exercise, and weight loss to help control her blood glucose levels.  Wt Readings from Last 500 Encounters:  05/14/16 237 lb (107.5 kg)  05/08/16 239 lb (108.4 kg)  05/01/16 243 lb (110.2 kg)  04/23/16 250 lb (113.4 kg)  04/18/16 243 lb (110.2 kg)  04/04/16 245 lb (111.1 kg)  03/27/16 248 lb (112.5 kg)  03/18/16 248 lb (112.5 kg)  03/11/16 247 lb (112 kg)  03/04/16 249 lb (112.9 kg)  02/26/16 249 lb (112.9 kg)  02/19/16 253 lb (114.8 kg)  02/05/16 253 lb (114.8 kg)  01/29/16 243 lb (110.2 kg)  01/24/16 252 lb (114.3 kg)  01/10/16 256 lb (116.1 kg)  01/02/16 260 lb 9.8 oz (118.2 kg)  04/26/15 252 lb (114.3 kg)  02/14/15 258 lb 3.2 oz (117.1 kg)  02/14/15 258 lb 3.2 oz (117.1 kg)  11/22/14 265 lb (120.2 kg)  11/15/14 270 lb 8 oz (122.7 kg)  11/01/14 260 lb (117.9 kg)  10/03/14 274 lb 6.4 oz (124.5 kg)  09/26/14 270 lb (122.5 kg)  06/23/14 267 lb 9.6 oz (121.4 kg)  05/06/14 258 lb (117 kg)  12/13/13 245 lb (111.1 kg)  11/15/13 244 lb  9.6 oz (110.9 kg)  09/24/12 270 lb (122.5 kg)  08/21/12 271 lb (122.9 kg)  08/06/12 268 lb (121.6 kg)  06/13/12 268 lb (121.6 kg)  05/27/12 266 lb (120.7 kg)  02/21/12 259 lb 8 oz (117.7 kg)  11/16/11 259 lb 9.6 oz (117.8 kg)  01/15/11 257 lb 4 oz (116.7 kg)     ALLERGIES: Allergies  Allergen Reactions  . Codeine Shortness Of Breath  . Hydrocodone Shortness Of Breath    Mild SOB    MEDICATIONS: Current Outpatient Prescriptions on File Prior to Visit  Medication Sig Dispense Refill  . atorvastatin (LIPITOR) 10 MG tablet Take 1 tablet (10 mg total) by mouth daily. 30 tablet 0  . buPROPion (WELLBUTRIN SR) 200 MG 12 hr tablet Take 1 tablet (200 mg total) by mouth daily. 30 tablet 0  . ciprofloxacin (CIPRO) 500 MG tablet Take 1 tablet (500 mg total) by mouth 2 (two) times daily. 14 tablet 0  . levonorgestrel (MIRENA) 20 MCG/24HR IUD 1 each by Intrauterine route once.    . liraglutide (VICTOZA) 18 MG/3ML SOPN Inject 0.2 mLs (1.2 mg total) into the skin every morning. 3 pen 0  . Melatonin 10 MG TABS Take 1 tablet by mouth at bedtime. 30 tablet 0  . NOVOFINE  32G X 6 MM MISC Inject 1 Stick as directed daily. 90 each 5  . TRUE METRIX BLOOD GLUCOSE TEST test strip Apply 1 strip topically daily.  3  . TRUEPLUS LANCETS 30G MISC Inject 1 Stick as directed daily. 100 each 6  . Vitamin D, Ergocalciferol, (DRISDOL) 50000 units CAPS capsule Take 1 capsule (50,000 Units total) by mouth every 7 (seven) days. 4 capsule 0   No current facility-administered medications on file prior to visit.     PAST MEDICAL HISTORY: Past Medical History:  Diagnosis Date  . Anxiety   . Back pain   . Chest pain   . Depression   . Diabetes mellitus   . Diarrhea   . Edema    feet and legs  . Gallbladder problem   . 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 GERD (gastroesophageal reflux disease)   . Heartburn   . HTN (hypertension)   . Hyperlipidemia   . Obesity   . Palpitations     . PMDD (premenstrual dysphoric disorder) 01/02/2016  . Shortness of breath   . Vitamin D deficiency   . Vitamin D deficiency     PAST SURGICAL HISTORY: Past Surgical History:  Procedure Laterality Date  . APPENDECTOMY    . CHOLECYSTECTOMY    . EUS N/A 03/23/2014   Procedure: ESOPHAGEAL ENDOSCOPIC ULTRASOUND (EUS) RADIAL;  Surgeon: Arta Silence, MD;  Location: [facility];  Service: Endoscopy;  Laterality: N/A;  . FINE NEEDLE ASPIRATION N/A 03/23/2014   Procedure: FINE NEEDLE ASPIRATION (FNA) LINEAR;  Surgeon: Arta Silence, MD;  Location: [facility];  Service: Endoscopy;  Laterality: N/A;  . TONSILLECTOMY      SOCIAL HISTORY: Social History  Substance Use Topics  . Smoking status: Never Smoker  . Smokeless tobacco: Never Used  . Alcohol use No    FAMILY HISTORY: Family History  Problem Relation Age of Onset  . Healthy Mother   . Hypertension Mother   . Hyperlipidemia Mother   . Alcoholism Mother   . Anxiety disorder Mother   . Drug abuse Mother   . Healthy Father   . Colon cancer Paternal Grandfather 53    colon    ROS: Review of Systems  Constitutional: Positive for weight loss.  Endo/Heme/Allergies:       Negative hypoglycemia    PHYSICAL EXAM: Blood pressure 110/74, pulse 87, temperature 97.8 F (36.6 C), temperature source Oral, height 5\' 5"  (1.651 m), weight 237 lb (107.5 kg), SpO2 100 %. Body mass index is 39.44 kg/m. Physical Exam  Constitutional: She is oriented to person, place, and time. She appears well-developed and well-nourished.  Cardiovascular: Normal rate.   Pulmonary/Chest: Effort normal.  Musculoskeletal: Normal range of motion.  Neurological: She is oriented to person, place, and time.  Skin: Skin is warm and dry.  Psychiatric: She has a normal mood and affect. Her behavior is normal.  Vitals reviewed.   RECENT LABS AND TESTS: BMET    Component Value Date/Time   NA 140 04/18/2016 1201   K 4.3 04/18/2016 1201   CL 101  04/18/2016 1201   CO2 23 04/18/2016 1201   GLUCOSE 100 (H) 04/18/2016 1201   GLUCOSE 99 09/26/2014 2012   BUN 7 04/18/2016 1201   CREATININE 0.72 04/18/2016 1201   CREATININE 0.65 05/05/2014 0902   CALCIUM 9.7 04/18/2016 1201   GFRNONAA 103 04/18/2016 1201   GFRNONAA >89 05/05/2014 0902   GFRAA 119 04/18/2016 1201   GFRAA >89 05/05/2014  9622   Lab Results  Component Value Date   HGBA1C 5.5 04/18/2016   HGBA1C 6.3 (H) 01/02/2016   HGBA1C WILL FOLLOW 01/02/2016   HGBA1C 6.3 (H) 08/24/2015   HGBA1C 6 02/14/2015   Lab Results  Component Value Date   INSULIN 21.8 04/18/2016   INSULIN 15.3 01/02/2016   INSULIN WILL FOLLOW 01/02/2016   CBC    Component Value Date/Time   WBC 9.2 04/18/2016 1201   WBC 10.7 (H) 09/26/2014 2012   RBC 4.50 04/18/2016 1201   RBC 4.53 09/26/2014 2012   HGB 13.6 09/26/2014 2012   HCT 40.8 04/18/2016 1201   PLT 392 (H) 04/18/2016 1201   MCV 91 04/18/2016 1201   MCH 30.4 04/18/2016 1201   MCH 30.0 09/26/2014 2012   MCHC 33.6 04/18/2016 1201   MCHC 34.0 09/26/2014 2012   RDW 12.8 04/18/2016 1201   LYMPHSABS 2.4 04/18/2016 1201   MONOABS 0.6 05/05/2014 0902   EOSABS 0.1 04/18/2016 1201   BASOSABS 0.0 04/18/2016 1201   Iron/TIBC/Ferritin/ %Sat No results found for: IRON, TIBC, FERRITIN, IRONPCTSAT Lipid Panel     Component Value Date/Time   CHOL 140 04/18/2016 1201   TRIG 92 04/18/2016 1201   HDL 46 04/18/2016 1201   CHOLHDL 3.4 05/05/2014 0902   VLDL 16 05/05/2014 0902   LDLCALC 76 04/18/2016 1201   Hepatic Function Panel     Component Value Date/Time   PROT 7.8 04/18/2016 1201   ALBUMIN 4.5 04/18/2016 1201   AST 17 04/18/2016 1201   ALT 25 04/18/2016 1201   ALKPHOS 116 04/18/2016 1201   BILITOT 0.5 04/18/2016 1201      Component Value Date/Time   TSH 2.090 04/18/2016 1201   TSH 2.940 01/02/2016 1503   TSH 0.019 (L) 01/02/2016 1449    ASSESSMENT AND PLAN: Type 2 diabetes mellitus without complication, with long-term current  use of insulin ([place]) - Plan: TRUE METRIX BLOOD GLUCOSE TEST test strip, TRUEPLUS LANCETS 30G MISC  Class 2 obesity without serious comorbidity with body mass index (BMI) of 39.0 to 39.9 in adult, unspecified obesity type  PLAN:  Diabetes II Jasmine Martinez has been given extensive diabetes education by myself today including ideal fasting and post-prandial blood glucose readings, individual ideal Hgb A1c goals  and hypoglycemia prevention. We discussed the importance of good blood sugar control to decrease the likelihood of diabetic complications such as nephropathy, neuropathy, limb loss, blindness, coronary artery disease, and death. We discussed the importance of intensive lifestyle modification including diet, exercise and weight loss as the first line treatment for diabetes. Jasmine Martinez agrees to continue her diabetes medications, we will refill accu chek lancets and test strips for 1 month and will follow up at the agreed upon time.  We spent > than 50% of the 15 minute visit on the counseling as documented in the note.  Obesity Tylynn is currently in the action stage of change. As such, her goal is to continue with weight loss efforts She has agreed to keep a food journal with 1300 to 1600 calories and 80+ grams of protein daily Jasmine Martinez has been instructed to work up to a goal of 150 minutes of combined cardio and strengthening exercise per week for weight loss and overall health benefits. We discussed the following Behavioral Modification Stratagies today: increasing lean protein intake, dealing with family or coworker sabotage and emotional eating strategies  Jasmine Martinez has agreed to follow up with our clinic in 1 weeks. She was informed of the importance of frequent  follow up visits to maximize her success with intensive lifestyle modifications for her multiple health conditions.  I, Doreene Nest, am acting as scribe for Dennard Nip, MD  I have reviewed the above documentation for accuracy  and completeness, and I agree with the above. -Dennard Nip, MD

## 2016-05-21 DIAGNOSIS — F411 Generalized anxiety disorder: Secondary | ICD-10-CM | POA: Diagnosis not present

## 2016-05-23 ENCOUNTER — Encounter (INDEPENDENT_AMBULATORY_CARE_PROVIDER_SITE_OTHER): Payer: Self-pay

## 2016-05-23 ENCOUNTER — Ambulatory Visit (INDEPENDENT_AMBULATORY_CARE_PROVIDER_SITE_OTHER): Payer: 59 | Admitting: Family Medicine

## 2016-05-28 DIAGNOSIS — F411 Generalized anxiety disorder: Secondary | ICD-10-CM | POA: Diagnosis not present

## 2016-06-04 DIAGNOSIS — F411 Generalized anxiety disorder: Secondary | ICD-10-CM | POA: Diagnosis not present

## 2016-06-11 DIAGNOSIS — F411 Generalized anxiety disorder: Secondary | ICD-10-CM | POA: Diagnosis not present

## 2016-06-13 ENCOUNTER — Ambulatory Visit (INDEPENDENT_AMBULATORY_CARE_PROVIDER_SITE_OTHER): Payer: 59 | Admitting: Family Medicine

## 2016-06-13 VITALS — BP 112/75 | HR 83 | Temp 98.5°F | Ht 65.0 in | Wt 242.0 lb

## 2016-06-13 DIAGNOSIS — E559 Vitamin D deficiency, unspecified: Secondary | ICD-10-CM

## 2016-06-13 DIAGNOSIS — F3289 Other specified depressive episodes: Secondary | ICD-10-CM

## 2016-06-13 MED ORDER — BUPROPION HCL ER (SR) 200 MG PO TB12: 200.0000 mg | ORAL_TABLET | Freq: Every day | ORAL | 0 refills | Status: DC

## 2016-06-13 MED ORDER — VITAMIN D (ERGOCALCIFEROL) 1.25 MG (50000 UNIT) PO CAPS: 50000.0000 [IU] | ORAL_CAPSULE | ORAL | 0 refills | Status: DC

## 2016-06-13 MED FILL — VIT D2 1.25 MG (50,000 UNIT: 1.25 MG | 28 days supply | Qty: 4 | Fill #0

## 2016-06-13 MED FILL — BUPROPION HCL SR 200 MG TAB: 200 | 30 days supply | Qty: 30 | Fill #0

## 2016-06-13 NOTE — Progress Notes (Signed)
Office: [phone]  /  Fax: [phone]   HPI:   Chief Complaint: OBESITY Jasmine Martinez is here to discuss her progress with her obesity treatment plan. She is on the  keep a food journal with 1300 to 1600 calories and 80+ grams of protein  and is following her eating plan approximately 20 % of the time. She states she is exercising 0 minutes 0 times per week. [name] has gotten off track in the last 3 weeks and has had increased celebration eating. She had some family sabotage and some increased emotional eating but is ready to get back on track. Her weight is 242 lb (109.8 kg) today and has had a weight gain of 5 pounds over a period of 4 weeks since her last visit. She has lost 18 lbs since starting treatment with [place].  Vitamin D deficiency [name] has a diagnosis of vitamin D deficiency. She is stable on vit D. Fatigue is improved and she denies nausea, vomiting or muscle weakness.  Depression with emotional eating behaviors [name] mood is stable but has increased stress at home and at work. She istruggles with emotional eating and using food for comfort to the extent that it is negatively impacting her health. She often snacks when she is not hungry. [name] sometimes feels she is out of control and then feels guilty that she made poor food choices. She has been working on behavior modification techniques to help reduce her emotional eating and has been somewhat successful. She shows no sign of suicidal or homicidal ideations.  Depression screen [facility] 2/9 04/23/2016 01/02/2016 02/14/2015 06/23/2014  Decreased Interest 0 1 0 1  Down, Depressed, Hopeless 0 1 0 1  PHQ - 2 Score 0 2 0 2  Altered sleeping - 2 - 1  Tired, decreased energy - 3 - 1  Change in appetite - 3 - 0  Feeling bad or failure about yourself  - 3 - 0  Trouble concentrating - 0 - 0  Moving slowly or fidgety/restless - 0 - 0  Suicidal thoughts - 0 - 0  PHQ-9 Score - 13 - 4  Difficult doing work/chores - - - Not  difficult at all      ALLERGIES: Allergies  Allergen Reactions  . Codeine Shortness Of Breath  . Hydrocodone Shortness Of Breath    Mild SOB    MEDICATIONS: Current Outpatient Prescriptions on File Prior to Visit  Medication Sig Dispense Refill  . atorvastatin (LIPITOR) 10 MG tablet Take 1 tablet (10 mg total) by mouth daily. 30 tablet 0  . levonorgestrel (MIRENA) 20 MCG/24HR IUD 1 each by Intrauterine route once.    . liraglutide (VICTOZA) 18 MG/3ML SOPN Inject 0.2 mLs (1.2 mg total) into the skin every morning. 3 pen 0  . Melatonin 10 MG TABS Take 1 tablet by mouth at bedtime. 30 tablet 0  . NOVOFINE 32G X 6 MM MISC Inject 1 Stick as directed daily. 90 each 5  . TRUE METRIX BLOOD GLUCOSE TEST test strip 1 each by Other route daily. 1 stick daily 100 each 0  . TRUEPLUS LANCETS 30G MISC Inject 1 Stick as directed daily. 100 each 0  . ciprofloxacin (CIPRO) 500 MG tablet Take 1 tablet (500 mg total) by mouth 2 (two) times daily. (Patient not taking: Reported on 06/13/2016) 14 tablet 0   No current facility-administered medications on file prior to visit.     PAST MEDICAL HISTORY: Past Medical History:  Diagnosis Date  . Anxiety   .  Back pain   . Chest pain   . Depression   . Diabetes mellitus   . Diarrhea   . Edema    feet and legs  . Gallbladder problem   . 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 GERD (gastroesophageal reflux disease)   . Heartburn   . HTN (hypertension)   . Hyperlipidemia   . Obesity   . Palpitations   . PMDD (premenstrual dysphoric disorder) 01/02/2016  . Shortness of breath   . Vitamin D deficiency   . Vitamin D deficiency     PAST SURGICAL HISTORY: Past Surgical History:  Procedure Laterality Date  . APPENDECTOMY    . CHOLECYSTECTOMY    . EUS N/A 03/23/2014   Procedure: ESOPHAGEAL ENDOSCOPIC ULTRASOUND (EUS) RADIAL;  Surgeon: Arta Silence, MD;  Location: [facility];  Service: Endoscopy;  Laterality: N/A;  . FINE  NEEDLE ASPIRATION N/A 03/23/2014   Procedure: FINE NEEDLE ASPIRATION (FNA) LINEAR;  Surgeon: Arta Silence, MD;  Location: [facility];  Service: Endoscopy;  Laterality: N/A;  . TONSILLECTOMY      SOCIAL HISTORY: Social History  Substance Use Topics  . Smoking status: Never Smoker  . Smokeless tobacco: Never Used  . Alcohol use No    FAMILY HISTORY: Family History  Problem Relation Age of Onset  . Healthy Mother   . Hypertension Mother   . Hyperlipidemia Mother   . Alcoholism Mother   . Anxiety disorder Mother   . Drug abuse Mother   . Healthy Father   . Colon cancer Paternal Grandfather 1       colon    ROS: Review of Systems  Constitutional: Positive for malaise/fatigue. Negative for weight loss.  Gastrointestinal: Negative for nausea and vomiting.  Musculoskeletal:       Negative muscle weakness  Psychiatric/Behavioral: Positive for depression. Negative for suicidal ideas.       Stress    PHYSICAL EXAM: Blood pressure 112/75, pulse 83, temperature 98.5 F (36.9 C), temperature source Oral, height 5\' 5"  (1.651 m), weight 242 lb (109.8 kg), SpO2 98 %. Body mass index is 40.27 kg/m. Physical Exam  Constitutional: She is oriented to person, place, and time. She appears well-developed and well-nourished.  Cardiovascular: Normal rate.   Pulmonary/Chest: Effort normal.  Musculoskeletal: Normal range of motion.  Neurological: She is oriented to person, place, and time.  Skin: Skin is warm and dry.  Vitals reviewed.   RECENT LABS AND TESTS: BMET    Component Value Date/Time   NA 140 04/18/2016 1201   K 4.3 04/18/2016 1201   CL 101 04/18/2016 1201   CO2 23 04/18/2016 1201   GLUCOSE 100 (H) 04/18/2016 1201   GLUCOSE 99 09/26/2014 2012   BUN 7 04/18/2016 1201   CREATININE 0.72 04/18/2016 1201   CREATININE 0.65 05/05/2014 0902   CALCIUM 9.7 04/18/2016 1201   GFRNONAA 103 04/18/2016 1201   GFRNONAA >89 05/05/2014 0902   GFRAA 119 04/18/2016 1201   GFRAA >89  05/05/2014 0902   Lab Results  Component Value Date   HGBA1C 5.5 04/18/2016   HGBA1C 6.3 (H) 01/02/2016   HGBA1C WILL FOLLOW 01/02/2016   HGBA1C 6.3 (H) 08/24/2015   HGBA1C 6 02/14/2015   Lab Results  Component Value Date   INSULIN 21.8 04/18/2016   INSULIN 15.3 01/02/2016   INSULIN WILL FOLLOW 01/02/2016   CBC    Component Value Date/Time   WBC 9.2 04/18/2016 1201   WBC 10.7 (H) 09/26/2014 2012  RBC 4.50 04/18/2016 1201   RBC 4.53 09/26/2014 2012   HGB 13.6 09/26/2014 2012   HCT 40.8 04/18/2016 1201   PLT 392 (H) 04/18/2016 1201   MCV 91 04/18/2016 1201   MCH 30.4 04/18/2016 1201   MCH 30.0 09/26/2014 2012   MCHC 33.6 04/18/2016 1201   MCHC 34.0 09/26/2014 2012   RDW 12.8 04/18/2016 1201   LYMPHSABS 2.4 04/18/2016 1201   MONOABS 0.6 05/05/2014 0902   EOSABS 0.1 04/18/2016 1201   BASOSABS 0.0 04/18/2016 1201   Iron/TIBC/Ferritin/ %Sat No results found for: IRON, TIBC, FERRITIN, IRONPCTSAT Lipid Panel     Component Value Date/Time   CHOL 140 04/18/2016 1201   TRIG 92 04/18/2016 1201   HDL 46 04/18/2016 1201   CHOLHDL 3.4 05/05/2014 0902   VLDL 16 05/05/2014 0902   LDLCALC 76 04/18/2016 1201   Hepatic Function Panel     Component Value Date/Time   PROT 7.8 04/18/2016 1201   ALBUMIN 4.5 04/18/2016 1201   AST 17 04/18/2016 1201   ALT 25 04/18/2016 1201   ALKPHOS 116 04/18/2016 1201   BILITOT 0.5 04/18/2016 1201      Component Value Date/Time   TSH 2.090 04/18/2016 1201   TSH 2.940 01/02/2016 1503   TSH 0.019 (L) 01/02/2016 1449    ASSESSMENT AND PLAN: Other depression - Plan: buPROPion (WELLBUTRIN SR) 200 MG 12 hr tablet  Vitamin D deficiency - Plan: Vitamin D, Ergocalciferol, (DRISDOL) 50000 units CAPS capsule  Morbid obesity ([place])  PLAN:  Vitamin D Deficiency Tonya was informed that low vitamin D levels contributes to fatigue and are associated with obesity, breast, and colon cancer. She agrees to continue to take prescription Vit D  @50 ,000 IU every week, we will refill for 1 month and will follow up for routine testing of vitamin D, at least 2-3 times per year. She was informed of the risk of over-replacement of vitamin D and agrees to not increase her dose unless he discusses this with [place] first. Enaya agrees to follow up with our clinic in 1 week.  Depression with Emotional Eating Behaviors We discussed behavior modification techniques today to help Tyishia deal with her emotional eating and depression. She has agreed to continue cognitive behavioral therapy to decrease emotional eating. Clifton agreed to continue to take Wellbutrin SR 200 mg qd, we will refill for 1 month and she agreed to follow up as directed.  Obesity Zoila is currently in the action stage of change. As such, her goal is to continue with weight loss efforts She has agreed to keep a food journal with 1300 to 1600 calories and 80+ grams of protein  Annella has been instructed to work up to a goal of 150 minutes of combined cardio and strengthening exercise per week for weight loss and overall health benefits. We discussed the following Behavioral Modification Strategies today: increasing lean protein intake and increasing lower sugar fruits  Madell has agreed to follow up with our clinic in 1 week. She was informed of the importance of frequent follow up visits to maximize her success with intensive lifestyle modifications for her multiple health conditions.  I, Doreene Nest, am acting as scribe for Dennard Nip, MD  I have reviewed the above documentation for accuracy and completeness, and I agree with the above. -Dennard Nip, MD  OBESITY BEHAVIORAL INTERVENTION VISIT  Today's visit was # 16 out of 66.  Starting weight: 260 lbs Starting date: 01/02/16 Today's weight : 242 lbs Today's date: 06/13/2016 Total  lbs lost to date: 82 (Patients must lose 7 lbs in the first 6 months to continue with counseling)   ASK: We discussed the  diagnosis of obesity with [name] today and Lutricia agreed to give [place] permission to discuss obesity behavioral modification therapy today.  ASSESS: Shariah has the diagnosis of obesity and her BMI today is 40.4 Ann is in the action stage of change   ADVISE: Symphany was educated on the multiple health risks of obesity as well as the benefit of weight loss to improve her health. She was advised of the need for long term treatment and the importance of lifestyle modifications.  AGREE: Multiple dietary modification options and treatment options were discussed and  Shira agreed to keep a food journal with 1300 to 1600 calories and 80+ grams of protein  We discussed the following Behavioral Modification Strategies today: increasing lean protein intake and increasing lower sugar fruits

## 2016-06-18 DIAGNOSIS — F411 Generalized anxiety disorder: Secondary | ICD-10-CM | POA: Diagnosis not present

## 2016-06-20 ENCOUNTER — Ambulatory Visit (INDEPENDENT_AMBULATORY_CARE_PROVIDER_SITE_OTHER): Payer: 59 | Admitting: Family Medicine

## 2016-06-20 VITALS — BP 105/72 | HR 67 | Temp 98.1°F | Ht 65.0 in | Wt 238.0 lb

## 2016-06-20 DIAGNOSIS — Z6839 Body mass index (BMI) 39.0-39.9, adult: Secondary | ICD-10-CM

## 2016-06-20 DIAGNOSIS — E7849 Other hyperlipidemia: Secondary | ICD-10-CM

## 2016-06-20 DIAGNOSIS — E784 Other hyperlipidemia: Secondary | ICD-10-CM

## 2016-06-20 DIAGNOSIS — E119 Type 2 diabetes mellitus without complications: Secondary | ICD-10-CM | POA: Diagnosis not present

## 2016-06-20 DIAGNOSIS — E669 Obesity, unspecified: Secondary | ICD-10-CM | POA: Diagnosis not present

## 2016-06-20 MED ORDER — GLUCOSE BLOOD VI STRP: ORAL_STRIP | 0 refills | Status: DC

## 2016-06-20 MED ORDER — LIRAGLUTIDE 18 MG/3ML ~~LOC~~ SOPN: 1.8000 mg | PEN_INJECTOR | SUBCUTANEOUS | 0 refills | Status: DC

## 2016-06-20 MED ORDER — ATORVASTATIN CALCIUM 10 MG PO TABS: 10.0000 mg | ORAL_TABLET | Freq: Every day | ORAL | 0 refills | Status: DC

## 2016-06-20 MED ORDER — ACCU-CHEK FASTCLIX LANCETS MISC: 1.0000 | Freq: Every morning | 0 refills | Status: DC

## 2016-06-20 MED FILL — VICTOZA 18 MG/3 ML INJECT P: 18 | 30 days supply | Qty: 9 | Fill #0

## 2016-06-20 MED FILL — ACCU-CHEK GUIDE TEST STRIP: 30 days supply | Qty: 100 | Fill #0

## 2016-06-20 MED FILL — ACCU-CHEK FASTCLIX LANCETS: 90 days supply | Qty: 102 | Fill #0

## 2016-06-20 MED FILL — ATORVASTATIN 10 MG TABLET: 10 | 30 days supply | Qty: 30 | Fill #0

## 2016-06-20 NOTE — Progress Notes (Signed)
Office: [phone]  /  Fax: [phone]   HPI:   Chief Complaint: OBESITY Jasmine Martinez is here to discuss her progress with her obesity treatment plan. She is on the  keep a food journal with 1300 to 1600 calories and 80+ grams of protein  and is following her eating plan approximately 75 % of the time. She states she is walking 15 minutes 2 times per week. Jasmine Martinez has been doing well with weight loss. She has gotten back into the routine and is working on planning meals ahead of time. She is still struggling with family sabotage. Her weight is 238 lb (108 kg) today and has had a weight loss of 4 pounds over a period of 1 week since her last visit. She has lost 22 lbs since starting treatment with [place].  Diabetes II Jasmine Martinez has a diagnosis of diabetes type II. Palmira's blood sugars are well controlled but polyphagia seems to be worsening. Last A1c was at 5.5 She has been working on intensive lifestyle modifications including diet, exercise, and weight loss to help control her blood glucose levels.  Hyperlipidemia Jasmine Martinez has hyperlipidemia and has been trying to improve her cholesterol levels with intensive lifestyle modification including a low saturated fat diet, exercise and weight loss. She denies any chest pain, claudication or myalgias.   ALLERGIES: Allergies  Allergen Reactions  . Codeine Shortness Of Breath  . Hydrocodone Shortness Of Breath    Mild SOB    MEDICATIONS: Current Outpatient Prescriptions on File Prior to Visit  Medication Sig Dispense Refill  . buPROPion (WELLBUTRIN SR) 200 MG 12 hr tablet Take 1 tablet (200 mg total) by mouth daily. 30 tablet 0  . ciprofloxacin (CIPRO) 500 MG tablet Take 1 tablet (500 mg total) by mouth 2 (two) times daily. 14 tablet 0  . levonorgestrel (MIRENA) 20 MCG/24HR IUD 1 each by Intrauterine route once.    . Melatonin 10 MG TABS Take 1 tablet by mouth at bedtime. 30 tablet 0  . NOVOFINE 32G X 6 MM MISC Inject 1 Stick as directed  daily. 90 each 5  . TRUE METRIX BLOOD GLUCOSE TEST test strip 1 each by Other route daily. 1 stick daily 100 each 0  . TRUEPLUS LANCETS 30G MISC Inject 1 Stick as directed daily. 100 each 0  . Vitamin D, Ergocalciferol, (DRISDOL) 50000 units CAPS capsule Take 1 capsule (50,000 Units total) by mouth every 7 (seven) days. 4 capsule 0   No current facility-administered medications on file prior to visit.     PAST MEDICAL HISTORY: Past Medical History:  Diagnosis Date  . Anxiety   . Back pain   . Chest pain   . Depression   . Diabetes mellitus   . Diarrhea   . Edema    feet and legs  . Gallbladder problem   . 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 GERD (gastroesophageal reflux disease)   . Heartburn   . HTN (hypertension)   . Hyperlipidemia   . Obesity   . Palpitations   . PMDD (premenstrual dysphoric disorder) 01/02/2016  . Shortness of breath   . Vitamin D deficiency   . Vitamin D deficiency     PAST SURGICAL HISTORY: Past Surgical History:  Procedure Laterality Date  . APPENDECTOMY    . CHOLECYSTECTOMY    . EUS N/A 03/23/2014   Procedure: ESOPHAGEAL ENDOSCOPIC ULTRASOUND (EUS) RADIAL;  Surgeon: Arta Silence, MD;  [facility];  Service: Endoscopy;  Laterality: N/A;  .  FINE NEEDLE ASPIRATION N/A 03/23/2014   Procedure: FINE NEEDLE ASPIRATION (FNA) LINEAR;  Surgeon: Arta Silence, MD;  [facility];  Service: Endoscopy;  Laterality: N/A;  . TONSILLECTOMY      SOCIAL HISTORY: Social History  Substance Use Topics  . Smoking status: Never Smoker  . Smokeless tobacco: Never Used  . Alcohol use No    FAMILY HISTORY: Family History  Problem Relation Age of Onset  . Healthy Mother   . Hypertension Mother   . Hyperlipidemia Mother   . Alcoholism Mother   . Anxiety disorder Mother   . Drug abuse Mother   . Healthy Father   . Colon cancer Paternal Grandfather 66       colon    ROS: Review of Systems  Constitutional:  Positive for weight loss.  Cardiovascular: Negative for chest pain and claudication.  Musculoskeletal: Negative for myalgias.  Endo/Heme/Allergies:       Polyphagia (worsening)    PHYSICAL EXAM: Blood pressure 105/72, pulse 67, temperature 98.1 F (36.7 C), temperature source Oral, height 5\' 5"  (1.651 m), weight 238 lb (108 kg), SpO2 99 %. Body mass index is 39.61 kg/m. Physical Exam  Constitutional: She is oriented to person, place, and time. She appears well-developed and well-nourished.  Cardiovascular: Normal rate.   Pulmonary/Chest: Effort normal.  Musculoskeletal: Normal range of motion.  Neurological: She is oriented to person, place, and time.  Skin: Skin is warm and dry.  Psychiatric: She has a normal mood and affect. Her behavior is normal.  Vitals reviewed.   RECENT LABS AND TESTS: BMET    Component Value Date/Time   NA 140 04/18/2016 1201   K 4.3 04/18/2016 1201   CL 101 04/18/2016 1201   CO2 23 04/18/2016 1201   GLUCOSE 100 (H) 04/18/2016 1201   GLUCOSE 99 09/26/2014 2012   BUN 7 04/18/2016 1201   CREATININE 0.72 04/18/2016 1201   CREATININE 0.65 05/05/2014 0902   CALCIUM 9.7 04/18/2016 1201   GFRNONAA 103 04/18/2016 1201   GFRNONAA >89 05/05/2014 0902   GFRAA 119 04/18/2016 1201   GFRAA >89 05/05/2014 0902   Lab Results  Component Value Date   HGBA1C 5.5 04/18/2016   HGBA1C 6.3 (H) 01/02/2016   HGBA1C WILL FOLLOW 01/02/2016   HGBA1C 6.3 (H) 08/24/2015   HGBA1C 6 02/14/2015   Lab Results  Component Value Date   INSULIN 21.8 04/18/2016   INSULIN 15.3 01/02/2016   INSULIN WILL FOLLOW 01/02/2016   CBC    Component Value Date/Time   WBC 9.2 04/18/2016 1201   WBC 10.7 (H) 09/26/2014 2012   RBC 4.50 04/18/2016 1201   RBC 4.53 09/26/2014 2012   HGB 13.7 04/18/2016 1201   HCT 40.8 04/18/2016 1201   PLT 392 (H) 04/18/2016 1201   MCV 91 04/18/2016 1201   MCH 30.4 04/18/2016 1201   MCH 30.0 09/26/2014 2012   MCHC 33.6 04/18/2016 1201   MCHC 34.0  09/26/2014 2012   RDW 12.8 04/18/2016 1201   LYMPHSABS 2.4 04/18/2016 1201   MONOABS 0.6 05/05/2014 0902   EOSABS 0.1 04/18/2016 1201   BASOSABS 0.0 04/18/2016 1201   Iron/TIBC/Ferritin/ %Sat No results found for: IRON, TIBC, FERRITIN, IRONPCTSAT Lipid Panel     Component Value Date/Time   CHOL 140 04/18/2016 1201   TRIG 92 04/18/2016 1201   HDL 46 04/18/2016 1201   CHOLHDL 3.4 05/05/2014 0902   VLDL 16 05/05/2014 0902   LDLCALC 76 04/18/2016 1201   Hepatic Function Panel  Component Value Date/Time   PROT 7.8 04/18/2016 1201   ALBUMIN 4.5 04/18/2016 1201   AST 17 04/18/2016 1201   ALT 25 04/18/2016 1201   ALKPHOS 116 04/18/2016 1201   BILITOT 0.5 04/18/2016 1201      Component Value Date/Time   TSH 2.090 04/18/2016 1201   TSH 2.940 01/02/2016 1503   TSH 0.019 (L) 01/02/2016 1449    ASSESSMENT AND PLAN: Type 2 diabetes mellitus without complication, without long-term current use of insulin (HCC) - Plan: liraglutide (VICTOZA) 18 MG/3ML SOPN, glucose blood (ACCU-CHEK GUIDE) test strip, ACCU-CHEK FASTCLIX LANCETS MISC  Other hyperlipidemia - Plan: atorvastatin (LIPITOR) 10 MG tablet  Class 2 obesity without serious comorbidity with body mass index (BMI) of 39.0 to 39.9 in adult, unspecified obesity type  PLAN:  Diabetes II Jasmine Martinez has been given extensive diabetes education by myself today including ideal fasting and post-prandial blood glucose readings, individual ideal Hgb A1c goals  and hypoglycemia prevention. We discussed the importance of good blood sugar control to decrease the likelihood of diabetic complications such as nephropathy, neuropathy, limb loss, blindness, coronary artery disease, and death. We discussed the importance of intensive lifestyle modification including diet, exercise and weight loss as the first line treatment for diabetes. Amairani agrees to increase victoza to 1.8 mg #3 pen pbox #1 month, lancets and test strips accu chek brand x 1 month  #100 and will follow up at the agreed upon time.  Hyperlipidemia Jasmine Martinez was informed of the American Heart Association Guidelines emphasizing intensive lifestyle modifications as the first line treatment for hyperlipidemia. We discussed many lifestyle modifications today in depth, and Jasmine Martinez will continue to work on decreasing saturated fats such as fatty red meat, butter and many fried foods. She will also increase vegetables and lean protein in her diet and continue to work on exercise and weight loss efforts. Jasmine Martinez agrees to continue lipitor, we will reefill for 1 month and will follow up with our clinic in 2 weeks.  Obesity Jasmine Martinez is currently in the action stage of change. As such, her goal is to continue with weight loss efforts She has agreed to keep a food journal with 1300 to 1600 calories and 80+ grams of protein  Jasmine Martinez has been instructed to work up to a goal of 150 minutes of combined cardio and strengthening exercise per week for weight loss and overall health benefits. We discussed the following Behavioral Modification Strategies today: meal planning & cooking strategies, increasing lean protein intake and dealing with family or coworker sabotage  Jasmine Martinez has agreed to follow up with our clinic in 2 weeks. She was informed of the importance of frequent follow up visits to maximize her success with intensive lifestyle modifications for her multiple health conditions.  I, Doreene Nest, am acting as scribe for Dennard Nip, MD  I have reviewed the above documentation for accuracy and completeness, and I agree with the above. -Dennard Nip, MD  OBESITY BEHAVIORAL INTERVENTION VISIT  Today's visit was # 17 out of 22.  Starting weight: 260 lbs Starting date: 01/02/16 Today's weight : 238 lbs Today's date: 06/20/2016 Total lbs lost to date: 22 (Patients must lose 7 lbs in the first 6 months to continue with counseling)   ASK: We discussed the diagnosis of obesity  with Jasmine Martinez today and Jasmine Martinez agreed to give [place] permission to discuss obesity behavioral modification therapy today.  ASSESS: Jasmine Martinez has the diagnosis of obesity and her BMI today is 39.7 Jasmine Martinez is in the action  stage of change   ADVISE: Jasmine Martinez was educated on the multiple health risks of obesity as well as the benefit of weight loss to improve her health. She was advised of the need for long term treatment and the importance of lifestyle modifications.  AGREE: Multiple dietary modification options and treatment options were discussed and  Jasmine Martinez agreed to keep a food journal with 1300 to 1600 calories and 80+ grams of protein  We discussed the following Behavioral Modification Strategies today: meal planning & cooking strategies, increasing lean protein intake and dealing with family or coworker sabotage

## 2016-07-01 ENCOUNTER — Telehealth: Payer: 59 | Admitting: Nurse Practitioner

## 2016-07-01 DIAGNOSIS — N3001 Acute cystitis with hematuria: Secondary | ICD-10-CM

## 2016-07-01 MED ORDER — CIPRO 500 MG PO TABS: 500.0000 mg | ORAL_TABLET | Freq: Two times a day (BID) | ORAL | 0 refills | Status: AC

## 2016-07-01 MED FILL — CIPROFLOXACIN HCL 500 MG TA: 500 | 5 days supply | Qty: 10 | Fill #0

## 2016-07-01 NOTE — Progress Notes (Signed)
We are sorry that you are not feeling well.  Here is how we plan to help!  Based on what you shared with me it looks like you most likely have a simple urinary tract infection.  A UTI (Urinary Tract Infection) is a bacterial infection of the bladder.  Most cases of urinary tract infections are simple to treat but a key part of your care is to encourage you to drink plenty of fluids and watch your symptoms carefully.  I have prescribed Ciprofloxacin 500 mg twice a day for 5 days.  Your symptoms should gradually improve. Call us if the burning in your urine worsens, you develop worsening fever, back pain or pelvic pain or if your symptoms do not resolve after completing the antibiotic.  Your last UTI was in March, 2018.  If they become more  frequent, I recommend a follow up with your primary care provider.     Urinary tract infections can be prevented by drinking plenty of water to keep your body hydrated.  Also be sure when you wipe, wipe from front to back and don't hold it in!  If possible, empty your bladder every 4 hours.  Your e-visit answers were reviewed by a board certified advanced clinical practitioner to complete your personal care plan.  Depending on the condition, your plan could have included both over the counter or prescription medications.  If there is a problem please reply  once you have received a response from your provider.  Your safety is important to [place].  If you have drug allergies check your prescription carefully.    You can use MyChart to ask questions about today's visit, request a non-urgent call back, or ask for a work or school excuse for 24 hours related to this e-Visit. If it has been greater than 24 hours you will need to follow up with your provider, or enter a new e-Visit to address those concerns.   You will get an e-mail in the next two days asking about your experience.  I hope that your e-visit has been valuable and will speed your recovery. Thank you for  using e-visits.

## 2016-07-02 DIAGNOSIS — F411 Generalized anxiety disorder: Secondary | ICD-10-CM | POA: Diagnosis not present

## 2016-07-04 ENCOUNTER — Ambulatory Visit (INDEPENDENT_AMBULATORY_CARE_PROVIDER_SITE_OTHER): Payer: 59 | Admitting: Family Medicine

## 2016-07-04 VITALS — BP 109/73 | HR 72 | Temp 98.0°F | Ht 65.0 in | Wt 242.0 lb

## 2016-07-04 DIAGNOSIS — F3289 Other specified depressive episodes: Secondary | ICD-10-CM | POA: Diagnosis not present

## 2016-07-04 DIAGNOSIS — E559 Vitamin D deficiency, unspecified: Secondary | ICD-10-CM | POA: Diagnosis not present

## 2016-07-04 MED ORDER — BUPROPION HCL ER (SR) 200 MG PO TB12: 200.0000 mg | ORAL_TABLET | Freq: Every day | ORAL | 0 refills | Status: DC

## 2016-07-04 MED ORDER — VITAMIN D (ERGOCALCIFEROL) 1.25 MG (50000 UNIT) PO CAPS: 50000.0000 [IU] | ORAL_CAPSULE | ORAL | 0 refills | Status: DC

## 2016-07-04 NOTE — Progress Notes (Signed)
Office: [phone]  /  Fax: [phone]   HPI:   Chief Complaint: OBESITY Jasmine Martinez is here to discuss her progress with her obesity treatment plan. She is on the  keep a food journal with 1300 to 1600 calories and 80+ grams of protein  and is following her eating plan approximately 50 % of the time. She states she is walking 10 to 15 minutes 2 times per week. Jasmine Martinez has been off track for the past 2 weeks. She has had increased emotional eating and increased celebration eating with son going to college. She hasn't journaled much but has tried to portion control. Her weight is 242 lb (109.8 kg) today and has had a weight gain of 4 pounds over a period of 2 weeks since her last visit. She has lost 18 lbs since starting treatment with [place].  Vitamin D deficiency Jasmine Martinez has a diagnosis of vitamin D deficiency. She is currently stable on vit D, not yet at goal and denies nausea, vomiting or muscle weakness.  Depression with emotional eating behaviors Jasmine Martinez is stable on wellbutrin but has had increased emotional eating with son going to college. Jason struggles with emotional eating and using food for comfort to the extent that it is negatively impacting her health. She often snacks when she is not hungry. Jasmine Martinez sometimes feels she is out of control and then feels guilty that she made poor food choices. She has been working on behavior modification techniques to help reduce her emotional eating and has been somewhat successful. She shows no sign of suicidal or homicidal ideations.  Depression screen [facility] 2/9 04/23/2016 01/02/2016 02/14/2015 06/23/2014  Decreased Interest 0 1 0 1  Down, Depressed, Hopeless 0 1 0 1  PHQ - 2 Score 0 2 0 2  Altered sleeping - 2 - 1  Tired, decreased energy - 3 - 1  Change in appetite - 3 - 0  Feeling bad or failure about yourself  - 3 - 0  Trouble concentrating - 0 - 0  Moving slowly or fidgety/restless - 0 - 0  Suicidal thoughts - 0 - 0  PHQ-9 Score -  13 - 4  Difficult doing work/chores - - - Not difficult at all     ALLERGIES: Allergies  Allergen Reactions  . Codeine Shortness Of Breath  . Hydrocodone Shortness Of Breath    Mild SOB    MEDICATIONS: Current Outpatient Prescriptions on File Prior to Visit  Medication Sig Dispense Refill  . ACCU-CHEK FASTCLIX LANCETS MISC 1 Bottle by Does not apply route every morning. 100 each 0  . atorvastatin (LIPITOR) 10 MG tablet Take 1 tablet (10 mg total) by mouth daily. 30 tablet 0  . CIPRO 500 MG tablet Take 1 tablet (500 mg total) by mouth 2 (two) times daily. 10 tablet 0  . glucose blood (ACCU-CHEK GUIDE) test strip Use as instructed 100 each 0  . levonorgestrel (MIRENA) 20 MCG/24HR IUD 1 each by Intrauterine route once.    . liraglutide (VICTOZA) 18 MG/3ML SOPN Inject 0.3 mLs (1.8 mg total) into the skin every morning. 3 pen 0  . Melatonin 10 MG TABS Take 1 tablet by mouth at bedtime. 30 tablet 0  . NOVOFINE 32G X 6 MM MISC Inject 1 Stick as directed daily. 90 each 5  . TRUE METRIX BLOOD GLUCOSE TEST test strip 1 each by Other route daily. 1 stick daily 100 each 0  . TRUEPLUS LANCETS 30G MISC Inject 1 Stick as directed daily. 100 each  0  . ciprofloxacin (CIPRO) 500 MG tablet Take 1 tablet (500 mg total) by mouth 2 (two) times daily. (Patient not taking: Reported on 07/04/2016) 14 tablet 0   No current facility-administered medications on file prior to visit.     PAST MEDICAL HISTORY: Past Medical History:  Diagnosis Date  . Anxiety   . Back pain   . Chest pain   . Depression   . Diabetes mellitus   . Diarrhea   . Edema    feet and legs  . Gallbladder problem   . 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 GERD (gastroesophageal reflux disease)   . Heartburn   . HTN (hypertension)   . Hyperlipidemia   . Obesity   . Palpitations   . PMDD (premenstrual dysphoric disorder) 01/02/2016  . Shortness of breath   . Vitamin D deficiency   . Vitamin D  deficiency     PAST SURGICAL HISTORY: Past Surgical History:  Procedure Laterality Date  . APPENDECTOMY    . CHOLECYSTECTOMY    . EUS N/A 03/23/2014   Procedure: ESOPHAGEAL ENDOSCOPIC ULTRASOUND (EUS) RADIAL;  Surgeon: Arta Silence, MD;  Location: [facility];  Service: Endoscopy;  Laterality: N/A;  . FINE NEEDLE ASPIRATION N/A 03/23/2014   Procedure: FINE NEEDLE ASPIRATION (FNA) LINEAR;  Surgeon: Arta Silence, MD;  Location: [facility];  Service: Endoscopy;  Laterality: N/A;  . TONSILLECTOMY      SOCIAL HISTORY: Social History  Substance Use Topics  . Smoking status: Never Smoker  . Smokeless tobacco: Never Used  . Alcohol use No    FAMILY HISTORY: Family History  Problem Relation Age of Onset  . Healthy Mother   . Hypertension Mother   . Hyperlipidemia Mother   . Alcoholism Mother   . Anxiety disorder Mother   . Drug abuse Mother   . Healthy Father   . Colon cancer Paternal Grandfather 16       colon    ROS: Review of Systems  Constitutional: Negative for weight loss.  Gastrointestinal: Negative for nausea and vomiting.  Musculoskeletal:       Negative muscle weakness  Psychiatric/Behavioral: Positive for depression. Negative for suicidal ideas.    PHYSICAL EXAM: Blood pressure 109/73, pulse 72, temperature 98 F (36.7 C), temperature source Oral, height 5\' 5"  (1.651 m), weight 242 lb (109.8 kg), SpO2 97 %. Body mass index is 40.27 kg/m. Physical Exam  Constitutional: She is oriented to person, place, and time. She appears well-developed and well-nourished.  Cardiovascular: Normal rate.   Pulmonary/Chest: Effort normal.  Musculoskeletal: Normal range of motion.  Neurological: She is oriented to person, place, and time.  Skin: Skin is warm and dry.  Vitals reviewed.   RECENT LABS AND TESTS: BMET    Component Value Date/Time   NA 140 04/18/2016 1201   K 4.3 04/18/2016 1201   CL 101 04/18/2016 1201   CO2 23 04/18/2016 1201   GLUCOSE 100 (H)  04/18/2016 1201   GLUCOSE 99 09/26/2014 2012   BUN 7 04/18/2016 1201   CREATININE 0.72 04/18/2016 1201   CREATININE 0.65 05/05/2014 0902   CALCIUM 9.7 04/18/2016 1201   GFRNONAA 103 04/18/2016 1201   GFRNONAA >89 05/05/2014 0902   GFRAA 119 04/18/2016 1201   GFRAA >89 05/05/2014 0902   Lab Results  Component Value Date   HGBA1C 5.5 04/18/2016   HGBA1C 6.3 (H) 01/02/2016   HGBA1C WILL FOLLOW 01/02/2016   HGBA1C 6.3 (H) 08/24/2015   HGBA1C  6 02/14/2015   Lab Results  Component Value Date   INSULIN 21.8 04/18/2016   INSULIN 15.3 01/02/2016   INSULIN WILL FOLLOW 01/02/2016   CBC    Component Value Date/Time   WBC 9.2 04/18/2016 1201   WBC 10.7 (H) 09/26/2014 2012   RBC 4.50 04/18/2016 1201   RBC 4.53 09/26/2014 2012   HGB 13.7 04/18/2016 1201   HCT 40.8 04/18/2016 1201   PLT 392 (H) 04/18/2016 1201   MCV 91 04/18/2016 1201   MCH 30.4 04/18/2016 1201   MCH 30.0 09/26/2014 2012   MCHC 33.6 04/18/2016 1201   MCHC 34.0 09/26/2014 2012   RDW 12.8 04/18/2016 1201   LYMPHSABS 2.4 04/18/2016 1201   MONOABS 0.6 05/05/2014 0902   EOSABS 0.1 04/18/2016 1201   BASOSABS 0.0 04/18/2016 1201   Iron/TIBC/Ferritin/ %Sat No results found for: IRON, TIBC, FERRITIN, IRONPCTSAT Lipid Panel     Component Value Date/Time   CHOL 140 04/18/2016 1201   TRIG 92 04/18/2016 1201   HDL 46 04/18/2016 1201   CHOLHDL 3.4 05/05/2014 0902   VLDL 16 05/05/2014 0902   LDLCALC 76 04/18/2016 1201   Hepatic Function Panel     Component Value Date/Time   PROT 7.8 04/18/2016 1201   ALBUMIN 4.5 04/18/2016 1201   AST 17 04/18/2016 1201   ALT 25 04/18/2016 1201   ALKPHOS 116 04/18/2016 1201   BILITOT 0.5 04/18/2016 1201      Component Value Date/Time   TSH 2.090 04/18/2016 1201   TSH 2.940 01/02/2016 1503   TSH 0.019 (L) 01/02/2016 1449    ASSESSMENT AND PLAN: Other depression - Plan: buPROPion (WELLBUTRIN SR) 200 MG 12 hr tablet  Vitamin D deficiency - Plan: Vitamin D, Ergocalciferol,  (DRISDOL) 50000 units CAPS capsule  Morbid obesity ([place])  PLAN:  Vitamin D Deficiency Jasmine Martinez was informed that low vitamin D levels contributes to fatigue and are associated with obesity, breast, and colon cancer. She agrees to continue to take prescription Vit D @50 ,000 IU every week, we will refill for 1 month and will follow up for routine testing of vitamin D, at least 2-3 times per year. She was informed of the risk of over-replacement of vitamin D and agrees to not increase her dose unless he discusses this with [place] first. Jasmine Martinez agrees to follow up with our clinic in 2 to 3 weeks.  Depression with Emotional Eating Behaviors We discussed behavior modification techniques today to help Tashiba deal with her emotional eating and depression. She has agreed to continue to take Wellbutrin SR 200 mg qd, we will refill for 1 month and she agreed to follow up as directed.  Obesity Jasmine Martinez is currently in the action stage of change. As such, her goal is to continue with weight loss efforts She has agreed to keep a food journal with 1300 to 1600 calories and 80+ grams of protein   Jasmine Martinez will try to portion control next week and back to journaling even if calories are too high Jasmine Martinez has been instructed to work up to a goal of 150 minutes of combined cardio and strengthening exercise per week for weight loss and overall health benefits. We discussed the following Behavioral Modification Strategies today: increasing lean protein intake, dealing with family or coworker sabotage and emotional eating strategies    Jasmine Martinez has agreed to follow up with our clinic in 2 to 3 weeks. She was informed of the importance of frequent follow up visits to maximize her success with intensive lifestyle  modifications for her multiple health conditions.  I, Doreene Nest, am acting as transcriptionist for Dennard Nip, MD  I have reviewed the above documentation for accuracy and completeness, and I agree  with the above. -Dennard Nip, MD  OBESITY BEHAVIORAL INTERVENTION VISIT  Today's visit was # 18 out of 22.  Starting weight: 260 lbs Starting date: 01/02/16 Today's weight : 242 lbs Today's date: 07/04/2016 Total lbs lost to date: 45 (Patients must lose 7 lbs in the first 6 months to continue with counseling)   ASK: We discussed the diagnosis of obesity with Jasmine Martinez today and Jasmine Martinez agreed to give [place] permission to discuss obesity behavioral modification therapy today.  ASSESS: Scotlyn has the diagnosis of obesity and her BMI today is 40.4 Aerielle is in the action stage of change   ADVISE: Kensy was educated on the multiple health risks of obesity as well as the benefit of weight loss to improve her health. She was advised of the need for long term treatment and the importance of lifestyle modifications.  AGREE: Multiple dietary modification options and treatment options were discussed and  Corie agreed to keep a food journal with 1300 to 1600 calories and 80+ grams of protein  We discussed the following Behavioral Modification Strategies today: increasing lean protein intake, dealing with family or coworker sabotage and emotional eating strategies

## 2016-07-14 DIAGNOSIS — M25571 Pain in right ankle and joints of right foot: Secondary | ICD-10-CM | POA: Diagnosis not present

## 2016-07-16 DIAGNOSIS — F411 Generalized anxiety disorder: Secondary | ICD-10-CM | POA: Diagnosis not present

## 2016-07-23 DIAGNOSIS — F411 Generalized anxiety disorder: Secondary | ICD-10-CM | POA: Diagnosis not present

## 2016-07-24 ENCOUNTER — Ambulatory Visit (INDEPENDENT_AMBULATORY_CARE_PROVIDER_SITE_OTHER): Payer: 59 | Admitting: Family Medicine

## 2016-07-24 VITALS — BP 120/78 | HR 55 | Temp 98.7°F | Ht 65.0 in | Wt 243.0 lb

## 2016-07-24 DIAGNOSIS — Z6841 Body Mass Index (BMI) 40.0 and over, adult: Secondary | ICD-10-CM | POA: Diagnosis not present

## 2016-07-24 DIAGNOSIS — IMO0001 Reserved for inherently not codable concepts without codable children: Secondary | ICD-10-CM

## 2016-07-24 DIAGNOSIS — E559 Vitamin D deficiency, unspecified: Secondary | ICD-10-CM | POA: Diagnosis not present

## 2016-07-24 DIAGNOSIS — E669 Obesity, unspecified: Secondary | ICD-10-CM | POA: Diagnosis not present

## 2016-07-24 MED ORDER — VITAMIN D (ERGOCALCIFEROL) 1.25 MG (50000 UNIT) PO CAPS: 50000.0000 [IU] | ORAL_CAPSULE | ORAL | 0 refills | Status: DC

## 2016-07-24 NOTE — Progress Notes (Signed)
Office: [phone]  /  Fax: [phone]   HPI:   Chief Complaint: OBESITY Jasmine Martinez is here to discuss her progress with her obesity treatment plan. She is on the  keep a food journal with 1300 to 1600 calories and 80+ grams of protein  and is following her eating plan approximately 20 % of the time. She states she is exercising 0 minutes 0 times per week. Jasmine Martinez notes increased emotional eating with son going to college. She gained approximately 6 pounds but has already gotten back on track. She is still up 6 pounds from her lowest weight. Her weight is 243 lb (110.2 kg) today and has had a weight gain of 1 pound over a period of 3 weeks since her last visit. She has lost 17 lbs since starting treatment with [place].  Vitamin D deficiency Jasmine Martinez has a diagnosis of vitamin D deficiency. She is currently stable on vit D, not yet at goal and denies nausea, vomiting or muscle weakness.  ALLERGIES: Allergies  Allergen Reactions   Codeine Shortness Of Breath   Hydrocodone Shortness Of Breath    Mild SOB    MEDICATIONS: Current Outpatient Prescriptions on File Prior to Visit  Medication Sig Dispense Refill   ACCU-CHEK FASTCLIX LANCETS MISC 1 Bottle by Does not apply route every morning. 100 each 0   atorvastatin (LIPITOR) 10 MG tablet Take 1 tablet (10 mg total) by mouth daily. 30 tablet 0   buPROPion (WELLBUTRIN SR) 200 MG 12 hr tablet Take 1 tablet (200 mg total) by mouth daily. 30 tablet 0   ciprofloxacin (CIPRO) 500 MG tablet Take 1 tablet (500 mg total) by mouth 2 (two) times daily. 14 tablet 0   glucose blood (ACCU-CHEK GUIDE) test strip Use as instructed 100 each 0   levonorgestrel (MIRENA) 20 MCG/24HR IUD 1 each by Intrauterine route once.     liraglutide (VICTOZA) 18 MG/3ML SOPN Inject 0.3 mLs (1.8 mg total) into the skin every morning. 3 pen 0   Melatonin 10 MG TABS Take 1 tablet by mouth at bedtime. 30 tablet 0   NOVOFINE 32G X 6 MM MISC Inject 1 Stick as directed  daily. 90 each 5   TRUE METRIX BLOOD GLUCOSE TEST test strip 1 each by Other route daily. 1 stick daily 100 each 0   TRUEPLUS LANCETS 30G MISC Inject 1 Stick as directed daily. 100 each 0   No current facility-administered medications on file prior to visit.     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   GERD (gastroesophageal reflux disease)    Heartburn    HTN (hypertension)    Hyperlipidemia    Obesity    Palpitations    PMDD (premenstrual dysphoric disorder) 01/02/2016   Shortness of breath    Vitamin D deficiency    Vitamin D deficiency     PAST SURGICAL HISTORY: Past Surgical History:  Procedure Laterality Date   APPENDECTOMY     CHOLECYSTECTOMY     EUS N/A 03/23/2014   Procedure: ESOPHAGEAL ENDOSCOPIC ULTRASOUND (EUS) RADIAL;  Surgeon: Arta Silence, MD;  Location: [facility];  Service: Endoscopy;  Laterality: N/A;   FINE NEEDLE ASPIRATION N/A 03/23/2014  Procedure: FINE NEEDLE ASPIRATION (FNA) LINEAR;  Surgeon: Arta Silence, MD;  Location: [facility];  Service: Endoscopy;  Laterality: N/A;   TONSILLECTOMY      SOCIAL HISTORY: Social History  Substance Use Topics   Smoking status: Never Smoker   Smokeless tobacco: Never Used   Alcohol use No    FAMILY HISTORY: Family History  Problem Relation Age of Onset   Healthy Mother    Hypertension Mother    Hyperlipidemia Mother    Alcoholism Mother    Anxiety disorder Mother    Drug abuse Mother    Healthy Father    Colon cancer Paternal Grandfather 34       colon    ROS: Review of Systems  Constitutional: Negative for weight loss.  Gastrointestinal: Negative for nausea and vomiting.  Musculoskeletal:       Negative muscle weakness      PHYSICAL EXAM: Blood pressure 120/78, pulse (!) 55, temperature 98.7 F (37.1 C), temperature source Oral, height 5\' 5"  (1.651 m), weight 243 lb (110.2 kg), SpO2 100 %. Body mass index is 40.44 kg/m. Physical Exam  Constitutional: She is oriented to person, place, and time. She appears well-developed and well-nourished.  Cardiovascular: Normal rate.   Pulmonary/Chest: Effort normal.  Musculoskeletal: Normal range of motion.  Neurological: She is oriented to person, place, and time.  Skin: Skin is warm and dry.  Psychiatric: She has a normal mood and affect. Her behavior is normal.  Vitals reviewed.   RECENT LABS AND TESTS: BMET    Component Value Date/Time   NA 140 04/18/2016 1201   K 4.3 04/18/2016 1201   CL 101 04/18/2016 1201   CO2 23 04/18/2016 1201   GLUCOSE 100 (H) 04/18/2016 1201   GLUCOSE 99 09/26/2014 2012   BUN 7 04/18/2016 1201   CREATININE 0.72 04/18/2016 1201   CREATININE 0.65 05/05/2014 0902   CALCIUM 9.7 04/18/2016 1201   GFRNONAA 103 04/18/2016 1201   GFRNONAA >89 05/05/2014 0902   GFRAA 119 04/18/2016 1201   GFRAA >89 05/05/2014 0902   Lab Results  Component Value Date   HGBA1C 5.5 04/18/2016   HGBA1C 6.3 (H) 01/02/2016   HGBA1C WILL FOLLOW 01/02/2016   HGBA1C 6.3 (H) 08/24/2015   HGBA1C 6 02/14/2015   Lab Results  Component Value Date   INSULIN 21.8 04/18/2016   INSULIN 15.3 01/02/2016   INSULIN WILL FOLLOW 01/02/2016   CBC    Component Value Date/Time   WBC 9.2 04/18/2016 1201   WBC 10.7 (H) 09/26/2014 2012   RBC 4.50 04/18/2016 1201   RBC 4.53 09/26/2014 2012   HGB 13.7 04/18/2016 1201   HCT 40.8 04/18/2016 1201   PLT 392 (H) 04/18/2016 1201   MCV 91 04/18/2016 1201   MCH 30.4 04/18/2016 1201   MCH 30.0 09/26/2014 2012   MCHC 33.6 04/18/2016 1201   MCHC 34.0 09/26/2014 2012   RDW 12.8 04/18/2016 1201   LYMPHSABS 2.4 04/18/2016 1201   MONOABS 0.6 05/05/2014 0902   EOSABS 0.1 04/18/2016 1201   BASOSABS 0.0 04/18/2016 1201    Iron/TIBC/Ferritin/ %Sat No results found for: IRON, TIBC, FERRITIN, IRONPCTSAT Lipid Panel     Component Value Date/Time   CHOL 140 04/18/2016 1201   TRIG 92 04/18/2016 1201   HDL 46 04/18/2016 1201   CHOLHDL 3.4 05/05/2014 0902   VLDL 16 05/05/2014 0902   LDLCALC 76 04/18/2016 1201   Hepatic Function Panel     Component Value Date/Time   PROT 7.8 04/18/2016  1201   ALBUMIN 4.5 04/18/2016 1201   AST 17 04/18/2016 1201   ALT 25 04/18/2016 1201   ALKPHOS 116 04/18/2016 1201   BILITOT 0.5 04/18/2016 1201      Component Value Date/Time   TSH 2.090 04/18/2016 1201   TSH 2.940 01/02/2016 1503   TSH 0.019 (L) 01/02/2016 1449    ASSESSMENT AND PLAN: Vitamin D deficiency - Plan: Vitamin D, Ergocalciferol, (DRISDOL) 50000 units CAPS capsule  Class 3 obesity without serious comorbidity with body mass index (BMI) of 40.0 to 44.9 in adult, unspecified obesity type (HCC)  PLAN:  Vitamin D Deficiency Jasmine Martinez was informed that low vitamin D levels contributes to fatigue and are associated with obesity, breast, and colon cancer. She agrees to continue to take prescription Vit D @50 ,000 IU every week, we will refill for 1 month and will follow up for routine testing of vitamin D, at least 2-3 times per year. She was informed of the risk of over-replacement of vitamin D and agrees to not increase her dose unless he discusses this with [place] first. Jasmine Martinez agrees to follow up with our clinic in 1 week.  Obesity Jasmine Martinez is currently in the action stage of change. As such, her goal is to continue with weight loss efforts She has agreed to keep a food journal with 1300 to 1600 calories and 80+ grams of protein daily Jasmine Martinez has been instructed to work up to a goal of 150 minutes of combined cardio and strengthening exercise per week for weight loss and overall health benefits. We discussed the following Behavioral Modification Strategies today: meal planning & cooking strategies, keeping  healthy foods in the home,  increasing lean protein intake and emotional eating strategies  Jasmine Martinez has agreed to follow up with our clinic in 1 week. She was informed of the importance of frequent follow up visits to maximize her success with intensive lifestyle modifications for her multiple health conditions.  I, Doreene Nest, am acting as transcriptionist for Dennard Nip, MD  I have reviewed the above documentation for accuracy and completeness, and I agree with the above. -Dennard Nip, MD   OBESITY BEHAVIORAL INTERVENTION VISIT  Today's visit was # 19 out of 22.  Starting weight: 260 lbs Starting date: 01/02/16 Today's weight : 243 lbs  Today's date: 07/24/2016 Total lbs lost to date: 17 (Patients must lose 7 lbs in the first 6 months to continue with counseling)   ASK: We discussed the diagnosis of obesity with Jasmine Martinez today and Jasmine Martinez agreed to give [place] permission to discuss obesity behavioral modification therapy today.  ASSESS: Jasmine Martinez has the diagnosis of obesity and her BMI today is 40.5 Jasmine Martinez is in the action stage of change   ADVISE: Jasmine Martinez was educated on the multiple health risks of obesity as well as the benefit of weight loss to improve her health. She was advised of the need for long term treatment and the importance of lifestyle modifications.  AGREE: Multiple dietary modification options and treatment options were discussed and  Jasmine Martinez agreed to keep a food journal with 1300 to 1600 calories and 80+ grams of protein daily We discussed the following Behavioral Modification Strategies today: meal planning & cooking strategies, keeping healthy foods in the home, increasing lean protein intake and emotional eating strategies

## 2016-07-30 DIAGNOSIS — F411 Generalized anxiety disorder: Secondary | ICD-10-CM | POA: Diagnosis not present

## 2016-07-31 ENCOUNTER — Encounter (INDEPENDENT_AMBULATORY_CARE_PROVIDER_SITE_OTHER): Payer: Self-pay | Admitting: Family Medicine

## 2016-07-31 ENCOUNTER — Ambulatory Visit (INDEPENDENT_AMBULATORY_CARE_PROVIDER_SITE_OTHER): Payer: 59 | Admitting: Family Medicine

## 2016-07-31 VITALS — BP 100/68 | HR 75 | Temp 98.2°F | Ht 65.0 in | Wt 242.0 lb

## 2016-07-31 DIAGNOSIS — E119 Type 2 diabetes mellitus without complications: Secondary | ICD-10-CM | POA: Diagnosis not present

## 2016-07-31 DIAGNOSIS — Z6841 Body Mass Index (BMI) 40.0 and over, adult: Secondary | ICD-10-CM

## 2016-07-31 DIAGNOSIS — Z9189 Other specified personal risk factors, not elsewhere classified: Secondary | ICD-10-CM

## 2016-07-31 DIAGNOSIS — E669 Obesity, unspecified: Secondary | ICD-10-CM | POA: Diagnosis not present

## 2016-07-31 DIAGNOSIS — IMO0001 Reserved for inherently not codable concepts without codable children: Secondary | ICD-10-CM

## 2016-07-31 MED ORDER — LIRAGLUTIDE 18 MG/3ML ~~LOC~~ SOPN: 1.8000 mg | PEN_INJECTOR | SUBCUTANEOUS | 0 refills | Status: DC

## 2016-08-01 NOTE — Progress Notes (Signed)
Office: [phone]  /  Fax: [phone]   HPI:   Chief Complaint: OBESITY Jasmine Martinez is here to discuss her progress with her obesity treatment plan. She is on the  keep a food journal with 1300 to 1600 calories and 80+ grams of protein daily and is following her eating plan approximately 75 % of the time. She states she is exercising 0 minutes 0 times per week. Jasmine Martinez has done better with weight loss efforts. She is doing better cooking at home. She still notes sabotage from husband. Jasmine Martinez is bored with plan and would like to discuss other options. Her weight is 242 lb (109.8 kg) today and has had a weight loss of 1 pound over a period of 1 week since her last visit. She has lost 18 lbs since starting treatment with [place].  Diabetes II Jasmine Martinez has a diagnosis of diabetes type II, well controlled on Victoza. Jasmine Martinez denies any hypoglycemic episodes. Last A1c was at 5.5 She has been working on intensive lifestyle modifications including diet, exercise, and weight loss to help control her blood glucose levels.  At risk for cardiovascular disease Jasmine Martinez is at a higher than average risk for cardiovascular disease due to obesity and diabetes. She currently denies any chest pain.   ALLERGIES: Allergies  Allergen Reactions  . Codeine Shortness Of Breath  . Hydrocodone Shortness Of Breath    Mild SOB    MEDICATIONS: Current Outpatient Prescriptions on File Prior to Visit  Medication Sig Dispense Refill  . ACCU-CHEK FASTCLIX LANCETS MISC 1 Bottle by Does not apply route every morning. 100 each 0  . atorvastatin (LIPITOR) 10 MG tablet Take 1 tablet (10 mg total) by mouth daily. 30 tablet 0  . buPROPion (WELLBUTRIN SR) 200 MG 12 hr tablet Take 1 tablet (200 mg total) by mouth daily. 30 tablet 0  . ciprofloxacin (CIPRO) 500 MG tablet Take 1 tablet (500 mg total) by mouth 2 (two) times daily. 14 tablet 0  . glucose blood (ACCU-CHEK GUIDE) test strip Use as instructed 100 each 0  .  levonorgestrel (MIRENA) 20 MCG/24HR IUD 1 each by Intrauterine route once.    . Melatonin 10 MG TABS Take 1 tablet by mouth at bedtime. 30 tablet 0  . NOVOFINE 32G X 6 MM MISC Inject 1 Stick as directed daily. 90 each 5  . TRUE METRIX BLOOD GLUCOSE TEST test strip 1 each by Other route daily. 1 stick daily 100 each 0  . TRUEPLUS LANCETS 30G MISC Inject 1 Stick as directed daily. 100 each 0  . Vitamin D, Ergocalciferol, (DRISDOL) 50000 units CAPS capsule Take 1 capsule (50,000 Units total) by mouth every 7 (seven) days. 4 capsule 0   No current facility-administered medications on file prior to visit.     PAST MEDICAL HISTORY: Past Medical History:  Diagnosis Date  . Anxiety   . Back pain   . Chest pain   . Depression   . Diabetes mellitus   . Diarrhea   . Edema    feet and legs  . Gallbladder problem   . 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 GERD (gastroesophageal reflux disease)   . Heartburn   . HTN (hypertension)   . Hyperlipidemia   . Obesity   . Palpitations   . PMDD (premenstrual dysphoric disorder) 01/02/2016  . Shortness of breath   . Vitamin D deficiency   . Vitamin D deficiency     PAST SURGICAL HISTORY: Past Surgical History:  Procedure Laterality Date  . APPENDECTOMY    . CHOLECYSTECTOMY    . EUS N/A 03/23/2014   Procedure: ESOPHAGEAL ENDOSCOPIC ULTRASOUND (EUS) RADIAL;  Surgeon: Arta Silence, MD;  Location: [facility];  Service: Endoscopy;  Laterality: N/A;  . FINE NEEDLE ASPIRATION N/A 03/23/2014   Procedure: FINE NEEDLE ASPIRATION (FNA) LINEAR;  Surgeon: Arta Silence, MD;  Location: [facility];  Service: Endoscopy;  Laterality: N/A;  . TONSILLECTOMY      SOCIAL HISTORY: Social History  Substance Use Topics  . Smoking status: Never Smoker  . Smokeless tobacco: Never Used  . Alcohol use No    FAMILY HISTORY: Family History  Problem Relation Age of Onset  . Healthy Mother   . Hypertension Mother   . Hyperlipidemia  Mother   . Alcoholism Mother   . Anxiety disorder Mother   . Drug abuse Mother   . Healthy Father   . Colon cancer Paternal Grandfather 50       colon    ROS: Review of Systems  Constitutional: Positive for weight loss.  Cardiovascular: Negative for chest pain.    PHYSICAL EXAM: Blood pressure 100/68, pulse 75, temperature 98.2 F (36.8 C), temperature source Oral, height 5\' 5"  (1.651 m), weight 242 lb (109.8 kg), SpO2 98 %. Body mass index is 40.27 kg/m. Physical Exam  Constitutional: She is oriented to person, place, and time. She appears well-developed and well-nourished.  Cardiovascular: Normal rate.   Pulmonary/Chest: Effort normal.  Musculoskeletal: Normal range of motion.  Neurological: She is oriented to person, place, and time.  Skin: Skin is warm and dry.  Psychiatric: She has a normal mood and affect. Her behavior is normal.  Vitals reviewed.   RECENT LABS AND TESTS: BMET    Component Value Date/Time   NA 140 04/18/2016 1201   K 4.3 04/18/2016 1201   CL 101 04/18/2016 1201   CO2 23 04/18/2016 1201   GLUCOSE 100 (H) 04/18/2016 1201   GLUCOSE 99 09/26/2014 2012   BUN 7 04/18/2016 1201   CREATININE 0.72 04/18/2016 1201   CREATININE 0.65 05/05/2014 0902   CALCIUM 9.7 04/18/2016 1201   GFRNONAA 103 04/18/2016 1201   GFRNONAA >89 05/05/2014 0902   GFRAA 119 04/18/2016 1201   GFRAA >89 05/05/2014 0902   Lab Results  Component Value Date   HGBA1C 5.5 04/18/2016   HGBA1C 6.3 (H) 01/02/2016   HGBA1C WILL FOLLOW 01/02/2016   HGBA1C 6.3 (H) 08/24/2015   HGBA1C 6 02/14/2015   Lab Results  Component Value Date   INSULIN 21.8 04/18/2016   INSULIN 15.3 01/02/2016   INSULIN WILL FOLLOW 01/02/2016   CBC    Component Value Date/Time   WBC 9.2 04/18/2016 1201   WBC 10.7 (H) 09/26/2014 2012   RBC 4.50 04/18/2016 1201   RBC 4.53 09/26/2014 2012   HGB 13.7 04/18/2016 1201   HCT 40.8 04/18/2016 1201   PLT 392 (H) 04/18/2016 1201   MCV 91 04/18/2016 1201    MCH 30.4 04/18/2016 1201   MCH 30.0 09/26/2014 2012   MCHC 33.6 04/18/2016 1201   MCHC 34.0 09/26/2014 2012   RDW 12.8 04/18/2016 1201   LYMPHSABS 2.4 04/18/2016 1201   MONOABS 0.6 05/05/2014 0902   EOSABS 0.1 04/18/2016 1201   BASOSABS 0.0 04/18/2016 1201   Iron/TIBC/Ferritin/ %Sat No results found for: IRON, TIBC, FERRITIN, IRONPCTSAT Lipid Panel     Component Value Date/Time   CHOL 140 04/18/2016 1201   TRIG 92 04/18/2016 1201   HDL 46 04/18/2016  1201   CHOLHDL 3.4 05/05/2014 0902   VLDL 16 05/05/2014 0902   LDLCALC 76 04/18/2016 1201   Hepatic Function Panel     Component Value Date/Time   PROT 7.8 04/18/2016 1201   ALBUMIN 4.5 04/18/2016 1201   AST 17 04/18/2016 1201   ALT 25 04/18/2016 1201   ALKPHOS 116 04/18/2016 1201   BILITOT 0.5 04/18/2016 1201      Component Value Date/Time   TSH 2.090 04/18/2016 1201   TSH 2.940 01/02/2016 1503   TSH 0.019 (L) 01/02/2016 1449    ASSESSMENT AND PLAN: Type 2 diabetes mellitus without complication, without long-term current use of insulin (HCC) - Plan: liraglutide (VICTOZA) 18 MG/3ML SOPN  At risk for heart disease  Class 3 obesity without serious comorbidity with body mass index (BMI) of 40.0 to 44.9 in adult, unspecified obesity type ([place])  PLAN:  Diabetes II Jasmine Martinez has been given extensive diabetes education by myself today including ideal fasting and post-prandial blood glucose readings, individual ideal Hgb A1c goals  and hypoglycemia prevention. We discussed the importance of good blood sugar control to decrease the likelihood of diabetic complications such as nephropathy, neuropathy, limb loss, blindness, coronary artery disease, and death. We discussed the importance of intensive lifestyle modification including diet, exercise and weight loss as the first line treatment for diabetes. Jasmine Martinez agrees to continue Victoza, we will refill for 1 month and she will follow up at the agreed upon time.  Cardiovascular  risk counselling Jasmine Martinez was given extended (15 minutes) coronary artery disease prevention counseling today. She is 46 y.o. female and has risk factors for heart disease including obesity and diabetes. We discussed intensive lifestyle modifications today with an emphasis on specific weight loss instructions and strategies. Pt was also informed of the importance of increasing exercise and decreasing saturated fats to help prevent heart disease.  Obesity Jasmine Martinez is currently in the action stage of change. As such, her goal is to continue with weight loss efforts She has agreed to follow the Paleo eating plan Jasmine Martinez has been instructed to work up to a goal of 150 minutes of combined cardio and strengthening exercise per week for weight loss and overall health benefits. We discussed the following Behavioral Modification Strategies today: better snacking choices, increasing lean protein intake and emotional eating strategies  Jasmine Martinez has agreed to follow up with our clinic in 1 weeks. She was informed of the importance of frequent follow up visits to maximize her success with intensive lifestyle modifications for her multiple health conditions.  I, Doreene Nest, am acting as transcriptionist for Dennard Nip, MD  I have reviewed the above documentation for accuracy and completeness, and I agree with the above. -Dennard Nip, MD  OBESITY BEHAVIORAL INTERVENTION VISIT  Today's visit was # 20 out of 55.  Starting weight: 260 lbs Starting date: 01/02/16 Today's weight : 242 lbs Today's date: 07/31/2016 Total lbs lost to date: 72 (Patients must lose 7 lbs in the first 6 months to continue with counseling)   ASK: We discussed the diagnosis of obesity with Jasmine Martinez today and Jasmine Martinez agreed to give [place] permission to discuss obesity behavioral modification therapy today.  ASSESS: Jasmine Martinez has the diagnosis of obesity and her BMI today is 40.4 Jasmine Martinez is in the action stage of  change   ADVISE: Jasmine Martinez was educated on the multiple health risks of obesity as well as the benefit of weight loss to improve her health. She was advised of the need for long term  treatment and the importance of lifestyle modifications.  AGREE: Multiple dietary modification options and treatment options were discussed and  Jasmine Martinez agreed to follow the Paleo eating plan We discussed the following Behavioral Modification Strategies today: better snacking choices, increasing lean protein intake and emotional eating strategies

## 2016-08-07 DIAGNOSIS — F411 Generalized anxiety disorder: Secondary | ICD-10-CM | POA: Diagnosis not present

## 2016-08-12 ENCOUNTER — Ambulatory Visit (INDEPENDENT_AMBULATORY_CARE_PROVIDER_SITE_OTHER): Payer: 59 | Admitting: Family Medicine

## 2016-08-12 VITALS — BP 116/75 | HR 73 | Temp 98.1°F | Ht 65.0 in | Wt 245.0 lb

## 2016-08-12 DIAGNOSIS — E119 Type 2 diabetes mellitus without complications: Secondary | ICD-10-CM

## 2016-08-12 DIAGNOSIS — F3289 Other specified depressive episodes: Secondary | ICD-10-CM | POA: Diagnosis not present

## 2016-08-12 DIAGNOSIS — IMO0001 Reserved for inherently not codable concepts without codable children: Secondary | ICD-10-CM

## 2016-08-12 DIAGNOSIS — Z6841 Body Mass Index (BMI) 40.0 and over, adult: Secondary | ICD-10-CM | POA: Diagnosis not present

## 2016-08-12 DIAGNOSIS — E669 Obesity, unspecified: Secondary | ICD-10-CM

## 2016-08-12 MED ORDER — BUPROPION HCL ER (SR) 200 MG PO TB12: 200.0000 mg | ORAL_TABLET | Freq: Every day | ORAL | 0 refills | Status: DC

## 2016-08-12 MED ORDER — LIRAGLUTIDE -WEIGHT MANAGEMENT 18 MG/3ML ~~LOC~~ SOPN: 3.0000 mg | PEN_INJECTOR | Freq: Every day | SUBCUTANEOUS | 0 refills | Status: DC

## 2016-08-12 NOTE — Progress Notes (Signed)
Office: [phone]  /  Fax: [phone]   HPI:   Chief Complaint: OBESITY Jasmine Martinez is here to discuss her progress with her obesity treatment plan. She is on the  follow the Paleo eating plan and is following her eating plan approximately 50 % of the time. She states she is exercising 0 minutes 0 times per week. Ardeth had increased celebration eating for her birthday. She is ready to get back on track. Hunger is still an issue and she has had increased stress at home with increased stress eating. Her weight is 245 lb (111.1 kg) today and has had a weight gain of 3 pounds over a period of 2 weeks since her last visit. She has lost 15 lbs since starting treatment with [place].  Diabetes II [name] has a diagnosis of diabetes type II. She is doing well on victoza but she still notes polyphagia. [name] denies any hypoglycemic episodes, nausea or vomiting. She has been working on intensive lifestyle modifications including diet, exercise, and weight loss to help control her blood glucose levels.  Depression with emotional eating behaviors [name] notes increased family stress in addition to work stress. She felt she dealt with it better due to her Wellbutrin. She has no insomnia and her blood pressure is stable. [name] struggles with emotional eating and using food for comfort to the extent that it is negatively impacting her health. She often snacks when she is not hungry. [name] sometimes feels she is out of control and then feels guilty that she made poor food choices. She has been working on behavior modification techniques to help reduce her emotional eating and has been somewhat successful. She shows no sign of suicidal or homicidal ideations.  Depression screen [name] 2/9 04/23/2016 01/02/2016 02/14/2015 06/23/2014  Decreased Interest 0 1 0 1  Down, Depressed, Hopeless 0 1 0 1  PHQ - 2 Score 0 2 0 2  Altered sleeping - 2 - 1  Tired, decreased energy - 3 - 1  Change in appetite - 3 - 0    Feeling bad or failure about yourself  - 3 - 0  Trouble concentrating - 0 - 0  Moving slowly or fidgety/restless - 0 - 0  Suicidal thoughts - 0 - 0  PHQ-9 Score - 13 - 4  Difficult doing work/chores - - - Not difficult at all     ALLERGIES: Allergies  Allergen Reactions   Codeine Shortness Of Breath   Hydrocodone Shortness Of Breath    Mild SOB    MEDICATIONS: Current Outpatient Prescriptions on File Prior to Visit  Medication Sig Dispense Refill   ACCU-CHEK FASTCLIX LANCETS MISC 1 Bottle by Does not apply route every morning. 100 each 0   atorvastatin (LIPITOR) 10 MG tablet Take 1 tablet (10 mg total) by mouth daily. 30 tablet 0   ciprofloxacin (CIPRO) 500 MG tablet Take 1 tablet (500 mg total) by mouth 2 (two) times daily. 14 tablet 0   glucose blood (ACCU-CHEK GUIDE) test strip Use as instructed 100 each 0   levonorgestrel (MIRENA) 20 MCG/24HR IUD 1 each by Intrauterine route once.     Melatonin 10 MG TABS Take 1 tablet by mouth at bedtime. 30 tablet 0   NOVOFINE 32G X 6 MM MISC Inject 1 Stick as directed daily. 90 each 5   TRUE METRIX BLOOD GLUCOSE TEST test strip 1 each by Other route daily. 1 stick daily 100 each 0   TRUEPLUS LANCETS 30G MISC Inject 1 Stick as directed  daily. 100 each 0   Vitamin D, Ergocalciferol, (DRISDOL) 50000 units CAPS capsule Take 1 capsule (50,000 Units total) by mouth every 7 (seven) days. 4 capsule 0   No current facility-administered medications on file prior to visit.     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   GERD (gastroesophageal reflux disease)    Heartburn    HTN (hypertension)    Hyperlipidemia    Obesity    Palpitations    PMDD (premenstrual dysphoric disorder) 01/02/2016    Shortness of breath    Vitamin D deficiency    Vitamin D deficiency     PAST SURGICAL HISTORY: Past Surgical History:  Procedure Laterality Date   APPENDECTOMY     CHOLECYSTECTOMY     EUS N/A 03/23/2014   Procedure: ESOPHAGEAL ENDOSCOPIC ULTRASOUND (EUS) RADIAL;  Surgeon: Jasmine Silence, MD;  Location: [facility];  Service: Endoscopy;  Laterality: N/A;   FINE NEEDLE ASPIRATION N/A 03/23/2014   Procedure: FINE NEEDLE ASPIRATION (FNA) LINEAR;  Surgeon: Jasmine Silence, MD;  Location: [facility];  Service: Endoscopy;  Laterality: N/A;   TONSILLECTOMY      SOCIAL HISTORY: Social History  Substance Use Topics   Smoking status: Never Smoker   Smokeless tobacco: Never Used   Alcohol use No    FAMILY HISTORY: Family History  Problem Relation Age of Onset   Healthy Mother    Hypertension Mother    Hyperlipidemia Mother    Alcoholism Mother    Anxiety disorder Mother    Drug abuse Mother    Healthy Father    Colon cancer Paternal Grandfather 23       colon    ROS: Review of Systems  Constitutional: Negative for weight loss.  Gastrointestinal: Negative for nausea and vomiting.  Endo/Heme/Allergies:       Polyphagia Negative hypoglycemia  Psychiatric/Behavioral: Positive for depression. Negative for suicidal ideas.       Stress    PHYSICAL EXAM: Blood pressure 116/75, pulse 73, temperature 98.1 F (36.7 C), temperature source Oral, height 5\' 5"  (1.651 m), weight 245 lb (111.1 kg), SpO2 100 %. Body mass index is 40.77 kg/m. Physical Exam  Constitutional: She is oriented to person, place, and time. She appears well-developed and well-nourished.  Cardiovascular: Normal rate.   Pulmonary/Chest: Effort normal.  Musculoskeletal: Normal range of motion.  Neurological: She is oriented to person, place, and time.  Skin: Skin is warm and dry.  Vitals reviewed.   RECENT LABS AND TESTS: BMET    Component Value Date/Time   NA 140 04/18/2016 1201   K 4.3  04/18/2016 1201   CL 101 04/18/2016 1201   CO2 23 04/18/2016 1201   GLUCOSE 100 (H) 04/18/2016 1201   GLUCOSE 99 09/26/2014 2012   BUN 7 04/18/2016 1201   CREATININE 0.72 04/18/2016 1201   CREATININE 0.65 05/05/2014 0902   CALCIUM 9.7 04/18/2016 1201   GFRNONAA 103 04/18/2016 1201   GFRNONAA >89 05/05/2014 0902   GFRAA 119 04/18/2016 1201   GFRAA >89 05/05/2014 0902   Lab Results  Component Value Date   HGBA1C 5.5 04/18/2016   HGBA1C 6.3 (H) 01/02/2016   HGBA1C WILL FOLLOW 01/02/2016  HGBA1C 6.3 (H) 08/24/2015   HGBA1C 6 02/14/2015   Lab Results  Component Value Date   INSULIN 21.8 04/18/2016   INSULIN 15.3 01/02/2016   INSULIN WILL FOLLOW 01/02/2016   CBC    Component Value Date/Time   WBC 9.2 04/18/2016 1201   WBC 10.7 (H) 09/26/2014 2012   RBC 4.50 04/18/2016 1201   RBC 4.53 09/26/2014 2012   HGB 13.7 04/18/2016 1201   HCT 40.8 04/18/2016 1201   PLT 392 (H) 04/18/2016 1201   MCV 91 04/18/2016 1201   MCH 30.4 04/18/2016 1201   MCH 30.0 09/26/2014 2012   MCHC 33.6 04/18/2016 1201   MCHC 34.0 09/26/2014 2012   RDW 12.8 04/18/2016 1201   LYMPHSABS 2.4 04/18/2016 1201   MONOABS 0.6 05/05/2014 0902   EOSABS 0.1 04/18/2016 1201   BASOSABS 0.0 04/18/2016 1201   Iron/TIBC/Ferritin/ %Sat No results found for: IRON, TIBC, FERRITIN, IRONPCTSAT Lipid Panel     Component Value Date/Time   CHOL 140 04/18/2016 1201   TRIG 92 04/18/2016 1201   HDL 46 04/18/2016 1201   CHOLHDL 3.4 05/05/2014 0902   VLDL 16 05/05/2014 0902   LDLCALC 76 04/18/2016 1201   Hepatic Function Panel     Component Value Date/Time   PROT 7.8 04/18/2016 1201   ALBUMIN 4.5 04/18/2016 1201   AST 17 04/18/2016 1201   ALT 25 04/18/2016 1201   ALKPHOS 116 04/18/2016 1201   BILITOT 0.5 04/18/2016 1201      Component Value Date/Time   TSH 2.090 04/18/2016 1201   TSH 2.940 01/02/2016 1503   TSH 0.019 (L) 01/02/2016 1449    ASSESSMENT AND PLAN: Type 2 diabetes mellitus without  complication, without long-term current use of insulin (HCC)  Other depression - Plan: buPROPion (WELLBUTRIN SR) 200 MG 12 hr tablet  Class 3 obesity with serious comorbidity and body mass index (BMI) of 40.0 to 44.9 in adult, unspecified obesity type ([place]) - Plan: Liraglutide -Weight Management ([place]) 18 MG/3ML SOPN  PLAN:  Diabetes II Jizelle has been given extensive diabetes education by myself today including ideal fasting and post-prandial blood glucose readings, individual ideal Hgb A1c goals  and hypoglycemia prevention. We discussed the importance of good blood sugar control to decrease the likelihood of diabetic complications such as nephropathy, neuropathy, limb loss, blindness, coronary artery disease, and death. We discussed the importance of intensive lifestyle modification including diet, exercise and weight loss as the first line treatment for diabetes. Jaisha agrees to discontinue Victoza and will continue with diet and exercise. She will be starting Saxenda 2.4 and will follow up at the agreed upon time.  Depression with Emotional Eating Behaviors We discussed behavior modification techniques today to help Rey deal with her emotional eating and depression. She has agreed to continue to take Wellbutrin SR 200 mg qd and will follow up as directed.  Obesity Dorian is currently in the action stage of change. As such, her goal is to continue with weight loss efforts She has agreed to follow the Paleo eating plan Kiasia has been instructed to work up to a goal of 150 minutes of combined cardio and strengthening exercise per week for weight loss and overall health benefits. We discussed the following Behavioral Modification Strategies today: meal planning & cooking strategies, emotional eating strategies and avoiding temptations We discussed various medication options to help Deeanna with her weight loss efforts and we both agreed to start Saxenda 3.0 mg #5 pens with no  refills  Jaxon has agreed  to follow up with our clinic in 2 weeks. She was informed of the importance of frequent follow up visits to maximize her success with intensive lifestyle modifications for her multiple health conditions.  I, Doreene Nest, am acting as transcriptionist for Dennard Nip, MD  I have reviewed the above documentation for accuracy and completeness, and I agree with the above. -Dennard Nip, MD  OBESITY BEHAVIORAL INTERVENTION VISIT  Today's visit was # 21 out of 22.  Starting weight: 260 lbs Starting date: 01/02/16 Today's weight : 245 lbs Today's date: 08/12/2016 Total lbs lost to date: 15 (Patients must lose 7 lbs in the first 6 months to continue with counseling)   ASK: We discussed the diagnosis of obesity with [name] today and Charyl agreed to give [place] permission to discuss obesity behavioral modification therapy today.  ASSESS: Mailynn has the diagnosis of obesity and her BMI today is 40.9 Jemia is in the action stage of change   ADVISE: Jerica was educated on the multiple health risks of obesity as well as the benefit of weight loss to improve her health. She was advised of the need for long term treatment and the importance of lifestyle modifications.  AGREE: Multiple dietary modification options and treatment options were discussed and  Charvi agreed to follow the Paleo eating plan We discussed the following Behavioral Modification Strategies today: meal planning & cooking strategies, emotional eating strategies and avoiding temptations

## 2016-08-13 DIAGNOSIS — F411 Generalized anxiety disorder: Secondary | ICD-10-CM | POA: Diagnosis not present

## 2016-08-20 DIAGNOSIS — F411 Generalized anxiety disorder: Secondary | ICD-10-CM | POA: Diagnosis not present

## 2016-08-21 ENCOUNTER — Ambulatory Visit (INDEPENDENT_AMBULATORY_CARE_PROVIDER_SITE_OTHER): Payer: 59 | Admitting: Family Medicine

## 2016-08-21 VITALS — BP 107/73 | HR 78 | Temp 97.9°F | Ht 65.0 in | Wt 235.0 lb

## 2016-08-21 DIAGNOSIS — E669 Obesity, unspecified: Secondary | ICD-10-CM

## 2016-08-21 DIAGNOSIS — IMO0001 Reserved for inherently not codable concepts without codable children: Secondary | ICD-10-CM

## 2016-08-21 DIAGNOSIS — E559 Vitamin D deficiency, unspecified: Secondary | ICD-10-CM | POA: Diagnosis not present

## 2016-08-21 DIAGNOSIS — Z6839 Body mass index (BMI) 39.0-39.9, adult: Secondary | ICD-10-CM

## 2016-08-21 DIAGNOSIS — Z9189 Other specified personal risk factors, not elsewhere classified: Secondary | ICD-10-CM | POA: Diagnosis not present

## 2016-08-21 MED ORDER — VITAMIN D (ERGOCALCIFEROL) 1.25 MG (50000 UNIT) PO CAPS: 50000.0000 [IU] | ORAL_CAPSULE | ORAL | 0 refills | Status: DC

## 2016-08-21 MED FILL — VIT D2 1.25 MG (50,000 UNIT: 1.25 MG | 28 days supply | Qty: 4 | Fill #0

## 2016-08-22 ENCOUNTER — Ambulatory Visit (INDEPENDENT_AMBULATORY_CARE_PROVIDER_SITE_OTHER): Payer: 59 | Admitting: Family Medicine

## 2016-08-22 NOTE — Progress Notes (Signed)
Office: [phone]  /  Fax: [phone]   HPI:   Chief Complaint: OBESITY Jasmine Martinez is here to discuss her progress with her obesity treatment plan. She is on the  follow a lower carbohydrate, add cheese (no fruit) and lean protein rich diet plan and is following her eating plan approximately 100 % of the time. She states she is exercising 0 minutes 0 times per week. Jasmine Martinez has done very well with weight loss on Paleo. She struggled for the first two days but has done well with it since then. 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 She has done better with planning meals ahead of time. Her hunger is controlled while on the plan and has decreased eating out overall. Patient was started on Saxenda but insurance denied due to not being on an appropriate amphetamine. Patient states she tried these earlier with significant side effects. Patient was on Phentermine in the past and did not tolerate due to palpitations.    Her weight is 235 lb (106.6 kg) today and has had a weight loss of 10 pounds over a period of 2 weeks since her last visit. She has lost 25 lbs since starting treatment with [place].  Vitamin D deficiency Jasmine Martinez has a diagnosis of vitamin D deficiency. She is currently taking vit D and denies nausea, vomiting or muscle weakness.  At risk for cardiovascular disease Jasmine Martinez is at a higher than average risk for cardiovascular disease due to obesity. She currently denies any chest pain.     ALLERGIES: Allergies  Allergen Reactions  . Codeine Shortness Of Breath  . Hydrocodone Shortness Of Breath    Mild SOB    MEDICATIONS: Current Outpatient Prescriptions on File Prior to Visit  Medication Sig Dispense Refill  . ACCU-CHEK FASTCLIX LANCETS MISC 1 Bottle by Does not apply route every morning. 100 each 0  . atorvastatin (LIPITOR) 10 MG tablet Take 1 tablet (10 mg total) by mouth daily. 30 tablet 0  . buPROPion (WELLBUTRIN SR) 200 MG 12 hr tablet Take 1 tablet (200 mg total) by  mouth daily. 30 tablet 0  . glucose blood (ACCU-CHEK GUIDE) test strip Use as instructed 100 each 0  . levonorgestrel (MIRENA) 20 MCG/24HR IUD 1 each by Intrauterine route once.    . Liraglutide -Weight Management (SAXENDA) 18 MG/3ML SOPN Inject 3 mg into the skin daily. 5 pen 0  . Melatonin 10 MG TABS Take 1 tablet by mouth at bedtime. 30 tablet 0  . NOVOFINE 32G X 6 MM MISC Inject 1 Stick as directed daily. 90 each 5  . TRUE METRIX BLOOD GLUCOSE TEST test strip 1 each by Other route daily. 1 stick daily 100 each 0  . TRUEPLUS LANCETS 30G MISC Inject 1 Stick as directed daily. 100 each 0   No current facility-administered medications on file prior to visit.     PAST MEDICAL HISTORY: Past Medical History:  Diagnosis Date  . Anxiety   . Back pain   . Chest pain   . Depression   . Diabetes mellitus   . Diarrhea   . Edema    feet and legs  . Gallbladder problem   . 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 GERD (gastroesophageal reflux disease)   . Heartburn   . HTN (hypertension)   . Hyperlipidemia   . Obesity   . Palpitations   . PMDD (premenstrual dysphoric disorder) 01/02/2016  . Shortness of breath   . Vitamin D deficiency   .  Vitamin D deficiency     PAST SURGICAL HISTORY: Past Surgical History:  Procedure Laterality Date  . APPENDECTOMY    . CHOLECYSTECTOMY    . EUS N/A 03/23/2014   Procedure: ESOPHAGEAL ENDOSCOPIC ULTRASOUND (EUS) RADIAL;  Surgeon: Arta Silence, MD;  Location: [facility];  Service: Endoscopy;  Laterality: N/A;  . FINE NEEDLE ASPIRATION N/A 03/23/2014   Procedure: FINE NEEDLE ASPIRATION (FNA) LINEAR;  Surgeon: Arta Silence, MD;  Location: [facility];  Service: Endoscopy;  Laterality: N/A;  . TONSILLECTOMY      SOCIAL HISTORY: Social History  Substance Use Topics  . Smoking status: Never Smoker  . Smokeless tobacco: Never Used  . Alcohol use No    FAMILY HISTORY: Family History  Problem Relation Age of Onset  .  Healthy Mother   . Hypertension Mother   . Hyperlipidemia Mother   . Alcoholism Mother   . Anxiety disorder Mother   . Drug abuse Mother   . Healthy Father   . Colon cancer Paternal Grandfather 18       colon    ROS: Review of Systems  Constitutional: Positive for weight loss.  All other systems reviewed and are negative.   PHYSICAL EXAM: Blood pressure 107/73, pulse 78, temperature 97.9 F (36.6 C), temperature source Oral, height 5\' 5"  (1.651 m), weight 235 lb (106.6 kg), SpO2 99 %. Body mass index is 39.11 kg/m. Physical Exam  Constitutional: She is oriented to person, place, and time. She appears well-developed and well-nourished.  HENT:  Head: Normocephalic.  Eyes: EOM are normal.  Neck: Normal range of motion.  Pulmonary/Chest: Effort normal.  Musculoskeletal: Normal range of motion.  Neurological: She is alert and oriented to person, place, and time.  Psychiatric: She has a normal mood and affect.  Vitals reviewed.   RECENT LABS AND TESTS: BMET    Component Value Date/Time   NA 140 04/18/2016 1201   K 4.3 04/18/2016 1201   CL 101 04/18/2016 1201   CO2 23 04/18/2016 1201   GLUCOSE 100 (H) 04/18/2016 1201   GLUCOSE 99 09/26/2014 2012   BUN 7 04/18/2016 1201   CREATININE 0.72 04/18/2016 1201   CREATININE 0.65 05/05/2014 0902   CALCIUM 9.7 04/18/2016 1201   GFRNONAA 103 04/18/2016 1201   GFRNONAA >89 05/05/2014 0902   GFRAA 119 04/18/2016 1201   GFRAA >89 05/05/2014 0902   Lab Results  Component Value Date   HGBA1C 5.5 04/18/2016   HGBA1C 6.3 (H) 01/02/2016   HGBA1C WILL FOLLOW 01/02/2016   HGBA1C 6.3 (H) 08/24/2015   HGBA1C 6 02/14/2015   Lab Results  Component Value Date   INSULIN 21.8 04/18/2016   INSULIN 15.3 01/02/2016   INSULIN WILL FOLLOW 01/02/2016   CBC    Component Value Date/Time   WBC 9.2 04/18/2016 1201   WBC 10.7 (H) 09/26/2014 2012   RBC 4.50 04/18/2016 1201   RBC 4.53 09/26/2014 2012   HGB 13.7 04/18/2016 1201   HCT 40.8  04/18/2016 1201   PLT 392 (H) 04/18/2016 1201   MCV 91 04/18/2016 1201   MCH 30.4 04/18/2016 1201   MCH 30.0 09/26/2014 2012   MCHC 33.6 04/18/2016 1201   MCHC 34.0 09/26/2014 2012   RDW 12.8 04/18/2016 1201   LYMPHSABS 2.4 04/18/2016 1201   MONOABS 0.6 05/05/2014 0902   EOSABS 0.1 04/18/2016 1201   BASOSABS 0.0 04/18/2016 1201   Iron/TIBC/Ferritin/ %Sat No results found for: IRON, TIBC, FERRITIN, IRONPCTSAT Lipid Panel     Component Value Date/Time  CHOL 140 04/18/2016 1201   TRIG 92 04/18/2016 1201   HDL 46 04/18/2016 1201   CHOLHDL 3.4 05/05/2014 0902   VLDL 16 05/05/2014 0902   LDLCALC 76 04/18/2016 1201   Hepatic Function Panel     Component Value Date/Time   PROT 7.8 04/18/2016 1201   ALBUMIN 4.5 04/18/2016 1201   AST 17 04/18/2016 1201   ALT 25 04/18/2016 1201   ALKPHOS 116 04/18/2016 1201   BILITOT 0.5 04/18/2016 1201      Component Value Date/Time   TSH 2.090 04/18/2016 1201   TSH 2.940 01/02/2016 1503   TSH 0.019 (L) 01/02/2016 1449    ASSESSMENT AND PLAN: Vitamin D deficiency - Plan: Vitamin D, Ergocalciferol, (DRISDOL) 50000 units CAPS capsule  At risk for heart disease  Class 2 obesity with serious comorbidity and body mass index (BMI) of 39.0 to 39.9 in adult, unspecified obesity type  PLAN: Vitamin D Deficiency Jasmine Martinez was informed that low vitamin D levels contributes to fatigue and are associated with obesity, breast, and colon cancer. She agrees to continue to take prescription Vit D @50 ,000 IU every week and will follow up for routine testing of vitamin D, at least 2-3 times per year. She was informed of the risk of over-replacement of vitamin D and agrees to not increase her dose unless he discusses this with [place] first. A prescription was written today for the patient with # 30 and no refills.   Cardiovascular risk counselling Jasmine Martinez was given extended (15 minutes) coronary artery disease prevention counseling today. She is 46 y.o. female  and has risk factors for heart disease including obesity. We discussed intensive lifestyle modifications today with an emphasis on specific weight loss instructions and strategies. Pt was also informed of the importance of increasing exercise and decreasing saturated fats to help prevent heart disease.  Obesity Jasmine Martinez is currently in the action stage of change. As such, her goal is to continue with weight loss efforts She has agreed to follow a lower carbohydrate, add cheese (no fruit) vegetable and lean protein rich diet plan Jasmine Martinez has been instructed to work up to a goal of 150 minutes of combined cardio and strengthening exercise per week for weight loss and overall health benefits. Start Saxenda 30.mg Qam #5 with 0 refills. Restart prior authorization for Saxenda and follow up.  We discussed the following Behavioral Modification Stratagies today: increasing lean protein intake and decreasing simple carbohydrates   Jasmine Martinez has agreed to follow up with our clinic in 2 weeks. She was informed of the importance of frequent follow up visits to maximize her success with intensive lifestyle modifications for her multiple health conditions.  I, Carols Clemence , am acting as Location manager for Dennard Nip, MD  I have reviewed the above documentation for accuracy and completeness, and I agree with the above. -Dennard Nip, MD

## 2016-08-25 ENCOUNTER — Encounter (INDEPENDENT_AMBULATORY_CARE_PROVIDER_SITE_OTHER): Payer: Self-pay | Admitting: Family Medicine

## 2016-08-26 ENCOUNTER — Ambulatory Visit (INDEPENDENT_AMBULATORY_CARE_PROVIDER_SITE_OTHER): Payer: 59 | Admitting: Physician Assistant

## 2016-08-26 NOTE — Telephone Encounter (Signed)
Can you assist with r/s [name]?   Thanks Yailine Ballard

## 2016-08-27 ENCOUNTER — Ambulatory Visit (INDEPENDENT_AMBULATORY_CARE_PROVIDER_SITE_OTHER): Payer: 59 | Admitting: Family Medicine

## 2016-08-27 DIAGNOSIS — F411 Generalized anxiety disorder: Secondary | ICD-10-CM | POA: Diagnosis not present

## 2016-08-29 ENCOUNTER — Ambulatory Visit (INDEPENDENT_AMBULATORY_CARE_PROVIDER_SITE_OTHER): Payer: 59 | Admitting: Physician Assistant

## 2016-08-29 VITALS — BP 101/70 | HR 81 | Temp 98.7°F | Ht 65.0 in | Wt 237.0 lb

## 2016-08-29 DIAGNOSIS — Z9189 Other specified personal risk factors, not elsewhere classified: Secondary | ICD-10-CM

## 2016-08-29 DIAGNOSIS — E119 Type 2 diabetes mellitus without complications: Secondary | ICD-10-CM | POA: Diagnosis not present

## 2016-08-29 DIAGNOSIS — E784 Other hyperlipidemia: Secondary | ICD-10-CM | POA: Diagnosis not present

## 2016-08-29 DIAGNOSIS — E7849 Other hyperlipidemia: Secondary | ICD-10-CM

## 2016-08-29 DIAGNOSIS — IMO0001 Reserved for inherently not codable concepts without codable children: Secondary | ICD-10-CM

## 2016-08-29 DIAGNOSIS — Z6839 Body mass index (BMI) 39.0-39.9, adult: Secondary | ICD-10-CM

## 2016-08-29 DIAGNOSIS — F3289 Other specified depressive episodes: Secondary | ICD-10-CM | POA: Diagnosis not present

## 2016-08-29 DIAGNOSIS — E669 Obesity, unspecified: Secondary | ICD-10-CM | POA: Diagnosis not present

## 2016-08-29 DIAGNOSIS — Z794 Long term (current) use of insulin: Secondary | ICD-10-CM

## 2016-08-29 MED ORDER — ACCU-CHEK FASTCLIX LANCETS MISC: 1.0000 | Freq: Every morning | 0 refills | Status: DC

## 2016-08-29 MED ORDER — ATORVASTATIN CALCIUM 10 MG PO TABS: 10.0000 mg | ORAL_TABLET | Freq: Every day | ORAL | 0 refills | Status: DC

## 2016-08-29 MED ORDER — BUPROPION HCL ER (SR) 200 MG PO TB12: 200.0000 mg | ORAL_TABLET | Freq: Every day | ORAL | 0 refills | Status: DC

## 2016-08-29 MED FILL — ATORVASTATIN 10 MG TABLET: 10 | 30 days supply | Qty: 30 | Fill #0

## 2016-08-29 NOTE — Progress Notes (Signed)
Office: [phone]  /  Fax: [phone]   HPI:   Chief Complaint: OBESITY Jasmine Martinez is here to discuss her progress with her obesity treatment plan. She is on the  follow a lower carbohydrate, vegetable and lean protein rich diet plan (Paleolithic) and is following her eating plan approximately 95 % of the time. She states she is exercising 0 minutes 0 times per week. Jasmine Martinez has had a busier work schedule and did not plan ahead as well. She continued to make smarter food choices and control her portions. She struggled with temptation and family sabotage at home.   Her weight is 237 lb (107.5 kg) today and she gained 2 pounds since her last visit. She has lost 23 lbs since starting treatment with [place].  Hyperlipidemia Jasmine Martinez has hyperlipidemia and has been trying to improve her cholesterol levels with intensive lifestyle modification including a low saturated fat diet, exercise and weight loss. She denies any chest pain, claudication or myalgias.  Diabetes II Jasmine Martinez has a diagnosis of diabetes type II. Jasmine Martinez states patient does not check sugars and denies any hypoglycemic episodes. Last A1c was 6.3 on 01/01/17.  She has been working on intensive lifestyle modifications including diet, exercise, and weight loss to help control her blood glucose levels.  Depression with emotional eating behaviors Jasmine Martinez is struggling with emotional eating and using food for comfort to the extent that it is negatively impacting her health. She often snacks when she is not hungry. Jasmine Martinez sometimes feels she is out of control and then feels guilty that she made poor food choices. She has been working on behavior modification techniques to help reduce her emotional eating and has been somewhat successful. She shows no sign of suicidal or homicidal ideations.  Depression screen Jasmine Martinez 2/9 04/23/2016 01/02/2016 02/14/2015 06/23/2014  Decreased Interest 0 1 0 1  Down, Depressed, Hopeless 0 1 0 1  PHQ - 2 Score  0 2 0 2  Altered sleeping - 2 - 1  Tired, decreased energy - 3 - 1  Change in appetite - 3 - 0  Feeling bad or failure about yourself  - 3 - 0  Trouble concentrating - 0 - 0  Moving slowly or fidgety/restless - 0 - 0  Suicidal thoughts - 0 - 0  PHQ-9 Score - 13 - 4  Difficult doing work/chores - - - Not difficult at all       ALLERGIES: Allergies  Allergen Reactions  . Codeine Shortness Of Breath  . Hydrocodone Shortness Of Breath    Mild SOB    MEDICATIONS: Current Outpatient Prescriptions on File Prior to Visit  Medication Sig Dispense Refill  . glucose blood (ACCU-CHEK GUIDE) test strip Use as instructed 100 each 0  . levonorgestrel (MIRENA) 20 MCG/24HR IUD 1 each by Intrauterine route once.    . Liraglutide -Weight Management (SAXENDA) 18 MG/3ML SOPN Inject 3 mg into the skin daily. 5 pen 0  . Melatonin 10 MG TABS Take 1 tablet by mouth at bedtime. 30 tablet 0  . NOVOFINE 32G X 6 MM MISC Inject 1 Stick as directed daily. 90 each 5  . TRUE METRIX BLOOD GLUCOSE TEST test strip 1 each by Other route daily. 1 stick daily 100 each 0  . TRUEPLUS LANCETS 30G MISC Inject 1 Stick as directed daily. 100 each 0  . Vitamin D, Ergocalciferol, (DRISDOL) 50000 units CAPS capsule Take 1 capsule (50,000 Units total) by mouth every 7 (seven) days. 4 capsule 0   No current  facility-administered medications on file prior to visit.     PAST MEDICAL HISTORY: Past Medical History:  Diagnosis Date  . Anxiety   . Back pain   . Chest pain   . Depression   . Diabetes mellitus   . Diarrhea   . Edema    feet and legs  . Gallbladder problem   . 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 GERD (gastroesophageal reflux disease)   . Heartburn   . HTN (hypertension)   . Hyperlipidemia   . Obesity   . Palpitations   . PMDD (premenstrual dysphoric disorder) 01/02/2016  . Shortness of breath   . Vitamin D deficiency   . Vitamin D deficiency     PAST SURGICAL  HISTORY: Past Surgical History:  Procedure Laterality Date  . APPENDECTOMY    . CHOLECYSTECTOMY    . EUS N/A 03/23/2014   Procedure: ESOPHAGEAL ENDOSCOPIC ULTRASOUND (EUS) RADIAL;  Surgeon: Arta Silence, MD;  Location: [facility];  Service: Endoscopy;  Laterality: N/A;  . FINE NEEDLE ASPIRATION N/A 03/23/2014   Procedure: FINE NEEDLE ASPIRATION (FNA) LINEAR;  Surgeon: Arta Silence, MD;  Location: [facility];  Service: Endoscopy;  Laterality: N/A;  . TONSILLECTOMY      SOCIAL HISTORY: Social History  Substance Use Topics  . Smoking status: Never Smoker  . Smokeless tobacco: Never Used  . Alcohol use No    FAMILY HISTORY: Family History  Problem Relation Age of Onset  . Healthy Mother   . Hypertension Mother   . Hyperlipidemia Mother   . Alcoholism Mother   . Anxiety disorder Mother   . Drug abuse Mother   . Healthy Father   . Colon cancer Paternal Grandfather 25       colon    ROS: Review of Systems  Respiratory: Negative for shortness of breath.   Cardiovascular: Negative for chest pain.  Endo/Heme/Allergies:       Negative polyphagia  Psychiatric/Behavioral: Positive for depression. Negative for suicidal ideas.    PHYSICAL EXAM: Blood pressure 101/70, pulse 81, temperature 98.7 F (37.1 C), temperature source Oral, height 5\' 5"  (1.651 m), weight 237 lb (107.5 kg), SpO2 100 %. Body mass index is 39.44 kg/m. Physical Exam  Constitutional: She is oriented to person, place, and time. She appears well-developed and well-nourished.  Cardiovascular: Normal rate.   Pulmonary/Chest: Effort normal.  Musculoskeletal: Normal range of motion.  Neurological: She is alert and oriented to person, place, and time.  Skin: Skin is warm and dry.  Psychiatric: She has a normal mood and affect.    RECENT LABS AND TESTS: BMET    Component Value Date/Time   NA 140 04/18/2016 1201   K 4.3 04/18/2016 1201   CL 101 04/18/2016 1201   CO2 23 04/18/2016 1201   GLUCOSE 100 (H)  04/18/2016 1201   GLUCOSE 99 09/26/2014 2012   BUN 7 04/18/2016 1201   CREATININE 0.72 04/18/2016 1201   CREATININE 0.65 05/05/2014 0902   CALCIUM 9.7 04/18/2016 1201   GFRNONAA 103 04/18/2016 1201   GFRNONAA >89 05/05/2014 0902   GFRAA 119 04/18/2016 1201   GFRAA >89 05/05/2014 0902   Lab Results  Component Value Date   HGBA1C 5.5 04/18/2016   HGBA1C 6.3 (H) 01/02/2016   HGBA1C WILL FOLLOW 01/02/2016   HGBA1C 6.3 (H) 08/24/2015   HGBA1C 6 02/14/2015   Lab Results  Component Value Date   INSULIN 21.8 04/18/2016   INSULIN 15.3 01/02/2016   INSULIN WILL  FOLLOW 01/02/2016   CBC    Component Value Date/Time   WBC 9.2 04/18/2016 1201   WBC 10.7 (H) 09/26/2014 2012   RBC 4.50 04/18/2016 1201   RBC 4.53 09/26/2014 2012   HGB 13.7 04/18/2016 1201   HCT 40.8 04/18/2016 1201   PLT 392 (H) 04/18/2016 1201   MCV 91 04/18/2016 1201   MCH 30.4 04/18/2016 1201   MCH 30.0 09/26/2014 2012   MCHC 33.6 04/18/2016 1201   MCHC 34.0 09/26/2014 2012   RDW 12.8 04/18/2016 1201   LYMPHSABS 2.4 04/18/2016 1201   MONOABS 0.6 05/05/2014 0902   EOSABS 0.1 04/18/2016 1201   BASOSABS 0.0 04/18/2016 1201   Iron/TIBC/Ferritin/ %Sat No results found for: IRON, TIBC, FERRITIN, IRONPCTSAT Lipid Panel     Component Value Date/Time   CHOL 140 04/18/2016 1201   TRIG 92 04/18/2016 1201   HDL 46 04/18/2016 1201   CHOLHDL 3.4 05/05/2014 0902   VLDL 16 05/05/2014 0902   LDLCALC 76 04/18/2016 1201   Hepatic Function Panel     Component Value Date/Time   PROT 7.8 04/18/2016 1201   ALBUMIN 4.5 04/18/2016 1201   AST 17 04/18/2016 1201   ALT 25 04/18/2016 1201   ALKPHOS 116 04/18/2016 1201   BILITOT 0.5 04/18/2016 1201      Component Value Date/Time   TSH 2.090 04/18/2016 1201   TSH 2.940 01/02/2016 1503   TSH 0.019 (L) 01/02/2016 1449    ASSESSMENT AND PLAN: Other hyperlipidemia - Plan: atorvastatin (LIPITOR) 10 MG tablet  Type 2 diabetes mellitus without complication, with long-term  current use of insulin (HCC) - Plan: ACCU-CHEK FASTCLIX LANCETS MISC  Other depression - Plan: buPROPion (WELLBUTRIN SR) 200 MG 12 hr tablet  At risk for heart disease  Class 2 obesity with serious comorbidity and body mass index (BMI) of 39.0 to 39.9 in adult, unspecified obesity type  PLAN:  Hyperlipidemia Jasmine Martinez was informed of the American Heart Association Guidelines emphasizing intensive lifestyle modifications as the first line treatment for hyperlipidemia. We discussed many lifestyle modifications today in depth, and Jasmine Martinez will continue to work on decreasing saturated fats such as fatty red meat, butter and many fried foods. She will also increase vegetables and lean protein in her diet and continue to work on exercise and weight loss efforts. A refill of Lipitor 10 mg qd is written today.   Diabetes II Jasmine Martinez has been given extensive diabetes education by myself today including ideal fasting and post-prandial blood glucose readings, individual ideal HgA1c goals  and hypoglycemia prevention. We discussed the importance of good blood sugar control to decrease the likelihood of diabetic complications such as nephropathy, neuropathy, limb loss, blindness, coronary artery disease, and death. We discussed the importance of intensive lifestyle modification including diet, exercise and weight loss as the first line treatment for diabetes. Jasmine Martinez agrees to continue her diabetes medications, refill of lancets is written today, and will follow up at the agreed upon time.  Depression with Emotional Eating Behaviors We discussed behavior modification techniques today to help Jasmine Martinez deal with her emotional eating and depression. She has agreed to continue to take Wellbutrin SR 200 mg qd, refill is written today,  and agreed to follow up as directed.  Obesity Jasmine Martinez is currently in the action stage of change. As such, her goal is to continue with weight loss efforts She has agreed to  follow a lower carbohydrate, vegetable and lean protein rich diet plan (Paleolithic) Jasmine Martinez has been instructed to work up to  a goal of 150 minutes of combined cardio and strengthening exercise per week for weight loss and overall health benefits. We discussed the following Behavioral Modification Stratagies today: increasing lean protein intake and planning for success.    Office: [phone]  /  Fax: [phone]  OBESITY BEHAVIORAL INTERVENTION VISIT  Today's visit was # 23 out of 22.  Starting weight: 260 Starting date: 01/01/17 Today's weight : Weight: 237 lb (107.5 kg)  Today's date: 08/29/2016 Total lbs lost to date: 23 (Patients must lose 7 lbs in the first 6 months to continue with counseling)   ASK: We discussed the diagnosis of obesity with Jasmine Martinez today and Jasmine Martinez agreed to give [place] permission to discuss obesity behavioral modification therapy today.  ASSESS: Jasmine Martinez has the diagnosis of obesity and her BMI today is 39.5 Jasmine Martinez is in the action stage of change   ADVISE: Jasmine Martinez was educated on the multiple health risks of obesity as well as the benefit of weight loss to improve her health. She was advised of the need for long term treatment and the importance of lifestyle modifications.  AGREE: Multiple dietary modification options and treatment options were discussed and  Jasmine Martinez agreed to follow a lower carbohydrate, vegetable and lean protein rich diet plan (Paleolithic) We discussed the following Behavioral Modification Stratagies today: increasing lean protein intake and planning for success.    Jasmine Martinez has agreed to follow up with our clinic in 2 weeks. She was informed of the importance of frequent follow up visits to maximize her success with intensive lifestyle modifications for her multiple health conditions.   I have reviewed the above documentation for accuracy and completeness, and I agree with the above. -Lacy Duverney, PA-C  I have  reviewed the above note and agree with the plan. -Dennard Nip, MD

## 2016-09-02 MED FILL — ACCU-CHEK FASTCLIX LANCETS: 90 days supply | Qty: 102 | Fill #0

## 2016-09-03 DIAGNOSIS — F411 Generalized anxiety disorder: Secondary | ICD-10-CM | POA: Diagnosis not present

## 2016-09-04 MED FILL — BUPROPION HCL SR 200 MG TAB: 200 | 30 days supply | Qty: 30 | Fill #0

## 2016-09-10 DIAGNOSIS — F411 Generalized anxiety disorder: Secondary | ICD-10-CM | POA: Diagnosis not present

## 2016-09-12 ENCOUNTER — Ambulatory Visit (INDEPENDENT_AMBULATORY_CARE_PROVIDER_SITE_OTHER): Payer: 59 | Admitting: Family Medicine

## 2016-09-12 ENCOUNTER — Ambulatory Visit (INDEPENDENT_AMBULATORY_CARE_PROVIDER_SITE_OTHER): Payer: 59 | Admitting: Physician Assistant

## 2016-09-12 VITALS — BP 116/75 | HR 60 | Temp 98.4°F | Ht 65.0 in | Wt 231.0 lb

## 2016-09-12 DIAGNOSIS — E669 Obesity, unspecified: Secondary | ICD-10-CM | POA: Diagnosis not present

## 2016-09-12 DIAGNOSIS — Z9189 Other specified personal risk factors, not elsewhere classified: Secondary | ICD-10-CM | POA: Diagnosis not present

## 2016-09-12 DIAGNOSIS — E119 Type 2 diabetes mellitus without complications: Secondary | ICD-10-CM | POA: Diagnosis not present

## 2016-09-12 DIAGNOSIS — IMO0001 Reserved for inherently not codable concepts without codable children: Secondary | ICD-10-CM

## 2016-09-12 DIAGNOSIS — R059 Cough, unspecified: Secondary | ICD-10-CM

## 2016-09-12 DIAGNOSIS — R05 Cough: Secondary | ICD-10-CM

## 2016-09-12 DIAGNOSIS — Z6838 Body mass index (BMI) 38.0-38.9, adult: Secondary | ICD-10-CM

## 2016-09-12 NOTE — Progress Notes (Signed)
Office: [phone]  /  Fax: [phone]   HPI:   Chief Complaint: OBESITY Jasmine Martinez is here to discuss her progress with her obesity treatment plan. She is on the lower carbohydrate, vegetable and lean protein rich diet plan and Paleo eating plan and is following her eating plan approximately 100 % of the time. She states she is exercising 0 minutes 0 times per week. Jasmine Martinez has done well with weight loss on the low carb plan. She has done well with planning meals ahead of time. Cravings are controlled. Her weight is 231 lb (104.8 kg) today and has had a weight loss of 6 pounds over a period of 2 weeks since her last visit. She has lost 29 lbs since starting treatment with [place].  Diabetes II Jasmine Martinez has a diagnosis of diabetes type II and is on Victoza. Jasmine Martinez states fasting BGs range between 80 and 90's and denies any hypoglycemic episodes. She has been working on intensive lifestyle modifications including diet, exercise, and weight loss to help control her blood glucose levels.  At risk for cardiovascular disease Jasmine Martinez is at a higher than average risk for cardiovascular disease due to obesity and diabetes. She currently denies any chest pain.  Cough Jasmine Martinez has a lingering cough and is status post viral upper respiratory infection. She has been taking Nyquil but is still having a non productive cough.   ALLERGIES: Allergies  Allergen Reactions  . Codeine Shortness Of Breath  . Hydrocodone Shortness Of Breath    Mild SOB    MEDICATIONS: Current Outpatient Prescriptions on File Prior to Visit  Medication Sig Dispense Refill  . ACCU-CHEK FASTCLIX LANCETS MISC 1 Bottle by Does not apply route every morning. 100 each 0  . atorvastatin (LIPITOR) 10 MG tablet Take 1 tablet (10 mg total) by mouth daily. 30 tablet 0  . buPROPion (WELLBUTRIN SR) 200 MG 12 hr tablet Take 1 tablet (200 mg total) by mouth daily. 30 tablet 0  . glucose blood (ACCU-CHEK GUIDE) test strip Use as  instructed 100 each 0  . levonorgestrel (MIRENA) 20 MCG/24HR IUD 1 each by Intrauterine route once.    . Liraglutide -Weight Management (SAXENDA) 18 MG/3ML SOPN Inject 3 mg into the skin daily. 5 pen 0  . Melatonin 10 MG TABS Take 1 tablet by mouth at bedtime. 30 tablet 0  . NOVOFINE 32G X 6 MM MISC Inject 1 Stick as directed daily. 90 each 5  . TRUE METRIX BLOOD GLUCOSE TEST test strip 1 each by Other route daily. 1 stick daily 100 each 0  . TRUEPLUS LANCETS 30G MISC Inject 1 Stick as directed daily. 100 each 0  . Vitamin D, Ergocalciferol, (DRISDOL) 50000 units CAPS capsule Take 1 capsule (50,000 Units total) by mouth every 7 (seven) days. 4 capsule 0   No current facility-administered medications on file prior to visit.     PAST MEDICAL HISTORY: Past Medical History:  Diagnosis Date  . Anxiety   . Back pain   . Chest pain   . Depression   . Diabetes mellitus   . Diarrhea   . Edema    feet and legs  . Gallbladder problem   . 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 GERD (gastroesophageal reflux disease)   . Heartburn   . HTN (hypertension)   . Hyperlipidemia   . Obesity   . Palpitations   . PMDD (premenstrual dysphoric disorder) 01/02/2016  . Shortness of breath   . Vitamin  D deficiency   . Vitamin D deficiency     PAST SURGICAL HISTORY: Past Surgical History:  Procedure Laterality Date  . APPENDECTOMY    . CHOLECYSTECTOMY    . EUS N/A 03/23/2014   Procedure: ESOPHAGEAL ENDOSCOPIC ULTRASOUND (EUS) RADIAL;  Surgeon: Arta Silence, MD;  Location: [facility];  Service: Endoscopy;  Laterality: N/A;  . FINE NEEDLE ASPIRATION N/A 03/23/2014   Procedure: FINE NEEDLE ASPIRATION (FNA) LINEAR;  Surgeon: Arta Silence, MD;  Location: [facility];  Service: Endoscopy;  Laterality: N/A;  . TONSILLECTOMY      SOCIAL HISTORY: Social History  Substance Use Topics  . Smoking status: Never Smoker  . Smokeless tobacco: Never Used  . Alcohol use No     FAMILY HISTORY: Family History  Problem Relation Age of Onset  . Healthy Mother   . Hypertension Mother   . Hyperlipidemia Mother   . Alcoholism Mother   . Anxiety disorder Mother   . Drug abuse Mother   . Healthy Father   . Colon cancer Paternal Grandfather 56       colon    ROS: Review of Systems  Constitutional: Positive for weight loss.  Respiratory: Positive for cough.   Cardiovascular: Negative for chest pain.  Endo/Heme/Allergies:       Negative hypoglycemia    PHYSICAL EXAM: Blood pressure 116/75, pulse 60, temperature 98.4 F (36.9 C), temperature source Oral, height 5\' 5"  (1.651 m), weight 231 lb (104.8 kg), SpO2 98 %. Body mass index is 38.44 kg/m. Physical Exam  Constitutional: She is oriented to person, place, and time. She appears well-developed and well-nourished.  Cardiovascular: Normal rate.   Pulmonary/Chest: Effort normal.  Musculoskeletal: Normal range of motion.  Neurological: She is oriented to person, place, and time.  Skin: Skin is warm and dry.  Psychiatric: She has a normal mood and affect. Her behavior is normal.  Vitals reviewed.   RECENT LABS AND TESTS: BMET    Component Value Date/Time   NA 140 04/18/2016 1201   K 4.3 04/18/2016 1201   CL 101 04/18/2016 1201   CO2 23 04/18/2016 1201   GLUCOSE 100 (H) 04/18/2016 1201   GLUCOSE 99 09/26/2014 2012   BUN 7 04/18/2016 1201   CREATININE 0.72 04/18/2016 1201   CREATININE 0.65 05/05/2014 0902   CALCIUM 9.7 04/18/2016 1201   GFRNONAA 103 04/18/2016 1201   GFRNONAA >89 05/05/2014 0902   GFRAA 119 04/18/2016 1201   GFRAA >89 05/05/2014 0902   Lab Results  Component Value Date   HGBA1C 5.5 04/18/2016   HGBA1C 6.3 (H) 01/02/2016   HGBA1C WILL FOLLOW 01/02/2016   HGBA1C 6.3 (H) 08/24/2015   HGBA1C 6 02/14/2015   Lab Results  Component Value Date   INSULIN 21.8 04/18/2016   INSULIN 15.3 01/02/2016   INSULIN WILL FOLLOW 01/02/2016   CBC    Component Value Date/Time   WBC  9.2 04/18/2016 1201   WBC 10.7 (H) 09/26/2014 2012   RBC 4.50 04/18/2016 1201   RBC 4.53 09/26/2014 2012   HGB 13.7 04/18/2016 1201   HCT 40.8 04/18/2016 1201   PLT 392 (H) 04/18/2016 1201   MCV 91 04/18/2016 1201   MCH 30.4 04/18/2016 1201   MCH 30.0 09/26/2014 2012   MCHC 33.6 04/18/2016 1201   MCHC 34.0 09/26/2014 2012   RDW 12.8 04/18/2016 1201   LYMPHSABS 2.4 04/18/2016 1201   MONOABS 0.6 05/05/2014 0902   EOSABS 0.1 04/18/2016 1201   BASOSABS 0.0 04/18/2016 1201   Iron/TIBC/Ferritin/ %  Sat No results found for: IRON, TIBC, FERRITIN, IRONPCTSAT Lipid Panel     Component Value Date/Time   CHOL 140 04/18/2016 1201   TRIG 92 04/18/2016 1201   HDL 46 04/18/2016 1201   CHOLHDL 3.4 05/05/2014 0902   VLDL 16 05/05/2014 0902   LDLCALC 76 04/18/2016 1201   Hepatic Function Panel     Component Value Date/Time   PROT 7.8 04/18/2016 1201   ALBUMIN 4.5 04/18/2016 1201   AST 17 04/18/2016 1201   ALT 25 04/18/2016 1201   ALKPHOS 116 04/18/2016 1201   BILITOT 0.5 04/18/2016 1201      Component Value Date/Time   TSH 2.090 04/18/2016 1201   TSH 2.940 01/02/2016 1503   TSH 0.019 (L) 01/02/2016 1449    ASSESSMENT AND PLAN: Type 2 diabetes mellitus without complication, without long-term current use of insulin (HCC)  Cough  At risk for heart disease  Class 2 obesity with serious comorbidity and body mass index (BMI) of 38.0 to 38.9 in adult, unspecified obesity type  PLAN:  Diabetes II Jasmine Martinez has been given extensive diabetes education by myself today including ideal fasting and post-prandial blood glucose readings, individual ideal Hgb A1c goals  and hypoglycemia prevention. We discussed the importance of good blood sugar control to decrease the likelihood of diabetic complications such as nephropathy, neuropathy, limb loss, blindness, coronary artery disease, and death. We discussed the importance of intensive lifestyle modification including diet, exercise and weight  loss as the first line treatment for diabetes. She agrees to continue with diet, exercise and weight loss to help control diabetes. Jasmine Martinez agrees to continue Victoza and will plan to discontinue when she starts [place]. She agrees to follow up at the agreed upon time.  Cardiovascular risk counseling Jasmine Martinez was given extended (15 minutes) coronary artery disease prevention counseling today. She is 46 y.o. female and has risk factors for heart disease including obesity and diabetes. We discussed intensive lifestyle modifications today with an emphasis on specific weight loss instructions and strategies. Pt was also informed of the importance of increasing exercise and decreasing saturated fats to help prevent heart disease.  Cough Jasmine Martinez is advised to try OTC Delsym for symptomatic relief. She agrees to follow up with our clinic in 2 weeks.  Obesity Jasmine Martinez is currently in the action stage of change. As such, her goal is to continue with weight loss efforts She has agreed to follow a lower carbohydrate, vegetable and lean protein rich diet plan Jasmine Martinez has been instructed to work up to a goal of 150 minutes of combined cardio and strengthening exercise per week for weight loss and overall health benefits. We discussed the following Behavioral Modification Strategies today: planning for success, increasing lean protein intake, work on meal planning and easy cooking plans and avoiding temptations  Jasmine Martinez has agreed to follow up with our clinic in 2 weeks. She was informed of the importance of frequent follow up visits to maximize her success with intensive lifestyle modifications for her multiple health conditions.  I, Doreene Nest, am acting as transcriptionist for Dennard Nip, MD  I have reviewed the above documentation for accuracy and completeness, and I agree with the above. -Dennard Nip, MD

## 2016-09-17 DIAGNOSIS — F411 Generalized anxiety disorder: Secondary | ICD-10-CM | POA: Diagnosis not present

## 2016-09-18 ENCOUNTER — Encounter (INDEPENDENT_AMBULATORY_CARE_PROVIDER_SITE_OTHER): Payer: Self-pay | Admitting: Family Medicine

## 2016-09-24 DIAGNOSIS — F411 Generalized anxiety disorder: Secondary | ICD-10-CM | POA: Diagnosis not present

## 2016-09-25 ENCOUNTER — Ambulatory Visit (INDEPENDENT_AMBULATORY_CARE_PROVIDER_SITE_OTHER): Payer: 59 | Admitting: Family Medicine

## 2016-09-25 VITALS — BP 110/74 | HR 77 | Temp 98.4°F | Ht 65.0 in | Wt 229.0 lb

## 2016-09-25 DIAGNOSIS — IMO0001 Reserved for inherently not codable concepts without codable children: Secondary | ICD-10-CM

## 2016-09-25 DIAGNOSIS — R5383 Other fatigue: Secondary | ICD-10-CM | POA: Diagnosis not present

## 2016-09-25 DIAGNOSIS — E669 Obesity, unspecified: Secondary | ICD-10-CM | POA: Diagnosis not present

## 2016-09-25 DIAGNOSIS — E784 Other hyperlipidemia: Secondary | ICD-10-CM

## 2016-09-25 DIAGNOSIS — E119 Type 2 diabetes mellitus without complications: Secondary | ICD-10-CM | POA: Diagnosis not present

## 2016-09-25 DIAGNOSIS — Z9189 Other specified personal risk factors, not elsewhere classified: Secondary | ICD-10-CM | POA: Diagnosis not present

## 2016-09-25 DIAGNOSIS — E7849 Other hyperlipidemia: Secondary | ICD-10-CM

## 2016-09-25 DIAGNOSIS — E559 Vitamin D deficiency, unspecified: Secondary | ICD-10-CM

## 2016-09-25 DIAGNOSIS — Z6838 Body mass index (BMI) 38.0-38.9, adult: Secondary | ICD-10-CM | POA: Diagnosis not present

## 2016-09-25 DIAGNOSIS — F3289 Other specified depressive episodes: Secondary | ICD-10-CM | POA: Diagnosis not present

## 2016-09-25 MED ORDER — ATORVASTATIN CALCIUM 10 MG PO TABS: 10.0000 mg | ORAL_TABLET | Freq: Every day | ORAL | 0 refills | Status: DC

## 2016-09-25 MED ORDER — BUPROPION HCL ER (SR) 200 MG PO TB12: 200.0000 mg | ORAL_TABLET | Freq: Every day | ORAL | 0 refills | Status: DC

## 2016-09-25 MED FILL — ATORVASTATIN 10 MG TABLET: 10 | 30 days supply | Qty: 30 | Fill #0

## 2016-09-25 NOTE — Progress Notes (Signed)
Office: [phone]  /  Fax: [phone]   HPI:   Chief Complaint: OBESITY Jasmine Martinez is here to discuss her progress with her obesity treatment plan. She is on the lower carbohydrate, vegetable and lean protein rich diet plan and is following her eating plan approximately 100 % of the time. She states she is exercising 0 minutes 0 times per week. Jasmine Martinez continues to do well with weight loss on her lower carbohydrate plan and she is getting better support at home. Her weight is 229 lb (103.9 kg) today and has had a weight loss of 2 pounds over a period of 2 weeks since her last visit. She has lost 31 lbs since starting treatment with [place].  Fatigue Jasmine Martinez notes mildly increased fatigue. She has a family history of hypothyroid. Her last thyroid labs were within normal limits.  Vitamin D deficiency Jasmine Martinez has a diagnosis of vitamin D deficiency. She is currently stable on vit D and denies nausea, vomiting or muscle weakness. Jasmine Martinez is due for labs.  Diabetes II Jasmine Martinez has a diagnosis of diabetes type II. She is stable on Victoza and is ready to change to Saxenda once it is approved by the pharmacy. Jasmine Martinez denies any hypoglycemic episodes. She has been working on intensive lifestyle modifications including diet, exercise, and weight loss to help control her blood glucose levels. Jasmine Martinez is due for labs.  Hyperlipidemia Jasmine Martinez has hyperlipidemia and has been trying to improve her cholesterol levels with intensive lifestyle modification including a low saturated fat diet, exercise and weight loss. She is stable on Lipitor and she denies any chest pain, claudication or myalgias.  At risk for cardiovascular disease Jasmine Martinez is at a higher than average risk for cardiovascular disease due to obesity, hyperlipidemia and diabetes. She currently denies any chest pain.  Depression with emotional eating behaviors Jasmine Martinez is doing better controlling her emotional eating. Jasmine Martinez  struggles with emotional eating and using food for comfort to the extent that it is negatively impacting her health. She often snacks when she is not hungry. Jasmine Martinez sometimes feels she is out of control and then feels guilty that she made poor food choices. She has been working on behavior modification techniques to help reduce her emotional eating and has been somewhat successful. Her mood is stable on Wellbutrin. She shows no sign of suicidal or homicidal ideations.  Depression screen Jasmine Martinez 2/9 04/23/2016 01/02/2016 02/14/2015 06/23/2014  Decreased Interest 0 1 0 1  Down, Depressed, Hopeless 0 1 0 1  PHQ - 2 Score 0 2 0 2  Altered sleeping - 2 - 1  Tired, decreased energy - 3 - 1  Change in appetite - 3 - 0  Feeling bad or failure about yourself  - 3 - 0  Trouble concentrating - 0 - 0  Moving slowly or fidgety/restless - 0 - 0  Suicidal thoughts - 0 - 0  PHQ-9 Score - 13 - 4  Difficult doing work/chores - - - Not difficult at all     ALLERGIES: Allergies  Allergen Reactions   Codeine Shortness Of Breath   Hydrocodone Shortness Of Breath    Mild SOB    MEDICATIONS: Current Outpatient Prescriptions on File Prior to Visit  Medication Sig Dispense Refill   ACCU-CHEK FASTCLIX LANCETS MISC 1 Bottle by Does not apply route every morning. 100 each 0   atorvastatin (LIPITOR) 10 MG tablet Take 1 tablet (10 mg total) by mouth daily. 30 tablet 0   buPROPion (WELLBUTRIN SR) 200 MG 12 hr  tablet Take 1 tablet (200 mg total) by mouth daily. 30 tablet 0   glucose blood (ACCU-CHEK GUIDE) test strip Use as instructed 100 each 0   levonorgestrel (MIRENA) 20 MCG/24HR IUD 1 each by Intrauterine route once.     Liraglutide -Weight Management (SAXENDA) 18 MG/3ML SOPN Inject 3 mg into the skin daily. 5 pen 0   Melatonin 10 MG TABS Take 1 tablet by mouth at bedtime. 30 tablet 0   NOVOFINE 32G X 6 MM MISC Inject 1 Stick as directed daily. 90 each 5   TRUE METRIX BLOOD GLUCOSE TEST test strip 1  each by Other route daily. 1 stick daily 100 each 0   TRUEPLUS LANCETS 30G MISC Inject 1 Stick as directed daily. 100 each 0   Vitamin D, Ergocalciferol, (DRISDOL) 50000 units CAPS capsule Take 1 capsule (50,000 Units total) by mouth every 7 (seven) days. 4 capsule 0   No current facility-administered medications on file prior to visit.     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   GERD (gastroesophageal reflux disease)    Heartburn    HTN (hypertension)    Hyperlipidemia    Obesity    Palpitations    PMDD (premenstrual dysphoric disorder) 01/02/2016   Shortness of breath    Vitamin D deficiency    Vitamin D deficiency     PAST SURGICAL HISTORY: Past Surgical History:  Procedure Laterality Date   APPENDECTOMY     CHOLECYSTECTOMY     EUS N/A 03/23/2014   Procedure: ESOPHAGEAL ENDOSCOPIC ULTRASOUND (EUS) RADIAL;  Surgeon: Arta Silence, MD;  Location: [facility];  Service: Endoscopy;  Laterality: N/A;   FINE NEEDLE ASPIRATION N/A 03/23/2014   Procedure: FINE NEEDLE ASPIRATION (FNA) LINEAR;  Surgeon: Arta Silence, MD;  Location: [facility];  Service: Endoscopy;  Laterality: N/A;   TONSILLECTOMY      SOCIAL HISTORY: Social History  Substance Use Topics   Smoking status: Never Smoker   Smokeless tobacco: Never Used   Alcohol use No    FAMILY HISTORY: Family History  Problem Relation Age of Onset   Healthy Mother    Hypertension Mother    Hyperlipidemia Mother    Alcoholism Mother    Anxiety disorder Mother    Drug abuse Mother    Healthy Father    Colon cancer Paternal Grandfather 80       colon    ROS: Review of Systems  Constitutional: Positive for malaise/fatigue and weight loss.  Cardiovascular:  Negative for chest pain and claudication.  Gastrointestinal: Negative for nausea and vomiting.  Musculoskeletal: Negative for myalgias.       Negative muscle weakness   Endo/Heme/Allergies:       Negative hypoglycemia  Psychiatric/Behavioral: Positive for depression. Negative for suicidal ideas.    PHYSICAL EXAM: Blood pressure 110/74, pulse 77, temperature 98.4 F (36.9 C), temperature source Oral, height 5\' 5"  (1.651 m), weight 229 lb (103.9 kg), SpO2 98 %. Body mass index is 38.11 kg/m. Physical Exam  Constitutional: She is oriented to person, place, and time. She appears well-developed and well-nourished.  Cardiovascular: Normal rate.   Pulmonary/Chest: Effort normal.  Musculoskeletal: Normal range of motion.  Neurological:  She is oriented to person, place, and time.  Skin: Skin is warm and dry.  Psychiatric: She has a normal mood and affect. Her behavior is normal.  Vitals reviewed.   RECENT LABS AND TESTS: BMET    Component Value Date/Time   NA 140 04/18/2016 1201   K 4.3 04/18/2016 1201   CL 101 04/18/2016 1201   CO2 23 04/18/2016 1201   GLUCOSE 100 (H) 04/18/2016 1201   GLUCOSE 99 09/26/2014 2012   BUN 7 04/18/2016 1201   CREATININE 0.72 04/18/2016 1201   CREATININE 0.65 05/05/2014 0902   CALCIUM 9.7 04/18/2016 1201   GFRNONAA 103 04/18/2016 1201   GFRNONAA >89 05/05/2014 0902   GFRAA 119 04/18/2016 1201   GFRAA >89 05/05/2014 0902   Lab Results  Component Value Date   HGBA1C 5.5 04/18/2016   HGBA1C 6.3 (H) 01/02/2016   HGBA1C WILL FOLLOW 01/02/2016   HGBA1C 6.3 (H) 08/24/2015   HGBA1C 6 02/14/2015   Lab Results  Component Value Date   INSULIN 21.8 04/18/2016   INSULIN 15.3 01/02/2016   INSULIN WILL FOLLOW 01/02/2016   CBC    Component Value Date/Time   WBC 9.2 04/18/2016 1201   WBC 10.7 (H) 09/26/2014 2012   RBC 4.50 04/18/2016 1201   RBC 4.53 09/26/2014 2012   HGB 13.7 04/18/2016 1201   HCT 40.8 04/18/2016 1201   PLT 392 (H) 04/18/2016  1201   MCV 91 04/18/2016 1201   MCH 30.4 04/18/2016 1201   MCH 30.0 09/26/2014 2012   MCHC 33.6 04/18/2016 1201   MCHC 34.0 09/26/2014 2012   RDW 12.8 04/18/2016 1201   LYMPHSABS 2.4 04/18/2016 1201   MONOABS 0.6 05/05/2014 0902   EOSABS 0.1 04/18/2016 1201   BASOSABS 0.0 04/18/2016 1201   Iron/TIBC/Ferritin/ %Sat No results found for: IRON, TIBC, FERRITIN, IRONPCTSAT Lipid Panel     Component Value Date/Time   CHOL 140 04/18/2016 1201   TRIG 92 04/18/2016 1201   HDL 46 04/18/2016 1201   CHOLHDL 3.4 05/05/2014 0902   VLDL 16 05/05/2014 0902   LDLCALC 76 04/18/2016 1201   Hepatic Function Panel     Component Value Date/Time   PROT 7.8 04/18/2016 1201   ALBUMIN 4.5 04/18/2016 1201   AST 17 04/18/2016 1201   ALT 25 04/18/2016 1201   ALKPHOS 116 04/18/2016 1201   BILITOT 0.5 04/18/2016 1201      Component Value Date/Time   TSH 2.090 04/18/2016 1201   TSH 2.940 01/02/2016 1503   TSH 0.019 (L) 01/02/2016 1449    ASSESSMENT AND PLAN: Other hyperlipidemia - Plan: Lipid Panel With LDL/HDL Ratio, atorvastatin (LIPITOR) 10 MG tablet  Other fatigue - Plan: T3, T4, free, TSH  Type 2 diabetes mellitus without complication, without long-term current use of insulin ([place]) - Plan: Comprehensive metabolic panel, Hemoglobin A1c, Insulin, random  Vitamin D deficiency - Plan: VITAMIN D 25 Hydroxy (Vit-D Deficiency, Fractures)  Other depression - With emotional eating - Plan: buPROPion (WELLBUTRIN SR) 200 MG 12 hr tablet  At risk for heart disease  Class 2 obesity with serious comorbidity and body mass index (BMI) of 38.0 to 38.9 in adult, unspecified obesity type  PLAN:  Fatigue Jasmine Martinez was informed that her fatigue may be related to obesity, depression or many other causes. Labs will be ordered (thyroid), and in the meanwhile Jasmine Martinez has agreed to work on diet, exercise and weight loss to help with fatigue. Proper sleep hygiene was discussed including the need for 7-8 hours of  quality sleep each night. Jasmine Martinez agrees to follow up with our clinic in 2 weeks.  Vitamin D Deficiency Jasmine Martinez was informed that low vitamin D levels contributes to fatigue and are associated with obesity, breast, and colon cancer. She agrees to continue to take prescription Vit D @50 ,000 IU every week and we will check labs and will follow up for routine testing of vitamin D, at least 2-3 times per year. She was informed of the risk of over-replacement of vitamin D and agrees to not increase her dose unless he discusses this with [place] first. Jasmine Martinez agrees to follow up with our clinic in 2 weeks.  Diabetes II Jasmine Martinez has been given extensive diabetes education by myself today including ideal fasting and post-prandial blood glucose readings, individual ideal Hgb A1c goals  and hypoglycemia prevention. We discussed the importance of good blood sugar control to decrease the likelihood of diabetic complications such as nephropathy, neuropathy, limb loss, blindness, coronary artery disease, and death. We discussed the importance of intensive lifestyle modification including diet, exercise and weight loss as the first line treatment for diabetes. We will check labs and Jasmine Martinez agrees to continue her diabetes medications and will follow up at the agreed upon time.  Hyperlipidemia Jasmine Martinez was informed of the American Heart Association Guidelines emphasizing intensive lifestyle modifications as the first line treatment for hyperlipidemia. We discussed many lifestyle modifications today in depth, and Daryle will continue to work on decreasing saturated fats such as fatty red meat, butter and many fried foods. She will also increase vegetables and lean protein in her diet and continue to work on exercise and weight loss efforts. We will check labs and Jasmine Martinez agrees to continue Lipitor, we will refill for 1 month and she will follow up at the agreed upon time.  Cardiovascular risk counseling Jasmine Martinez was  given extended (15 minutes) coronary artery disease prevention counseling today. She is 46 y.o. female and has risk factors for heart disease including obesity, hyperlipidemia and diabetes. We discussed intensive lifestyle modifications today with an emphasis on specific weight loss instructions and strategies. Pt was also informed of the importance of increasing exercise and decreasing saturated fats to help prevent heart disease.  Depression with Emotional Eating Behaviors We discussed behavior modification techniques today to help Jasmine Martinez deal with her emotional eating and depression. She has agreed to take Wellbutrin SR 200 mg qd, we will refill for 1 month and she will follow up as directed.  Obesity Jasmine Martinez is currently in the action stage of change. As such, her goal is to continue with weight loss efforts She has agreed to follow a lower carbohydrate, vegetable and lean protein rich diet plan Jasmine Martinez has been instructed to work up to a goal of 150 minutes of combined cardio and strengthening exercise per week for weight loss and overall health benefits. We discussed the following Behavioral Modification Strategies today: increasing lean protein intake and decreasing simple carbohydrates   Jasmine Martinez has agreed to follow up with our clinic in 2 weeks. She was informed of the importance of frequent follow up visits to maximize her success with intensive lifestyle modifications for her multiple health conditions.  I, Doreene Nest, am acting as transcriptionist for Dennard Nip, MD  I have reviewed the above documentation for accuracy and completeness, and I agree with the above. -Dennard Nip, MD

## 2016-09-26 LAB — LIPID PANEL WITH LDL/HDL RATIO
CHOLESTEROL TOTAL: 113 mg/dL (ref 100–199)
HDL: 39 mg/dL — ABNORMAL LOW (ref >39)
LDL Calculated: 59 mg/dL (ref 0–99)
LDl/HDL Ratio: 1.5 ratio (ref 0.0–3.2)
TRIGLYCERIDES: 73 mg/dL (ref 0–149)
VLDL Cholesterol Cal: 15 mg/dL (ref 5–40)

## 2016-09-26 LAB — COMPREHENSIVE METABOLIC PANEL
ALT: 17 IU/L (ref 0–32)
AST: 16 IU/L (ref 0–40)
Albumin/Globulin Ratio: 1.5 (ref 1.2–2.2)
Albumin: 4.5 g/dL (ref 3.5–5.5)
Alkaline Phosphatase: 105 IU/L (ref 39–117)
BUN/Creatinine Ratio: 14 (ref 9–23)
BUN: 11 mg/dL (ref 6–24)
Bilirubin Total: 0.7 mg/dL (ref 0.0–1.2)
CO2: 22 mmol/L (ref 20–29)
CREATININE: 0.76 mg/dL (ref 0.57–1.00)
Calcium: 9.6 mg/dL (ref 8.7–10.2)
Chloride: 100 mmol/L (ref 96–106)
GFR calc Af Amer: 110 mL/min/{1.73_m2} (ref >59)
GFR calc non Af Amer: 96 mL/min/{1.73_m2} (ref >59)
GLUCOSE: 93 mg/dL (ref 65–99)
Globulin, Total: 3 g/dL (ref 1.5–4.5)
Potassium: 3.9 mmol/L (ref 3.5–5.2)
Sodium: 141 mmol/L (ref 134–144)
Total Protein: 7.5 g/dL (ref 6.0–8.5)

## 2016-09-26 LAB — VITAMIN D 25 HYDROXY (VIT D DEFICIENCY, FRACTURES): Vit D, 25-Hydroxy: 35.1 ng/mL (ref 30.0–100.0)

## 2016-09-26 LAB — INSULIN, RANDOM: INSULIN: 11.7 u[IU]/mL (ref 2.6–24.9)

## 2016-09-26 LAB — HEMOGLOBIN A1C
ESTIMATED AVERAGE GLUCOSE: 94 mg/dL
Hgb A1c MFr Bld: 4.9 % (ref 4.8–5.6)

## 2016-09-26 LAB — TSH: TSH: 3.26 u[IU]/mL (ref 0.450–4.500)

## 2016-09-26 LAB — T4, FREE: FREE T4: 1.71 ng/dL (ref 0.82–1.77)

## 2016-09-26 LAB — T3: T3, Total: 132 ng/dL (ref 71–180)

## 2016-09-27 MED FILL — SAXENDA 18 MG/3 ML PEN: 18 | 30 days supply | Qty: 15 | Fill #0

## 2016-10-01 DIAGNOSIS — F411 Generalized anxiety disorder: Secondary | ICD-10-CM | POA: Diagnosis not present

## 2016-10-08 ENCOUNTER — Ambulatory Visit (INDEPENDENT_AMBULATORY_CARE_PROVIDER_SITE_OTHER): Payer: 59 | Admitting: Family Medicine

## 2016-10-08 VITALS — BP 105/71 | HR 69 | Temp 98.1°F | Ht 65.0 in | Wt 229.0 lb

## 2016-10-08 DIAGNOSIS — F411 Generalized anxiety disorder: Secondary | ICD-10-CM | POA: Diagnosis not present

## 2016-10-08 DIAGNOSIS — E559 Vitamin D deficiency, unspecified: Secondary | ICD-10-CM | POA: Diagnosis not present

## 2016-10-08 DIAGNOSIS — IMO0001 Reserved for inherently not codable concepts without codable children: Secondary | ICD-10-CM

## 2016-10-08 DIAGNOSIS — Z6838 Body mass index (BMI) 38.0-38.9, adult: Secondary | ICD-10-CM | POA: Diagnosis not present

## 2016-10-08 DIAGNOSIS — E669 Obesity, unspecified: Secondary | ICD-10-CM

## 2016-10-08 DIAGNOSIS — Z9189 Other specified personal risk factors, not elsewhere classified: Secondary | ICD-10-CM

## 2016-10-08 MED ORDER — VITAMIN D (ERGOCALCIFEROL) 1.25 MG (50000 UNIT) PO CAPS: 50000.0000 [IU] | ORAL_CAPSULE | ORAL | 0 refills | Status: DC

## 2016-10-09 MED FILL — VIT D2 1.25 MG (50,000 UNIT: 1.25 MG | 30 days supply | Qty: 10 | Fill #0

## 2016-10-09 NOTE — Progress Notes (Signed)
Office: [phone]  /  Fax: [phone]   HPI:   Chief Complaint: OBESITY Jasmine Martinez is here to discuss her progress with her obesity treatment plan. She is on lower carbohydrate, vegetable and lean protein rich diet plan and is following her eating plan approximately 100 % of the time. She states she is walking for 30 minutes 1 time per week. Bill continues to do well with her low carb plan. She has maintained her weight loss but is retaining some fluid today. Hunger is controlled and she has done better with emotional eating. Her weight is 229 lb (103.9 kg) today and has maintained weight over a period of 2 weeks since her last visit. She has lost 31 lbs since starting treatment with [place].  Vitamin D deficiency [name] has a diagnosis of vitamin D deficiency. She is stable on vit D but is not yet at goal and still notes fatigue but denies nausea, vomiting or muscle weakness.  At risk for cardiovascular disease [name] is at a higher than average risk for cardiovascular disease due to obesity. She currently denies any chest pain.   ALLERGIES: Allergies  Allergen Reactions   Codeine Shortness Of Breath   Hydrocodone Shortness Of Breath    Mild SOB    MEDICATIONS: Current Outpatient Prescriptions on File Prior to Visit  Medication Sig Dispense Refill   ACCU-CHEK FASTCLIX LANCETS MISC 1 Bottle by Does not apply route every morning. 100 each 0   atorvastatin (LIPITOR) 10 MG tablet Take 1 tablet (10 mg total) by mouth daily. 30 tablet 0   buPROPion (WELLBUTRIN SR) 200 MG 12 hr tablet Take 1 tablet (200 mg total) by mouth daily. 30 tablet 0   glucose blood (ACCU-CHEK GUIDE) test strip Use as instructed 100 each 0   levonorgestrel (MIRENA) 20 MCG/24HR IUD 1 each by Intrauterine route once.     Liraglutide -Weight Management (SAXENDA) 18 MG/3ML SOPN Inject 3 mg into the skin daily. 5 pen 0   Melatonin 10 MG TABS Take 1 tablet by mouth at bedtime. 30 tablet 0   NOVOFINE  32G X 6 MM MISC Inject 1 Stick as directed daily. 90 each 5   TRUE METRIX BLOOD GLUCOSE TEST test strip 1 each by Other route daily. 1 stick daily 100 each 0   TRUEPLUS LANCETS 30G MISC Inject 1 Stick as directed daily. 100 each 0   No current facility-administered medications on file prior to visit.     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   GERD (gastroesophageal reflux disease)    Heartburn    HTN (hypertension)    Hyperlipidemia    Obesity    Palpitations    PMDD (premenstrual dysphoric disorder) 01/02/2016   Shortness of breath    Vitamin D deficiency    Vitamin D deficiency     PAST SURGICAL HISTORY: Past Surgical History:  Procedure Laterality Date   APPENDECTOMY     CHOLECYSTECTOMY     EUS N/A 03/23/2014   Procedure: ESOPHAGEAL ENDOSCOPIC ULTRASOUND (EUS) RADIAL;  Surgeon: Arta Silence, MD;  Location: [facility];  Service: Endoscopy;  Laterality: N/A;   FINE NEEDLE ASPIRATION N/A 03/23/2014   Procedure: FINE  NEEDLE ASPIRATION (FNA) LINEAR;  Surgeon: Arta Silence, MD;  Location: [facility];  Service: Endoscopy;  Laterality: N/A;   TONSILLECTOMY      SOCIAL HISTORY: Social History  Substance Use Topics   Smoking status: Never Smoker   Smokeless tobacco: Never Used   Alcohol use No    FAMILY HISTORY: Family History  Problem Relation Age of Onset   Healthy Mother    Hypertension Mother    Hyperlipidemia Mother    Alcoholism Mother    Anxiety disorder Mother    Drug abuse Mother    Healthy Father    Colon cancer Paternal Grandfather 13       colon    ROS: Review of Systems  Constitutional: Positive for malaise/fatigue. Negative for weight loss.  Cardiovascular: Negative for chest pain.    Gastrointestinal: Negative for nausea and vomiting.  Musculoskeletal:       Negative muscle weakness    PHYSICAL EXAM: Blood pressure 105/71, pulse 69, temperature 98.1 F (36.7 C), temperature source Oral, height 5\' 5"  (1.651 m), weight 229 lb (103.9 kg), SpO2 96 %. Body mass index is 38.11 kg/m. Physical Exam  Constitutional: She is oriented to person, place, and time. She appears well-developed and well-nourished.  Cardiovascular: Normal rate.   Pulmonary/Chest: Effort normal.  Musculoskeletal: Normal range of motion.  Neurological: She is oriented to person, place, and time.  Skin: Skin is warm and dry.  Psychiatric: She has a normal mood and affect. Her behavior is normal.  Vitals reviewed.   RECENT LABS AND TESTS: BMET    Component Value Date/Time   NA 141 09/25/2016 0811   K 3.9 09/25/2016 0811   CL 100 09/25/2016 0811   CO2 22 09/25/2016 0811   GLUCOSE 93 09/25/2016 0811   GLUCOSE 99 09/26/2014 2012   BUN 11 09/25/2016 0811   CREATININE 0.76 09/25/2016 0811   CREATININE 0.65 05/05/2014 0902   CALCIUM 9.6 09/25/2016 0811   GFRNONAA 96 09/25/2016 0811   GFRNONAA >89 05/05/2014 0902   GFRAA 110 09/25/2016 0811   GFRAA >89 05/05/2014 0902   Lab Results  Component Value Date   HGBA1C 4.9 09/25/2016   HGBA1C 5.5 04/18/2016   HGBA1C 6.3 (H) 01/02/2016   HGBA1C WILL FOLLOW 01/02/2016   HGBA1C 6.3 (H) 08/24/2015   Lab Results  Component Value Date   INSULIN 11.7 09/25/2016   INSULIN 21.8 04/18/2016   INSULIN 15.3 01/02/2016   INSULIN WILL FOLLOW 01/02/2016   CBC    Component Value Date/Time   WBC 9.2 04/18/2016 1201   WBC 10.7 (H) 09/26/2014 2012   RBC 4.50 04/18/2016 1201   RBC 4.53 09/26/2014 2012   HGB 13.7 04/18/2016 1201   HCT 40.8 04/18/2016 1201   PLT 392 (H) 04/18/2016 1201   MCV 91 04/18/2016 1201   MCH 30.4 04/18/2016 1201   MCH 30.0 09/26/2014 2012   MCHC 33.6 04/18/2016 1201   MCHC 34.0 09/26/2014 2012   RDW 12.8 04/18/2016 1201    LYMPHSABS 2.4 04/18/2016 1201   MONOABS 0.6 05/05/2014 0902   EOSABS 0.1 04/18/2016 1201   BASOSABS 0.0 04/18/2016 1201   Iron/TIBC/Ferritin/ %Sat No results found for: IRON, TIBC, FERRITIN, IRONPCTSAT Lipid Panel     Component Value Date/Time   CHOL 113 09/25/2016 0811   TRIG 73 09/25/2016 0811   HDL 39 (L) 09/25/2016 0811   CHOLHDL 3.4 05/05/2014 0902   VLDL 16 05/05/2014 0902   LDLCALC 59 09/25/2016 0811   Hepatic Function Panel  Component Value Date/Time   PROT 7.5 09/25/2016 0811   ALBUMIN 4.5 09/25/2016 0811   AST 16 09/25/2016 0811   ALT 17 09/25/2016 0811   ALKPHOS 105 09/25/2016 0811   BILITOT 0.7 09/25/2016 0811      Component Value Date/Time   TSH 3.260 09/25/2016 0811   TSH 2.090 04/18/2016 1201   TSH 2.940 01/02/2016 1503    ASSESSMENT AND PLAN: Vitamin D deficiency - Plan: Vitamin D, Ergocalciferol, (DRISDOL) 50000 units CAPS capsule  At risk for heart disease  Class 2 obesity with serious comorbidity and body mass index (BMI) of 38.0 to 38.9 in adult, unspecified obesity type  PLAN:  Vitamin D Deficiency Nidya was informed that low vitamin D levels contributes to fatigue and are associated with obesity, breast, and colon cancer. She agrees to increase prescription Vit D @50 ,000 IU to q 3 days #10 with no refills and will follow up for routine testing of vitamin D, at least 2-3 times per year. She was informed of the risk of over-replacement of vitamin D and agrees to not increase her dose unless he discusses this with [place] first. Marletta agrees to follow up with our clinic in 2 weeks.  Cardiovascular risk counseling Hadessah was given extended (15 minutes) coronary artery disease prevention counseling today. She is 46 y.o. female and has risk factors for heart disease including obesity. We discussed intensive lifestyle modifications today with an emphasis on specific weight loss instructions and strategies. Pt was also informed of the importance of  increasing exercise and decreasing saturated fats to help prevent heart disease.  Obesity Marney is currently in the action stage of change. As such, her goal is to continue with weight loss efforts She has agreed to follow a lower carbohydrate, vegetable and lean protein rich diet plan Tamara has been instructed to work up to a goal of 150 minutes of combined cardio and strengthening exercise per week for weight loss and overall health benefits. We discussed the following Behavioral Modification Strategies today: increasing lean protein intake and decreasing simple carbohydrates   Toye has agreed to follow up with our clinic in 2 weeks. She was informed of the importance of frequent follow up visits to maximize her success with intensive lifestyle modifications for her multiple health conditions.  I, Doreene Nest, am acting as transcriptionist for Dennard Nip, MD  I have reviewed the above documentation for accuracy and completeness, and I agree with the above. -Dennard Nip, MD

## 2016-10-15 DIAGNOSIS — F411 Generalized anxiety disorder: Secondary | ICD-10-CM | POA: Diagnosis not present

## 2016-10-22 ENCOUNTER — Ambulatory Visit (INDEPENDENT_AMBULATORY_CARE_PROVIDER_SITE_OTHER): Payer: 59 | Admitting: Family Medicine

## 2016-10-22 VITALS — BP 115/78 | HR 78 | Temp 98.0°F | Ht 65.0 in | Wt 226.0 lb

## 2016-10-22 DIAGNOSIS — F3289 Other specified depressive episodes: Secondary | ICD-10-CM

## 2016-10-22 DIAGNOSIS — F411 Generalized anxiety disorder: Secondary | ICD-10-CM | POA: Diagnosis not present

## 2016-10-22 DIAGNOSIS — E119 Type 2 diabetes mellitus without complications: Secondary | ICD-10-CM | POA: Diagnosis not present

## 2016-10-22 DIAGNOSIS — Z6837 Body mass index (BMI) 37.0-37.9, adult: Secondary | ICD-10-CM

## 2016-10-22 DIAGNOSIS — Z9189 Other specified personal risk factors, not elsewhere classified: Secondary | ICD-10-CM | POA: Diagnosis not present

## 2016-10-22 MED ORDER — BUPROPION HCL ER (SR) 200 MG PO TB12: 200.0000 mg | ORAL_TABLET | Freq: Every day | ORAL | 0 refills | Status: DC

## 2016-10-23 MED FILL — BUPROPION HCL SR 200 MG TAB: 200 | 30 days supply | Qty: 30 | Fill #0

## 2016-10-23 NOTE — Progress Notes (Signed)
Office: [phone]  /  Fax: [phone]   HPI:   Chief Complaint: OBESITY Jasmine Martinez is here to discuss her progress with her obesity treatment plan. She is on the lower carbohydrate, vegetable and lean protein rich diet plan and is following her eating plan approximately 100 % of the time. She states she is exercising 0 minutes 0 times per week. Jasmine Martinez continues to do well with weight loss on her lower carbohydrate diet. Hunger is controlled. Her weight is 226 lb (102.5 kg) today and has had a weight loss of 3 pounds over a period of 2 weeks since her last visit. She has lost 34 lbs since starting treatment with [place].  Diabetes II Jasmine Martinez has a diagnosis of diabetes type II. She is doing well on her diet and blood sugar is well controlled on Saxenda. She denies any hypoglycemic episodes. She has been working on intensive lifestyle modifications including diet, exercise, and weight loss to help control her blood glucose levels.  At risk for cardiovascular disease Jasmine Martinez is at a higher than average risk for cardiovascular disease due to obesity and diabetes. She currently denies any chest pain.  Depression with emotional eating behaviors Jasmine Martinez is struggling with emotional eating and using food for comfort to the extent that it is negatively impacting her health. She often snacks when she is not hungry. Jasmine Martinez sometimes feels she is out of control and then feels guilty that she made poor food choices. She has been working on behavior modification techniques to help reduce her emotional eating and has been somewhat successful. Her mood is stable on Wellbutrin and she is handling stress better and is able to avoid emotional eating more effectively. Jasmine Martinez shows no sign of suicidal or homicidal ideations.  Depression screen Jasmine Martinez 2/9 04/23/2016 01/02/2016 02/14/2015 06/23/2014  Decreased Interest 0 1 0 1  Down, Depressed, Hopeless 0 1 0 1  PHQ - 2 Score 0 2 0 2  Altered sleeping - 2 - 1    Tired, decreased energy - 3 - 1  Change in appetite - 3 - 0  Feeling bad or failure about yourself  - 3 - 0  Trouble concentrating - 0 - 0  Moving slowly or fidgety/restless - 0 - 0  Suicidal thoughts - 0 - 0  PHQ-9 Score - 13 - 4  Difficult doing work/chores - - - Not difficult at all      ALLERGIES: Allergies  Allergen Reactions  . Codeine Shortness Of Breath  . Hydrocodone Shortness Of Breath    Mild SOB    MEDICATIONS: Current Outpatient Prescriptions on File Prior to Visit  Medication Sig Dispense Refill  . ACCU-CHEK FASTCLIX LANCETS MISC 1 Bottle by Does not apply route every morning. 100 each 0  . atorvastatin (LIPITOR) 10 MG tablet Take 1 tablet (10 mg total) by mouth daily. 30 tablet 0  . glucose blood (ACCU-CHEK GUIDE) test strip Use as instructed 100 each 0  . levonorgestrel (MIRENA) 20 MCG/24HR IUD 1 each by Intrauterine route once.    . Liraglutide -Weight Management (SAXENDA) 18 MG/3ML SOPN Inject 3 mg into the skin daily. 5 pen 0  . Melatonin 10 MG TABS Take 1 tablet by mouth at bedtime. 30 tablet 0  . NOVOFINE 32G X 6 MM MISC Inject 1 Stick as directed daily. 90 each 5  . TRUE METRIX BLOOD GLUCOSE TEST test strip 1 each by Other route daily. 1 stick daily 100 each 0  . TRUEPLUS LANCETS 30G MISC Inject  1 Stick as directed daily. 100 each 0  . Vitamin D, Ergocalciferol, (DRISDOL) 50000 units CAPS capsule Take 1 capsule (50,000 Units total) by mouth every 3 (three) days. 10 capsule 0   No current facility-administered medications on file prior to visit.     PAST MEDICAL HISTORY: Past Medical History:  Diagnosis Date  . Anxiety   . Back pain   . Chest pain   . Depression   . Diabetes mellitus   . Diarrhea   . Edema    feet and legs  . Gallbladder problem   . 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 GERD (gastroesophageal reflux disease)   . Heartburn   . HTN (hypertension)   . Hyperlipidemia   . Obesity   . Palpitations   .  PMDD (premenstrual dysphoric disorder) 01/02/2016  . Shortness of breath   . Vitamin D deficiency   . Vitamin D deficiency     PAST SURGICAL HISTORY: Past Surgical History:  Procedure Laterality Date  . APPENDECTOMY    . CHOLECYSTECTOMY    . EUS N/A 03/23/2014   Procedure: ESOPHAGEAL ENDOSCOPIC ULTRASOUND (EUS) RADIAL;  Surgeon: Arta Silence, MD;  Location: [facility];  Service: Endoscopy;  Laterality: N/A;  . FINE NEEDLE ASPIRATION N/A 03/23/2014   Procedure: FINE NEEDLE ASPIRATION (FNA) LINEAR;  Surgeon: Arta Silence, MD;  Location: [facility];  Service: Endoscopy;  Laterality: N/A;  . TONSILLECTOMY      SOCIAL HISTORY: Social History  Substance Use Topics  . Smoking status: Never Smoker  . Smokeless tobacco: Never Used  . Alcohol use No    FAMILY HISTORY: Family History  Problem Relation Age of Onset  . Healthy Mother   . Hypertension Mother   . Hyperlipidemia Mother   . Alcoholism Mother   . Anxiety disorder Mother   . Drug abuse Mother   . Healthy Father   . Colon cancer Paternal Grandfather 44       colon    ROS: Review of Systems  Constitutional: Positive for weight loss.  Cardiovascular: Negative for chest pain.  Endo/Heme/Allergies:       Negative hypoglycemia  Psychiatric/Behavioral: Positive for depression. Negative for suicidal ideas.    PHYSICAL EXAM: Blood pressure 115/78, pulse 78, temperature 98 F (36.7 C), height 5\' 5"  (1.651 m), weight 226 lb (102.5 kg), SpO2 100 %. Body mass index is 37.61 kg/m. Physical Exam  Constitutional: She is oriented to person, place, and time. She appears well-developed and well-nourished.  Cardiovascular: Normal rate.   Pulmonary/Chest: Effort normal.  Musculoskeletal: Normal range of motion.  Neurological: She is oriented to person, place, and time.  Skin: Skin is warm and dry.  Psychiatric: She has a normal mood and affect. Her behavior is normal.  Vitals reviewed.   RECENT LABS AND TESTS: BMET      Component Value Date/Time   NA 141 09/25/2016 0811   K 3.9 09/25/2016 0811   CL 100 09/25/2016 0811   CO2 22 09/25/2016 0811   GLUCOSE 93 09/25/2016 0811   GLUCOSE 99 09/26/2014 2012   BUN 11 09/25/2016 0811   CREATININE 0.76 09/25/2016 0811   CREATININE 0.65 05/05/2014 0902   CALCIUM 9.6 09/25/2016 0811   GFRNONAA 96 09/25/2016 0811   GFRNONAA >89 05/05/2014 0902   GFRAA 110 09/25/2016 0811   GFRAA >89 05/05/2014 0902   Lab Results  Component Value Date   HGBA1C 4.9 09/25/2016   HGBA1C 5.5 04/18/2016   HGBA1C 6.3 (  H) 01/02/2016   HGBA1C WILL FOLLOW 01/02/2016   HGBA1C 6.3 (H) 08/24/2015   Lab Results  Component Value Date   INSULIN 11.7 09/25/2016   INSULIN 21.8 04/18/2016   INSULIN 15.3 01/02/2016   INSULIN WILL FOLLOW 01/02/2016   CBC    Component Value Date/Time   WBC 9.2 04/18/2016 1201   WBC 10.7 (H) 09/26/2014 2012   RBC 4.50 04/18/2016 1201   RBC 4.53 09/26/2014 2012   HGB 13.7 04/18/2016 1201   HCT 40.8 04/18/2016 1201   PLT 392 (H) 04/18/2016 1201   MCV 91 04/18/2016 1201   MCH 30.4 04/18/2016 1201   MCH 30.0 09/26/2014 2012   MCHC 33.6 04/18/2016 1201   MCHC 34.0 09/26/2014 2012   RDW 12.8 04/18/2016 1201   LYMPHSABS 2.4 04/18/2016 1201   MONOABS 0.6 05/05/2014 0902   EOSABS 0.1 04/18/2016 1201   BASOSABS 0.0 04/18/2016 1201   Iron/TIBC/Ferritin/ %Sat No results found for: IRON, TIBC, FERRITIN, IRONPCTSAT Lipid Panel     Component Value Date/Time   CHOL 113 09/25/2016 0811   TRIG 73 09/25/2016 0811   HDL 39 (L) 09/25/2016 0811   CHOLHDL 3.4 05/05/2014 0902   VLDL 16 05/05/2014 0902   LDLCALC 59 09/25/2016 0811   Hepatic Function Panel     Component Value Date/Time   PROT 7.5 09/25/2016 0811   ALBUMIN 4.5 09/25/2016 0811   AST 16 09/25/2016 0811   ALT 17 09/25/2016 0811   ALKPHOS 105 09/25/2016 0811   BILITOT 0.7 09/25/2016 0811      Component Value Date/Time   TSH 3.260 09/25/2016 0811   TSH 2.090 04/18/2016 1201   TSH 2.940  01/02/2016 1503    ASSESSMENT AND PLAN: Type 2 diabetes mellitus without complication, without long-term current use of insulin (HCC)  Other depression - with emotional eating - Plan: buPROPion (WELLBUTRIN SR) 200 MG 12 hr tablet  At risk for heart disease  Class 2 severe obesity with serious comorbidity and body mass index (BMI) of 37.0 to 37.9 in adult, unspecified obesity type ([place])  PLAN:  Diabetes II Treena has been given extensive diabetes education by myself today including ideal fasting and post-prandial blood glucose readings, individual ideal Hgb A1c goals  and hypoglycemia prevention. We discussed the importance of good blood sugar control to decrease the likelihood of diabetic complications such as nephropathy, neuropathy, limb loss, blindness, coronary artery disease, and death. We discussed the importance of intensive lifestyle modification including diet, exercise and weight loss as the first line treatment for diabetes. She agrees to continue diet and exercise and we will recheck labs in 2 months. Alonnie agrees to continue her diabetes medications and will follow up at the agreed upon time.  Cardiovascular risk counseling Derika was given extended (15 minutes) coronary artery disease prevention counseling today. She is 46 y.o. female and has risk factors for heart disease including obesity and diabetes. We discussed intensive lifestyle modifications today with an emphasis on specific weight loss instructions and strategies. Pt was also informed of the importance of increasing exercise and decreasing saturated fats to help prevent heart disease.  Depression with Emotional Eating Behaviors We discussed behavior modification techniques today to help Analisia deal with her emotional eating and depression. She has agreed to continue Wellbutrin SR 150 mg qam #30 with no refills and will follow up as directed.  Obesity Shemika is currently in the action stage of change. As  such, her goal is to continue with weight loss efforts She has  agreed to follow a lower carbohydrate, vegetable and lean protein rich diet plan Onyx has been instructed to work up to a goal of 150 minutes of combined cardio and strengthening exercise per week for weight loss and overall health benefits. We discussed the following Behavioral Modification Strategies today: increasing lean protein intake, decreasing simple carbohydrates , decrease eating out, dealing with family or coworker sabotage and ways to avoid boredom eating  Shakiyla has agreed to follow up with our clinic in 2 to 3 weeks. She was informed of the importance of frequent follow up visits to maximize her success with intensive lifestyle modifications for her multiple health conditions.  I, Doreene Nest, am acting as transcriptionist for Dennard Nip, MD  I have reviewed the above documentation for accuracy and completeness, and I agree with the above. -Dennard Nip, MD

## 2016-11-06 DIAGNOSIS — F411 Generalized anxiety disorder: Secondary | ICD-10-CM | POA: Diagnosis not present

## 2016-11-12 ENCOUNTER — Ambulatory Visit (INDEPENDENT_AMBULATORY_CARE_PROVIDER_SITE_OTHER): Payer: 59 | Admitting: Family Medicine

## 2016-11-12 VITALS — BP 119/78 | HR 94 | Temp 97.7°F | Ht 65.0 in | Wt 222.0 lb

## 2016-11-12 DIAGNOSIS — Z6836 Body mass index (BMI) 36.0-36.9, adult: Secondary | ICD-10-CM | POA: Diagnosis not present

## 2016-11-12 DIAGNOSIS — F3289 Other specified depressive episodes: Secondary | ICD-10-CM | POA: Diagnosis not present

## 2016-11-12 DIAGNOSIS — E559 Vitamin D deficiency, unspecified: Secondary | ICD-10-CM | POA: Diagnosis not present

## 2016-11-12 DIAGNOSIS — F411 Generalized anxiety disorder: Secondary | ICD-10-CM | POA: Diagnosis not present

## 2016-11-12 DIAGNOSIS — Z9189 Other specified personal risk factors, not elsewhere classified: Secondary | ICD-10-CM | POA: Diagnosis not present

## 2016-11-12 DIAGNOSIS — E119 Type 2 diabetes mellitus without complications: Secondary | ICD-10-CM

## 2016-11-12 MED ORDER — BUPROPION HCL ER (SR) 200 MG PO TB12: 200.0000 mg | ORAL_TABLET | Freq: Every day | ORAL | 0 refills | Status: DC

## 2016-11-12 MED ORDER — LIRAGLUTIDE -WEIGHT MANAGEMENT 18 MG/3ML ~~LOC~~ SOPN: 3.0000 mg | PEN_INJECTOR | Freq: Every day | SUBCUTANEOUS | 0 refills | Status: DC

## 2016-11-12 MED ORDER — VITAMIN D (ERGOCALCIFEROL) 1.25 MG (50000 UNIT) PO CAPS: 50000.0000 [IU] | ORAL_CAPSULE | ORAL | 0 refills | Status: DC

## 2016-11-12 MED FILL — VIT D2 1.25 MG (50,000 UNIT: 1.25 MG | 21 days supply | Qty: 10 | Fill #0

## 2016-11-12 MED FILL — SAXENDA 18 MG/3 ML PEN: 18 | 30 days supply | Qty: 15 | Fill #0

## 2016-11-12 NOTE — Progress Notes (Signed)
Office: [phone]  /  Fax: [phone]   HPI:   Chief Complaint: OBESITY Shyteria is here to discuss her progress with her obesity treatment plan. She is on the follow a lower carbohydrate, vegetable and lean protein rich diet plan and is following her eating plan approximately 100 % of the time. She states she is exercising 0 minutes 0 times per week. Marveen continues to do well with weight loss on a lower simple carbohydrate plan. Her hunger is controlled but she is still struggling with same emotional eating. She walks 7,000 to 10,000 steps daily. She is doing well on saxenda and no hypoglycemia or polyphagia.  Her weight is 222 lb (100.7 kg) today and has had a weight loss of 4 pounds over a period of 2 to 3 weeks since her last visit. She has lost 38 lbs since starting treatment with [place].  Diabetes II Nathania has a diagnosis of diabetes type II. Evetta states fasting glucose ranging in 80's, she is doing well with diet and saxenda. She denies polyphagia and any hypoglycemic episodes. Last A1c was 4.9 on 09/25/16. She has been working on intensive lifestyle modifications including diet, exercise, and weight loss to help control her blood glucose levels.  At risk for cardiovascular disease Carmen is at a higher than average risk for cardiovascular disease due to obesity and diabetes II. She currently denies any chest pain.  Vitamin D deficiency Natilie has a diagnosis of vitamin D deficiency. She is currently taking prescription Vit D, not yet at goal and she denies nausea, vomiting or muscle weakness.  Depression with emotional eating behaviors Quintasia struggles with emotional eating and using food for comfort to the extent that it is negatively impacting her health. She often snacks when she is not hungry. Sherrey sometimes feels she is out of control and then feels guilty that she made poor food choices. She has been working on behavior modification techniques to help reduce  her emotional eating and has been somewhat successful. Her mood is stable on Wellbutrin and she is doing well avoiding emotional eating and she feels more content in general. She shows no sign of suicidal or homicidal ideations.  Depression screen [facility] 2/9 04/23/2016 01/02/2016 02/14/2015 06/23/2014  Decreased Interest 0 1 0 1  Down, Depressed, Hopeless 0 1 0 1  PHQ - 2 Score 0 2 0 2  Altered sleeping - 2 - 1  Tired, decreased energy - 3 - 1  Change in appetite - 3 - 0  Feeling bad or failure about yourself  - 3 - 0  Trouble concentrating - 0 - 0  Moving slowly or fidgety/restless - 0 - 0  Suicidal thoughts - 0 - 0  PHQ-9 Score - 13 - 4  Difficult doing work/chores - - - Not difficult at all   ALLERGIES: Allergies  Allergen Reactions  . Codeine Shortness Of Breath  . Hydrocodone Shortness Of Breath    Mild SOB    MEDICATIONS: Current Outpatient Prescriptions on File Prior to Visit  Medication Sig Dispense Refill  . ACCU-CHEK FASTCLIX LANCETS MISC 1 Bottle by Does not apply route every morning. 100 each 0  . atorvastatin (LIPITOR) 10 MG tablet Take 1 tablet (10 mg total) by mouth daily. 30 tablet 0  . buPROPion (WELLBUTRIN SR) 200 MG 12 hr tablet Take 1 tablet (200 mg total) by mouth daily. 30 tablet 0  . glucose blood (ACCU-CHEK GUIDE) test strip Use as instructed 100 each 0  . levonorgestrel (MIRENA)  20 MCG/24HR IUD 1 each by Intrauterine route once.    . Liraglutide -Weight Management (SAXENDA) 18 MG/3ML SOPN Inject 3 mg into the skin daily. 5 pen 0  . Melatonin 10 MG TABS Take 1 tablet by mouth at bedtime. 30 tablet 0  . NOVOFINE 32G X 6 MM MISC Inject 1 Stick as directed daily. 90 each 5  . TRUE METRIX BLOOD GLUCOSE TEST test strip 1 each by Other route daily. 1 stick daily 100 each 0  . TRUEPLUS LANCETS 30G MISC Inject 1 Stick as directed daily. 100 each 0  . Vitamin D, Ergocalciferol, (DRISDOL) 50000 units CAPS capsule Take 1 capsule (50,000 Units total) by mouth every 3 (three)  days. 10 capsule 0   No current facility-administered medications on file prior to visit.     PAST MEDICAL HISTORY: Past Medical History:  Diagnosis Date  . Anxiety   . Back pain   . Chest pain   . Depression   . Diabetes mellitus   . Diarrhea   . Edema    feet and legs  . Gallbladder problem   . 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 GERD (gastroesophageal reflux disease)   . Heartburn   . HTN (hypertension)   . Hyperlipidemia   . Obesity   . Palpitations   . PMDD (premenstrual dysphoric disorder) 01/02/2016  . Shortness of breath   . Vitamin D deficiency   . Vitamin D deficiency     PAST SURGICAL HISTORY: Past Surgical History:  Procedure Laterality Date  . APPENDECTOMY    . CHOLECYSTECTOMY    . EUS N/A 03/23/2014   Procedure: ESOPHAGEAL ENDOSCOPIC ULTRASOUND (EUS) RADIAL;  Surgeon: Arta Silence, MD;  Location: [facility];  Service: Endoscopy;  Laterality: N/A;  . FINE NEEDLE ASPIRATION N/A 03/23/2014   Procedure: FINE NEEDLE ASPIRATION (FNA) LINEAR;  Surgeon: Arta Silence, MD;  Location: [facility];  Service: Endoscopy;  Laterality: N/A;  . TONSILLECTOMY      SOCIAL HISTORY: Social History  Substance Use Topics  . Smoking status: Never Smoker  . Smokeless tobacco: Never Used  . Alcohol use No    FAMILY HISTORY: Family History  Problem Relation Age of Onset  . Healthy Mother   . Hypertension Mother   . Hyperlipidemia Mother   . Alcoholism Mother   . Anxiety disorder Mother   . Drug abuse Mother   . Healthy Father   . Colon cancer Paternal Grandfather 47       colon    ROS: Review of Systems  Constitutional: Positive for weight loss.  Gastrointestinal: Negative for nausea and vomiting.  Musculoskeletal:       Negative muscle weakness  Endo/Heme/Allergies:       Negative polyphagia Negative hypoglycemia  Psychiatric/Behavioral: Positive for depression. Negative for suicidal ideas.    PHYSICAL EXAM: Blood  pressure 119/78, pulse 94, temperature 97.7 F (36.5 C), temperature source Oral, height 5\' 5"  (1.651 m), weight 222 lb (100.7 kg), SpO2 93 %. Body mass index is 36.94 kg/m. Physical Exam  Constitutional: She is oriented to person, place, and time. She appears well-developed and well-nourished.  Cardiovascular: Normal rate.   Pulmonary/Chest: Effort normal.  Musculoskeletal: Normal range of motion.  Neurological: She is oriented to person, place, and time.  Skin: Skin is warm and dry.  Psychiatric: She has a normal mood and affect. Her behavior is normal.  Vitals reviewed.   RECENT LABS AND TESTS: BMET    Component Value  Date/Time   NA 141 09/25/2016 0811   K 3.9 09/25/2016 0811   CL 100 09/25/2016 0811   CO2 22 09/25/2016 0811   GLUCOSE 93 09/25/2016 0811   GLUCOSE 99 09/26/2014 2012   BUN 11 09/25/2016 0811   CREATININE 0.76 09/25/2016 0811   CREATININE 0.65 05/05/2014 0902   CALCIUM 9.6 09/25/2016 0811   GFRNONAA 96 09/25/2016 0811   GFRNONAA >89 05/05/2014 0902   GFRAA 110 09/25/2016 0811   GFRAA >89 05/05/2014 0902   Lab Results  Component Value Date   HGBA1C 4.9 09/25/2016   HGBA1C 5.5 04/18/2016   HGBA1C 6.3 (H) 01/02/2016   HGBA1C WILL FOLLOW 01/02/2016   HGBA1C 6.3 (H) 08/24/2015   Lab Results  Component Value Date   INSULIN 11.7 09/25/2016   INSULIN 21.8 04/18/2016   INSULIN 15.3 01/02/2016   INSULIN WILL FOLLOW 01/02/2016   CBC    Component Value Date/Time   WBC 9.2 04/18/2016 1201   WBC 10.7 (H) 09/26/2014 2012   RBC 4.50 04/18/2016 1201   RBC 4.53 09/26/2014 2012   HGB 13.7 04/18/2016 1201   HCT 40.8 04/18/2016 1201   PLT 392 (H) 04/18/2016 1201   MCV 91 04/18/2016 1201   MCH 30.4 04/18/2016 1201   MCH 30.0 09/26/2014 2012   MCHC 33.6 04/18/2016 1201   MCHC 34.0 09/26/2014 2012   RDW 12.8 04/18/2016 1201   LYMPHSABS 2.4 04/18/2016 1201   MONOABS 0.6 05/05/2014 0902   EOSABS 0.1 04/18/2016 1201   BASOSABS 0.0 04/18/2016 1201    Iron/TIBC/Ferritin/ %Sat No results found for: IRON, TIBC, FERRITIN, IRONPCTSAT Lipid Panel     Component Value Date/Time   CHOL 113 09/25/2016 0811   TRIG 73 09/25/2016 0811   HDL 39 (L) 09/25/2016 0811   CHOLHDL 3.4 05/05/2014 0902   VLDL 16 05/05/2014 0902   LDLCALC 59 09/25/2016 0811   Hepatic Function Panel     Component Value Date/Time   PROT 7.5 09/25/2016 0811   ALBUMIN 4.5 09/25/2016 0811   AST 16 09/25/2016 0811   ALT 17 09/25/2016 0811   ALKPHOS 105 09/25/2016 0811   BILITOT 0.7 09/25/2016 0811      Component Value Date/Time   TSH 3.260 09/25/2016 0811   TSH 2.090 04/18/2016 1201   TSH 2.940 01/02/2016 1503    ASSESSMENT AND PLAN: Type 2 diabetes mellitus without complication, without long-term current use of insulin (HCC)  Vitamin D deficiency - Plan: Vitamin D, Ergocalciferol, (DRISDOL) 50000 units CAPS capsule  Other depression - with emotional eating - Plan: buPROPion (WELLBUTRIN SR) 200 MG 12 hr tablet  At risk for heart disease  Class 2 severe obesity with serious comorbidity and body mass index (BMI) of 36.0 to 36.9 in adult, unspecified obesity type ([place]) - Plan: Liraglutide -Weight Management ([place]) 18 MG/3ML SOPN  PLAN:  Diabetes II Tatyanna has been given extensive diabetes education by myself today including ideal fasting and post-prandial blood glucose readings, individual ideal Hgb A1c goals and hypoglycemia prevention. We discussed the importance of good blood sugar control to decrease the likelihood of diabetic complications such as nephropathy, neuropathy, limb loss, blindness, coronary artery disease, and death. We discussed the importance of intensive lifestyle modification including diet, exercise and weight loss as the first line treatment for diabetes. Shereta agrees to continue saxenda and she agrees to follow up with our clinic in 3 weeks.   Cardiovascular risk counselling Zena was given extended (15 minutes) coronary artery  disease prevention counseling today. She  is 46 y.o. female and has risk factors for heart disease including obesity. We discussed intensive lifestyle modifications today with an emphasis on specific weight loss instructions and strategies. Pt was also informed of the importance of increasing exercise and decreasing saturated fats to help prevent heart disease.  Vitamin D Deficiency Elmarie was informed that low vitamin D levels contributes to fatigue and are associated with obesity, breast, and colon cancer. Fred agrees to continue taking prescription Vit D @50 ,000 IU every week #4 and we will refill for 1 month. She will follow up for routine testing of vitamin D, at least 2-3 times per year. She was informed of the risk of over-replacement of vitamin D and agrees to not increase her dose unless he discusses this with [place] first. Fabianna agrees to follow up with our clinic in 3 weeks.  Depression with Emotional Eating Behaviors We discussed behavior modification techniques today to help Vivien deal with her emotional eating and depression. Shariah agrees to continue taking  Wellbutrin SR 200 mg qd and we will refill for 1 month. She agrees to continue to work on emotional eating strategies and she will follow up with our clinic in 3 weeks.  Obesity Jaana is currently in the action stage of change. As such, her goal is to continue with weight loss efforts She has agreed to change to keep a food journal with 1000-1200 calories and 70+ grams of protein daily and sugar intake below 30 grams daily Ivyonna has been instructed to work up to a goal of 150 minutes of combined cardio and strengthening exercise per week for weight loss and overall health benefits. We discussed the following Behavioral Modification Strategies today: increasing lean protein intake, decreasing simple carbohydrates, dealing with family or coworker sabotage, and holiday eating strategies  We discussed various medication  options to help [name] with her weight loss efforts and we both agreed to continue saxenda 3 mg qd and we will refill for 1 month.  Paw has agreed to follow up with our clinic in 3 weeks. She was informed of the importance of frequent follow up visits to maximize her success with intensive lifestyle modifications for her multiple health conditions.  I, Trixie Dredge, am acting as transcriptionist for Dennard Nip, MD  I have reviewed the above documentation for accuracy and completeness, and I agree with the above. -Dennard Nip, MD

## 2016-11-18 MED FILL — BUPROPION HCL SR 200 MG TAB: 200 | 30 days supply | Qty: 30 | Fill #0

## 2016-11-19 DIAGNOSIS — F411 Generalized anxiety disorder: Secondary | ICD-10-CM | POA: Diagnosis not present

## 2016-11-26 DIAGNOSIS — F411 Generalized anxiety disorder: Secondary | ICD-10-CM | POA: Diagnosis not present

## 2016-12-02 ENCOUNTER — Ambulatory Visit (INDEPENDENT_AMBULATORY_CARE_PROVIDER_SITE_OTHER): Payer: 59 | Admitting: Family Medicine

## 2016-12-02 VITALS — BP 102/72 | HR 74 | Temp 97.8°F | Ht 65.0 in | Wt 216.0 lb

## 2016-12-02 DIAGNOSIS — F3289 Other specified depressive episodes: Secondary | ICD-10-CM

## 2016-12-02 DIAGNOSIS — Z6836 Body mass index (BMI) 36.0-36.9, adult: Secondary | ICD-10-CM

## 2016-12-02 DIAGNOSIS — E7849 Other hyperlipidemia: Secondary | ICD-10-CM | POA: Diagnosis not present

## 2016-12-02 DIAGNOSIS — Z9189 Other specified personal risk factors, not elsewhere classified: Secondary | ICD-10-CM

## 2016-12-02 DIAGNOSIS — E559 Vitamin D deficiency, unspecified: Secondary | ICD-10-CM | POA: Diagnosis not present

## 2016-12-02 MED ORDER — BUPROPION HCL ER (SR) 200 MG PO TB12: 200.0000 mg | ORAL_TABLET | Freq: Every day | ORAL | 0 refills | Status: DC

## 2016-12-02 MED ORDER — ATORVASTATIN CALCIUM 10 MG PO TABS: 10.0000 mg | ORAL_TABLET | Freq: Every day | ORAL | 0 refills | Status: DC

## 2016-12-02 MED ORDER — VITAMIN D (ERGOCALCIFEROL) 1.25 MG (50000 UNIT) PO CAPS: 50000.0000 [IU] | ORAL_CAPSULE | ORAL | 0 refills | Status: DC

## 2016-12-02 MED FILL — VIT D2 1.25 MG (50,000 UNIT: 1.25 MG | 21 days supply | Qty: 10 | Fill #0

## 2016-12-02 MED FILL — ATORVASTATIN 10 MG TABLET: 10 | 30 days supply | Qty: 30 | Fill #0

## 2016-12-12 NOTE — Progress Notes (Signed)
Office: [phone]  /  Fax: [phone]   HPI:   Chief Complaint: OBESITY Jasmine Martinez is here to discuss her progress with her obesity treatment plan. She is on the lower carbohydrate, vegetable and lean protein rich diet plan and is following her eating plan approximately 100 % of the time. She states she is exercising 0 minutes 0 times per week. Jasmine Martinez is doing well on her diet prescription and weight loss and she is tolerating Saxenda well. Her weight is 216 lb (98 kg) today and has had a weight loss of 6 pounds over a period of 3 weeks since her last visit. She has lost 44 lbs since starting treatment with [place].  Vitamin D deficiency Jasmine Martinez has a diagnosis of vitamin D deficiency. She is currently taking vit D and denies nausea, vomiting or muscle weakness.  Hyperlipidemia Jasmine Martinez has hyperlipidemia and is stable on Lipitor. She has been trying to improve her cholesterol levels with intensive lifestyle modification including a low saturated fat diet, exercise and weight loss. She denies any chest pain, claudication or myalgias.  At risk for cardiovascular disease Jasmine Martinez is at a higher than average risk for cardiovascular disease due to obesity and hyperlipidemia. She currently denies any chest pain.  Depression with emotional eating behaviors Jasmine Martinez is stable on Wellbutrin and emotional eating has decreased. She denies insomnia and her blood pressure is controlled. Jasmine Martinez struggles with emotional eating and using food for comfort to the extent that it is negatively impacting her health. She often snacks when she is not hungry. Jasmine Martinez sometimes feels she is out of control and then feels guilty that she made poor food choices. She has been working on behavior modification techniques to help reduce her emotional eating and has been somewhat successful. She shows no sign of suicidal or homicidal ideations.  Depression screen Jasmine Martinez    Decreased Interest 0 1 0 1  Down, Depressed, Hopeless 0 1 0 1  PHQ - 2 Score 0 2 0 2  Altered sleeping - 2 - 1  Tired, decreased energy - 3 - 1  Change in appetite - 3 - 0  Feeling bad or failure about yourself  - 3 - 0  Trouble concentrating - 0 - 0  Moving slowly or fidgety/restless - 0 - 0  Suicidal thoughts - 0 - 0  PHQ-9 Score - 13 - 4  Difficult doing work/chores - - - Not difficult at all      ALLERGIES: Allergies  Allergen Reactions  . Codeine Shortness Of Breath  . Hydrocodone Shortness Of Breath    Mild SOB    MEDICATIONS: Current Outpatient Medications on File Prior to Visit  Medication Sig Dispense Refill  . ACCU-CHEK FASTCLIX LANCETS MISC 1 Bottle by Does not apply route every morning. 100 each 0  . glucose blood (ACCU-CHEK GUIDE) test strip Use as instructed 100 each 0  . levonorgestrel (MIRENA) 20 MCG/24HR IUD 1 each by Intrauterine route once.    . Liraglutide -Weight Management (SAXENDA) 18 MG/3ML SOPN Inject 3 mg into the skin daily. 5 pen 0  . Melatonin 10 MG TABS Take 1 tablet by mouth at bedtime. 30 tablet 0  . NOVOFINE 32G X 6 MM MISC Inject 1 Stick as directed daily. 90 each 5  . TRUE METRIX BLOOD GLUCOSE TEST test strip 1 each by Other route daily. 1 stick daily 100 each 0  . TRUEPLUS LANCETS 30G MISC Inject 1 Stick as directed daily. 100 each  0   No current facility-administered medications on file prior to visit.     PAST MEDICAL HISTORY: Past Medical History:  Diagnosis Date  . Anxiety   . Back pain   . Chest pain   . Depression   . Diabetes mellitus   . Diarrhea   . Edema    feet and legs  . Gallbladder problem   . 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 GERD (gastroesophageal reflux disease)   . Heartburn   . HTN (hypertension)   . Hyperlipidemia   . Obesity   . Palpitations   . PMDD (premenstrual dysphoric disorder) 01/02/2016  . Shortness of breath   . Vitamin D deficiency   . Vitamin D deficiency      PAST SURGICAL HISTORY: Past Surgical History:  Procedure Laterality Date  . APPENDECTOMY    . CHOLECYSTECTOMY    . EUS N/A 03/23/2014   Procedure: ESOPHAGEAL ENDOSCOPIC ULTRASOUND (EUS) RADIAL;  Surgeon: Arta Silence, MD;  Location: [facility];  Service: Endoscopy;  Laterality: N/A;  . FINE NEEDLE ASPIRATION N/A 03/23/2014   Procedure: FINE NEEDLE ASPIRATION (FNA) LINEAR;  Surgeon: Arta Silence, MD;  Location: [facility];  Service: Endoscopy;  Laterality: N/A;  . TONSILLECTOMY      SOCIAL HISTORY: Social History   Tobacco Use  . Smoking status: Never Smoker  . Smokeless tobacco: Never Used  Substance Use Topics  . Alcohol use: No  . Drug use: No    FAMILY HISTORY: Family History  Problem Relation Age of Onset  . Healthy Mother   . Hypertension Mother   . Hyperlipidemia Mother   . Alcoholism Mother   . Anxiety disorder Mother   . Drug abuse Mother   . Healthy Father   . Colon cancer Paternal Grandfather 18       colon    ROS: Review of Systems  Constitutional: Positive for weight loss.  Cardiovascular: Negative for chest pain and claudication.  Gastrointestinal: Negative for nausea and vomiting.  Musculoskeletal: Negative for myalgias.       Negative muscle weakness  Psychiatric/Behavioral: Positive for depression. Negative for suicidal ideas. The patient does not have insomnia.     PHYSICAL EXAM: Blood pressure 102/72, pulse 74, temperature 97.8 F (36.6 C), height 5\' 5"  (1.651 m), weight 216 lb (98 kg), SpO2 95 %. Body mass index is 35.94 kg/m. Physical Exam  Constitutional: She is oriented to person, place, and time. She appears well-developed and well-nourished.  Cardiovascular: Normal rate.  Pulmonary/Chest: Effort normal.  Musculoskeletal: Normal range of motion.  Neurological: She is oriented to person, place, and time.  Skin: Skin is warm and dry.  Psychiatric: She has a normal mood and affect. Her behavior is normal.  Vitals  reviewed.   RECENT LABS AND TESTS: BMET    Component Value Date/Time   NA 141 09/25/2016 0811   K 3.9 09/25/2016 0811   CL 100 09/25/2016 0811   CO2 22 09/25/2016 0811   GLUCOSE 93 09/25/2016 0811   GLUCOSE 99 09/26/2014 2012   BUN 11 09/25/2016 0811   CREATININE 0.76 09/25/2016 0811   CREATININE 0.65 05/05/2014 0902   CALCIUM 9.6 09/25/2016 0811   GFRNONAA 96 09/25/2016 0811   GFRNONAA >89 05/05/2014 0902   GFRAA 110 09/25/2016 0811   GFRAA >89 05/05/2014 0902   Lab Results  Component Value Date   HGBA1C 4.9 09/25/2016   HGBA1C 5.5 04/18/2016   HGBA1C 6.3 (H) 01/02/2016   HGBA1C  WILL FOLLOW 01/02/2016   HGBA1C 6.3 (H) 08/24/2015   Lab Results  Component Value Date   INSULIN 11.7 09/25/2016   INSULIN 21.8 04/18/2016   INSULIN 15.3 01/02/2016   INSULIN WILL FOLLOW 01/02/2016   CBC    Component Value Date/Time   WBC 9.2 04/18/2016 1201   WBC 10.7 (H) 09/26/2014 2012   RBC 4.50 04/18/2016 1201   RBC 4.53 09/26/2014 2012   HGB 13.7 04/18/2016 1201   HCT 40.8 04/18/2016 1201   PLT 392 (H) 04/18/2016 1201   MCV 91 04/18/2016 1201   MCH 30.4 04/18/2016 1201   MCH 30.0 09/26/2014 2012   MCHC 33.6 04/18/2016 1201   MCHC 34.0 09/26/2014 2012   RDW 12.8 04/18/2016 1201   LYMPHSABS 2.4 04/18/2016 1201   MONOABS 0.6 05/05/2014 0902   EOSABS 0.1 04/18/2016 1201   BASOSABS 0.0 04/18/2016 1201   Iron/TIBC/Ferritin/ %Sat No results found for: IRON, TIBC, FERRITIN, IRONPCTSAT Lipid Panel     Component Value Date/Time   CHOL 113 09/25/2016 0811   TRIG 73 09/25/2016 0811   HDL 39 (L) 09/25/2016 0811   CHOLHDL 3.4 05/05/2014 0902   VLDL 16 05/05/2014 0902   LDLCALC 59 09/25/2016 0811   Hepatic Function Panel     Component Value Date/Time   PROT 7.5 09/25/2016 0811   ALBUMIN 4.5 09/25/2016 0811   AST 16 09/25/2016 0811   ALT 17 09/25/2016 0811   ALKPHOS 105 09/25/2016 0811   BILITOT 0.7 09/25/2016 0811      Component Value Date/Time   TSH 3.260 09/25/2016  0811   TSH 2.090 04/18/2016 1201   TSH 2.940 01/02/2016 1503    ASSESSMENT AND PLAN: Other hyperlipidemia - Plan: atorvastatin (LIPITOR) 10 MG tablet  Vitamin D deficiency - Plan: Vitamin D, Ergocalciferol, (DRISDOL) 50000 units CAPS capsule  Other depression - with emotional eating - Plan: buPROPion (WELLBUTRIN SR) 200 MG 12 hr tablet  At risk for heart disease  Class 2 severe obesity with serious comorbidity and body mass index (BMI) of 36.0 to 36.9 in adult, unspecified obesity type ([place])  PLAN:  Vitamin D Deficiency Sharee was informed that low vitamin D levels contributes to fatigue and are associated with obesity, breast, and colon cancer. She agrees to continue to take prescription Vit D @50 ,000 IU every 3 days #10 with no refills and will follow up for routine testing of vitamin D, at least 2-3 times per year. She was informed of the risk of over-replacement of vitamin D and agrees to not increase her dose unless he discusses this with [place] first. Tametra agrees to follow up with our clinic in 2 to 3 weeks.  Hyperlipidemia Dayshia was informed of the American Heart Association Guidelines emphasizing intensive lifestyle modifications as the first line treatment for hyperlipidemia. We discussed many lifestyle modifications today in depth, and Iyesha will continue to work on decreasing saturated fats such as fatty red meat, butter and many fried foods. She will also increase vegetables and lean protein in her diet and continue to work on exercise and weight loss efforts. Monasia agrees to continue Lipitor 10 mg qd #30 with no refills and follow up as directed.  Cardiovascular risk counseling Javaeh was given extended (15 minutes) coronary artery disease prevention counseling today. She is 46 y.o. female and has risk factors for heart disease including obesity and hyperlipidemia. We discussed intensive lifestyle modifications today with an emphasis on specific weight loss  instructions and strategies. Pt was also informed of the  importance of increasing exercise and decreasing saturated fats to help prevent heart disease.   Depression with Emotional Eating Behaviors We discussed behavior modification techniques today to help Hanan deal with her emotional eating and depression. She has agreed to continue Wellbutrin SR 200 mg qd #30 with no refills and follow up as directed.  Obesity Lenya is currently in the action stage of change. As such, her goal is to continue with weight loss efforts She has agreed to follow a lower carbohydrate, vegetable and lean protein rich diet plan Riyana has been instructed to work up to a goal of 150 minutes of combined cardio and strengthening exercise per week or start cardio and strengthening for 10 to 15 minutes per day for weight loss and overall health benefits. We discussed the following Behavioral Modification Strategies today: dealing with family or coworker sabotage and holiday eating strategies   We discussed various medication options to help Kryssa with her weight loss efforts and we both agreed to continue Saxenda, we will refill for 1 month  Yamel has agreed to follow up with our clinic in 2 to 3 weeks. She was informed of the importance of frequent follow up visits to maximize her success with intensive lifestyle modifications for her multiple health conditions.  I, Doreene Nest, am acting as transcriptionist for Dennard Nip, MD  I have reviewed the above documentation for accuracy and completeness, and I agree with the above. -Dennard Nip, MD

## 2016-12-13 DIAGNOSIS — F411 Generalized anxiety disorder: Secondary | ICD-10-CM | POA: Diagnosis not present

## 2016-12-17 ENCOUNTER — Ambulatory Visit (INDEPENDENT_AMBULATORY_CARE_PROVIDER_SITE_OTHER): Payer: 59 | Admitting: Family Medicine

## 2016-12-17 VITALS — BP 121/75 | HR 82 | Temp 98.2°F | Ht 65.0 in | Wt 214.0 lb

## 2016-12-17 DIAGNOSIS — Z9189 Other specified personal risk factors, not elsewhere classified: Secondary | ICD-10-CM

## 2016-12-17 DIAGNOSIS — F411 Generalized anxiety disorder: Secondary | ICD-10-CM | POA: Diagnosis not present

## 2016-12-17 DIAGNOSIS — Z6835 Body mass index (BMI) 35.0-35.9, adult: Secondary | ICD-10-CM | POA: Diagnosis not present

## 2016-12-17 DIAGNOSIS — E119 Type 2 diabetes mellitus without complications: Secondary | ICD-10-CM

## 2016-12-17 DIAGNOSIS — E66812 Obesity, class 2: Secondary | ICD-10-CM

## 2016-12-17 MED ORDER — GLUCOSE BLOOD VI STRP: ORAL_STRIP | 0 refills | Status: DC

## 2016-12-17 MED FILL — ACCU-CHEK GUIDE TEST STRIP: 90 days supply | Qty: 100 | Fill #0

## 2016-12-17 NOTE — Progress Notes (Signed)
Office: [phone]  /  Fax: [phone]   HPI:   Chief Complaint: OBESITY Jasmine Martinez is here to discuss her progress with her obesity treatment plan. Jasmine Martinez is on the lower carbohydrate, vegetable and lean protein rich diet plan and is following her eating plan approximately 100 % of the time. Jasmine Martinez states Jasmine Martinez is exercising weights, treadmill and gym for 30 minutes 1 times per week. Jasmine Martinez with weight loss, even with Thanksgiving and other celebrations. Jasmine Martinez is doing Martinez with meal planning and planning for sabotage. Her weight is 214 lb (97.1 kg) today and has had a weight loss of 2 pounds over a period of 2 weeks since her last visit. Jasmine Martinez has lost 46 lbs since starting treatment with [place].  Diabetes Martinez Jasmine Martinez. Jasmine Martinez states fasting BGs range between 80 and 100 and denies any hypoglycemic episodes. Jasmine Martinez is currently taking Liraglutide and A1c is very Martinez controlled.  Jasmine Martinez has been working on intensive lifestyle modifications including diet, exercise, and weight loss to help control her blood glucose levels.  At risk for cardiovascular disease Jasmine Martinez is at a higher than average risk for cardiovascular disease due to obesity and diabetes. Jasmine Martinez currently denies any chest pain.  ALLERGIES: Allergies  Allergen Reactions  . Codeine Shortness Of Breath  . Hydrocodone Shortness Of Breath    Mild SOB    MEDICATIONS: Current Outpatient Medications on File Prior to Visit  Medication Sig Dispense Refill  . ACCU-CHEK FASTCLIX LANCETS MISC 1 Bottle by Does not apply route every morning. 100 each 0  . atorvastatin (LIPITOR) 10 MG tablet Take 1 tablet (10 mg total) daily by mouth. 30 tablet 0  . buPROPion (WELLBUTRIN SR) 200 MG 12 hr tablet Take 1 tablet (200 mg total) daily by mouth. 30 tablet 0  . glucose blood (ACCU-CHEK GUIDE) test strip Use as instructed 100 each 0  . levonorgestrel (MIRENA) 20 MCG/24HR IUD 1 each by Intrauterine route  once.    . Liraglutide -Weight Management (SAXENDA) 18 MG/3ML SOPN Inject 3 mg into the skin daily. 5 pen 0  . Melatonin 10 MG TABS Take 1 tablet by mouth at bedtime. 30 tablet 0  . NOVOFINE 32G X 6 MM MISC Inject 1 Stick as directed daily. 90 each 5  . TRUE METRIX BLOOD GLUCOSE TEST test strip 1 each by Other route daily. 1 stick daily 100 each 0  . TRUEPLUS LANCETS 30G MISC Inject 1 Stick as directed daily. 100 each 0  . Vitamin D, Ergocalciferol, (DRISDOL) 50000 units CAPS capsule Take 1 capsule (50,000 Units total) every 3 (three) days by mouth. 10 capsule 0   No current facility-administered medications on file prior to visit.     PAST MEDICAL HISTORY: Past Medical History:  Diagnosis Date  . Anxiety   . Back pain   . Chest pain   . Depression   . Diabetes mellitus   . Diarrhea   . Edema    feet and legs  . Gallbladder problem   . 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 GERD (gastroesophageal reflux disease)   . Heartburn   . HTN (hypertension)   . Hyperlipidemia   . Obesity   . Palpitations   . PMDD (premenstrual dysphoric disorder) 01/02/2016  . Shortness of breath   . Vitamin D deficiency   . Vitamin D deficiency     PAST SURGICAL HISTORY: Past Surgical History:  Procedure  Laterality Date  . APPENDECTOMY    . CHOLECYSTECTOMY    . EUS N/A 03/23/2014   Procedure: ESOPHAGEAL ENDOSCOPIC ULTRASOUND (EUS) RADIAL;  Surgeon: Arta Silence, MD;  Location: [facility];  Service: Endoscopy;  Laterality: N/A;  . FINE NEEDLE ASPIRATION N/A 03/23/2014   Procedure: FINE NEEDLE ASPIRATION (FNA) LINEAR;  Surgeon: Arta Silence, MD;  Location: [facility];  Service: Endoscopy;  Laterality: N/A;  . TONSILLECTOMY      SOCIAL HISTORY: Social History   Tobacco Use  . Smoking status: Never Smoker  . Smokeless tobacco: Never Used  Substance Use Topics  . Alcohol use: No  . Drug use: No    FAMILY HISTORY: Family History  Problem Relation Age of  Onset  . Healthy Mother   . Hypertension Mother   . Hyperlipidemia Mother   . Alcoholism Mother   . Anxiety disorder Mother   . Drug abuse Mother   . Healthy Father   . Colon cancer Paternal Grandfather 31       colon    ROS: Review of Systems  Constitutional: Positive for weight loss.  Cardiovascular: Negative for chest pain.  Endo/Heme/Allergies:       Negative hypoglycemia    PHYSICAL EXAM: Blood pressure 121/75, pulse 82, temperature 98.2 F (36.8 C), temperature source Oral, height 5\' 5"  (1.651 m), weight 214 lb (97.1 kg), SpO2 99 %. Body mass index is 35.61 kg/m. Physical Exam  Constitutional: Jasmine Martinez is oriented to person, place, and time. Jasmine Martinez appears Martinez-developed and Martinez-nourished.  Cardiovascular: Normal rate.  Pulmonary/Chest: Effort normal.  Musculoskeletal: Normal range of motion.  Neurological: Jasmine Martinez is oriented to person, place, and time.  Skin: Skin is warm and dry.  Psychiatric: Jasmine Martinez has a normal mood and affect. Her behavior is normal.  Vitals reviewed.   RECENT LABS AND TESTS: BMET    Component Value Date/Time   NA 141 09/25/2016 0811   K 3.9 09/25/2016 0811   CL 100 09/25/2016 0811   CO2 22 09/25/2016 0811   GLUCOSE 93 09/25/2016 0811   GLUCOSE 99 09/26/2014 2012   BUN 11 09/25/2016 0811   CREATININE 0.76 09/25/2016 0811   CREATININE 0.65 05/05/2014 0902   CALCIUM 9.6 09/25/2016 0811   GFRNONAA 96 09/25/2016 0811   GFRNONAA >89 05/05/2014 0902   GFRAA 110 09/25/2016 0811   GFRAA >89 05/05/2014 0902   Lab Results  Component Value Date   HGBA1C 4.9 09/25/2016   HGBA1C 5.5 04/18/2016   HGBA1C 6.3 (H) 01/02/2016   HGBA1C WILL FOLLOW 01/02/2016   HGBA1C 6.3 (H) 08/24/2015   Lab Results  Component Value Date   INSULIN 11.7 09/25/2016   INSULIN 21.8 04/18/2016   INSULIN 15.3 01/02/2016   INSULIN WILL FOLLOW 01/02/2016   CBC    Component Value Date/Time   WBC 9.2 04/18/2016 1201   WBC 10.7 (H) 09/26/2014 2012   RBC 4.50 04/18/2016 1201    RBC 4.53 09/26/2014 2012   HGB 13.7 04/18/2016 1201   HCT 40.8 04/18/2016 1201   PLT 392 (H) 04/18/2016 1201   MCV 91 04/18/2016 1201   MCH 30.4 04/18/2016 1201   MCH 30.0 09/26/2014 2012   MCHC 33.6 04/18/2016 1201   MCHC 34.0 09/26/2014 2012   RDW 12.8 04/18/2016 1201   LYMPHSABS 2.4 04/18/2016 1201   MONOABS 0.6 05/05/2014 0902   EOSABS 0.1 04/18/2016 1201   BASOSABS 0.0 04/18/2016 1201   Iron/TIBC/Ferritin/ %Sat No results found for: IRON, TIBC, FERRITIN, IRONPCTSAT Lipid Panel  Component Value Date/Time   CHOL 113 09/25/2016 0811   TRIG 73 09/25/2016 0811   HDL 39 (L) 09/25/2016 0811   CHOLHDL 3.4 05/05/2014 0902   VLDL 16 05/05/2014 0902   LDLCALC 59 09/25/2016 0811   Hepatic Function Panel     Component Value Date/Time   PROT 7.5 09/25/2016 0811   ALBUMIN 4.5 09/25/2016 0811   AST 16 09/25/2016 0811   ALT 17 09/25/2016 0811   ALKPHOS 105 09/25/2016 0811   BILITOT 0.7 09/25/2016 0811      Component Value Date/Time   TSH 3.260 09/25/2016 0811   TSH 2.090 04/18/2016 1201   TSH 2.940 01/02/2016 1503    ASSESSMENT AND PLAN: Type 2 diabetes mellitus without complication, without long-term current use of insulin ([place]) - Plan: glucose blood (ACCU-CHEK GUIDE) test strip  At risk for heart disease  Class 2 severe obesity with serious comorbidity and body mass index (BMI) of 35.0 to 35.9 in adult, unspecified obesity type ([place])  PLAN:  Diabetes Martinez Jasmine Martinez has been given extensive diabetes education by myself today including ideal fasting and post-prandial blood glucose readings, individual ideal Hgb A1c goals  and hypoglycemia prevention. We discussed the importance of good blood sugar control to decrease the likelihood of diabetic complications such as nephropathy, neuropathy, limb loss, blindness, coronary artery disease, and death. We discussed the importance of intensive lifestyle modification including diet, exercise and weight loss as the first line  treatment for diabetes. Jasmine Martinez agrees to continue Liraglutide as prescribed and we will refill test strips #100 with no refills. Jasmine Martinez agrees to follow up with our clinic in 2 weeks.  Cardiovascular risk counseling Jasmine Martinez was given extended (15 minutes) coronary artery disease prevention counseling today. Jasmine Martinez is 46 y.o. female and has risk factors for heart disease including obesity and diabetes. We discussed intensive lifestyle modifications today with an emphasis on specific weight loss instructions and strategies. Pt was also informed of the importance of increasing exercise and decreasing saturated fats to help prevent heart disease.  Obesity Jasmine Martinez is currently in the action stage of change. As such, her goal is to continue with weight loss efforts Jasmine Martinez has agreed to follow a lower carbohydrate, vegetable and lean protein rich diet plan Kylie has been instructed to work up to a goal of 150 minutes of combined cardio and strengthening exercise per week for weight loss and overall health benefits. We discussed the following Behavioral Modification Strategies today: holiday eating strategies and no skipping meals  Jeriann has agreed to follow up with our clinic in 2 weeks. Jasmine Martinez was informed of the importance of frequent follow up visits to maximize her success with intensive lifestyle modifications for her multiple health conditions.  I, Doreene Nest, am acting as transcriptionist for Dennard Nip, MD  I have reviewed the above documentation for accuracy and completeness, and I agree with the above. -Dennard Nip, MD

## 2016-12-19 ENCOUNTER — Other Ambulatory Visit: Payer: Self-pay

## 2016-12-19 NOTE — Patient Outreach (Signed)
[facility]) Care Management  12/19/2016  RAELLE CHAMBERS 1970-12-10 [id number]   Case closed in [place].  Member is being followed by the [employer] program Member enrolled in an external program. Peter Garter RN, [facility] Care Management Coordinator-Link to [place] Management [phone]

## 2016-12-31 ENCOUNTER — Ambulatory Visit (INDEPENDENT_AMBULATORY_CARE_PROVIDER_SITE_OTHER): Payer: 59 | Admitting: Family Medicine

## 2016-12-31 VITALS — BP 115/75 | HR 77 | Temp 98.0°F | Ht 65.0 in | Wt 209.0 lb

## 2016-12-31 DIAGNOSIS — E7849 Other hyperlipidemia: Secondary | ICD-10-CM

## 2016-12-31 DIAGNOSIS — E669 Obesity, unspecified: Secondary | ICD-10-CM | POA: Diagnosis not present

## 2016-12-31 DIAGNOSIS — Z6834 Body mass index (BMI) 34.0-34.9, adult: Secondary | ICD-10-CM | POA: Diagnosis not present

## 2016-12-31 DIAGNOSIS — F411 Generalized anxiety disorder: Secondary | ICD-10-CM | POA: Diagnosis not present

## 2016-12-31 DIAGNOSIS — E559 Vitamin D deficiency, unspecified: Secondary | ICD-10-CM

## 2016-12-31 DIAGNOSIS — E119 Type 2 diabetes mellitus without complications: Secondary | ICD-10-CM

## 2016-12-31 DIAGNOSIS — Z9189 Other specified personal risk factors, not elsewhere classified: Secondary | ICD-10-CM | POA: Diagnosis not present

## 2016-12-31 MED ORDER — ATORVASTATIN CALCIUM 10 MG PO TABS: 10.0000 mg | ORAL_TABLET | Freq: Every day | ORAL | 0 refills | Status: DC

## 2016-12-31 MED ORDER — VITAMIN D (ERGOCALCIFEROL) 1.25 MG (50000 UNIT) PO CAPS: 50000.0000 [IU] | ORAL_CAPSULE | ORAL | 0 refills | Status: DC

## 2016-12-31 MED FILL — VIT D2 1.25 MG (50,000 UNIT: 1.25 MG | 30 days supply | Qty: 10 | Fill #0

## 2016-12-31 MED FILL — ATORVASTATIN 10 MG TABLET: 10 | 30 days supply | Qty: 30 | Fill #0

## 2016-12-31 NOTE — Progress Notes (Signed)
Office: [phone]  /  Fax: [phone]   HPI:   Chief Complaint: OBESITY Jasmine Martinez is here to discuss her progress with her obesity treatment plan. She is on the lower carbohydrate, vegetable and lean protein rich diet plan and is following her eating plan approximately 90 to 100 % of the time. She states she is exercising 0 minutes 0 times per week. Jasmine Martinez continues to do well with weight loss on the low carbohydrate plan. She has done some celebration eating, but is doing well with controlling her portions and making smarter food choices. She is on Saxenda and denies GI upset or hypoglycemia. Her weight is 209 lb (94.8 kg) today and has had a weight loss of 5 pounds over a period of 2 weeks since her last visit. She has lost 51 lbs since starting treatment with [place].  Hyperlipidemia Jasmine Martinez has hyperlipidemia and is on Lipitor. She is attempting to control her cholesterol levels with intensive lifestyle modification including a low saturated fat diet, exercise and weight loss. She denies any chest pain, claudication or myalgias.  Diabetes II Jasmine Martinez has a diagnosis of diabetes type II. Jasmine Martinez did not bring her blood sugar log today, but she has been very well controlled with diet and Liraglutide. Jasmine Martinez denies any hypoglycemic episodes. She has been working on intensive lifestyle modifications including diet, exercise, and weight loss to help control her blood glucose levels.  At risk for cardiovascular disease Jasmine Martinez is at a higher than average risk for cardiovascular disease due to obesity, hyperlipidemia and diabetes. She currently denies any chest pain.  Vitamin D deficiency Jasmine Martinez has a diagnosis of vitamin D deficiency. She is on vit D prescription, not yet at goal and denies nausea, vomiting or muscle weakness. Jasmine Martinez is due for labs.  ALLERGIES: Allergies  Allergen Reactions  . Codeine Shortness Of Breath  . Hydrocodone Shortness Of Breath    Mild SOB     MEDICATIONS: Current Outpatient Medications on File Prior to Visit  Medication Sig Dispense Refill  . ACCU-CHEK FASTCLIX LANCETS MISC 1 Bottle by Does not apply route every morning. 100 each 0  . atorvastatin (LIPITOR) 10 MG tablet Take 1 tablet (10 mg total) daily by mouth. 30 tablet 0  . buPROPion (WELLBUTRIN SR) 200 MG 12 hr tablet Take 1 tablet (200 mg total) daily by mouth. 30 tablet 0  . glucose blood (ACCU-CHEK GUIDE) test strip Use as instructed 100 each 0  . levonorgestrel (MIRENA) 20 MCG/24HR IUD 1 each by Intrauterine route once.    . Liraglutide -Weight Management (SAXENDA) 18 MG/3ML SOPN Inject 3 mg into the skin daily. 5 pen 0  . Melatonin 10 MG TABS Take 1 tablet by mouth at bedtime. 30 tablet 0  . NOVOFINE 32G X 6 MM MISC Inject 1 Stick as directed daily. 90 each 5  . TRUE METRIX BLOOD GLUCOSE TEST test strip 1 each by Other route daily. 1 stick daily 100 each 0  . TRUEPLUS LANCETS 30G MISC Inject 1 Stick as directed daily. 100 each 0  . Vitamin D, Ergocalciferol, (DRISDOL) 50000 units CAPS capsule Take 1 capsule (50,000 Units total) every 3 (three) days by mouth. 10 capsule 0   No current facility-administered medications on file prior to visit.     PAST MEDICAL HISTORY: Past Medical History:  Diagnosis Date  . Anxiety   . Back pain   . Chest pain   . Depression   . Diabetes mellitus   . Diarrhea   . Edema  feet and legs  . Gallbladder problem   . 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 GERD (gastroesophageal reflux disease)   . Heartburn   . HTN (hypertension)   . Hyperlipidemia   . Obesity   . Palpitations   . PMDD (premenstrual dysphoric disorder) 01/02/2016  . Shortness of breath   . Vitamin D deficiency   . Vitamin D deficiency     PAST SURGICAL HISTORY: Past Surgical History:  Procedure Laterality Date  . APPENDECTOMY    . CHOLECYSTECTOMY    . EUS N/A 03/23/2014   Procedure: ESOPHAGEAL ENDOSCOPIC ULTRASOUND (EUS)  RADIAL;  Surgeon: Arta Silence, MD;  Location: [facility];  Service: Endoscopy;  Laterality: N/A;  . FINE NEEDLE ASPIRATION N/A 03/23/2014   Procedure: FINE NEEDLE ASPIRATION (FNA) LINEAR;  Surgeon: Arta Silence, MD;  Location: [facility];  Service: Endoscopy;  Laterality: N/A;  . TONSILLECTOMY      SOCIAL HISTORY: Social History   Tobacco Use  . Smoking status: Never Smoker  . Smokeless tobacco: Never Used  Substance Use Topics  . Alcohol use: No  . Drug use: No    FAMILY HISTORY: Family History  Problem Relation Age of Onset  . Healthy Mother   . Hypertension Mother   . Hyperlipidemia Mother   . Alcoholism Mother   . Anxiety disorder Mother   . Drug abuse Mother   . Healthy Father   . Colon cancer Paternal Grandfather 28       colon    ROS: Review of Systems  Constitutional: Positive for weight loss.  Cardiovascular: Negative for chest pain and claudication.  Gastrointestinal: Negative for diarrhea, nausea and vomiting.  Musculoskeletal: Negative for myalgias.       Negative muscle weakness  Endo/Heme/Allergies:       Negative hypoglycemia    PHYSICAL EXAM: Blood pressure 115/75, pulse 77, temperature 98 F (36.7 C), height 5\' 5"  (1.651 m), weight 209 lb (94.8 kg), SpO2 100 %. Body mass index is 34.78 kg/m. Physical Exam  Constitutional: She is oriented to person, place, and time. She appears well-developed and well-nourished.  Cardiovascular: Normal rate.  Pulmonary/Chest: Effort normal.  Musculoskeletal: Normal range of motion.  Neurological: She is oriented to person, place, and time.  Skin: Skin is warm.  Psychiatric: She has a normal mood and affect. Her behavior is normal.  Vitals reviewed.   RECENT LABS AND TESTS: BMET    Component Value Date/Time   NA 141 09/25/2016 0811   K 3.9 09/25/2016 0811   CL 100 09/25/2016 0811   CO2 22 09/25/2016 0811   GLUCOSE 93 09/25/2016 0811   GLUCOSE 99 09/26/2014 2012   BUN 11 09/25/2016 0811    CREATININE 0.76 09/25/2016 0811   CREATININE 0.65 05/05/2014 0902   CALCIUM 9.6 09/25/2016 0811   GFRNONAA 96 09/25/2016 0811   GFRNONAA >89 05/05/2014 0902   GFRAA 110 09/25/2016 0811   GFRAA >89 05/05/2014 0902   Lab Results  Component Value Date   HGBA1C 4.9 09/25/2016   HGBA1C 5.5 04/18/2016   HGBA1C 6.3 (H) 01/02/2016   HGBA1C WILL FOLLOW 01/02/2016   HGBA1C 6.3 (H) 08/24/2015   Lab Results  Component Value Date   INSULIN 11.7 09/25/2016   INSULIN 21.8 04/18/2016   INSULIN 15.3 01/02/2016   INSULIN WILL FOLLOW 01/02/2016   CBC    Component Value Date/Time   WBC 9.2 04/18/2016 1201   WBC 10.7 (H) 09/26/2014 2012   RBC 4.50 04/18/2016  1201   RBC 4.53 09/26/2014 2012   HGB 13.7 04/18/2016 1201   HCT 40.8 04/18/2016 1201   PLT 392 (H) 04/18/2016 1201   MCV 91 04/18/2016 1201   MCH 30.4 04/18/2016 1201   MCH 30.0 09/26/2014 2012   MCHC 33.6 04/18/2016 1201   MCHC 34.0 09/26/2014 2012   RDW 12.8 04/18/2016 1201   LYMPHSABS 2.4 04/18/2016 1201   MONOABS 0.6 05/05/2014 0902   EOSABS 0.1 04/18/2016 1201   BASOSABS 0.0 04/18/2016 1201   Iron/TIBC/Ferritin/ %Sat No results found for: IRON, TIBC, FERRITIN, IRONPCTSAT Lipid Panel     Component Value Date/Time   CHOL 113 09/25/2016 0811   TRIG 73 09/25/2016 0811   HDL 39 (L) 09/25/2016 0811   CHOLHDL 3.4 05/05/2014 0902   VLDL 16 05/05/2014 0902   LDLCALC 59 09/25/2016 0811   Hepatic Function Panel     Component Value Date/Time   PROT 7.5 09/25/2016 0811   ALBUMIN 4.5 09/25/2016 0811   AST 16 09/25/2016 0811   ALT 17 09/25/2016 0811   ALKPHOS 105 09/25/2016 0811   BILITOT 0.7 09/25/2016 0811      Component Value Date/Time   TSH 3.260 09/25/2016 0811   TSH 2.090 04/18/2016 1201   TSH 2.940 01/02/2016 1503    ASSESSMENT AND PLAN: Other hyperlipidemia - Plan: Lipid Panel With LDL/HDL Ratio, atorvastatin (LIPITOR) 10 MG tablet  Vitamin D deficiency - Plan: VITAMIN D 25 Hydroxy (Vit-D Deficiency,  Fractures), Vitamin D, Ergocalciferol, (DRISDOL) 50000 units CAPS capsule  Type 2 diabetes mellitus without complication, without long-term current use of insulin ([facility]) - Plan: Hemoglobin A1c, Insulin, random  At risk for heart disease  Class 1 obesity with serious comorbidity and body mass index (BMI) of 34.0 to 34.9 in adult, unspecified obesity type  PLAN:  Hyperlipidemia Jasmine Martinez was informed of the American Heart Association Guidelines emphasizing intensive lifestyle modifications as the first line treatment for hyperlipidemia. We discussed many lifestyle modifications today in depth, and Jasmine Martinez will continue to work on decreasing saturated fats such as fatty red meat, butter and many fried foods. She will also increase vegetables and lean protein in her diet and continue to work on exercise and weight loss efforts. We will check labs and Jasmine Martinez agrees to continue Lipitor 10 mg qd #30 with no refills. Jasmine Martinez agrees to follow up with our clinic in 3 weeks..  Diabetes II Jasmine Martinez has been given extensive diabetes education by myself today including ideal fasting and post-prandial blood glucose readings, individual ideal Hgb A1c goals  and hypoglycemia prevention. We discussed the importance of good blood sugar control to decrease the likelihood of diabetic complications such as nephropathy, neuropathy, limb loss, blindness, coronary artery disease, and death. We discussed the importance of intensive lifestyle modification including diet, exercise and weight loss as the first line treatment for diabetes. We will check labs and Jasmine Martinez agrees to continue her diabetes medications, diet and exercise and will follow up at the agreed upon time.  Cardiovascular risk counseling Jasmine Martinez was given extended (15 minutes) coronary artery disease prevention counseling today. She is 46 y.o. female and has risk factors for heart disease including obesity, hyperlipidemia and diabetes. We discussed  intensive lifestyle modifications today with an emphasis on specific weight loss instructions and strategies. Pt was also informed of the importance of increasing exercise and decreasing saturated fats to help prevent heart disease.  Vitamin D Deficiency Jasmine Martinez was informed that low vitamin D levels contributes to fatigue and are associated with obesity,  breast, and colon cancer. She agrees to continue to take prescription Vit D @50 ,000 IU every 3 days #10 with no refills. We will check labs and will follow up for routine testing of vitamin D, at least 2-3 times per year. She was informed of the risk of over-replacement of vitamin D and agrees to not increase her dose unless he discusses this with [place] first. Jasmine Martinez agrees to follow up with our clinic in 3 weeks.  Obesity Jasmine Martinez is currently in the action stage of change. As such, her goal is to continue with weight loss efforts She has agreed to follow a lower carbohydrate, vegetable and lean protein rich diet plan Jasmine Martinez has been instructed to work up to a goal of 150 minutes of combined cardio and strengthening exercise per week for weight loss and overall health benefits. We discussed the following Behavioral Modification Strategies today: dealing with family or coworker sabotage and holiday eating strategies  We discussed various medication options to help Jasmine Martinez with her weight loss efforts and we both agreed to continue Saxenda. Prescription was written today for 1 month refill.  Jasmine Martinez has agreed to follow up with our clinic in 3 weeks. She was informed of the importance of frequent follow up visits to maximize her success with intensive lifestyle modifications for her multiple health conditions.  I, Doreene Nest, am acting as transcriptionist for Dennard Nip, MD  I have reviewed the above documentation for accuracy and completeness, and I agree with the above. -Dennard Nip, MD

## 2017-01-02 ENCOUNTER — Encounter (INDEPENDENT_AMBULATORY_CARE_PROVIDER_SITE_OTHER): Payer: Self-pay

## 2017-01-09 ENCOUNTER — Other Ambulatory Visit (INDEPENDENT_AMBULATORY_CARE_PROVIDER_SITE_OTHER): Payer: Self-pay | Admitting: Family Medicine

## 2017-01-09 ENCOUNTER — Other Ambulatory Visit (INDEPENDENT_AMBULATORY_CARE_PROVIDER_SITE_OTHER): Payer: Self-pay

## 2017-01-09 DIAGNOSIS — Z6836 Body mass index (BMI) 36.0-36.9, adult: Principal | ICD-10-CM

## 2017-01-09 MED ORDER — LIRAGLUTIDE -WEIGHT MANAGEMENT 18 MG/3ML ~~LOC~~ SOPN: 3.0000 mg | PEN_INJECTOR | Freq: Every day | SUBCUTANEOUS | 0 refills | Status: DC

## 2017-01-09 MED FILL — SAXENDA 18 MG/3 ML PEN: 18 | 30 days supply | Qty: 15 | Fill #0

## 2017-01-17 DIAGNOSIS — F411 Generalized anxiety disorder: Secondary | ICD-10-CM | POA: Diagnosis not present

## 2017-01-20 DIAGNOSIS — E559 Vitamin D deficiency, unspecified: Secondary | ICD-10-CM | POA: Diagnosis not present

## 2017-01-20 DIAGNOSIS — E119 Type 2 diabetes mellitus without complications: Secondary | ICD-10-CM | POA: Diagnosis not present

## 2017-01-20 DIAGNOSIS — E7849 Other hyperlipidemia: Secondary | ICD-10-CM | POA: Diagnosis not present

## 2017-01-21 ENCOUNTER — Ambulatory Visit (INDEPENDENT_AMBULATORY_CARE_PROVIDER_SITE_OTHER): Payer: 59 | Admitting: Family Medicine

## 2017-01-21 VITALS — BP 115/70 | HR 75 | Temp 98.4°F | Ht 65.0 in | Wt 205.0 lb

## 2017-01-21 DIAGNOSIS — F3289 Other specified depressive episodes: Secondary | ICD-10-CM

## 2017-01-21 DIAGNOSIS — F411 Generalized anxiety disorder: Secondary | ICD-10-CM | POA: Diagnosis not present

## 2017-01-21 DIAGNOSIS — Z6834 Body mass index (BMI) 34.0-34.9, adult: Secondary | ICD-10-CM | POA: Diagnosis not present

## 2017-01-21 DIAGNOSIS — E559 Vitamin D deficiency, unspecified: Secondary | ICD-10-CM

## 2017-01-21 DIAGNOSIS — E669 Obesity, unspecified: Secondary | ICD-10-CM | POA: Diagnosis not present

## 2017-01-21 LAB — LIPID PANEL WITH LDL/HDL RATIO
CHOLESTEROL TOTAL: 143 mg/dL (ref 100–199)
HDL: 37 mg/dL — ABNORMAL LOW (ref >39)
LDL CALC: 92 mg/dL (ref 0–99)
LDl/HDL Ratio: 2.5 ratio (ref 0.0–3.2)
Triglycerides: 68 mg/dL (ref 0–149)
VLDL Cholesterol Cal: 14 mg/dL (ref 5–40)

## 2017-01-21 LAB — INSULIN, RANDOM: INSULIN: 11 u[IU]/mL (ref 2.6–24.9)

## 2017-01-21 LAB — HEMOGLOBIN A1C
Est. average glucose Bld gHb Est-mCnc: 88 mg/dL
Hgb A1c MFr Bld: 4.7 % — ABNORMAL LOW (ref 4.8–5.6)

## 2017-01-21 LAB — VITAMIN D 25 HYDROXY (VIT D DEFICIENCY, FRACTURES): Vit D, 25-Hydroxy: 52.3 ng/mL (ref 30.0–100.0)

## 2017-01-21 MED ORDER — BUPROPION HCL ER (SR) 200 MG PO TB12: 200.0000 mg | ORAL_TABLET | Freq: Every day | ORAL | 0 refills | Status: DC

## 2017-01-21 MED ORDER — VITAMIN D (ERGOCALCIFEROL) 1.25 MG (50000 UNIT) PO CAPS: 50000.0000 [IU] | ORAL_CAPSULE | ORAL | 0 refills | Status: DC

## 2017-01-21 MED ORDER — LIRAGLUTIDE -WEIGHT MANAGEMENT 18 MG/3ML ~~LOC~~ SOPN: 3.0000 mg | PEN_INJECTOR | Freq: Every day | SUBCUTANEOUS | 0 refills | Status: DC

## 2017-01-22 MED FILL — BUPROPION HCL SR 200 MG TAB: 200 | 30 days supply | Qty: 30 | Fill #0

## 2017-01-22 NOTE — Progress Notes (Signed)
Office: [phone]  /  Fax: [phone]   HPI:   Chief Complaint: OBESITY Jasmine Martinez is here to discuss her progress with her obesity treatment plan. She is on the lower carbohydrate, vegetable and lean protein rich diet plan and is following her eating plan approximately 100 % of the time. She states she is exercising 0 minutes 0 times per week. Jasmine Martinez continues to do very well with weight loss and continues to do well with lower carbohydrate plan. She is tolerating Saxenda well and not GI upset. She is doing better with handling family sabotage.  Her weight is 205 lb (93 kg) today and has had a weight loss of 4 pounds over a period of 3 weeks since her last visit. She has lost 55 lbs since starting treatment with [place].  Vitamin D deficiency Jasmine Martinez has a diagnosis of vitamin D deficiency. She is stable on high dose prescription Vit D 2 times per week and level is now at goal but just barely. She denies nausea, vomiting or muscle weakness.  Depression with emotional eating behaviors Jasmine Martinez's mood has improved on Wellbutrin and she feels more in control of her diet and emotional eating. She has less family stress and she feels more confident of her ability to control her life. Jasmine Martinez struggles with emotional eating and using food for comfort to the extent that it is negatively impacting her health. She often snacks when she is not hungry. Jasmine Martinez sometimes feels she is out of control and then feels guilty that she made poor food choices. She has been working on behavior modification techniques to help reduce her emotional eating and has been somewhat successful. She shows no sign of suicidal or homicidal ideations.  Depression screen Jasmine Martinez 2/9 04/23/2016 01/02/2016 02/14/2015 06/23/2014  Decreased Interest 0 1 0 1  Down, Depressed, Hopeless 0 1 0 1  PHQ - 2 Score 0 2 0 2  Altered sleeping - 2 - 1  Tired, decreased energy - 3 - 1  Change in appetite - 3 - 0  Feeling bad or failure about  yourself  - 3 - 0  Trouble concentrating - 0 - 0  Moving slowly or fidgety/restless - 0 - 0  Suicidal thoughts - 0 - 0  PHQ-9 Score - 13 - 4  Difficult doing work/chores - - - Not difficult at all   ALLERGIES: Allergies  Allergen Reactions  . Codeine Shortness Of Breath  . Hydrocodone Shortness Of Breath    Mild SOB    MEDICATIONS: Current Outpatient Medications on File Prior to Visit  Medication Sig Dispense Refill  . ACCU-CHEK FASTCLIX LANCETS MISC 1 Bottle by Does not apply route every morning. 100 each 0  . atorvastatin (LIPITOR) 10 MG tablet Take 1 tablet (10 mg total) by mouth daily. 30 tablet 0  . glucose blood (ACCU-CHEK GUIDE) test strip Use as instructed 100 each 0  . levonorgestrel (MIRENA) 20 MCG/24HR IUD 1 each by Intrauterine route once.    . Melatonin 10 MG TABS Take 1 tablet by mouth at bedtime. 30 tablet 0  . NOVOFINE 32G X 6 MM MISC Inject 1 Stick as directed daily. 90 each 5  . TRUE METRIX BLOOD GLUCOSE TEST test strip 1 each by Other route daily. 1 stick daily 100 each 0  . TRUEPLUS LANCETS 30G MISC Inject 1 Stick as directed daily. 100 each 0   No current facility-administered medications on file prior to visit.     PAST MEDICAL HISTORY: Past Medical History:  Diagnosis Date  . Anxiety   . Back pain   . Chest pain   . Depression   . Diabetes mellitus   . Diarrhea   . Edema    feet and legs  . Gallbladder problem   . 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 GERD (gastroesophageal reflux disease)   . Heartburn   . HTN (hypertension)   . Hyperlipidemia   . Obesity   . Palpitations   . PMDD (premenstrual dysphoric disorder) 01/02/2016  . Shortness of breath   . Vitamin D deficiency   . Vitamin D deficiency     PAST SURGICAL HISTORY: Past Surgical History:  Procedure Laterality Date  . APPENDECTOMY    . CHOLECYSTECTOMY    . EUS N/A 03/23/2014   Procedure: ESOPHAGEAL ENDOSCOPIC ULTRASOUND (EUS) RADIAL;  Surgeon: Arta Silence, MD;  Location: [facility];  Service: Endoscopy;  Laterality: N/A;  . FINE NEEDLE ASPIRATION N/A 03/23/2014   Procedure: FINE NEEDLE ASPIRATION (FNA) LINEAR;  Surgeon: Arta Silence, MD;  Location: [facility];  Service: Endoscopy;  Laterality: N/A;  . TONSILLECTOMY      SOCIAL HISTORY: Social History   Tobacco Use  . Smoking status: Never Smoker  . Smokeless tobacco: Never Used  Substance Use Topics  . Alcohol use: No  . Drug use: No    FAMILY HISTORY: Family History  Problem Relation Age of Onset  . Healthy Mother   . Hypertension Mother   . Hyperlipidemia Mother   . Alcoholism Mother   . Anxiety disorder Mother   . Drug abuse Mother   . Healthy Father   . Colon cancer Paternal Grandfather 41       colon    ROS: Review of Systems  Constitutional: Positive for weight loss.  Gastrointestinal: Negative for nausea and vomiting.  Musculoskeletal:       Negative muscle weakness  Psychiatric/Behavioral: Positive for depression. Negative for suicidal ideas.    PHYSICAL EXAM: Blood pressure 115/70, pulse 75, temperature 98.4 F (36.9 C), temperature source Oral, height 5\' 5"  (1.651 m), weight 205 lb (93 kg), SpO2 98 %. Body mass index is 34.11 kg/m. Physical Exam  Constitutional: She is oriented to person, place, and time. She appears well-developed and well-nourished.  Cardiovascular: Normal rate.  Pulmonary/Chest: Effort normal.  Musculoskeletal: Normal range of motion.  Neurological: She is oriented to person, place, and time.  Skin: Skin is warm and dry.  Psychiatric: She has a normal mood and affect. Her behavior is normal.  Vitals reviewed.   RECENT LABS AND TESTS: BMET    Component Value Date/Time   NA 141 09/25/2016 0811   K 3.9 09/25/2016 0811   CL 100 09/25/2016 0811   CO2 22 09/25/2016 0811   GLUCOSE 93 09/25/2016 0811   GLUCOSE 99 09/26/2014 2012   BUN 11 09/25/2016 0811   CREATININE 0.76 09/25/2016 0811   CREATININE 0.65 05/05/2014  0902   CALCIUM 9.6 09/25/2016 0811   GFRNONAA 96 09/25/2016 0811   GFRNONAA >89 05/05/2014 0902   GFRAA 110 09/25/2016 0811   GFRAA >89 05/05/2014 0902   Lab Results  Component Value Date   HGBA1C 4.7 (L) 01/20/2017   HGBA1C 4.9 09/25/2016   HGBA1C 5.5 04/18/2016   HGBA1C 6.3 (H) 01/02/2016   HGBA1C WILL FOLLOW 01/02/2016   Lab Results  Component Value Date   INSULIN 11.0 01/20/2017   INSULIN 11.7 09/25/2016   INSULIN 21.8 04/18/2016   INSULIN 15.3 01/02/2016  INSULIN WILL FOLLOW 01/02/2016   CBC    Component Value Date/Time   WBC 9.2 04/18/2016 1201   WBC 10.7 (H) 09/26/2014 2012   RBC 4.50 04/18/2016 1201   RBC 4.53 09/26/2014 2012   HGB 13.7 04/18/2016 1201   HCT 40.8 04/18/2016 1201   PLT 392 (H) 04/18/2016 1201   MCV 91 04/18/2016 1201   MCH 30.4 04/18/2016 1201   MCH 30.0 09/26/2014 2012   MCHC 33.6 04/18/2016 1201   MCHC 34.0 09/26/2014 2012   RDW 12.8 04/18/2016 1201   LYMPHSABS 2.4 04/18/2016 1201   MONOABS 0.6 05/05/2014 0902   EOSABS 0.1 04/18/2016 1201   BASOSABS 0.0 04/18/2016 1201   Iron/TIBC/Ferritin/ %Sat No results found for: IRON, TIBC, FERRITIN, IRONPCTSAT Lipid Panel     Component Value Date/Time   CHOL 143 01/20/2017 0924   TRIG 68 01/20/2017 0924   HDL 37 (L) 01/20/2017 0924   CHOLHDL 3.4 05/05/2014 0902   VLDL 16 05/05/2014 0902   LDLCALC 92 01/20/2017 0924   Hepatic Function Panel     Component Value Date/Time   PROT 7.5 09/25/2016 0811   ALBUMIN 4.5 09/25/2016 0811   AST 16 09/25/2016 0811   ALT 17 09/25/2016 0811   ALKPHOS 105 09/25/2016 0811   BILITOT 0.7 09/25/2016 0811      Component Value Date/Time   TSH 3.260 09/25/2016 0811   TSH 2.090 04/18/2016 1201   TSH 2.940 01/02/2016 1503  Results for HENNY, STRAUCH (MRN [id number]) as of 01/22/2017 11:45  Ref. Range 01/20/2017 09:24  Vitamin D, 25-Hydroxy Latest Ref Range: 30.0 - 100.0 ng/mL 52.3    ASSESSMENT AND PLAN: Vitamin D deficiency - Plan: Vitamin D,  Ergocalciferol, (DRISDOL) 50000 units CAPS capsule  Other depression - with emotional eating - Plan: buPROPion (WELLBUTRIN SR) 200 MG 12 hr tablet  Class 1 obesity with serious comorbidity and body mass index (BMI) of 34.0 to 34.9 in adult, unspecified obesity type - Plan: Liraglutide -Weight Management (SAXENDA) 18 MG/3ML SOPN  PLAN:  Vitamin D Deficiency Jasmine Martinez was informed that low vitamin D levels contributes to fatigue and are associated with obesity, breast, and colon cancer. Jasmine Martinez agrees to continue taking prescription Vit D @50 ,000 IU every week #10 and we will refill for 1 month. She will follow up for routine testing of vitamin D, at least 2-3 times per year. She was informed of the risk of over-replacement of vitamin D and agrees to not increase her dose unless he discusses this with [place] first. Jasmine Martinez agrees to follow up with our clinic in 2 weeks.  Depression with Emotional Eating Behaviors We discussed behavior modification techniques today to help Jasmine Martinez deal with her emotional eating and depression. Jasmine Martinez agrees to continue taking Wellbutrin SR 200 mg qd #30 and we will refill for 1 month. Jasmine Martinez agrees to follow up with our clinic in 2 weeks.  Obesity Jasmine Martinez is currently in the action stage of change. As such, her goal is to continue with weight loss efforts She has agreed to follow a lower carbohydrate, vegetable and lean protein rich diet plan Jasmine Martinez has been instructed to work up to a goal of 150 minutes of combined cardio and strengthening exercise per week for weight loss and overall health benefits. We discussed the following Behavioral Modification Strategies today: work on meal planning and easy cooking plans, dealing with family or coworker sabotage, and no skipping meals We discussed various medication options to help Jasmine Martinez with her weight loss efforts and we  both agreed to continue Saxenda 3 mg qd and we will refill for 1 month.  Jasmine Martinez has  agreed to follow up with our clinic in 2 weeks. She was informed of the importance of frequent follow up visits to maximize her success with intensive lifestyle modifications for her multiple health conditions.  Wilhemena Durie, am acting as transcriptionist for Dennard Nip, MD

## 2017-01-23 ENCOUNTER — Other Ambulatory Visit: Payer: Self-pay | Admitting: Certified Nurse Midwife

## 2017-01-23 ENCOUNTER — Encounter: Payer: Self-pay | Admitting: Internal Medicine

## 2017-01-23 ENCOUNTER — Ambulatory Visit (INDEPENDENT_AMBULATORY_CARE_PROVIDER_SITE_OTHER): Payer: 59 | Admitting: Internal Medicine

## 2017-01-23 VITALS — BP 116/72 | HR 70 | Temp 98.6°F | Wt 214.0 lb

## 2017-01-23 DIAGNOSIS — E119 Type 2 diabetes mellitus without complications: Secondary | ICD-10-CM | POA: Diagnosis not present

## 2017-01-23 DIAGNOSIS — K580 Irritable bowel syndrome with diarrhea: Secondary | ICD-10-CM

## 2017-01-23 DIAGNOSIS — Z7189 Other specified counseling: Secondary | ICD-10-CM

## 2017-01-23 DIAGNOSIS — Z7184 Encounter for health counseling related to travel: Secondary | ICD-10-CM

## 2017-01-23 MED ORDER — AZITHROMYCIN 250 MG PO TABS: ORAL_TABLET | ORAL | 0 refills | Status: DC

## 2017-01-23 MED ORDER — ALPRAZOLAM 0.5 MG PO TABS: 0.5000 mg | ORAL_TABLET | Freq: Two times a day (BID) | ORAL | 0 refills | Status: DC | PRN

## 2017-01-23 MED ORDER — RIFAXIMIN 200 MG PO TABS: 200.0000 mg | ORAL_TABLET | Freq: Three times a day (TID) | ORAL | 0 refills | Status: DC

## 2017-01-23 MED ORDER — RIFAXIMIN 550 MG PO TABS: 550.0000 mg | ORAL_TABLET | Freq: Three times a day (TID) | ORAL | 0 refills | Status: DC

## 2017-01-23 MED ORDER — ALPRAZOLAM 0.5 MG PO TBDP: 0.5000 mg | ORAL_TABLET | Freq: Two times a day (BID) | ORAL | 0 refills | Status: DC | PRN

## 2017-01-23 MED FILL — AZITHROMYCIN 250 MG TABLET: 250 | 5 days supply | Qty: 6 | Fill #0

## 2017-01-24 ENCOUNTER — Other Ambulatory Visit: Payer: Self-pay | Admitting: Certified Nurse Midwife

## 2017-01-24 DIAGNOSIS — K580 Irritable bowel syndrome with diarrhea: Secondary | ICD-10-CM

## 2017-01-24 MED ORDER — DICYCLOMINE HCL 20 MG PO TABS: 20.0000 mg | ORAL_TABLET | Freq: Three times a day (TID) | ORAL | 0 refills | Status: DC

## 2017-01-24 MED ORDER — DIPHENOXYLATE-ATROPINE 2.5-0.025 MG PO TABS: 2.0000 | ORAL_TABLET | Freq: Four times a day (QID) | ORAL | 0 refills | Status: DC | PRN

## 2017-01-24 MED FILL — DICYCLOMINE 20 MG TABLET: 20 | 7 days supply | Qty: 30 | Fill #0

## 2017-01-24 NOTE — Progress Notes (Signed)
g

## 2017-01-27 MED FILL — ALPRAZolam 0.5 MG TABS: 0.5 | 15 days supply | Qty: 30 | Fill #0

## 2017-01-27 MED FILL — XIFAXAN 200 MG TABLET: 200 | 3 days supply | Qty: 9 | Fill #0

## 2017-01-27 MED FILL — DIPHENOXYLATE/ATROPINE TAB: 2.5-0.025 | 8 days supply | Qty: 30 | Fill #0

## 2017-01-28 DIAGNOSIS — Z124 Encounter for screening for malignant neoplasm of cervix: Secondary | ICD-10-CM | POA: Diagnosis not present

## 2017-01-28 DIAGNOSIS — Z1231 Encounter for screening mammogram for malignant neoplasm of breast: Secondary | ICD-10-CM | POA: Diagnosis not present

## 2017-01-28 DIAGNOSIS — Z01419 Encounter for gynecological examination (general) (routine) without abnormal findings: Secondary | ICD-10-CM | POA: Diagnosis not present

## 2017-01-28 DIAGNOSIS — R87610 Atypical squamous cells of undetermined significance on cytologic smear of cervix (ASC-US): Secondary | ICD-10-CM | POA: Diagnosis not present

## 2017-01-28 MED FILL — CIPROFLOXACIN HCL 250 MG TA: 250 | 7 days supply | Qty: 14 | Fill #0

## 2017-01-29 DIAGNOSIS — F411 Generalized anxiety disorder: Secondary | ICD-10-CM | POA: Diagnosis not present

## 2017-01-31 ENCOUNTER — Telehealth: Payer: 59 | Admitting: Family

## 2017-01-31 DIAGNOSIS — B373 Candidiasis of vulva and vagina: Secondary | ICD-10-CM | POA: Diagnosis not present

## 2017-01-31 DIAGNOSIS — B3731 Acute candidiasis of vulva and vagina: Secondary | ICD-10-CM

## 2017-01-31 MED ORDER — FLUCONAZOLE 150 MG PO TABS: 150.0000 mg | ORAL_TABLET | ORAL | 0 refills | Status: DC | PRN

## 2017-01-31 MED FILL — FLUCONAZOLE 150 MG TABLET: 150 | 3 days supply | Qty: 3 | Fill #0

## 2017-01-31 NOTE — Progress Notes (Signed)

## 2017-02-04 ENCOUNTER — Other Ambulatory Visit: Payer: Self-pay | Admitting: Internal Medicine

## 2017-02-04 MED FILL — NOVOFINE 32G NEEDLES: 32G X 6 MM | 90 days supply | Qty: 100 | Fill #0

## 2017-02-05 DIAGNOSIS — F411 Generalized anxiety disorder: Secondary | ICD-10-CM | POA: Diagnosis not present

## 2017-02-06 ENCOUNTER — Ambulatory Visit (INDEPENDENT_AMBULATORY_CARE_PROVIDER_SITE_OTHER): Payer: 59 | Admitting: Family Medicine

## 2017-02-06 VITALS — BP 105/64 | HR 79 | Temp 98.1°F | Ht 65.0 in | Wt 203.0 lb

## 2017-02-06 DIAGNOSIS — Z6833 Body mass index (BMI) 33.0-33.9, adult: Secondary | ICD-10-CM | POA: Diagnosis not present

## 2017-02-06 DIAGNOSIS — E119 Type 2 diabetes mellitus without complications: Secondary | ICD-10-CM | POA: Diagnosis not present

## 2017-02-06 DIAGNOSIS — Z9189 Other specified personal risk factors, not elsewhere classified: Secondary | ICD-10-CM

## 2017-02-06 DIAGNOSIS — E669 Obesity, unspecified: Secondary | ICD-10-CM | POA: Diagnosis not present

## 2017-02-06 DIAGNOSIS — E559 Vitamin D deficiency, unspecified: Secondary | ICD-10-CM | POA: Diagnosis not present

## 2017-02-06 MED ORDER — LIRAGLUTIDE -WEIGHT MANAGEMENT 18 MG/3ML ~~LOC~~ SOPN: 3.0000 mg | PEN_INJECTOR | Freq: Every day | SUBCUTANEOUS | 0 refills | Status: DC

## 2017-02-06 MED ORDER — ACCU-CHEK FASTCLIX LANCETS MISC: 1.0000 | Freq: Every morning | 0 refills | Status: DC

## 2017-02-06 MED FILL — ACCU-CHEK FASTCLIX LANCETS: 90 days supply | Qty: 102 | Fill #0

## 2017-02-06 MED FILL — SAXENDA 18 MG/3 ML PEN: 18 | 30 days supply | Qty: 15 | Fill #0

## 2017-02-06 NOTE — Progress Notes (Signed)
Office: [phone]  /  Fax: [phone]   HPI:   Chief Complaint: OBESITY Jasmine Martinez is here to discuss her progress with her obesity treatment plan. She is on the lower carbohydrate, vegetable and lean protein rich diet plan and is following her eating plan approximately 100 % of the time. She states she is exercising 0 minutes 0 times per week. Jasmine Martinez is really liking the low carbohydrate plan. She is occasionally indulging when she wants. Her last Hgb A1c was at 4.5. Jasmine Martinez walks a lot at work. She is leaving for vacation in 2 days and plans on walking throughout her vacation. Her weight is 203 lb (92.1 kg) today and has had a weight loss of 2 pounds over a period of 2 weeks since her last visit. She has lost 57 lbs since starting treatment with [place].  Diabetes II [name] has a diagnosis of diabetes type II. Her last A1c was at 4.5 and hunger is controlled. Royal states fasting BGs range between 70 and 80's, as well as post prandial and she denies any hypoglycemic episodes. She has been working on intensive lifestyle modifications including diet, exercise, and weight loss to help control her blood glucose levels.  Vitamin D deficiency Jasmine Martinez has a diagnosis of vitamin D deficiency. She is currently taking vit D and denies nausea, vomiting or muscle weakness.   Ref. Range 01/20/2017 09:24  Vitamin D, 25-Hydroxy Latest Ref Range: 30.0 - 100.0 ng/mL 52.3   At risk for osteopenia and osteoporosis Jasmine Martinez is at higher risk of osteopenia and osteoporosis due to vitamin D deficiency.   ALLERGIES: Allergies  Allergen Reactions  . Codeine Shortness Of Breath  . Hydrocodone Shortness Of Breath    Mild SOB    MEDICATIONS: Current Outpatient Medications on File Prior to Visit  Medication Sig Dispense Refill  . ACCU-CHEK FASTCLIX LANCETS MISC 1 Bottle by Does not apply route every morning. 100 each 0  . ALPRAZolam (NIRAVAM) 0.5 MG dissolvable tablet Take 1 tablet (0.5 mg total) by  mouth 2 (two) times daily as needed for anxiety. 30 tablet 0  . ALPRAZolam (XANAX) 0.5 MG tablet Take 1 tablet (0.5 mg total) by mouth 2 (two) times daily as needed for anxiety. 30 tablet 0  . atorvastatin (LIPITOR) 10 MG tablet Take 1 tablet (10 mg total) by mouth daily. 30 tablet 0  . azithromycin (ZITHROMAX) 250 MG tablet 2 po day 1 followed  By one po day 2-5 6 tablet 0  . buPROPion (WELLBUTRIN SR) 200 MG 12 hr tablet Take 1 tablet (200 mg total) by mouth daily. 30 tablet 0  . dicyclomine (BENTYL) 20 MG tablet Take 1 tablet (20 mg total) by mouth 4 (four) times daily -  before meals and at bedtime. 30 tablet 0  . diphenoxylate-atropine (LOMOTIL) 2.5-0.025 MG tablet Take 2 tablets by mouth 4 (four) times daily as needed for diarrhea or loose stools. 30 tablet 0  . fluconazole (DIFLUCAN) 150 MG tablet Take 1 tablet (150 mg total) by mouth every three (3) days as needed. 3 tablet 0  . glucose blood (ACCU-CHEK GUIDE) test strip Use as instructed 100 each 0  . levonorgestrel (MIRENA) 20 MCG/24HR IUD 1 each by Intrauterine route once.    . Liraglutide -Weight Management (SAXENDA) 18 MG/3ML SOPN Inject 3 mg into the skin daily. 5 pen 0  . Melatonin 10 MG TABS Take 1 tablet by mouth at bedtime. 30 tablet 0  . NOVOFINE 32G X 6 MM MISC INJECT AS DIRECTED  DAILY. 90 each 5  . rifaximin (XIFAXAN) 550 MG TABS tablet Take 1 tablet (550 mg total) by mouth 3 (three) times daily. 42 tablet 0  . TRUE METRIX BLOOD GLUCOSE TEST test strip 1 each by Other route daily. 1 stick daily 100 each 0  . TRUEPLUS LANCETS 30G MISC Inject 1 Stick as directed daily. 100 each 0  . Vitamin D, Ergocalciferol, (DRISDOL) 50000 units CAPS capsule Take 1 capsule (50,000 Units total) by mouth every 3 (three) days. 10 capsule 0   No current facility-administered medications on file prior to visit.     PAST MEDICAL HISTORY: Past Medical History:  Diagnosis Date  . Anxiety   . Back pain   . Chest pain   . Depression   . Diabetes  mellitus   . Diarrhea   . Edema    feet and legs  . Gallbladder problem   . 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 GERD (gastroesophageal reflux disease)   . Heartburn   . HTN (hypertension)   . Hyperlipidemia   . Obesity   . Palpitations   . PMDD (premenstrual dysphoric disorder) 01/02/2016  . Shortness of breath   . Vitamin D deficiency   . Vitamin D deficiency     PAST SURGICAL HISTORY: Past Surgical History:  Procedure Laterality Date  . APPENDECTOMY    . CHOLECYSTECTOMY    . EUS N/A 03/23/2014   Procedure: ESOPHAGEAL ENDOSCOPIC ULTRASOUND (EUS) RADIAL;  Surgeon: Arta Silence, MD;  Location: [facility];  Service: Endoscopy;  Laterality: N/A;  . FINE NEEDLE ASPIRATION N/A 03/23/2014   Procedure: FINE NEEDLE ASPIRATION (FNA) LINEAR;  Surgeon: Arta Silence, MD;  Location: [facility];  Service: Endoscopy;  Laterality: N/A;  . TONSILLECTOMY      SOCIAL HISTORY: Social History   Tobacco Use  . Smoking status: Never Smoker  . Smokeless tobacco: Never Used  Substance Use Topics  . Alcohol use: No  . Drug use: No    FAMILY HISTORY: Family History  Problem Relation Age of Onset  . Healthy Mother   . Hypertension Mother   . Hyperlipidemia Mother   . Alcoholism Mother   . Anxiety disorder Mother   . Drug abuse Mother   . Healthy Father   . Colon cancer Paternal Grandfather 45       colon    ROS: Review of Systems  Constitutional: Positive for malaise/fatigue and weight loss.  Gastrointestinal: Negative for nausea and vomiting.  Musculoskeletal:       Negative muscle weakness  Endo/Heme/Allergies:       Negative hypoglycemia    PHYSICAL EXAM: Blood pressure 105/64, pulse 79, temperature 98.1 F (36.7 C), temperature source Oral, height 5\' 5"  (1.651 m), weight 203 lb (92.1 kg), SpO2 99 %. Body mass index is 33.78 kg/m. Physical Exam  Constitutional: She is oriented to person, place, and time. She appears well-developed and  well-nourished.  Cardiovascular: Normal rate.  Pulmonary/Chest: Effort normal.  Musculoskeletal: Normal range of motion.  Neurological: She is oriented to person, place, and time.  Skin: Skin is warm and dry.  Psychiatric: She has a normal mood and affect. Her behavior is normal.  Vitals reviewed.   RECENT LABS AND TESTS: BMET    Component Value Date/Time   NA 141 09/25/2016 0811   K 3.9 09/25/2016 0811   CL 100 09/25/2016 0811   CO2 22 09/25/2016 0811   GLUCOSE 93 09/25/2016 0811   GLUCOSE 99 09/26/2014 2012  BUN 11 09/25/2016 0811   CREATININE 0.76 09/25/2016 0811   CREATININE 0.65 05/05/2014 0902   CALCIUM 9.6 09/25/2016 0811   GFRNONAA 96 09/25/2016 0811   GFRNONAA >89 05/05/2014 0902   GFRAA 110 09/25/2016 0811   GFRAA >89 05/05/2014 0902   Lab Results  Component Value Date   HGBA1C 4.7 (L) 01/20/2017   HGBA1C 4.9 09/25/2016   HGBA1C 5.5 04/18/2016   HGBA1C 6.3 (H) 01/02/2016   HGBA1C WILL FOLLOW 01/02/2016   Lab Results  Component Value Date   INSULIN 11.0 01/20/2017   INSULIN 11.7 09/25/2016   INSULIN 21.8 04/18/2016   INSULIN 15.3 01/02/2016   INSULIN WILL FOLLOW 01/02/2016   CBC    Component Value Date/Time   WBC 9.2 04/18/2016 1201   WBC 10.7 (H) 09/26/2014 2012   RBC 4.50 04/18/2016 1201   RBC 4.53 09/26/2014 2012   HGB 13.7 04/18/2016 1201   HCT 40.8 04/18/2016 1201   PLT 392 (H) 04/18/2016 1201   MCV 91 04/18/2016 1201   MCH 30.4 04/18/2016 1201   MCH 30.0 09/26/2014 2012   MCHC 33.6 04/18/2016 1201   MCHC 34.0 09/26/2014 2012   RDW 12.8 04/18/2016 1201   LYMPHSABS 2.4 04/18/2016 1201   MONOABS 0.6 05/05/2014 0902   EOSABS 0.1 04/18/2016 1201   BASOSABS 0.0 04/18/2016 1201   Iron/TIBC/Ferritin/ %Sat No results found for: IRON, TIBC, FERRITIN, IRONPCTSAT Lipid Panel     Component Value Date/Time   CHOL 143 01/20/2017 0924   TRIG 68 01/20/2017 0924   HDL 37 (L) 01/20/2017 0924   CHOLHDL 3.4 05/05/2014 0902   VLDL 16 05/05/2014  0902   LDLCALC 92 01/20/2017 0924   Hepatic Function Panel     Component Value Date/Time   PROT 7.5 09/25/2016 0811   ALBUMIN 4.5 09/25/2016 0811   AST 16 09/25/2016 0811   ALT 17 09/25/2016 0811   ALKPHOS 105 09/25/2016 0811   BILITOT 0.7 09/25/2016 0811      Component Value Date/Time   TSH 3.260 09/25/2016 0811   TSH 2.090 04/18/2016 1201   TSH 2.940 01/02/2016 1503    ASSESSMENT AND PLAN: Type 2 diabetes mellitus without complication, without long-term current use of insulin ([place]) - Plan: ACCU-CHEK FASTCLIX LANCETS MISC  Vitamin D deficiency  At risk for osteoporosis  Class 1 obesity with serious comorbidity and body mass index (BMI) of 33.0 to 33.9 in adult, unspecified obesity type - Plan: Liraglutide -Weight Management ([place]) 18 MG/3ML SOPN  PLAN:  Diabetes II Hina has been given extensive diabetes education by myself today including ideal fasting and post-prandial blood glucose readings, individual ideal Hgb A1c goals and hypoglycemia prevention. We discussed the importance of good blood sugar control to decrease the likelihood of diabetic complications such as nephropathy, neuropathy, limb loss, blindness, coronary artery disease, and death. We discussed the importance of intensive lifestyle modification including diet, exercise and weight loss as the first line treatment for diabetes. Tenia agrees to continue Saxenda 3.0 mg qd, we will refill # 5 pens and lancets. Bilan agrees to follow up with our clinic in 3 weeks.  Vitamin D Deficiency Keiry was informed that low vitamin D levels contributes to fatigue and are associated with obesity, breast, and colon cancer. She agrees to continue to take prescription Vit D @50 ,000 IU every week and will follow up for routine testing of vitamin D, at least 2-3 times per year. She was informed of the risk of over-replacement of vitamin D and agrees to  not increase her dose unless she discusses this with [place] first.  At  risk for osteopenia and osteoporosis Nerea is at risk for osteopenia and osteoporosis due to her vitamin D deficiency. She was encouraged to take her vitamin D and follow her higher calcium diet and increase strengthening exercise to help strengthen her bones and decrease her risk of osteopenia and osteoporosis.  Obesity Jasmine Martinez is currently in the action stage of change. As such, her goal is to continue with weight loss efforts She has agreed to follow a lower carbohydrate, vegetable and lean protein rich diet plan Jasmine Martinez has been instructed to work up to a goal of 150 minutes of combined cardio and strengthening exercise per week or starting strength training 2 times per week for weight loss and overall health benefits. We discussed the following Behavioral Modification Strategies today: increase H2O intake, travel eating strategies and celebration eating strategies.   Jasmine Martinez has agreed to follow up with our clinic in 3 weeks. She was informed of the importance of frequent follow up visits to maximize her success with intensive lifestyle modifications for her multiple health conditions.   I, Doreene Nest, am acting as transcriptionist for Dennard Nip, MD  I have reviewed the above documentation for accuracy and completeness, and I agree with the above. -Dennard Nip, MD

## 2017-02-11 DIAGNOSIS — Z7901 Long term (current) use of anticoagulants: Secondary | ICD-10-CM | POA: Diagnosis not present

## 2017-02-11 DIAGNOSIS — E119 Type 2 diabetes mellitus without complications: Secondary | ICD-10-CM | POA: Diagnosis not present

## 2017-02-11 DIAGNOSIS — I252 Old myocardial infarction: Secondary | ICD-10-CM | POA: Diagnosis not present

## 2017-02-11 DIAGNOSIS — Z955 Presence of coronary angioplasty implant and graft: Secondary | ICD-10-CM | POA: Diagnosis not present

## 2017-02-11 DIAGNOSIS — I10 Essential (primary) hypertension: Secondary | ICD-10-CM | POA: Diagnosis not present

## 2017-02-11 DIAGNOSIS — Z7982 Long term (current) use of aspirin: Secondary | ICD-10-CM | POA: Diagnosis not present

## 2017-02-11 DIAGNOSIS — Z794 Long term (current) use of insulin: Secondary | ICD-10-CM | POA: Diagnosis not present

## 2017-02-11 DIAGNOSIS — I2510 Atherosclerotic heart disease of native coronary artery without angina pectoris: Secondary | ICD-10-CM | POA: Diagnosis not present

## 2017-02-13 ENCOUNTER — Encounter: Payer: Self-pay | Admitting: Internal Medicine

## 2017-02-13 NOTE — Progress Notes (Signed)
   Subjective:    Patient ID: Jasmine Martinez, female    DOB: [birth year], 47 y.o.   MRN: [id number]  HPI Patient has a history of irritable bowel syndrome and is going to [place] in the near future.  She is concerned about having an accident if she is traveling by plane for a long period of time.  She would like to try rifaximin.  She has a history of diabetes and is on liraglutide 3 mg daily.  She should also consider taking an antibiotic in case she gets sick in [place].  Was given prescription for Xanax to take to help her sleep on the plane.    Review of Systems see above     Objective:   Physical Exam Abdomen is unremarkable.  Spent 15 minutes speaking with her about how to manage travel medications       Assessment & Plan:  Irritable bowel syndrome with diarrhea  Travel advice encounter  Plan: Rifaximin prescribed along with Zithromax Z-PAK and Xanax

## 2017-02-13 NOTE — Patient Instructions (Signed)
Xanax prescribed to help sleep complaining.  Rifaximin prescribed for irritable bowel syndrome with diarrhea on trip.  Zithromax Z-PAK prescribed in case she develops respiratory infection while on trip

## 2017-02-18 ENCOUNTER — Encounter: Payer: Self-pay | Admitting: Internal Medicine

## 2017-02-18 ENCOUNTER — Ambulatory Visit: Payer: 59 | Admitting: Internal Medicine

## 2017-02-18 VITALS — BP 120/70 | Ht 65.0 in

## 2017-02-18 DIAGNOSIS — I889 Nonspecific lymphadenitis, unspecified: Secondary | ICD-10-CM

## 2017-02-18 DIAGNOSIS — F411 Generalized anxiety disorder: Secondary | ICD-10-CM | POA: Diagnosis not present

## 2017-02-18 MED ORDER — DOXYCYCLINE HYCLATE 100 MG PO TABS: 100.0000 mg | ORAL_TABLET | Freq: Two times a day (BID) | ORAL | 0 refills | Status: DC

## 2017-02-18 MED FILL — DOXYCYCLINE HYCLATE 100 MG: 100 | 10 days supply | Qty: 20 | Fill #0

## 2017-02-25 DIAGNOSIS — F411 Generalized anxiety disorder: Secondary | ICD-10-CM | POA: Diagnosis not present

## 2017-02-27 ENCOUNTER — Ambulatory Visit: Payer: 59 | Admitting: Internal Medicine

## 2017-02-27 ENCOUNTER — Encounter (INDEPENDENT_AMBULATORY_CARE_PROVIDER_SITE_OTHER): Payer: Self-pay

## 2017-02-27 ENCOUNTER — Ambulatory Visit (INDEPENDENT_AMBULATORY_CARE_PROVIDER_SITE_OTHER): Payer: 59 | Admitting: Family Medicine

## 2017-02-27 ENCOUNTER — Encounter: Payer: Self-pay | Admitting: Internal Medicine

## 2017-02-27 VITALS — BP 130/80 | HR 78 | Temp 98.2°F | Wt 208.0 lb

## 2017-02-27 DIAGNOSIS — R599 Enlarged lymph nodes, unspecified: Secondary | ICD-10-CM

## 2017-02-27 DIAGNOSIS — Z8509 Personal history of malignant neoplasm of other digestive organs: Secondary | ICD-10-CM

## 2017-02-27 DIAGNOSIS — E119 Type 2 diabetes mellitus without complications: Secondary | ICD-10-CM | POA: Diagnosis not present

## 2017-02-27 DIAGNOSIS — I889 Nonspecific lymphadenitis, unspecified: Secondary | ICD-10-CM

## 2017-02-27 DIAGNOSIS — R221 Localized swelling, mass and lump, neck: Secondary | ICD-10-CM | POA: Diagnosis not present

## 2017-02-28 LAB — CBC WITH DIFFERENTIAL/PLATELET
BASOS ABS: 20 {cells}/uL (ref 0–200)
Basophils Relative: 0.3 %
EOS PCT: 1 %
Eosinophils Absolute: 68 cells/uL (ref 15–500)
HEMATOCRIT: 36.5 % (ref 35.0–45.0)
Hemoglobin: 12.7 g/dL (ref 11.7–15.5)
Lymphs Abs: 1904 cells/uL (ref 850–3900)
MCH: 31.1 pg (ref 27.0–33.0)
MCHC: 34.8 g/dL (ref 32.0–36.0)
MCV: 89.5 fL (ref 80.0–100.0)
MPV: 11 fL (ref 7.5–12.5)
Monocytes Relative: 7.3 %
NEUTROS PCT: 63.4 %
Neutro Abs: 4311 cells/uL (ref 1500–7800)
PLATELETS: 286 10*3/uL (ref 140–400)
RBC: 4.08 10*6/uL (ref 3.80–5.10)
RDW: 12.2 % (ref 11.0–15.0)
TOTAL LYMPHOCYTE: 28 %
WBC mixed population: 496 cells/uL (ref 200–950)
WBC: 6.8 10*3/uL (ref 3.8–10.8)

## 2017-02-28 LAB — COMPLETE METABOLIC PANEL WITH GFR
AG RATIO: 1.3 (calc) (ref 1.0–2.5)
ALKALINE PHOSPHATASE (APISO): 87 U/L (ref 33–115)
ALT: 20 U/L (ref 6–29)
AST: 12 U/L (ref 10–30)
Albumin: 3.9 g/dL (ref 3.6–5.1)
BUN: 10 mg/dL (ref 7–25)
CO2: 24 mmol/L (ref 20–32)
CREATININE: 0.68 mg/dL (ref 0.50–1.10)
Calcium: 9.4 mg/dL (ref 8.6–10.2)
Chloride: 108 mmol/L (ref 98–110)
GFR, Est African American: 123 mL/min/{1.73_m2} (ref >=60)
GFR, Est Non African American: 106 mL/min/{1.73_m2} (ref >=60)
Globulin: 2.9 g/dL (calc) (ref 1.9–3.7)
Glucose, Bld: 89 mg/dL (ref 65–99)
POTASSIUM: 4.3 mmol/L (ref 3.5–5.3)
SODIUM: 143 mmol/L (ref 135–146)
Total Bilirubin: 0.3 mg/dL (ref 0.2–1.2)
Total Protein: 6.8 g/dL (ref 6.1–8.1)

## 2017-02-28 LAB — CYTOMEGALOVIRUS ANTIBODY, IGG: Cytomegalovirus Ab-IgG: <0.6 U/mL

## 2017-02-28 LAB — EPSTEIN-BARR VIRUS VCA ANTIBODY PANEL
EBV VCA IGG: 331 U/mL — AB
EBV VCA IgM: <36 U/mL

## 2017-02-28 LAB — SEDIMENTATION RATE: SED RATE: 36 mm/h — AB (ref 0–20)

## 2017-03-02 ENCOUNTER — Encounter: Payer: Self-pay | Admitting: Internal Medicine

## 2017-03-02 NOTE — Progress Notes (Signed)
   Subjective:    Patient ID: Jasmine Martinez, female    DOB: 08-18-1970, 47 y.o.   MRN: [id number]  HPI Patient recently trip to [place].  Is feeling some slight right ear discomfort but has noticed swelling in her right neck. No documented fever.  She took rifaximin for irritable bowel symptoms while on the trip and did well.  She has a history of diabetes mellitus.  She was given a Z-Pak to take with her on her trip.  She started this once she saw the neck swelling.  Says she had mononucleosis a number of years ago.   Review of Systems has some vague right ear discomfort.  No documented fever or chills.     Objective:   Physical Exam  Skin is warm and dry.  Appears to be a right posterior cervical node with some swelling.  Couple of submental nodes.  Her neck is supple.  Chest is clear to auscultation without rales or wheezing.  Pharynx is very slightly injected without exudate.      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 Mont Dutton.  Last had endoscopic ultrasound in December 2017.  Oncology at [facility] indicated her prognosis was favorable.  History of irritable bowel syndrome that started after cholecystectomy  Plan: Doxycycline 100 mg twice daily for 10 days.  Call if not better in 10 days to 2 weeks or sooner if worse.

## 2017-03-02 NOTE — Patient Instructions (Addendum)
Doxycycline 100 mg twice daily for 10 days.  Call if not better in 10 days to 2 weeks.

## 2017-03-03 DIAGNOSIS — F411 Generalized anxiety disorder: Secondary | ICD-10-CM | POA: Diagnosis not present

## 2017-03-03 NOTE — Progress Notes (Signed)
   Subjective:    Patient ID: Jasmine Martinez, female    DOB: 02-14-1970, 47 y.o.   MRN: [id number]  HPI She was seen on February 5 after returning from trip to [place].  But had noticed swelling in right neck and was treated with doxycycline but has seen no change in swelling of the right upper neck.  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  She is anxious about the swelling in her neck.  We drew CMV and Epstein-Barr titers today but she has a remote history of Epstein-Barr infection.     Review of Systems see above-no change in appetite or weight.  She has a history of diabetes mellitus.     Objective:   Physical Exam She has 2 small submental lymph nodes and I think a right anterior cervical node as well.  There appears to be some not sure what area in her right lateral neck near her ear that is nontender.  I am wondering if this is a cyst versus lymph node.         Assessment & Plan:  Lymphadenitis submental and right anterior cervical area not responding to doxycycline.  Epstein-Barr and CMV titers drawn.  Results showed remote history of Epstein-Barr virus infection.  Negative for CMV.  Plan: I think she should see ENT physician for further evaluation.  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

## 2017-03-03 NOTE — Patient Instructions (Addendum)
To see ENT physician.  Will order CT of neck with contrast.

## 2017-03-06 ENCOUNTER — Ambulatory Visit
Admission: RE | Admit: 2017-03-06 | Discharge: 2017-03-06 | Disposition: A | Discharge status: Home / Self Care | Payer: 59 | Source: Ambulatory Visit | Attending: Internal Medicine | Admitting: Internal Medicine

## 2017-03-06 DIAGNOSIS — R221 Localized swelling, mass and lump, neck: Secondary | ICD-10-CM

## 2017-03-06 DIAGNOSIS — R599 Enlarged lymph nodes, unspecified: Secondary | ICD-10-CM | POA: Diagnosis not present

## 2017-03-06 IMAGING — CT CT NECK W/ CM
1 series · 12 of 14 positions shown, 15 images · IV contrast (iopamidol)
Comparison: None.

CLINICAL DATA: Right-sided cervical lymph node enlargement. History
of GIST.

EXAM:
CT NECK WITH CONTRAST
TECHNIQUE: Multidetector CT imaging of the neck was performed using the
standard protocol following the bolus administration of intravenous
contrast.
CONTRAST:  75mL 3IPJQ0-KTT IOPAMIDOL (3IPJQ0-KTT) INJECTION 61%

[Series 6: (person_name) · coronal · 0.44mm/px · 12 of 101 slices shown, 15 images]
[im 8/101  soft-tissue]
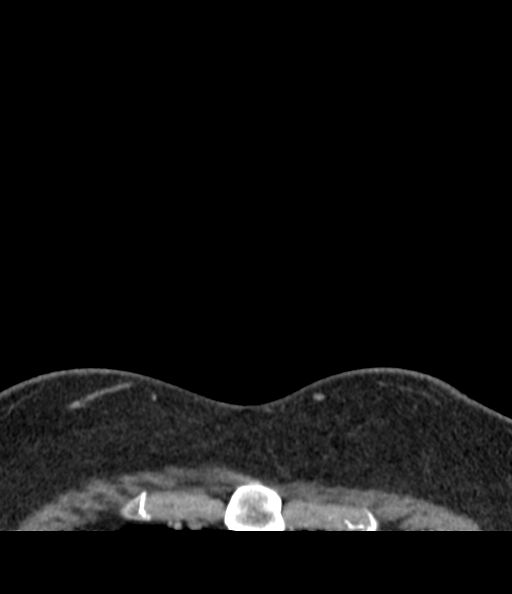
[im 8/101  bone]
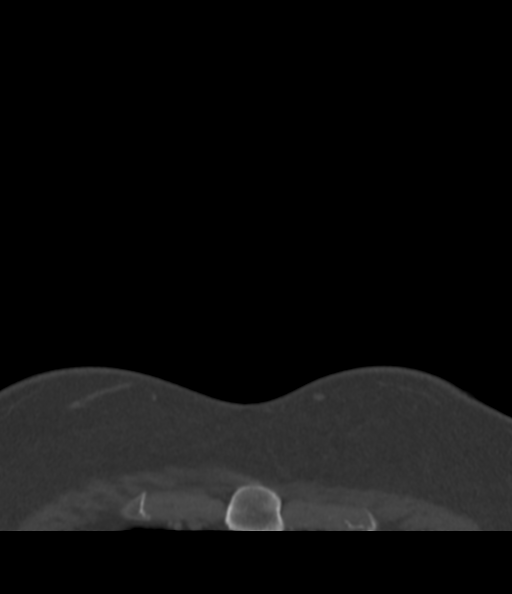
[im 16/101  bone]
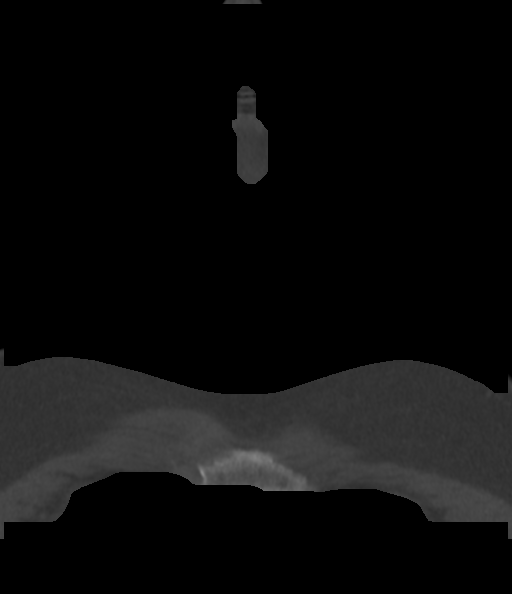
[im 24/101  bone]
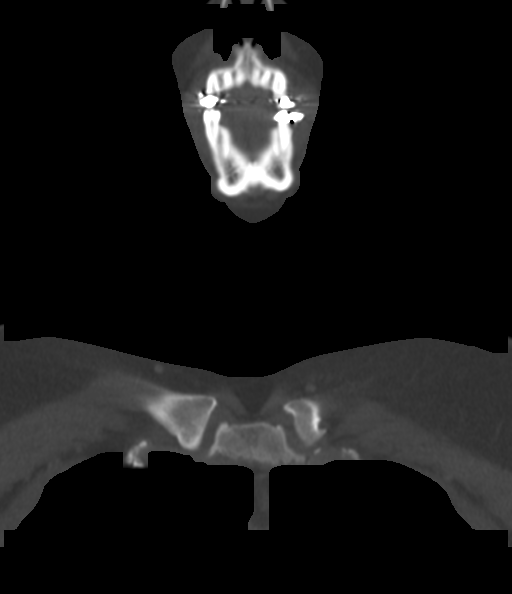
[im 31/101  bone]
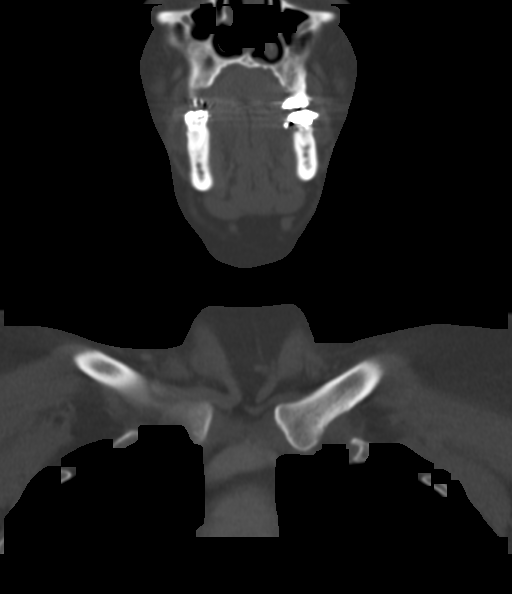
[im 39/101  soft-tissue]
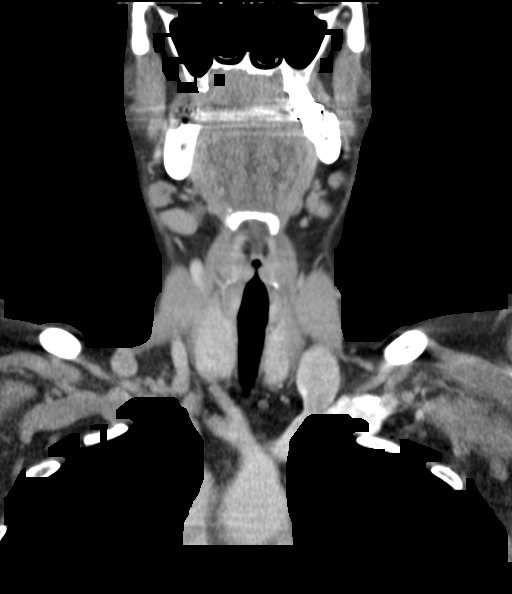
[im 39/101  bone]
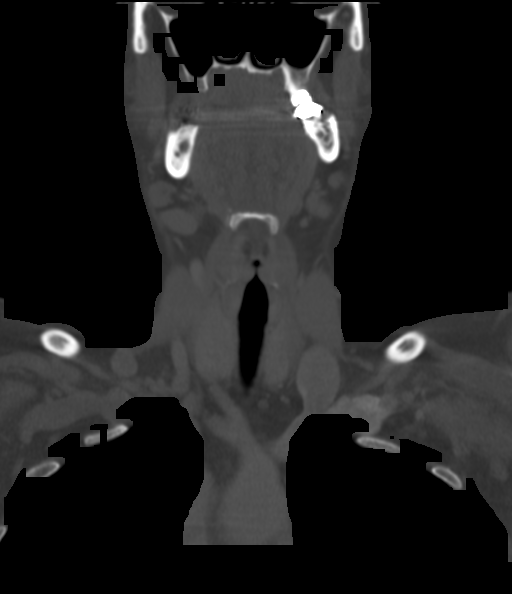
[im 47/101  bone]
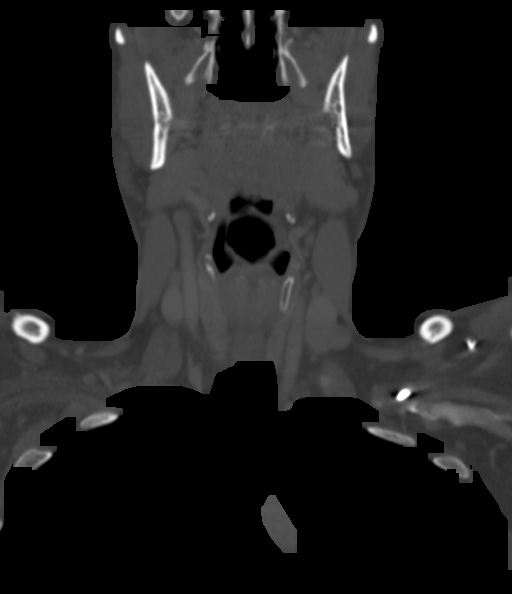
[im 54/101  bone]
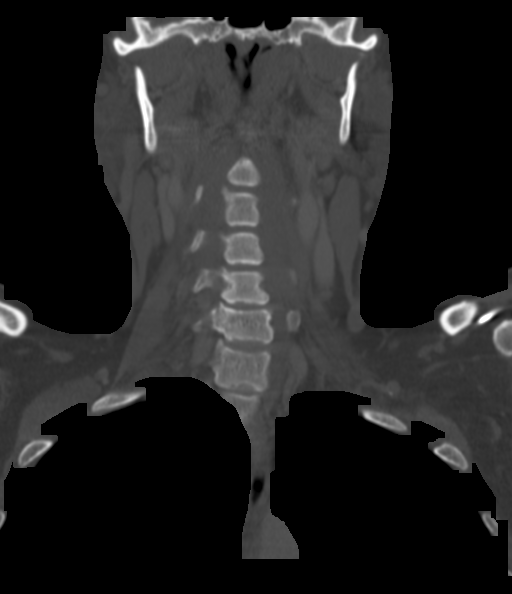
[im 62/101  bone]
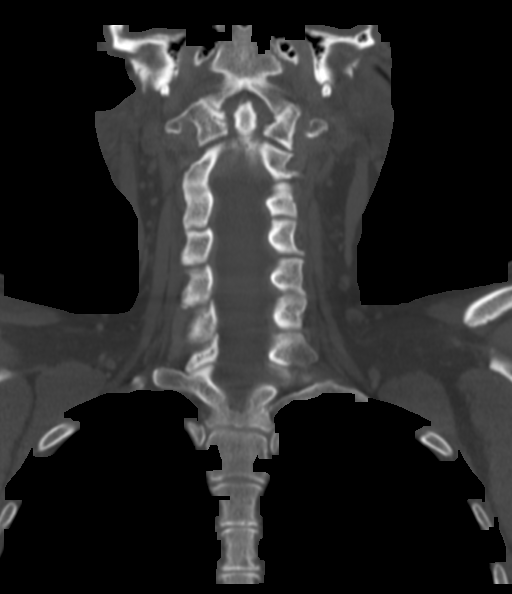
[im 70/101  soft-tissue]
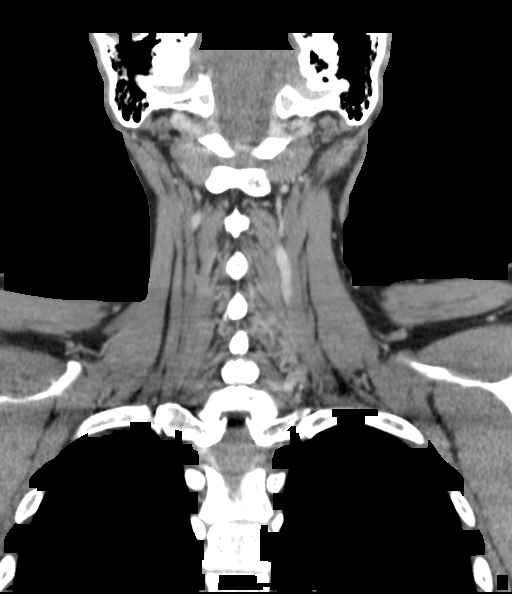
[im 70/101  bone]
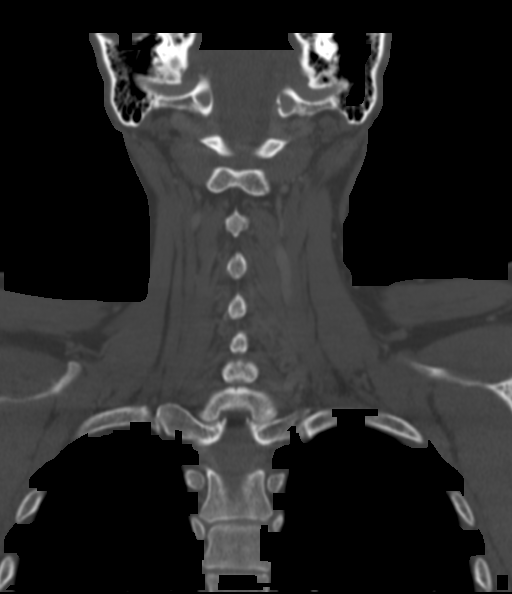
[im 77/101  bone]
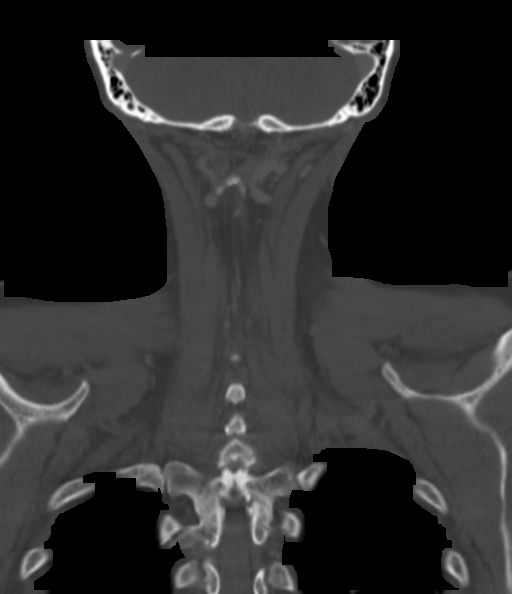
[im 85/101  bone]
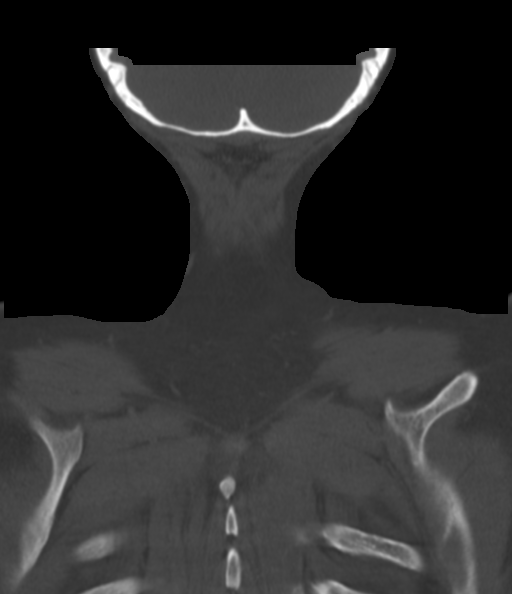
[im 93/101  bone]
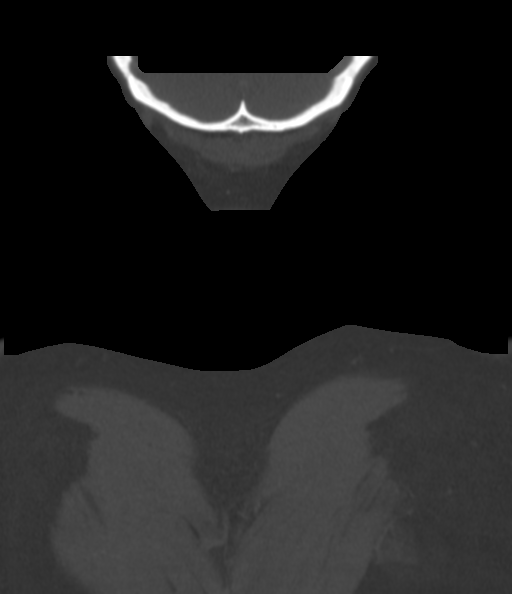

[12 of 14 positions shown; findings below may reference images not displayed]

FINDINGS: Pharynx and larynx: Symmetric pharyngeal soft tissues without
evidence of mass or swelling. No retropharyngeal fluid or edema.
Unremarkable larynx.

Salivary glands: A mass located along the posteromedial margin of
the right parotid tail measures 1.3 x 1.1 x 2.3 cm and does not
clearly arise from the parotid. The mass abuts the superficial
aspect of the sternocleidomastoid muscle. The mass is low density
and may be cystic, without a focal or clearly enhancing component
identified. The parotid and submandibular glands are otherwise
unremarkable. No inflammatory changes are present.

Thyroid: Small low-density nodules in both thyroid lobes measuring
up to approximately 8 mm in size.

Lymph nodes: Bilateral submandibular and submental lymph nodes
measure up to 6 mm in short axis, not enlarged by CT size criteria
and with grossly normal morphology. A small but asymmetric right
level IIa lymph node measures 6 mm in short axis.

Vascular: Major vascular structures of the neck are patent.

Limited intracranial: Unremarkable.

Visualized orbits: Unremarkable.

Mastoids and visualized paranasal sinuses: Clear.

Skeleton: Mild cervical spondylosis.  No suspicious osseous lesion.

Upper chest: Clear lung apices.

Other: None.
IMPRESSION: 1. 2.3 cm possibly cystic mass adjacent to the right parotid tail.
Considerations include branchial cleft cyst, lymphoepithelial cyst,
and lymphangioma. No clearly enhancing component is identified, and
cystic neoplasm and cystic lymph node are considered less likely.
2. Subcentimeter level I and II lymph nodes, likely benign.

## 2017-03-06 MED ORDER — IOPAMIDOL (ISOVUE-300) INJECTION 61%
75.0000 mL | Freq: Once | INTRAVENOUS | Status: AC | PRN
  Administered 2017-03-06: 75 mL via INTRAVENOUS

## 2017-03-11 DIAGNOSIS — R221 Localized swelling, mass and lump, neck: Secondary | ICD-10-CM | POA: Diagnosis not present

## 2017-03-11 DIAGNOSIS — R599 Enlarged lymph nodes, unspecified: Secondary | ICD-10-CM | POA: Diagnosis not present

## 2017-03-13 DIAGNOSIS — F411 Generalized anxiety disorder: Secondary | ICD-10-CM | POA: Diagnosis not present

## 2017-03-18 DIAGNOSIS — F411 Generalized anxiety disorder: Secondary | ICD-10-CM | POA: Diagnosis not present

## 2017-03-20 ENCOUNTER — Ambulatory Visit (INDEPENDENT_AMBULATORY_CARE_PROVIDER_SITE_OTHER): Payer: 59 | Admitting: Family Medicine

## 2017-03-20 VITALS — BP 116/75 | HR 77 | Temp 98.1°F | Ht 65.0 in | Wt 200.0 lb

## 2017-03-20 DIAGNOSIS — L659 Nonscarring hair loss, unspecified: Secondary | ICD-10-CM

## 2017-03-20 DIAGNOSIS — Z9189 Other specified personal risk factors, not elsewhere classified: Secondary | ICD-10-CM

## 2017-03-20 DIAGNOSIS — Z6833 Body mass index (BMI) 33.0-33.9, adult: Secondary | ICD-10-CM | POA: Diagnosis not present

## 2017-03-20 DIAGNOSIS — E7849 Other hyperlipidemia: Secondary | ICD-10-CM | POA: Diagnosis not present

## 2017-03-20 DIAGNOSIS — F3289 Other specified depressive episodes: Secondary | ICD-10-CM | POA: Diagnosis not present

## 2017-03-20 DIAGNOSIS — E559 Vitamin D deficiency, unspecified: Secondary | ICD-10-CM

## 2017-03-20 DIAGNOSIS — E669 Obesity, unspecified: Secondary | ICD-10-CM

## 2017-03-20 NOTE — Progress Notes (Signed)
Office: [phone]  /  Fax: [phone]   HPI:   Chief Complaint: OBESITY Jasmine Martinez is here to discuss her progress with her obesity treatment plan. She is on the lower carbohydrate, vegetable and lean protein rich diet plan and is following her eating plan approximately 25 % of the time. She states she is exercising 0 minutes 0 times per week. Jasmine Martinez continues to do well with weight loss, even while traveling to [place] and indulging more. She also walks 5 plus miles a day. Jasmine Martinez is doing well on Saxenda and she denies nausea, vomiting or hypoglycemia. Her weight is 200 lb (90.7 kg) today and has had a weight loss of 3 pounds over a period of 6 weeks since her last visit. She has lost 60 lbs since starting treatment with [place].  Hyperlipidemia [name] has hyperlipidemia and is stable on Lipitor currently. Jasmine Martinez is doing well with diet and exercise. She has been trying to improve her cholesterol levels with intensive lifestyle modification including a low saturated fat diet, exercise and weight loss. She denies any chest pain or myalgias.  At risk for cardiovascular disease Jasmine Martinez is at a higher than average risk for cardiovascular disease due to obesity and hyperlipidemia. She currently denies any chest pain.  Vitamin D deficiency Jasmine Martinez has a diagnosis of vitamin D deficiency. She is stable on vit D and denies nausea, vomiting or muscle weakness.   Ref. Range 01/20/2017 09:24  Vitamin D, 25-Hydroxy Latest Ref Range: 30.0 - 100.0 ng/mL 52.3   Diffuse Hair Loss Jasmine Martinez notes increased hair breakage in the last 6 weeks. There are no bald patches yet, but she notes increased shedding of hair, especially on her clothes. Jasmine Martinez has to use a clothes roller 1 to 2 times a day . She also notes increased night sweats.  Depression with emotional eating behaviors Jasmine Martinez mood is stable on Wellbutrin and she has decreased emotional eating. Her blood pressure is stable. Jasmine Martinez  struggles with emotional eating and using food for comfort to the extent that it is negatively impacting her health. She often snacks when she is not hungry. Jasmine Martinez sometimes feels she is out of control and then feels guilty that she made poor food choices. She has been working on behavior modification techniques to help reduce her emotional eating and has been somewhat successful. She shows no sign of suicidal or homicidal ideations.  Depression screen [facility] 2/9 04/23/2016 01/02/2016 02/14/2015 06/23/2014  Decreased Interest 0 1 0 1  Down, Depressed, Hopeless 0 1 0 1  PHQ - 2 Score 0 2 0 2  Altered sleeping - 2 - 1  Tired, decreased energy - 3 - 1  Change in appetite - 3 - 0  Feeling bad or failure about yourself  - 3 - 0  Trouble concentrating - 0 - 0  Moving slowly or fidgety/restless - 0 - 0  Suicidal thoughts - 0 - 0  PHQ-9 Score - 13 - 4  Difficult doing work/chores - - - Not difficult at all      ALLERGIES: Allergies  Allergen Reactions  . Codeine Shortness Of Breath  . Hydrocodone Shortness Of Breath    Mild SOB    MEDICATIONS: Current Outpatient Medications on File Prior to Visit  Medication Sig Dispense Refill  . ACCU-CHEK FASTCLIX LANCETS MISC 1 Bottle by Does not apply route every morning. 100 each 0  . ALPRAZolam (NIRAVAM) 0.5 MG dissolvable tablet Take 1 tablet (0.5 mg total) by mouth 2 (two) times daily as  needed for anxiety. 30 tablet 0  . ALPRAZolam (XANAX) 0.5 MG tablet Take 1 tablet (0.5 mg total) by mouth 2 (two) times daily as needed for anxiety. 30 tablet 0  . atorvastatin (LIPITOR) 10 MG tablet Take 1 tablet (10 mg total) by mouth daily. 30 tablet 0  . buPROPion (WELLBUTRIN SR) 200 MG 12 hr tablet Take 1 tablet (200 mg total) by mouth daily. 30 tablet 0  . dicyclomine (BENTYL) 20 MG tablet Take 1 tablet (20 mg total) by mouth 4 (four) times daily -  before meals and at bedtime. (Patient taking differently: Take 20 mg by mouth 3 (three) times daily with meals as  needed. ) 30 tablet 0  . diphenoxylate-atropine (LOMOTIL) 2.5-0.025 MG tablet Take 2 tablets by mouth 4 (four) times daily as needed for diarrhea or loose stools. 30 tablet 0  . glucose blood (ACCU-CHEK GUIDE) test strip Use as instructed 100 each 0  . levonorgestrel (MIRENA) 20 MCG/24HR IUD 1 each by Intrauterine route once.    . Liraglutide -Weight Management (SAXENDA) 18 MG/3ML SOPN Inject 3 mg into the skin daily. 5 pen 0  . Melatonin 10 MG TABS Take 1 tablet by mouth at bedtime. 30 tablet 0  . NOVOFINE 32G X 6 MM MISC INJECT AS DIRECTED DAILY. 90 each 5  . TRUE METRIX BLOOD GLUCOSE TEST test strip 1 each by Other route daily. 1 stick daily 100 each 0  . TRUEPLUS LANCETS 30G MISC Inject 1 Stick as directed daily. 100 each 0  . Vitamin D, Ergocalciferol, (DRISDOL) 50000 units CAPS capsule Take 1 capsule (50,000 Units total) by mouth every 3 (three) days. 10 capsule 0   No current facility-administered medications on file prior to visit.     PAST MEDICAL HISTORY: Past Medical History:  Diagnosis Date  . Anxiety   . Back pain   . Chest pain   . Depression   . Diabetes mellitus   . Diarrhea   . Edema    feet and legs  . Gallbladder problem   . Gastrointestinal stromal tumor (GIST) ([place])    removed 2016  . GERD (gastroesophageal reflux disease)   . Heartburn   . HTN (hypertension)   . Hyperlipidemia   . Obesity   . Palpitations   . PMDD (premenstrual dysphoric disorder) 01/02/2016  . Shortness of breath   . Vitamin D deficiency   . Vitamin D deficiency     PAST SURGICAL HISTORY: Past Surgical History:  Procedure Laterality Date  . APPENDECTOMY    . CHOLECYSTECTOMY    . EUS N/A 03/23/2014   Procedure: ESOPHAGEAL ENDOSCOPIC ULTRASOUND (EUS) RADIAL;  Surgeon: Arta Silence, MD;  Location: [facility] ENDOSCOPY;  Service: Endoscopy;  Laterality: N/A;  . FINE NEEDLE ASPIRATION N/A 03/23/2014   Procedure: FINE NEEDLE ASPIRATION (FNA) LINEAR;  Surgeon: Arta Silence, MD;  Location: [facility]  ENDOSCOPY;  Service: Endoscopy;  Laterality: N/A;  . TONSILLECTOMY      SOCIAL HISTORY: Social History   Tobacco Use  . Smoking status: Never Smoker  . Smokeless tobacco: Never Used  Substance Use Topics  . Alcohol use: No  . Drug use: No    FAMILY HISTORY: Family History  Problem Relation Age of Onset  . Healthy Mother   . Hypertension Mother   . Hyperlipidemia Mother   . Alcoholism Mother   . Anxiety disorder Mother   . Drug abuse Mother   . Healthy Father   . Colon cancer Paternal Grandfather 18  colon    ROS: Review of Systems  Constitutional: Positive for weight loss.       Positive for night sweats  Cardiovascular: Negative for chest pain.  Gastrointestinal: Negative for nausea and vomiting.  Musculoskeletal: Negative for myalgias.       Negative muscle weakness  Skin:       Positive for hair breakage Positive for hair shedding  Psychiatric/Behavioral: Positive for depression. Negative for suicidal ideas.    PHYSICAL EXAM: Blood pressure 116/75, pulse 77, temperature 98.1 F (36.7 C), temperature source Oral, height 5\' 5"  (1.651 m), weight 200 lb (90.7 kg), SpO2 100 %. Body mass index is 33.28 kg/m. Physical Exam  Constitutional: She is oriented to person, place, and time. She appears well-developed and well-nourished.  Cardiovascular: Normal rate.  Pulmonary/Chest: Effort normal.  Musculoskeletal: Normal range of motion.  Neurological: She is oriented to person, place, and time.  Skin: Skin is warm and dry.  Psychiatric: She has a normal mood and affect. Her behavior is normal.  Vitals reviewed.   RECENT LABS AND TESTS: BMET    Component Value Date/Time   NA 143 02/27/2017 1601   NA 141 09/25/2016 0811   K 4.3 02/27/2017 1601   CL 108 02/27/2017 1601   CO2 24 02/27/2017 1601   GLUCOSE 89 02/27/2017 1601   BUN 10 02/27/2017 1601   BUN 11 09/25/2016 0811   CREATININE 0.68 02/27/2017 1601   CALCIUM 9.4 02/27/2017 1601   GFRNONAA 106  02/27/2017 1601   GFRAA 123 02/27/2017 1601   Lab Results  Component Value Date   HGBA1C 4.7 (L) 01/20/2017   HGBA1C 4.9 09/25/2016   HGBA1C 5.5 04/18/2016   HGBA1C 6.3 (H) 01/02/2016   HGBA1C WILL FOLLOW 01/02/2016   Lab Results  Component Value Date   INSULIN 11.0 01/20/2017   INSULIN 11.7 09/25/2016   INSULIN 21.8 04/18/2016   INSULIN 15.3 01/02/2016   INSULIN WILL FOLLOW 01/02/2016   CBC    Component Value Date/Time   WBC 6.8 02/27/2017 1601   RBC 4.08 02/27/2017 1601   HGB 12.7 02/27/2017 1601   HGB 13.7 04/18/2016 1201   HCT 36.5 02/27/2017 1601   HCT 40.8 04/18/2016 1201   PLT 286 02/27/2017 1601   PLT 392 (H) 04/18/2016 1201   MCV 89.5 02/27/2017 1601   MCV 91 04/18/2016 1201   MCH 31.1 02/27/2017 1601   MCHC 34.8 02/27/2017 1601   RDW 12.2 02/27/2017 1601   RDW 12.8 04/18/2016 1201   LYMPHSABS 1,904 02/27/2017 1601   LYMPHSABS 2.4 04/18/2016 1201   MONOABS 0.6 05/05/2014 0902   EOSABS 68 02/27/2017 1601   EOSABS 0.1 04/18/2016 1201   BASOSABS 20 02/27/2017 1601   BASOSABS 0.0 04/18/2016 1201   Iron/TIBC/Ferritin/ %Sat No results found for: IRON, TIBC, FERRITIN, IRONPCTSAT Lipid Panel     Component Value Date/Time   CHOL 143 01/20/2017 0924   TRIG 68 01/20/2017 0924   HDL 37 (L) 01/20/2017 0924   CHOLHDL 3.4 05/05/2014 0902   VLDL 16 05/05/2014 0902   LDLCALC 92 01/20/2017 0924   Hepatic Function Panel     Component Value Date/Time   PROT 6.8 02/27/2017 1601   PROT 7.5 09/25/2016 0811   ALBUMIN 4.5 09/25/2016 0811   AST 12 02/27/2017 1601   ALT 20 02/27/2017 1601   ALKPHOS 105 09/25/2016 0811   BILITOT 0.3 02/27/2017 1601   BILITOT 0.7 09/25/2016 0811      Component Value Date/Time   TSH 3.260 09/25/2016  0811   TSH 2.090 04/18/2016 1201   TSH 2.940 01/02/2016 1503    ASSESSMENT AND PLAN: Other hyperlipidemia - Plan: atorvastatin (LIPITOR) 10 MG tablet  Vitamin D deficiency - Plan: CBC With Differential, VITAMIN D 25 Hydroxy (Vit-D  Deficiency, Fractures), Vitamin B12, Folate, Vitamin D, Ergocalciferol, (DRISDOL) 50000 units CAPS capsule  Hair loss - Diffuse - Plan: T3, T4, free, TSH  Other depression - with emotional eating - Plan: buPROPion (WELLBUTRIN SR) 200 MG 12 hr tablet  At risk for heart disease  Class 1 obesity with serious comorbidity and body mass index (BMI) of 33.0 to 33.9 in adult, unspecified obesity type  PLAN:  Hyperlipidemia Mikaella was informed of the American Heart Association Guidelines emphasizing intensive lifestyle modifications as the first line treatment for hyperlipidemia. We discussed many lifestyle modifications today in depth, and Aunesty will continue to work on decreasing saturated fats such as fatty red meat, butter and many fried foods. She will also increase vegetables and lean protein in her diet and continue to work on exercise and weight loss efforts. Yanci agrees to continue Lipitor 10 mg qd #30 with no refills and follow up as directed.  Cardiovascular risk counseling Amariah was given extended (15 minutes) coronary artery disease prevention counseling today. She is 47 y.o. female and has risk factors for heart disease including obesity and hyperlipidemia. We discussed intensive lifestyle modifications today with an emphasis on specific weight loss instructions and strategies. Pt was also informed of the importance of increasing exercise and decreasing saturated fats to help prevent heart disease.  Vitamin D Deficiency Lavayah was informed that low vitamin D levels contributes to fatigue and are associated with obesity, breast, and colon cancer. She agrees to continue to take prescription Vit D @50 ,000 IU every 3 days #10 with no refills. We will check labs and she will follow up for routine testing of vitamin D, at least 2-3 times per year. She was informed of the risk of over-replacement of vitamin D and agrees to not increase her dose unless she discusses this with [place] first.  Charidy agrees to follow up with our clinic in 2 weeks.  Diffuse Hair Loss We will check labs and follow  Depression with Emotional Eating Behaviors We discussed behavior modification techniques today to help Chanee deal with her emotional eating and depression. She has agreed to take Wellbutrin SR 200 mg qd #30 with no refills and follow up as directed.  Obesity Zarinah is currently in the action stage of change. As such, her goal is to continue with weight loss efforts She has agreed to follow a lower carbohydrate, vegetable and lean protein rich diet plan Syndey has been instructed to work up to a goal of 150 minutes of combined cardio and strengthening exercise per week for weight loss and overall health benefits. We discussed the following Behavioral Modification Strategies today: increasing lean protein intake, decreasing simple carbohydrates  and work on meal planning and easy cooking plans  We discussed various medication options to help Chunchula with her weight loss efforts and we both agreed to continue Saxenda 3.0 mg qd #5 pens with no refills.  Karson has agreed to follow up with our clinic in 2 weeks. She was informed of the importance of frequent follow up visits to maximize her success with intensive lifestyle modifications for her multiple health conditions.   I, Doreene Nest, am acting as transcriptionist for Dennard Nip, MD  I have reviewed the above documentation for accuracy and completeness, and  I agree with the above. -Dennard Nip, MD

## 2017-03-21 DIAGNOSIS — R221 Localized swelling, mass and lump, neck: Secondary | ICD-10-CM | POA: Diagnosis not present

## 2017-03-21 LAB — CBC WITH DIFFERENTIAL
BASOS ABS: 0 10*3/uL (ref 0.0–0.2)
Basos: 0 %
EOS (ABSOLUTE): 0.1 10*3/uL (ref 0.0–0.4)
Eos: 1 %
Hematocrit: 42.2 % (ref 34.0–46.6)
Hemoglobin: 13.9 g/dL (ref 11.1–15.9)
Immature Grans (Abs): 0 10*3/uL (ref 0.0–0.1)
Immature Granulocytes: 0 %
LYMPHS ABS: 1.7 10*3/uL (ref 0.7–3.1)
Lymphs: 17 %
MCH: 30.9 pg (ref 26.6–33.0)
MCHC: 32.9 g/dL (ref 31.5–35.7)
MCV: 94 fL (ref 79–97)
MONOS ABS: 0.5 10*3/uL (ref 0.1–0.9)
Monocytes: 5 %
NEUTROS ABS: 7.7 10*3/uL — AB (ref 1.4–7.0)
NEUTROS PCT: 77 %
RBC: 4.5 x10E6/uL (ref 3.77–5.28)
RDW: 13.2 % (ref 12.3–15.4)
WBC: 10 10*3/uL (ref 3.4–10.8)

## 2017-03-21 LAB — FOLATE: Folate: 10.2 ng/mL (ref >3.0)

## 2017-03-21 LAB — VITAMIN B12: Vitamin B-12: 303 pg/mL (ref 232–1245)

## 2017-03-21 LAB — VITAMIN D 25 HYDROXY (VIT D DEFICIENCY, FRACTURES): Vit D, 25-Hydroxy: 30.7 ng/mL (ref 30.0–100.0)

## 2017-03-21 LAB — T4, FREE: Free T4: 1.24 ng/dL (ref 0.82–1.77)

## 2017-03-21 LAB — TSH: TSH: 3.18 u[IU]/mL (ref 0.450–4.500)

## 2017-03-21 LAB — T3: T3, Total: 116 ng/dL (ref 71–180)

## 2017-03-21 MED FILL — OXYCODONE-ACETAMINOPHEN 5-3: 5-325 | 5 days supply | Qty: 30 | Fill #0

## 2017-03-23 ENCOUNTER — Encounter (INDEPENDENT_AMBULATORY_CARE_PROVIDER_SITE_OTHER): Payer: Self-pay | Admitting: Family Medicine

## 2017-03-24 MED ORDER — ATORVASTATIN CALCIUM 10 MG PO TABS: 10.0000 mg | ORAL_TABLET | Freq: Every day | ORAL | 0 refills | Status: DC

## 2017-03-24 MED ORDER — BUPROPION HCL ER (SR) 200 MG PO TB12: 200.0000 mg | ORAL_TABLET | Freq: Every day | ORAL | 0 refills | Status: DC

## 2017-03-24 MED ORDER — VITAMIN D (ERGOCALCIFEROL) 1.25 MG (50000 UNIT) PO CAPS: 50000.0000 [IU] | ORAL_CAPSULE | ORAL | 0 refills | Status: DC

## 2017-03-24 MED FILL — BUPROPION HCL SR 200 MG TAB: 200 | 30 days supply | Qty: 30 | Fill #0

## 2017-03-24 MED FILL — VIT D2 1.25 MG (50,000 UNIT: 1.25 MG | 30 days supply | Qty: 10 | Fill #0

## 2017-03-24 MED FILL — ATORVASTATIN 10 MG TABLET: 10 | 30 days supply | Qty: 30 | Fill #0

## 2017-03-27 ENCOUNTER — Ambulatory Visit (INDEPENDENT_AMBULATORY_CARE_PROVIDER_SITE_OTHER): Payer: 59 | Admitting: Family Medicine

## 2017-03-27 DIAGNOSIS — F411 Generalized anxiety disorder: Secondary | ICD-10-CM | POA: Diagnosis not present

## 2017-03-28 ENCOUNTER — Other Ambulatory Visit: Payer: Self-pay

## 2017-03-28 ENCOUNTER — Encounter (HOSPITAL_BASED_OUTPATIENT_CLINIC_OR_DEPARTMENT_OTHER): Payer: Self-pay | Admitting: *Deleted

## 2017-03-29 NOTE — H&P (Signed)
Otolaryngology Clinic Note  HPI:    Jasmine Martinez is a 47 y.o. female patient of Elby Showers, MD for reevaluation of right neck mass.  We aspirated fluid 5 days ago.  Cytopathology suggested benign, possibly salivary.  The lesion has filled up again and now has some low-grade pain.  No external skin changes.  No trismus.  No swelling with meals.  I did review her previous CT scan. PMH/Meds/All/SocHx/FamHx/ROS:   Past Medical History      Past Medical History:  Diagnosis Date  . Diabetes mellitus ([place])   . Stomach cancer [facility])       Past Surgical History       Past Surgical History:  Procedure Laterality Date  . APPENDECTOMY    . GALLBLADDER SURGERY    . STOMACH SURGERY    . TONSILLECTOMY    . WISDOM TOOTH EXTRACTION        No family history of bleeding disorders, wound healing problems or difficulty with anesthesia.   Social History  Social History        Social History  . Marital status: Married    Spouse name: N/A  . Number of children: N/A  . Years of education: N/A      Occupational History  . Not on file.       Social History Main Topics  . Smoking status: Never Smoker  . Smokeless tobacco: Never Used  . Alcohol use Not on file  . Drug use: Unknown  . Sexual activity: Not on file       Other Topics Concern  . Not on file      Social History Narrative  . No narrative on file       Current Outpatient Prescriptions:  .  atorvastatin (LIPITOR) 10 MG tablet, Take 10 mg by mouth., Disp: , Rfl:  .  buPROPion SR (WELLBUTRIN SR) 200 MG 12 hr tablet, Take 200 mg by mouth., Disp: , Rfl:  .  liraglutide (VICTOZA 3-PAK) 0.6 mg/0.1 mL (18 mg/3 mL) PnIj injection, Victoza 3-Pak 0.6 mg/0.1 mL (18 mg/3 mL) subcutaneous pen injector  INJECT 1 8 MG INTO THE SKIN EVERY MORNING, Disp: , Rfl:  .  VITAMIN D2 50,000 unit capsule, TAKE 1 CAPSULE BY MOUTH EVERY 3 DAYS, Disp: , Rfl: 0 .  oxyCODONE-acetaminophen (PERCOCET) 5-325 mg per  tablet, Take 1-2 tablets by mouth every 4 (four) hours as needed for up to 5 days., Disp: 30 tablet, Rfl: 0  A complete ROS was performed with pertinent positives/negatives noted in the HPI. The remainder of the ROS are negative.    Physical Exam:    There were no vitals taken for this visit. Once again there is a rubbery mass at the right tail of the parotid gland.  Facial nerve is entirely intact. Lungs: Clear to auscultation Heart: Regular rate and rhythm without murmurs Abdomen: Soft, active Extremities: Normal configuration Neurologic: Symmetric, grossly intact.     Impression & Plans:   This lesion seems too superficial to be a branchial cleft cyst.  It may be a parotid cyst, or a cystic tumor or lymph node.  Plan: I would like to remove this with a superficial parotidectomy approach.  I discussed this with her in detail including risks and complications.  Questions were answered and informed consent was obtained.  A routine preoperative  physical was performed without contraindications.  I sent in prescriptions for oxycodone, and will have her purchase Hibiclens surgical scrub for preoperative use.  She  will stay overnight one night in the hospital and then back here in the office 10 days postop.   Lilyan Gilford, MD  0/06/2374

## 2017-03-31 ENCOUNTER — Other Ambulatory Visit: Payer: Self-pay

## 2017-03-31 ENCOUNTER — Encounter (HOSPITAL_BASED_OUTPATIENT_CLINIC_OR_DEPARTMENT_OTHER)
Admission: RE | Admit: 2017-03-31 | Discharge: 2017-03-31 | Disposition: A | Discharge status: Home / Self Care | Payer: 59 | Source: Ambulatory Visit | Attending: Otolaryngology | Admitting: Otolaryngology

## 2017-03-31 DIAGNOSIS — K116 Mucocele of salivary gland: Secondary | ICD-10-CM | POA: Diagnosis not present

## 2017-03-31 DIAGNOSIS — G4730 Sleep apnea, unspecified: Secondary | ICD-10-CM | POA: Diagnosis not present

## 2017-03-31 DIAGNOSIS — Z85028 Personal history of other malignant neoplasm of stomach: Secondary | ICD-10-CM | POA: Diagnosis not present

## 2017-03-31 DIAGNOSIS — K118 Other diseases of salivary glands: Secondary | ICD-10-CM | POA: Diagnosis present

## 2017-03-31 DIAGNOSIS — I10 Essential (primary) hypertension: Secondary | ICD-10-CM | POA: Diagnosis not present

## 2017-03-31 DIAGNOSIS — Z6833 Body mass index (BMI) 33.0-33.9, adult: Secondary | ICD-10-CM | POA: Diagnosis not present

## 2017-03-31 DIAGNOSIS — Z885 Allergy status to narcotic agent status: Secondary | ICD-10-CM | POA: Diagnosis not present

## 2017-03-31 DIAGNOSIS — E119 Type 2 diabetes mellitus without complications: Secondary | ICD-10-CM | POA: Diagnosis not present

## 2017-03-31 DIAGNOSIS — Z7984 Long term (current) use of oral hypoglycemic drugs: Secondary | ICD-10-CM | POA: Diagnosis not present

## 2017-03-31 DIAGNOSIS — Z79899 Other long term (current) drug therapy: Secondary | ICD-10-CM | POA: Diagnosis not present

## 2017-03-31 LAB — BASIC METABOLIC PANEL
ANION GAP: 12 (ref 5–15)
BUN: 17 mg/dL (ref 6–20)
CALCIUM: 9.2 mg/dL (ref 8.9–10.3)
CO2: 19 mmol/L — AB (ref 22–32)
Chloride: 109 mmol/L (ref 101–111)
Creatinine, Ser: 0.64 mg/dL (ref 0.44–1.00)
GFR calc non Af Amer: >60 mL/min (ref >60)
Glucose, Bld: 102 mg/dL — ABNORMAL HIGH (ref 65–99)
Potassium: 4.4 mmol/L (ref 3.5–5.1)
Sodium: 140 mmol/L (ref 135–145)

## 2017-03-31 MED FILL — SAXENDA 18 MG/3 ML PEN: 18 | 30 days supply | Qty: 15 | Fill #0

## 2017-03-31 NOTE — Progress Notes (Signed)
Dr. Gifford Shave reviewed EKG - ok for surgery.

## 2017-04-01 DIAGNOSIS — F411 Generalized anxiety disorder: Secondary | ICD-10-CM | POA: Diagnosis not present

## 2017-04-02 ENCOUNTER — Ambulatory Visit (HOSPITAL_BASED_OUTPATIENT_CLINIC_OR_DEPARTMENT_OTHER): Payer: 59 | Admitting: Anesthesiology

## 2017-04-02 ENCOUNTER — Encounter (HOSPITAL_BASED_OUTPATIENT_CLINIC_OR_DEPARTMENT_OTHER)
Admission: RE | Disposition: A | Discharge status: Home / Self Care | Payer: Self-pay | Source: Ambulatory Visit | Attending: Otolaryngology

## 2017-04-02 ENCOUNTER — Ambulatory Visit (HOSPITAL_BASED_OUTPATIENT_CLINIC_OR_DEPARTMENT_OTHER)
Admission: RE | Admit: 2017-04-02 | Discharge: 2017-04-03 | Disposition: A | Discharge status: Home / Self Care | Payer: 59 | Source: Ambulatory Visit | Attending: Otolaryngology | Admitting: Otolaryngology

## 2017-04-02 ENCOUNTER — Encounter (INDEPENDENT_AMBULATORY_CARE_PROVIDER_SITE_OTHER): Payer: Self-pay

## 2017-04-02 ENCOUNTER — Encounter (HOSPITAL_BASED_OUTPATIENT_CLINIC_OR_DEPARTMENT_OTHER): Payer: Self-pay

## 2017-04-02 ENCOUNTER — Ambulatory Visit (INDEPENDENT_AMBULATORY_CARE_PROVIDER_SITE_OTHER): Payer: 59 | Admitting: Family Medicine

## 2017-04-02 ENCOUNTER — Other Ambulatory Visit: Payer: Self-pay

## 2017-04-02 DIAGNOSIS — Z7984 Long term (current) use of oral hypoglycemic drugs: Secondary | ICD-10-CM | POA: Diagnosis not present

## 2017-04-02 DIAGNOSIS — K119 Disease of salivary gland, unspecified: Secondary | ICD-10-CM | POA: Diagnosis not present

## 2017-04-02 DIAGNOSIS — Z885 Allergy status to narcotic agent status: Secondary | ICD-10-CM | POA: Insufficient documentation

## 2017-04-02 DIAGNOSIS — E119 Type 2 diabetes mellitus without complications: Secondary | ICD-10-CM | POA: Insufficient documentation

## 2017-04-02 DIAGNOSIS — Z85028 Personal history of other malignant neoplasm of stomach: Secondary | ICD-10-CM | POA: Diagnosis not present

## 2017-04-02 DIAGNOSIS — E7849 Other hyperlipidemia: Secondary | ICD-10-CM | POA: Diagnosis not present

## 2017-04-02 DIAGNOSIS — D37030 Neoplasm of uncertain behavior of the parotid salivary glands: Secondary | ICD-10-CM | POA: Diagnosis present

## 2017-04-02 DIAGNOSIS — Z6833 Body mass index (BMI) 33.0-33.9, adult: Secondary | ICD-10-CM | POA: Insufficient documentation

## 2017-04-02 DIAGNOSIS — R221 Localized swelling, mass and lump, neck: Secondary | ICD-10-CM | POA: Diagnosis not present

## 2017-04-02 DIAGNOSIS — Z79899 Other long term (current) drug therapy: Secondary | ICD-10-CM | POA: Insufficient documentation

## 2017-04-02 DIAGNOSIS — G4730 Sleep apnea, unspecified: Secondary | ICD-10-CM | POA: Diagnosis not present

## 2017-04-02 DIAGNOSIS — F419 Anxiety disorder, unspecified: Secondary | ICD-10-CM | POA: Diagnosis not present

## 2017-04-02 DIAGNOSIS — I10 Essential (primary) hypertension: Secondary | ICD-10-CM | POA: Diagnosis not present

## 2017-04-02 DIAGNOSIS — K116 Mucocele of salivary gland: Secondary | ICD-10-CM | POA: Diagnosis not present

## 2017-04-02 HISTORY — DX: Other diseases of salivary glands: K11.8

## 2017-04-02 HISTORY — PX: PAROTIDECTOMY: SHX2163

## 2017-04-02 SURGERY — EXCISION, PAROTID GLAND
Anesthesia: General | Site: Neck | Laterality: Right

## 2017-04-02 MED ORDER — EPHEDRINE SULFATE 50 MG/ML IJ SOLN
INTRAMUSCULAR | Status: DC | PRN
  Administered 2017-04-02: 10 mg via INTRAVENOUS

## 2017-04-02 MED ORDER — LACTATED RINGERS IV SOLN
INTRAVENOUS | Status: DC
  Administered 2017-04-02 (×2): via INTRAVENOUS

## 2017-04-02 MED ORDER — CHLORHEXIDINE GLUCONATE CLOTH 2 % EX PADS: 6.0000 | MEDICATED_PAD | Freq: Once | CUTANEOUS | Status: DC

## 2017-04-02 MED ORDER — SUCCINYLCHOLINE CHLORIDE 20 MG/ML IJ SOLN
INTRAMUSCULAR | Status: DC | PRN
  Administered 2017-04-02: 80 mg via INTRAVENOUS

## 2017-04-02 MED ORDER — OXYCODONE HCL 5 MG PO TABS
5.0000 mg | ORAL_TABLET | ORAL | Status: DC | PRN
  Administered 2017-04-02 – 2017-04-03 (×2): 10 mg via ORAL
  Filled 2017-04-02 (×2): qty 2

## 2017-04-02 MED ORDER — BACITRACIN ZINC 500 UNIT/GM EX OINT
TOPICAL_OINTMENT | CUTANEOUS | Status: AC
Start: 2017-04-02 — End: 2017-04-02
  Filled 2017-04-02: qty 2.7

## 2017-04-02 MED ORDER — SCOPOLAMINE 1 MG/3DAYS TD PT72
MEDICATED_PATCH | TRANSDERMAL | Status: AC
  Filled 2017-04-02: qty 1

## 2017-04-02 MED ORDER — LIDOCAINE-EPINEPHRINE 1 %-1:100000 IJ SOLN
INTRAMUSCULAR | Status: DC | PRN
  Administered 2017-04-02: 6.5 mL

## 2017-04-02 MED ORDER — LIDOCAINE-EPINEPHRINE 1 %-1:100000 IJ SOLN
INTRAMUSCULAR | Status: AC
Start: 2017-04-02 — End: 2017-04-02
  Filled 2017-04-02: qty 1

## 2017-04-02 MED ORDER — IBUPROFEN 100 MG/5ML PO SUSP: 400.0000 mg | Freq: Four times a day (QID) | ORAL | Status: DC | PRN

## 2017-04-02 MED ORDER — FENTANYL CITRATE (PF) 100 MCG/2ML IJ SOLN
25.0000 ug | INTRAMUSCULAR | Status: DC | PRN
  Administered 2017-04-02: 50 ug via INTRAVENOUS

## 2017-04-02 MED ORDER — 0.9 % SODIUM CHLORIDE (POUR BTL) OPTIME
TOPICAL | Status: DC | PRN
  Administered 2017-04-02: 120 mL

## 2017-04-02 MED ORDER — ONDANSETRON HCL 4 MG PO TABS: 4.0000 mg | ORAL_TABLET | ORAL | Status: DC | PRN

## 2017-04-02 MED ORDER — PROPOFOL 10 MG/ML IV BOLUS
INTRAVENOUS | Status: DC | PRN
  Administered 2017-04-02: 150 mg via INTRAVENOUS

## 2017-04-02 MED ORDER — DEXTROSE-NACL 5-0.45 % IV SOLN
INTRAVENOUS | Status: DC
  Administered 2017-04-02: 11:00:00 via INTRAVENOUS

## 2017-04-02 MED ORDER — SUCCINYLCHOLINE CHLORIDE 200 MG/10ML IV SOSY
PREFILLED_SYRINGE | INTRAVENOUS | Status: AC
  Filled 2017-04-02: qty 10

## 2017-04-02 MED ORDER — MIDAZOLAM HCL 2 MG/2ML IJ SOLN
1.0000 mg | INTRAMUSCULAR | Status: DC | PRN
  Administered 2017-04-02: 2 mg via INTRAVENOUS

## 2017-04-02 MED ORDER — OXYCODONE HCL 5 MG PO TABS
5.0000 mg | ORAL_TABLET | Freq: Once | ORAL | Status: AC | PRN
  Administered 2017-04-02: 5 mg via ORAL

## 2017-04-02 MED ORDER — PROMETHAZINE HCL 25 MG/ML IJ SOLN: 6.2500 mg | INTRAMUSCULAR | Status: DC | PRN

## 2017-04-02 MED ORDER — OXYCODONE HCL 5 MG PO TABS
ORAL_TABLET | ORAL | Status: AC
  Filled 2017-04-02: qty 1

## 2017-04-02 MED ORDER — LIDOCAINE HCL (CARDIAC) 20 MG/ML IV SOLN
INTRAVENOUS | Status: AC
  Filled 2017-04-02: qty 5

## 2017-04-02 MED ORDER — OXYCODONE HCL 5 MG/5ML PO SOLN: 5.0000 mg | Freq: Once | ORAL | Status: AC | PRN

## 2017-04-02 MED ORDER — SCOPOLAMINE 1 MG/3DAYS TD PT72
1.0000 | MEDICATED_PATCH | Freq: Once | TRANSDERMAL | Status: DC | PRN
  Administered 2017-04-02: 1.5 mg via TRANSDERMAL

## 2017-04-02 MED ORDER — ONDANSETRON HCL 4 MG/2ML IJ SOLN
INTRAMUSCULAR | Status: AC
  Filled 2017-04-02: qty 2

## 2017-04-02 MED ORDER — SUFENTANIL CITRATE 50 MCG/ML IV SOLN
INTRAVENOUS | Status: AC
  Filled 2017-04-02: qty 1

## 2017-04-02 MED ORDER — EPHEDRINE 5 MG/ML INJ
INTRAVENOUS | Status: AC
  Filled 2017-04-02: qty 10

## 2017-04-02 MED ORDER — DEXAMETHASONE SODIUM PHOSPHATE 10 MG/ML IJ SOLN
INTRAMUSCULAR | Status: AC
  Filled 2017-04-02: qty 1

## 2017-04-02 MED ORDER — PHENYLEPHRINE 40 MCG/ML (10ML) SYRINGE FOR IV PUSH (FOR BLOOD PRESSURE SUPPORT)
PREFILLED_SYRINGE | INTRAVENOUS | Status: AC
  Filled 2017-04-02: qty 10

## 2017-04-02 MED ORDER — ONDANSETRON HCL 4 MG/2ML IJ SOLN
INTRAMUSCULAR | Status: DC | PRN
  Administered 2017-04-02: 4 mg via INTRAVENOUS

## 2017-04-02 MED ORDER — PROPOFOL 500 MG/50ML IV EMUL
INTRAVENOUS | Status: AC
  Filled 2017-04-02: qty 50

## 2017-04-02 MED ORDER — DEXAMETHASONE SODIUM PHOSPHATE 4 MG/ML IJ SOLN
INTRAMUSCULAR | Status: DC | PRN
  Administered 2017-04-02: 10 mg via INTRAVENOUS

## 2017-04-02 MED ORDER — ACETAMINOPHEN 120 MG RE SUPP
RECTAL | Status: AC
Start: 2017-04-02 — End: 2017-04-02
  Filled 2017-04-02: qty 1

## 2017-04-02 MED ORDER — FENTANYL CITRATE (PF) 100 MCG/2ML IJ SOLN: 50.0000 ug | INTRAMUSCULAR | Status: DC | PRN

## 2017-04-02 MED ORDER — SUFENTANIL CITRATE 50 MCG/ML IV SOLN
INTRAVENOUS | Status: DC | PRN
  Administered 2017-04-02 (×3): 5 ug via INTRAVENOUS

## 2017-04-02 MED ORDER — ONDANSETRON HCL 4 MG/2ML IJ SOLN: 4.0000 mg | INTRAMUSCULAR | Status: DC | PRN

## 2017-04-02 MED ORDER — FENTANYL CITRATE (PF) 100 MCG/2ML IJ SOLN
INTRAMUSCULAR | Status: AC
  Filled 2017-04-02: qty 2

## 2017-04-02 MED ORDER — MEPERIDINE HCL 25 MG/ML IJ SOLN: 6.2500 mg | INTRAMUSCULAR | Status: DC | PRN

## 2017-04-02 MED ORDER — MIDAZOLAM HCL 2 MG/2ML IJ SOLN
INTRAMUSCULAR | Status: AC
  Filled 2017-04-02: qty 2

## 2017-04-02 SURGICAL SUPPLY — 87 items
ADH SKN CLS APL DERMABOND .7 (GAUZE/BANDAGES/DRESSINGS) ×1
APL SKNCLS STERI-STRIP NONHPOA (GAUZE/BANDAGES/DRESSINGS)
APPLICATOR COTTON TIP 6IN STRL (MISCELLANEOUS) ×3 IMPLANT
ATTRACTOMAT 16X20 MAGNETIC DRP (DRAPES) ×1 IMPLANT
BENZOIN TINCTURE PRP APPL 2/3 (GAUZE/BANDAGES/DRESSINGS) IMPLANT
BLADE SCALPEL THERMAL 15 (MISCELLANEOUS) ×3 IMPLANT
BLADE SURG 12 STRL SS (BLADE) IMPLANT
BLADE SURG 15 STRL LF DISP TIS (BLADE) ×1 IMPLANT
BLADE SURG 15 STRL SS (BLADE) ×2
BNDG CONFORM 3 STRL LF (GAUZE/BANDAGES/DRESSINGS) IMPLANT
BNDG GAUZE ELAST 4 BULKY (GAUZE/BANDAGES/DRESSINGS) IMPLANT
CANISTER SUCT 1200ML W/VALVE (MISCELLANEOUS) ×2 IMPLANT
CLEANER CAUTERY TIP 5X5 PAD (MISCELLANEOUS) IMPLANT
CORD BIPOLAR FORCEPS 12FT (ELECTRODE) ×2 IMPLANT
COVER BACK TABLE 60X90IN (DRAPES) ×2 IMPLANT
COVER MAYO STAND STRL (DRAPES) ×2 IMPLANT
DECANTER SPIKE VIAL GLASS SM (MISCELLANEOUS) ×2 IMPLANT
DERMABOND ADVANCED (GAUZE/BANDAGES/DRESSINGS) ×1
DERMABOND ADVANCED .7 DNX12 (GAUZE/BANDAGES/DRESSINGS) IMPLANT
DRAIN CHANNEL 7F FF FLAT (WOUND CARE) IMPLANT
DRAIN JP 10F RND SILICONE (MISCELLANEOUS) ×1 IMPLANT
DRAIN PENROSE 1/4X12 LTX STRL (WOUND CARE) IMPLANT
DRAIN SNY WOU 7FLT (WOUND CARE) IMPLANT
DRAIN TLS ROUND 10FR (DRAIN) IMPLANT
DRAPE INCISE 23X17 IOBAN STRL (DRAPES) ×1
DRAPE INCISE 23X17 STRL (DRAPES) ×1 IMPLANT
DRAPE INCISE IOBAN 23X17 STRL (DRAPES) ×1 IMPLANT
DRAPE U-SHAPE 76X120 STRL (DRAPES) ×2 IMPLANT
ELECT COATED BLADE 2.86 ST (ELECTRODE) ×2 IMPLANT
ELECT PAIRED SUBDERMAL (MISCELLANEOUS) ×2
ELECT REM PT RETURN 9FT ADLT (ELECTROSURGICAL) ×2
ELECTRODE PAIRED SUBDERMAL (MISCELLANEOUS) IMPLANT
ELECTRODE REM PT RTRN 9FT ADLT (ELECTROSURGICAL) ×1 IMPLANT
EVACUATOR 3/16  PVC DRAIN (DRAIN)
EVACUATOR 3/16 PVC DRAIN (DRAIN) IMPLANT
EVACUATOR SILICONE 100CC (DRAIN) IMPLANT
FORCEPS BIPOLAR SPETZLER 8 1.0 (NEUROSURGERY SUPPLIES) ×2 IMPLANT
GAUZE SPONGE 4X4 12PLY STRL LF (GAUZE/BANDAGES/DRESSINGS) IMPLANT
GAUZE SPONGE 4X4 16PLY XRAY LF (GAUZE/BANDAGES/DRESSINGS) IMPLANT
GLOVE BIO SURGEON STRL SZ 6.5 (GLOVE) ×2 IMPLANT
GLOVE BIO SURGEON STRL SZ7.5 (GLOVE) IMPLANT
GLOVE BIOGEL PI IND STRL 7.0 (GLOVE) IMPLANT
GLOVE BIOGEL PI IND STRL 8 (GLOVE) IMPLANT
GLOVE BIOGEL PI INDICATOR 7.0 (GLOVE) ×1
GLOVE BIOGEL PI INDICATOR 8 (GLOVE)
GLOVE ECLIPSE 7.5 STRL STRAW (GLOVE) IMPLANT
GLOVE ECLIPSE 8.0 STRL XLNG CF (GLOVE) ×2 IMPLANT
GLOVE SS BIOGEL STRL SZ 7.5 (GLOVE) IMPLANT
GLOVE SUPERSENSE BIOGEL SZ 7.5 (GLOVE)
GLOVE SURG SS PI 7.0 STRL IVOR (GLOVE) ×1 IMPLANT
GOWN STRL REUS W/ TWL LRG LVL3 (GOWN DISPOSABLE) ×1 IMPLANT
GOWN STRL REUS W/ TWL XL LVL3 (GOWN DISPOSABLE) ×1 IMPLANT
GOWN STRL REUS W/TWL LRG LVL3 (GOWN DISPOSABLE) ×4
GOWN STRL REUS W/TWL XL LVL3 (GOWN DISPOSABLE) ×2
LOCATOR NERVE 3 VOLT (DISPOSABLE) ×1 IMPLANT
NDL HYPO 25X1 1.5 SAFETY (NEEDLE) ×1 IMPLANT
NEEDLE HYPO 25X1 1.5 SAFETY (NEEDLE) ×2 IMPLANT
NS IRRIG 1000ML POUR BTL (IV SOLUTION) ×2 IMPLANT
PACK BASIN DAY SURGERY FS (CUSTOM PROCEDURE TRAY) ×2 IMPLANT
PAD CLEANER CAUTERY TIP 5X5 (MISCELLANEOUS)
PENCIL BUTTON HOLSTER BLD 10FT (ELECTRODE) ×2 IMPLANT
PIN SAFETY STERILE (MISCELLANEOUS) IMPLANT
PROBE NERVBE PRASS .33 (MISCELLANEOUS) ×1 IMPLANT
SHEARS HARMONIC 9CM CVD (BLADE) ×1 IMPLANT
SHEET MEDIUM DRAPE 40X70 STRL (DRAPES) IMPLANT
SLEEVE SCD COMPRESS KNEE MED (MISCELLANEOUS) ×2 IMPLANT
STAPLER VISISTAT 35W (STAPLE) ×2 IMPLANT
STRIP CLOSURE SKIN 1/4X4 (GAUZE/BANDAGES/DRESSINGS) IMPLANT
SUCTION FRAZIER HANDLE 10FR (MISCELLANEOUS)
SUCTION TUBE FRAZIER 10FR DISP (MISCELLANEOUS) IMPLANT
SUT CHROMIC 4 0 PS 2 18 (SUTURE) ×4 IMPLANT
SUT ETHILON 3 0 PS 1 (SUTURE) ×2 IMPLANT
SUT ETHILON 5 0 P 3 18 (SUTURE) ×1
SUT NYLON ETHILON 5-0 P-3 1X18 (SUTURE) IMPLANT
SUT SILK 2 0 PERMA HAND 18 BK (SUTURE) ×4 IMPLANT
SUT SILK 2 0 TIES 17X18 (SUTURE)
SUT SILK 2-0 18XBRD TIE BLK (SUTURE) IMPLANT
SUT SILK 3 0 SH CR/8 (SUTURE) ×2 IMPLANT
SUT SILK 3 0 TIES 17X18 (SUTURE) ×2
SUT SILK 3-0 18XBRD TIE BLK (SUTURE) ×1 IMPLANT
SUT SILK 4 0 TIES 17X18 (SUTURE) IMPLANT
SYR BULB 3OZ (MISCELLANEOUS) ×2 IMPLANT
SYR CONTROL 10ML LL (SYRINGE) ×2 IMPLANT
TOWEL OR 17X24 6PK STRL BLUE (TOWEL DISPOSABLE) ×4 IMPLANT
TRAY DSU PREP LF (CUSTOM PROCEDURE TRAY) ×2 IMPLANT
TUBE CONNECTING 20X1/4 (TUBING) ×2 IMPLANT
YANKAUER SUCT BULB TIP NO VENT (SUCTIONS) IMPLANT

## 2017-04-02 NOTE — Transfer of Care (Signed)
Immediate Anesthesia Transfer of Care Note  Patient: Jasmine Martinez  Procedure(s) Performed: RIGHT SUPERFICIAL PAROTIDECTOMY (Right Neck)  Patient Location: PACU  Anesthesia Type:General  Level of Consciousness: awake, alert , oriented and drowsy  Airway & Oxygen Therapy: Patient Spontanous Breathing and Patient connected to face mask oxygen  Post-op Assessment: Report given to RN and Post -op Vital signs reviewed and stable  Post vital signs: Reviewed and stable  Last Vitals:  Vitals:   04/02/17 0743  BP: 119/70  Pulse: 85  Resp: 18  Temp: 36.6 C  SpO2: 100%    Last Pain:  Vitals:   04/02/17 0743  TempSrc: Oral         Complications: No apparent anesthesia complications

## 2017-04-02 NOTE — Interval H&P Note (Signed)
History and Physical Interval Note:  04/02/2017 8:28 AM  Jasmine Martinez  has presented today for surgery, with the diagnosis of RIGHT PAROTID MASS  The various methods of treatment have been discussed with the patient and family. After consideration of risks, benefits and other options for treatment, the patient has consented to  Procedure(s): RIGHT SUPERFICIAL PAROTIDECTOMY (Right) as a surgical intervention .  The patient's history has been re-reviewed, patient re-examined, no change in status, stable for surgery.  I have re-reviewed the patient's chart and labs.  Questions were answered to the patient's satisfaction.     Jodi Marble

## 2017-04-02 NOTE — Progress Notes (Signed)
04/02/2017 7:58 PM  [name] [id number]  Post-Op Check   Temp:  [97.1 F (36.2 C)-98.6 F (37 C)] 97.1 F (36.2 C) (03/20 1946) Pulse Rate:  [72-104] 104 (03/20 1946) Resp:  [12-25] 18 (03/20 1946) BP: (102-132)/(65-82) 102/65 (03/20 1946) SpO2:  [96 %-100 %] 96 % (03/20 1946) Weight:  [92.5 kg (204 lb)] 92.5 kg (204 lb) (03/20 0743),     Intake/Output Summary (Last 24 hours) at 04/02/2017 1958 Last data filed at 04/02/2017 1946 Gross per 24 hour  Intake 2833.91 ml  Output 2885 ml  Net -51.09 ml    No results found for this or any previous visit (from the past 24 hour(s)).  SUBJECTIVE:  Min pain.  Breathing well.  Voiding.  Taking liquids and solids  OBJECTIVE:  Perhaps  Very sl R ramus weakness.    Wound flat. Drain functional.  IMPRESSION:  Satisfactory check.  PLAN:   Drain out in AM and discharge home.    Jasmine Martinez

## 2017-04-02 NOTE — Anesthesia Procedure Notes (Signed)
Procedure Name: Intubation Date/Time: 04/02/2017 8:56 AM Performed by: Willa Frater, CRNA Pre-anesthesia Checklist: Patient identified, Emergency Drugs available, Suction available and Patient being monitored Patient Re-evaluated:Patient Re-evaluated prior to induction Oxygen Delivery Method: Circle system utilized Preoxygenation: Pre-oxygenation with 100% oxygen Induction Type: IV induction Ventilation: Mask ventilation without difficulty Laryngoscope Size: Glidescope Grade View: Grade I Tube type: Oral Number of attempts: 1 Airway Equipment and Method: Stylet and Oral airway Placement Confirmation: ETT inserted through vocal cords under direct vision,  positive ETCO2 and breath sounds checked- equal and bilateral Secured at: 23 cm Tube secured with: Tape Dental Injury: Teeth and Oropharynx as per pre-operative assessment  Difficulty Due To: Difficulty was anticipated

## 2017-04-02 NOTE — Anesthesia Postprocedure Evaluation (Signed)
Anesthesia Post Note  Patient: Jasmine Martinez  Procedure(s) Performed: RIGHT SUPERFICIAL PAROTIDECTOMY (Right Neck)     Patient location during evaluation: PACU Anesthesia Type: General Level of consciousness: sedated and patient cooperative Pain management: pain level controlled Vital Signs Assessment: post-procedure vital signs reviewed and stable Respiratory status: spontaneous breathing Cardiovascular status: stable Anesthetic complications: no    Last Vitals:  Vitals:   04/02/17 1115 04/02/17 1131  BP: 124/73 132/71  Pulse: 93 98  Resp: 12 (!) 25  Temp:    SpO2: 100% 100%    Last Pain:  Vitals:   04/02/17 1131  TempSrc:   PainSc: [place]

## 2017-04-02 NOTE — Anesthesia Preprocedure Evaluation (Signed)
Anesthesia Evaluation  Patient identified by MRN, date of birth, ID band Patient awake    Reviewed: Allergy & Precautions, H&P , NPO status , Patient's Chart, lab work & pertinent test results  Airway Mallampati: III  TM Distance: >3 FB Neck ROM: full    Dental no notable dental hx. (+) Teeth Intact, Dental Advisory Given   Pulmonary neg pulmonary ROS, sleep apnea ,    Pulmonary exam normal breath sounds clear to auscultation       Cardiovascular Exercise Tolerance: Good hypertension, negative cardio ROS Normal cardiovascular exam Rhythm:regular Rate:Normal     Neuro/Psych negative neurological ROS  negative psych ROS   GI/Hepatic negative GI ROS, Neg liver ROS,   Endo/Other  negative endocrine ROSdiabetes, Well Controlled, Type 2, Oral Hypoglycemic AgentsMorbid obesity  Renal/GU negative Renal ROS  negative genitourinary   Musculoskeletal   Abdominal (+) + obese,   Peds  Hematology negative hematology ROS (+)   Anesthesia Other Findings   Reproductive/Obstetrics negative OB ROS                             Anesthesia Physical  Anesthesia Plan  ASA: III  Anesthesia Plan: General   Post-op Pain Management:    Induction: Intravenous  PONV Risk Score and Plan: 4 or greater and Ondansetron, Dexamethasone, Midazolam and Scopolamine patch - Pre-op  Airway Management Planned: Oral ETT  Additional Equipment:   Intra-op Plan:   Post-operative Plan: Extubation in OR  Informed Consent: I have reviewed the patients History and Physical, chart, labs and discussed the procedure including the risks, benefits and alternatives for the proposed anesthesia with the patient or authorized representative who has indicated his/her understanding and acceptance.   Dental Advisory Given  Plan Discussed with: CRNA  Anesthesia Plan Comments:         Anesthesia Quick Evaluation

## 2017-04-02 NOTE — Op Note (Signed)
DATE  TIME    Patient Name  MRN   Pre-Op Dx: RIGHT parotid tail lesion  Post-op Dx: same  Proc: RIGHT superficial parotidectomy with facial n preservation   Surg:  Jodi Marble T MD  Asst:  Sallee Provencal PA  Anes:  GOT  EBL: min  Comp:  none  Findings:  Bluish 2 cm ovoid lesion in RIGHT parotid tail  Procedure:  With the patient in a comfortable supine position, general orotracheal anesthesia was induced without difficulty. The CT scan had been reviewed earlier.  The neck was examined. The identifying initial was noted. A routine surgical timeout was obtained.  The neck was extended and the patient placed in reverse Trendelenburg. The head was rotated to the left for access to the right neck. The proposed wound line was infiltrated with 1% Xylocaine with 1 100,000 epinephrine, 8 mL's total. Several minutes were allowed for this to take effect.  The NIMS monitor was placed in the standard fashion.  A  Hibiclens sterile preparation and draping of the right neck was performed.  A small parotidectomy incision was planned and marked. He was crosshatched appropriately. The wound was executed with a sharp scalpel followed by a Shaw thermal scalpel.the periparotid plane was dissected forward. The lobule was dissected backwards. Tie back sutures were used.  Working on a broad front from the external canal down to the sternomastoid muscle, dissection was carried downward. The parotid tail was worked off the sternomastoid muscle along with the identified lesion. The external canal was dissected down to the bony canal. The pointer was identified. Blunt dissection, the facial nerve was identified.  Working on the main trunk and following the most inferior branch the parotid gland was rolled away from the nerve. Subsequent superior branches were dissected and when the tumor had been fully elevated, the gland was amputated at approximately the mid level.   The facial nerve was stimulated  and all branches.  The wound was irrigated. A small amount of bipolar cautery was used for hemostasis.  A 10 French Silastic round drain was brought out through the neck and secured there with a 4-0 nylon stitch. It was laid into the parotid bed. It was placed on suction 1 appropriate.  The wound was closed with interrupted 4-0 chromic suture. A good cosmetic approximation was had. The skin was closed with Dermabond glue. The drain was in good position and functioning.  At this point the procedure was completed. The patient was returned to anesthesia, awakened, extubated, and transferred to recovery in stable condition.   Dispo:  PACU to [place]  Plan:  Ice, elevation, analgesia, 23 hour suction drainage.  Tyson Alias MD

## 2017-04-02 NOTE — Discharge Instructions (Signed)
Keep head elevated 3-4 nights OK to shower No need to clean wound or apply any ointment.  You may trim off loose edges of skin glue as they begin to elevate I will call you with the pathology report next week Recheck my office 2 weeks please, [phone] for an appointment Call for sudden swelling (possible bleeding), or gradual swelling with pain (Possible infection) No strenuous activity x 2 weeks.

## 2017-04-03 ENCOUNTER — Encounter (HOSPITAL_BASED_OUTPATIENT_CLINIC_OR_DEPARTMENT_OTHER): Payer: Self-pay | Admitting: Otolaryngology

## 2017-04-03 DIAGNOSIS — E119 Type 2 diabetes mellitus without complications: Secondary | ICD-10-CM | POA: Diagnosis not present

## 2017-04-03 DIAGNOSIS — G4730 Sleep apnea, unspecified: Secondary | ICD-10-CM | POA: Diagnosis not present

## 2017-04-03 DIAGNOSIS — Z85028 Personal history of other malignant neoplasm of stomach: Secondary | ICD-10-CM | POA: Diagnosis not present

## 2017-04-03 DIAGNOSIS — Z79899 Other long term (current) drug therapy: Secondary | ICD-10-CM | POA: Diagnosis not present

## 2017-04-03 DIAGNOSIS — Z6833 Body mass index (BMI) 33.0-33.9, adult: Secondary | ICD-10-CM | POA: Diagnosis not present

## 2017-04-03 DIAGNOSIS — K116 Mucocele of salivary gland: Secondary | ICD-10-CM | POA: Diagnosis not present

## 2017-04-03 DIAGNOSIS — I10 Essential (primary) hypertension: Secondary | ICD-10-CM | POA: Diagnosis not present

## 2017-04-03 DIAGNOSIS — Z7984 Long term (current) use of oral hypoglycemic drugs: Secondary | ICD-10-CM | POA: Diagnosis not present

## 2017-04-03 NOTE — Discharge Summary (Signed)
04/03/2017 6:30 AM  [name] [id number]  Post-Op Day 1 Discharge Note    Temp:  [97.1 F (36.2 C)-98.6 F (37 C)] 98.1 F (36.7 C) (03/20 2145) Pulse Rate:  [72-104] 83 (03/20 2145) Resp:  [12-25] 18 (03/20 2145) BP: (102-132)/(58-82) 107/58 (03/20 2145) SpO2:  [96 %-100 %] 97 % (03/20 2145) Weight:  [92.5 kg (204 lb)] 92.5 kg (204 lb) (03/20 0743),     Intake/Output Summary (Last 24 hours) at 04/03/2017 0630 Last data filed at 04/02/2017 2200 Gross per 24 hour  Intake 3073.91 ml  Output 3290 ml  Net -216.09 ml    No results found for this or any previous visit (from the past 24 hour(s)).  SUBJECTIVE:  Min pain.  Breathing well.   Taking po liquids and solids.  OBJECTIVE:  Sl wound swelling.  Drain out without difficulty.  IMPRESSION:  Satisfactory check  PLAN:  Discharge home.  Admit:  20 MAR Discharge 21 MAR Final Diagnosis:  RIGHT parotid neoplasm Proc:  RIGHT superficial parotidectomy with facial n. Preservation, 20 MAR Comp:  None Cond:  Ambulatory, pain controlled. Voiding  Taking good po Recheck:  2 weeks Rx:  Oxycodone Instructions written and given  Hosp Course:  Underwent surgery.  Observed overnight for drain management.  No problems or concerns.  Voiding.  Taking good po.  Min drainage from JP.  AM POD 1 drain removed and discharged to home and care of family.  Jasmine Martinez

## 2017-04-14 ENCOUNTER — Ambulatory Visit (INDEPENDENT_AMBULATORY_CARE_PROVIDER_SITE_OTHER): Payer: 59 | Admitting: Family Medicine

## 2017-04-14 VITALS — BP 115/74 | HR 78 | Temp 98.1°F | Ht 65.0 in | Wt 202.0 lb

## 2017-04-14 DIAGNOSIS — E559 Vitamin D deficiency, unspecified: Secondary | ICD-10-CM

## 2017-04-14 DIAGNOSIS — F418 Other specified anxiety disorders: Secondary | ICD-10-CM

## 2017-04-14 DIAGNOSIS — E119 Type 2 diabetes mellitus without complications: Secondary | ICD-10-CM | POA: Diagnosis not present

## 2017-04-14 DIAGNOSIS — E669 Obesity, unspecified: Secondary | ICD-10-CM | POA: Diagnosis not present

## 2017-04-14 DIAGNOSIS — E7849 Other hyperlipidemia: Secondary | ICD-10-CM

## 2017-04-14 DIAGNOSIS — Z6833 Body mass index (BMI) 33.0-33.9, adult: Secondary | ICD-10-CM

## 2017-04-14 DIAGNOSIS — Z9189 Other specified personal risk factors, not elsewhere classified: Secondary | ICD-10-CM | POA: Diagnosis not present

## 2017-04-14 MED ORDER — BUPROPION HCL ER (SR) 200 MG PO TB12: 200.0000 mg | ORAL_TABLET | Freq: Every day | ORAL | 0 refills | Status: DC

## 2017-04-14 MED ORDER — TRUE METRIX BLOOD GLUCOSE TEST VI STRP: 1.0000 | ORAL_STRIP | Freq: Every day | 0 refills | Status: DC

## 2017-04-14 MED ORDER — LIRAGLUTIDE -WEIGHT MANAGEMENT 18 MG/3ML ~~LOC~~ SOPN: 3.0000 mg | PEN_INJECTOR | Freq: Every day | SUBCUTANEOUS | 0 refills | Status: DC

## 2017-04-14 MED ORDER — NOVOFINE 32G X 6 MM MISC: 0 refills | Status: DC

## 2017-04-14 MED ORDER — VITAMIN D (ERGOCALCIFEROL) 1.25 MG (50000 UNIT) PO CAPS
50000.0000 [IU] | ORAL_CAPSULE | ORAL | 0 refills | Status: DC
Start: 2017-04-14 — End: 2017-05-26

## 2017-04-14 MED ORDER — TRUEPLUS LANCETS 30G MISC: 1.0000 | Freq: Every day | 0 refills | Status: DC

## 2017-04-14 MED ORDER — ATORVASTATIN CALCIUM 10 MG PO TABS: 10.0000 mg | ORAL_TABLET | Freq: Every day | ORAL | 0 refills | Status: DC

## 2017-04-14 NOTE — Progress Notes (Signed)
Office: [phone]  /  Fax: [phone]   HPI:   Chief Complaint: OBESITY Jasmine Martinez is here to discuss her progress with her obesity treatment plan. She is on the lower carbohydrate, vegetable and lean protein rich diet plan and is following her eating plan approximately 25 % of the time. She states she is exercising 0 minutes 0 times per week. Jasmine Martinez is recovering from right parotidectomy and retained a lot of fluid. She is starting to be able to eat soft protein foods. Overall she has done very well with managing her weight,while recovering from surgery. She denies hypoglycemia. Her weight is 202 lb (91.6 kg) today and has had a weight gain of 2 pounds over a period of 3 weeks since her last visit. She has lost 58 lbs since starting treatment with [place].  Vitamin D deficiency Jasmine Martinez has a diagnosis of vitamin D deficiency. She is stable on vit D and last level was at goal. Fatigue was improving before her recent surgery. Jasmine Martinez denies nausea, vomiting or muscle weakness.  Diabetes II Jasmine Martinez has a diagnosis of diabetes type II, very well controlled with diet and weight loss. Jasmine Martinez denies any hypoglycemic episodes.She has been working on intensive lifestyle modifications including diet, exercise, and weight loss to help control her blood glucose levels. She is tolerating Saxenda well.  Hyperlipidemia Halee has hyperlipidemia and is on lipitor. She is doing well on her diet prescription. Tashena has been trying to improve her cholesterol levels with intensive lifestyle modification including a low saturated fat diet, exercise and weight loss. She denies any chest pain or myalgias.  At risk for cardiovascular disease Raylea is at a higher than average risk for cardiovascular disease due to obesity, diabetes and hyperlipidemia. She currently denies any chest pain.  Depression with anxiety Jasmine Martinez mood is stable on wellbutrin. She uses xanax occasionally. She is doing much  better with controlling  Her emotional eating. She is still struggling with insomnia. Jasmine Martinez struggles with emotional eating and using food for comfort to the extent that it is negatively impacting her health. She often snacks when she is not hungry. Jasmine Martinez sometimes feels she is out of control and then feels guilty that she made poor food choices. She has been working on behavior modification techniques to help reduce her emotional eating and has been somewhat successful. She shows no sign of suicidal or homicidal ideations.  Depression screen [facility] 04/23/2016 01/02/2016 02/14/2015 06/23/2014  Decreased Interest 0 1 0 1  Down, Depressed, Hopeless 0 1 0 1  PHQ - 2 Score 0 2 0 2  Altered sleeping - 2 - 1  Tired, decreased energy - 3 - 1  Change in appetite - 3 - 0  Feeling bad or failure about yourself  - 3 - 0  Trouble concentrating - 0 - 0  Moving slowly or fidgety/restless - 0 - 0  Suicidal thoughts - 0 - 0  PHQ-9 Score - 13 - 4  Difficult doing work/chores - - - Not difficult at all      ALLERGIES: Allergies  Allergen Reactions   Codeine Shortness Of Breath   Hydrocodone Shortness Of Breath    Mild SOB    MEDICATIONS: Current Outpatient Medications on File Prior to Visit  Medication Sig Dispense Refill   ALPRAZolam (XANAX) 0.5 MG tablet Take 1 tablet (0.5 mg total) by mouth 2 (two) times daily as needed for anxiety. 30 tablet 0   glucose blood (ACCU-CHEK GUIDE) test strip Use as instructed 100  each 0   levonorgestrel (MIRENA) 20 MCG/24HR IUD 1 each by Intrauterine route once.     Melatonin 3 MG TABS Take by mouth.     No current facility-administered medications on file prior to visit.     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    Heartburn    HTN (hypertension)    no meds now   Hyperlipidemia    Obesity    Palpitations    Parotid mass    right   PMDD (premenstrual dysphoric disorder) 01/02/2016   Shortness of breath    Vitamin D deficiency    Vitamin D deficiency     PAST SURGICAL HISTORY: Past Surgical History:  Procedure Laterality Date   APPENDECTOMY     CHOLECYSTECTOMY     EUS N/A 03/23/2014   Procedure: ESOPHAGEAL ENDOSCOPIC ULTRASOUND (EUS) RADIAL;  Surgeon: Arta Silence, MD;  Location: [facility];  Service: Endoscopy;  Laterality: N/A;   FINE NEEDLE ASPIRATION N/A 03/23/2014   Procedure: FINE NEEDLE ASPIRATION (FNA) LINEAR;  Surgeon: Arta Silence, MD;  Location: [facility];  Service: Endoscopy;  Laterality: N/A;   PAROTIDECTOMY Right 04/02/2017   Procedure: RIGHT SUPERFICIAL PAROTIDECTOMY;  Surgeon: Jodi Marble, MD;  Location: [place];  Service: ENT;  Laterality: Right;   TONSILLECTOMY      SOCIAL HISTORY: Social History   Tobacco Use   Smoking status: Never Smoker   Smokeless tobacco: Never Used  Substance Use Topics   Alcohol use: No   Drug use: No    Frequency: 3.0 times per week    FAMILY HISTORY: Family History  Problem Relation Age of Onset   Healthy Mother    Hypertension Mother    Hyperlipidemia Mother    Alcoholism Mother    Anxiety disorder Mother    Drug abuse Mother    Healthy Father    Colon cancer Paternal Grandfather 74       colon    ROS: Review of Systems  Constitutional: Positive for malaise/fatigue. Negative for weight loss.  Cardiovascular: Negative for chest pain.  Gastrointestinal: Negative for nausea and vomiting.  Musculoskeletal: Negative for myalgias.       Negative for muscle weakness  Endo/Heme/Allergies:       Negative for hypoglycemia  Psychiatric/Behavioral: Positive for depression. Negative for suicidal ideas. The patient has insomnia.     PHYSICAL EXAM: Blood pressure 115/74, pulse 78,  temperature 98.1 F (36.7 C), temperature source Oral, height 5\' 5"  (1.651 m), weight 202 lb (91.6 kg), SpO2 100 %. Body mass index is 33.61 kg/m. Physical Exam  Constitutional: She is oriented to person, place, and time. She appears well-developed and well-nourished.  Cardiovascular: Normal rate.  Pulmonary/Chest: Effort normal.  Musculoskeletal: Normal range of motion.  Neurological: She is oriented to person, place, and time.  Skin: Skin is warm and dry.  Psychiatric: She has a normal mood and affect. Her behavior is normal.  Vitals reviewed.   RECENT LABS AND TESTS: BMET    Component Value Date/Time   NA 140 03/31/2017 0945   NA 141 09/25/2016 0811   K 4.4 03/31/2017 0945   CL 109 03/31/2017 0945   CO2 19 (  L) 03/31/2017 0945   GLUCOSE 102 (H) 03/31/2017 0945   BUN 17 03/31/2017 0945   BUN 11 09/25/2016 0811   CREATININE 0.64 03/31/2017 0945   CREATININE 0.68 02/27/2017 1601   CALCIUM 9.2 03/31/2017 0945   GFRNONAA >60 03/31/2017 0945   GFRNONAA 106 02/27/2017 1601   GFRAA >60 03/31/2017 0945   GFRAA 123 02/27/2017 1601   Lab Results  Component Value Date   HGBA1C 4.7 (L) 01/20/2017   HGBA1C 4.9 09/25/2016   HGBA1C 5.5 04/18/2016   HGBA1C 6.3 (H) 01/02/2016   HGBA1C WILL FOLLOW 01/02/2016   Lab Results  Component Value Date   INSULIN 11.0 01/20/2017   INSULIN 11.7 09/25/2016   INSULIN 21.8 04/18/2016   INSULIN 15.3 01/02/2016   INSULIN WILL FOLLOW 01/02/2016   CBC    Component Value Date/Time   WBC 10.0 03/20/2017 0930   WBC 6.8 02/27/2017 1601   RBC 4.50 03/20/2017 0930   RBC 4.08 02/27/2017 1601   HGB 13.9 03/20/2017 0930   HCT 42.2 03/20/2017 0930   PLT 286 02/27/2017 1601   PLT 392 (H) 04/18/2016 1201   MCV 94 03/20/2017 0930   MCH 30.9 03/20/2017 0930   MCH 31.1 02/27/2017 1601   MCHC 32.9 03/20/2017 0930   MCHC 34.8 02/27/2017 1601   RDW 13.2 03/20/2017 0930   LYMPHSABS 1.7 03/20/2017 0930   MONOABS 0.6 05/05/2014 0902   EOSABS 0.1  03/20/2017 0930   BASOSABS 0.0 03/20/2017 0930   Iron/TIBC/Ferritin/ %Sat No results found for: IRON, TIBC, FERRITIN, IRONPCTSAT Lipid Panel     Component Value Date/Time   CHOL 143 01/20/2017 0924   TRIG 68 01/20/2017 0924   HDL 37 (L) 01/20/2017 0924   CHOLHDL 3.4 05/05/2014 0902   VLDL 16 05/05/2014 0902   LDLCALC 92 01/20/2017 0924   Hepatic Function Panel     Component Value Date/Time   PROT 6.8 02/27/2017 1601   PROT 7.5 09/25/2016 0811   ALBUMIN 4.5 09/25/2016 0811   AST 12 02/27/2017 1601   ALT 20 02/27/2017 1601   ALKPHOS 105 09/25/2016 0811   BILITOT 0.3 02/27/2017 1601   BILITOT 0.7 09/25/2016 0811      Component Value Date/Time   TSH 3.180 03/20/2017 0930   TSH 3.260 09/25/2016 0811   TSH 2.090 04/18/2016 1201   Results for Jasmine Martinez, Jasmine Martinez (MRN [id number]) as of 04/14/2017 17:23  Ref. Range 03/20/2017 09:30  Vitamin D, 25-Hydroxy Latest Ref Range: 30.0 - 100.0 ng/mL 30.7   ASSESSMENT AND PLAN: Other hyperlipidemia - Plan: atorvastatin (LIPITOR) 10 MG tablet  Vitamin D deficiency - Plan: Vitamin D, Ergocalciferol, (DRISDOL) 50000 units CAPS capsule  Type 2 diabetes mellitus without complication, without long-term current use of insulin (HCC) - Plan: NOVOFINE 32G X 6 MM MISC, TRUE METRIX BLOOD GLUCOSE TEST test strip, TRUEPLUS LANCETS 30G MISC  Depression with anxiety - Plan: buPROPion (WELLBUTRIN SR) 200 MG 12 hr tablet  At risk for heart disease  Class 1 obesity with serious comorbidity and body mass index (BMI) of 33.0 to 33.9 in adult, unspecified obesity type - Plan: Liraglutide -Weight Management (SAXENDA) 18 MG/3ML SOPN  PLAN:  Vitamin D Deficiency Jasmine Martinez was informed that low vitamin D levels contributes to fatigue and are associated with obesity, breast, and colon cancer. She agrees to continue to take prescription Vit D @50 ,000 IU every three days #10 with no refills and will follow up for routine testing of vitamin D, at least 2-3 times per  year. She  was informed of the risk of over-replacement of vitamin D and agrees to not increase her dose unless she discusses this with [place] first. Jasmine Martinez agrees to follow up with our clinic in 2 to 3 weeks.  Diabetes II Jasmine Martinez has been given extensive diabetes education by myself today including ideal fasting and post-prandial blood glucose readings, individual ideal Hgb A1c goals  and hypoglycemia prevention. We discussed the importance of good blood sugar control to decrease the likelihood of diabetic complications such as nephropathy, neuropathy, limb loss, blindness, coronary artery disease, and death. We discussed the importance of intensive lifestyle modification including diet, exercise and weight loss as the first line treatment for diabetes. She agrees to continue with diet, exercise and weight loss. We will refill needles, strips and lancets for 1 month and Jasmine Martinez agreed to follow up at the agreed upon time.  Hyperlipidemia Micca was informed of the American Heart Association Guidelines emphasizing intensive lifestyle modifications as the first line treatment for hyperlipidemia. We discussed many lifestyle modifications today in depth, and Rosiland will continue to work on decreasing saturated fats such as fatty red meat, butter and many fried foods. She will also increase vegetables and lean protein in her diet and continue to work on exercise and weight loss efforts. Jasmine Martinez agrees to continue lipitor 10 mg qd #30 with no refills and follow up as directed.  Cardiovascular risk counseling Jasmine Martinez was given extended (15 minutes) coronary artery disease prevention counseling today. She is 47 y.o. female and has risk factors for heart disease including obesity, diabetes and hyperlipidemia`. We discussed intensive lifestyle modifications today with an emphasis on specific weight loss instructions and strategies. Pt was also informed of the importance of increasing exercise and decreasing  saturated fats to help prevent heart disease.  Depression with anxiety We discussed behavior modification techniques today to help Jasmine Martinez deal with her emotional eating and depression. She has agreed to continue Wellbutrin SR 200 mg qd #30 with no refills and follow up as directed.  Obesity Jasmine Martinez is currently in the action stage of change. As such, her goal is to continue with weight loss efforts She has agreed to follow a lower carbohydrate, vegetable and lean protein rich diet plan Jasmine Martinez has been instructed to work up to a goal of 150 minutes of combined cardio and strengthening exercise per week for weight loss and overall health benefits. We discussed the following Behavioral Modification Strategies today: better snacking choices, increasing lean protein intake, decreasing simple carbohydrates , work on meal planning and easy cooking plans and ways to avoid night time snacking  We discussed various medication options to help Jasmine Martinez with her weight loss efforts and we both agreed to continue Saxenda 3.0 mg qd # 5 pens with no refills.  Jasmine Martinez has agreed to follow up with our clinic in 2 to 3 weeks. She was informed of the importance of frequent follow up visits to maximize her success with intensive lifestyle modifications for her multiple health conditions.   I, Doreene Nest, am acting as transcriptionist for Dennard Nip, MD  I have reviewed the above documentation for accuracy and completeness, and I agree with the above. -Dennard Nip, MD

## 2017-04-15 DIAGNOSIS — F411 Generalized anxiety disorder: Secondary | ICD-10-CM | POA: Diagnosis not present

## 2017-04-17 MED FILL — ATORVASTATIN 10 MG TABLET: 10 | 30 days supply | Qty: 30 | Fill #0

## 2017-04-17 MED FILL — NOVOFINE 32G NEEDLES: 32G X 6 MM | 90 days supply | Qty: 100 | Fill #0

## 2017-04-17 MED FILL — VIT D2 1.25 MG (50,000 UNIT: 1.25 MG | 30 days supply | Qty: 10 | Fill #0

## 2017-04-17 MED FILL — BUPROPION HCL SR 200 MG TAB: 200 | 30 days supply | Qty: 30 | Fill #0

## 2017-04-19 MED FILL — ACCU-CHEK FASTCLIX LANCETS: 90 days supply | Qty: 102 | Fill #0

## 2017-04-22 DIAGNOSIS — F411 Generalized anxiety disorder: Secondary | ICD-10-CM | POA: Diagnosis not present

## 2017-04-23 DIAGNOSIS — Z30433 Encounter for removal and reinsertion of intrauterine contraceptive device: Secondary | ICD-10-CM | POA: Diagnosis not present

## 2017-04-23 DIAGNOSIS — Z3202 Encounter for pregnancy test, result negative: Secondary | ICD-10-CM | POA: Diagnosis not present

## 2017-04-24 MED FILL — SAXENDA 18 MG/3 ML PEN: 18 | 30 days supply | Qty: 15 | Fill #0

## 2017-04-26 LAB — HM DIABETES EYE EXAM

## 2017-04-28 ENCOUNTER — Encounter: Payer: Self-pay | Admitting: Internal Medicine

## 2017-04-29 DIAGNOSIS — F411 Generalized anxiety disorder: Secondary | ICD-10-CM | POA: Diagnosis not present

## 2017-05-05 ENCOUNTER — Ambulatory Visit (INDEPENDENT_AMBULATORY_CARE_PROVIDER_SITE_OTHER): Payer: 59 | Admitting: Family Medicine

## 2017-05-05 VITALS — BP 104/64 | HR 71 | Temp 97.9°F | Ht 65.0 in | Wt 200.0 lb

## 2017-05-05 DIAGNOSIS — Z9189 Other specified personal risk factors, not elsewhere classified: Secondary | ICD-10-CM | POA: Diagnosis not present

## 2017-05-05 DIAGNOSIS — F3289 Other specified depressive episodes: Secondary | ICD-10-CM

## 2017-05-05 DIAGNOSIS — E669 Obesity, unspecified: Secondary | ICD-10-CM

## 2017-05-05 DIAGNOSIS — Z6833 Body mass index (BMI) 33.0-33.9, adult: Secondary | ICD-10-CM | POA: Diagnosis not present

## 2017-05-05 DIAGNOSIS — E119 Type 2 diabetes mellitus without complications: Secondary | ICD-10-CM

## 2017-05-05 MED ORDER — BUPROPION HCL ER (SR) 200 MG PO TB12: 200.0000 mg | ORAL_TABLET | Freq: Every day | ORAL | 0 refills | Status: DC

## 2017-05-05 MED ORDER — LIRAGLUTIDE -WEIGHT MANAGEMENT 18 MG/3ML ~~LOC~~ SOPN: 3.0000 mg | PEN_INJECTOR | Freq: Every day | SUBCUTANEOUS | 0 refills | Status: DC

## 2017-05-05 NOTE — Progress Notes (Signed)
Office: [phone]  /  Fax: [phone]   HPI:   Chief Complaint: OBESITY Jasmine [facility] is here to discuss her progress with her obesity treatment plan. She is on the lower carbohydrate, vegetable and lean protein rich diet plan and is following her eating plan approximately 75 % of the time. She states she is exercising 0 minutes 0 times per week. Jasmine [facility] continues to do very well with weight loss, even over the Easter Holiday. She is still struggling with some family sabotage and work stress. She is doing very well on Saxenda and she denies nausea, vomiting or hypoglycemia. Her weight is 200 lb (90.7 kg) today and has had a weight loss of 2 pounds over a period of 3 weeks since her last visit. She has lost 60 lbs since starting treatment with [place].  Diabetes II Jasmine [facility] has a diagnosis of diabetes type II. She is doing very well with her diabetic diet and weight loss efforts. Jasmine [facility] denies any hypoglycemic episodes. Last A1c was very well controlled at 4.7 She has been working on intensive lifestyle modifications including diet, exercise, and weight loss to help control her blood glucose levels.  At risk for cardiovascular disease Jasmine Martinez is at a higher than average risk for cardiovascular disease due to obesity and diabetes. She currently denies any chest pain.  Depression with emotional eating behaviors Jasmine Martinez mood is stable on wellbutrin and she is doing much better with recognizing emotional eating. Jasmine Martinez struggles with emotional eating and using food for comfort to the extent that it is negatively impacting her health. She often snacks when she is not hungry. Jasmine Martinez sometimes feels she is out of control and then feels guilty that she made poor food choices. She has been working on behavior modification techniques to help reduce her emotional eating and has been somewhat successful. Her blood pressure is stable and she denies insomnia. She shows no sign of suicidal or homicidal  ideations.  Depression screen Jasmine [facility] 2/9 04/23/2016 01/02/2016 02/14/2015 06/23/2014  Decreased Interest 0 1 0 1  Down, Depressed, Hopeless 0 1 0 1  PHQ - 2 Score 0 2 0 2  Altered sleeping - 2 - 1  Tired, decreased energy - 3 - 1  Change in appetite - 3 - 0  Feeling bad or failure about yourself  - 3 - 0  Trouble concentrating - 0 - 0  Moving slowly or fidgety/restless - 0 - 0  Suicidal thoughts - 0 - 0  PHQ-9 Score - 13 - 4  Difficult doing work/chores - - - Not difficult at all     ALLERGIES: Allergies  Allergen Reactions  . Codeine Shortness Of Breath  . Hydrocodone Shortness Of Breath    Mild SOB    MEDICATIONS: Current Outpatient Medications on File Prior to Visit  Medication Sig Dispense Refill  . ALPRAZolam (XANAX) 0.5 MG tablet Take 1 tablet (0.5 mg total) by mouth 2 (two) times daily as needed for anxiety. 30 tablet 0  . atorvastatin (LIPITOR) 10 MG tablet Take 1 tablet (10 mg total) by mouth daily. 30 tablet 0  . glucose blood (ACCU-CHEK GUIDE) test strip Use as instructed 100 each 0  . levonorgestrel (MIRENA) 20 MCG/24HR IUD 1 each by Intrauterine route once.    . Melatonin 3 MG TABS Take by mouth.    Marland Kitchen NOVOFINE 32G X 6 MM MISC INJECT AS DIRECTED once DAILY. 100 each 0  . TRUE METRIX BLOOD GLUCOSE TEST test strip 1 each by Other route daily.  1 stick daily 100 each 0  . TRUEPLUS LANCETS 30G MISC Inject 1 Stick as directed daily. 100 each 0  . Vitamin D, Ergocalciferol, (DRISDOL) 50000 units CAPS capsule Take 1 capsule (50,000 Units total) by mouth every 3 (three) days. 10 capsule 0   No current facility-administered medications on file prior to visit.     PAST MEDICAL HISTORY: Past Medical History:  Diagnosis Date  . Anxiety   . Back pain   . Chest pain   . Depression   . Diabetes mellitus   . Diarrhea   . Edema    feet and legs  . Gallbladder problem   . 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 Heartburn   . HTN (hypertension)    no  meds now  . Hyperlipidemia   . Obesity   . Palpitations   . Parotid mass    right  . PMDD (premenstrual dysphoric disorder) 01/02/2016  . Shortness of breath   . Vitamin D deficiency   . Vitamin D deficiency     PAST SURGICAL HISTORY: Past Surgical History:  Procedure Laterality Date  . APPENDECTOMY    . CHOLECYSTECTOMY    . EUS N/A 03/23/2014   Procedure: ESOPHAGEAL ENDOSCOPIC ULTRASOUND (EUS) RADIAL;  Surgeon: Arta Silence, MD;  Location: [facility];  Service: Endoscopy;  Laterality: N/A;  . FINE NEEDLE ASPIRATION N/A 03/23/2014   Procedure: FINE NEEDLE ASPIRATION (FNA) LINEAR;  Surgeon: Arta Silence, MD;  Location: [facility];  Service: Endoscopy;  Laterality: N/A;  . PAROTIDECTOMY Right 04/02/2017   Procedure: RIGHT SUPERFICIAL PAROTIDECTOMY;  Surgeon: Jodi Marble, MD;  Location: [facility];  Service: ENT;  Laterality: Right;  . TONSILLECTOMY      SOCIAL HISTORY: Social History   Tobacco Use  . Smoking status: Never Smoker  . Smokeless tobacco: Never Used  Substance Use Topics  . Alcohol use: No  . Drug use: No    Frequency: 3.0 times per week    FAMILY HISTORY: Family History  Problem Relation Age of Onset  . Healthy Mother   . Hypertension Mother   . Hyperlipidemia Mother   . Alcoholism Mother   . Anxiety disorder Mother   . Drug abuse Mother   . Healthy Father   . Colon cancer Paternal Grandfather 56       colon    ROS: Review of Systems  Constitutional: Positive for weight loss.  Cardiovascular: Negative for chest pain.  Gastrointestinal: Negative for nausea and vomiting.  Endo/Heme/Allergies:       Negative for hypoglycemia  Psychiatric/Behavioral: Positive for depression. Negative for suicidal ideas. The patient does not have insomnia.     PHYSICAL EXAM: Blood pressure 104/64, pulse 71, temperature 97.9 F (36.6 C), temperature source Oral, height 5\' 5"  (1.651 m), weight 200 lb (90.7 kg), SpO2 100 %. Body mass index is  33.28 kg/m. Physical Exam  Constitutional: She is oriented to person, place, and time. She appears well-developed and well-nourished.  Cardiovascular: Normal rate.  Pulmonary/Chest: Effort normal.  Musculoskeletal: Normal range of motion.  Neurological: She is oriented to person, place, and time.  Skin: Skin is warm and dry.  Psychiatric: She has a normal mood and affect. Her behavior is normal.  Vitals reviewed.   RECENT LABS AND TESTS: BMET    Component Value Date/Time   NA 140 03/31/2017 0945   NA 141 09/25/2016 0811   K 4.4 03/31/2017 0945   CL 109 03/31/2017 0945  CO2 19 (L) 03/31/2017 0945   GLUCOSE 102 (H) 03/31/2017 0945   BUN 17 03/31/2017 0945   BUN 11 09/25/2016 0811   CREATININE 0.64 03/31/2017 0945   CREATININE 0.68 02/27/2017 1601   CALCIUM 9.2 03/31/2017 0945   GFRNONAA >60 03/31/2017 0945   GFRNONAA 106 02/27/2017 1601   GFRAA >60 03/31/2017 0945   GFRAA 123 02/27/2017 1601   Lab Results  Component Value Date   HGBA1C 4.7 (L) 01/20/2017   HGBA1C 4.9 09/25/2016   HGBA1C 5.5 04/18/2016   HGBA1C 6.3 (H) 01/02/2016   HGBA1C WILL FOLLOW 01/02/2016   Lab Results  Component Value Date   INSULIN 11.0 01/20/2017   INSULIN 11.7 09/25/2016   INSULIN 21.8 04/18/2016   INSULIN 15.3 01/02/2016   INSULIN WILL FOLLOW 01/02/2016   CBC    Component Value Date/Time   WBC 10.0 03/20/2017 0930   WBC 6.8 02/27/2017 1601   RBC 4.50 03/20/2017 0930   RBC 4.08 02/27/2017 1601   HGB 13.9 03/20/2017 0930   HCT 42.2 03/20/2017 0930   PLT 286 02/27/2017 1601   PLT 392 (H) 04/18/2016 1201   MCV 94 03/20/2017 0930   MCH 30.9 03/20/2017 0930   MCH 31.1 02/27/2017 1601   MCHC 32.9 03/20/2017 0930   MCHC 34.8 02/27/2017 1601   RDW 13.2 03/20/2017 0930   LYMPHSABS 1.7 03/20/2017 0930   MONOABS 0.6 05/05/2014 0902   EOSABS 0.1 03/20/2017 0930   BASOSABS 0.0 03/20/2017 0930   Iron/TIBC/Ferritin/ %Sat No results found for: IRON, TIBC, FERRITIN, IRONPCTSAT Lipid  Panel     Component Value Date/Time   CHOL 143 01/20/2017 0924   TRIG 68 01/20/2017 0924   HDL 37 (L) 01/20/2017 0924   CHOLHDL 3.4 05/05/2014 0902   VLDL 16 05/05/2014 0902   LDLCALC 92 01/20/2017 0924   Hepatic Function Panel     Component Value Date/Time   PROT 6.8 02/27/2017 1601   PROT 7.5 09/25/2016 0811   ALBUMIN 4.5 09/25/2016 0811   AST 12 02/27/2017 1601   ALT 20 02/27/2017 1601   ALKPHOS 105 09/25/2016 0811   BILITOT 0.3 02/27/2017 1601   BILITOT 0.7 09/25/2016 0811      Component Value Date/Time   TSH 3.180 03/20/2017 0930   TSH 3.260 09/25/2016 0811   TSH 2.090 04/18/2016 1201   Results for JESSA, STINSON (MRN [id number]) as of 05/05/2017 17:32  Ref. Range 03/20/2017 09:30  Vitamin D, 25-Hydroxy Latest Ref Range: 30.0 - 100.0 ng/mL 30.7   ASSESSMENT AND PLAN: Other depression - with emotional eating  - Plan: buPROPion (WELLBUTRIN SR) 200 MG 12 hr tablet  Type 2 diabetes mellitus without complication, without long-term current use of insulin (HCC)  At risk for heart disease  Class 1 obesity with serious comorbidity and body mass index (BMI) of 33.0 to 33.9 in adult, unspecified obesity type - Plan: Liraglutide -Weight Management ([place]) 18 MG/3ML SOPN  PLAN:  Diabetes II Avo has been given extensive diabetes education by myself today including ideal fasting and post-prandial blood glucose readings, individual ideal Hgb A1c goals and hypoglycemia prevention. We discussed the importance of good blood sugar control to decrease the likelihood of diabetic complications such as nephropathy, neuropathy, limb loss, blindness, coronary artery disease, and death. We discussed the importance of intensive lifestyle modification including diet, exercise and weight loss as the first line treatment for diabetes. Grayce agrees to continue [place] and diet prescription and we will continue to monitor. Renesme agreed to follow up  at the agreed upon  time.  Cardiovascular risk counseling Raylee was given extended (15 minutes) coronary artery disease prevention counseling today. She is 47 y.o. female and has risk factors for heart disease including obesity and diabetes. We discussed intensive lifestyle modifications today with an emphasis on specific weight loss instructions and strategies. Pt was also informed of the importance of increasing exercise and decreasing saturated fats to help prevent heart disease.  Depression with Emotional Eating Behaviors We discussed behavior modification techniques today to help Kateline deal with her emotional eating and depression. She has agreed to take Wellbutrin SR 200 mg qd #30 with no refills and follow up as directed.  Obesity Jolissa is currently in the action stage of change. As such, her goal is to continue with weight loss efforts She has agreed to follow a lower carbohydrate, vegetable and lean protein rich diet plan Shaketta has been instructed to work up to a goal of 150 minutes of combined cardio and strengthening exercise per week for weight loss and overall health benefits. We discussed the following Behavioral Modification Strategies today: increasing lean protein intake and decreasing simple carbohydrates  We discussed various medication options to help Adalae with her weight loss efforts and we both agreed to continue Saxenda. We will refill for 1 month.  Braelee has agreed to follow up with our clinic in 2 to 3 weeks. She was informed of the importance of frequent follow up visits to maximize her success with intensive lifestyle modifications for her multiple health conditions.   I, Jasmine [facility], am acting as transcriptionist for Jasmine Nip, MD  I have reviewed the above documentation for accuracy and completeness, and I agree with the above. -Jasmine Nip, MD

## 2017-05-08 ENCOUNTER — Telehealth: Payer: 59 | Admitting: Physician Assistant

## 2017-05-08 DIAGNOSIS — R300 Dysuria: Secondary | ICD-10-CM | POA: Diagnosis not present

## 2017-05-08 MED ORDER — CEPHALEXIN 500 MG PO CAPS: 500.0000 mg | ORAL_CAPSULE | Freq: Two times a day (BID) | ORAL | 0 refills | Status: DC

## 2017-05-08 MED FILL — CEPHALEXIN 500 MG CAPSULE: 500 | 7 days supply | Qty: 14 | Fill #0

## 2017-05-08 NOTE — Progress Notes (Signed)

## 2017-05-13 DIAGNOSIS — F411 Generalized anxiety disorder: Secondary | ICD-10-CM | POA: Diagnosis not present

## 2017-05-20 DIAGNOSIS — F411 Generalized anxiety disorder: Secondary | ICD-10-CM | POA: Diagnosis not present

## 2017-05-26 ENCOUNTER — Ambulatory Visit (INDEPENDENT_AMBULATORY_CARE_PROVIDER_SITE_OTHER): Payer: 59 | Admitting: Family Medicine

## 2017-05-26 VITALS — BP 103/68 | HR 76 | Temp 98.0°F | Ht 65.0 in | Wt 198.0 lb

## 2017-05-26 DIAGNOSIS — Z6833 Body mass index (BMI) 33.0-33.9, adult: Secondary | ICD-10-CM

## 2017-05-26 DIAGNOSIS — E7849 Other hyperlipidemia: Secondary | ICD-10-CM

## 2017-05-26 DIAGNOSIS — E669 Obesity, unspecified: Secondary | ICD-10-CM

## 2017-05-26 DIAGNOSIS — Z9189 Other specified personal risk factors, not elsewhere classified: Secondary | ICD-10-CM

## 2017-05-26 DIAGNOSIS — F3289 Other specified depressive episodes: Secondary | ICD-10-CM

## 2017-05-26 DIAGNOSIS — E559 Vitamin D deficiency, unspecified: Secondary | ICD-10-CM | POA: Diagnosis not present

## 2017-05-26 MED ORDER — VITAMIN D (ERGOCALCIFEROL) 1.25 MG (50000 UNIT) PO CAPS: 50000.0000 [IU] | ORAL_CAPSULE | ORAL | 0 refills | Status: DC

## 2017-05-26 MED ORDER — LIRAGLUTIDE -WEIGHT MANAGEMENT 18 MG/3ML ~~LOC~~ SOPN: 3.0000 mg | PEN_INJECTOR | Freq: Every day | SUBCUTANEOUS | 0 refills | Status: DC

## 2017-05-26 MED ORDER — BUPROPION HCL ER (SR) 200 MG PO TB12: 200.0000 mg | ORAL_TABLET | Freq: Every day | ORAL | 0 refills | Status: DC

## 2017-05-26 MED ORDER — ATORVASTATIN CALCIUM 10 MG PO TABS: 10.0000 mg | ORAL_TABLET | Freq: Every day | ORAL | 0 refills | Status: DC

## 2017-05-26 MED FILL — BUPROPION HCL SR 200 MG TAB: 200 | 30 days supply | Qty: 30 | Fill #0

## 2017-05-26 MED FILL — ATORVASTATIN 10 MG TABLET: 10 | 30 days supply | Qty: 30 | Fill #0

## 2017-05-26 MED FILL — VIT D2 1.25 MG (50,000 UNIT: 1.25 MG | 30 days supply | Qty: 10 | Fill #0

## 2017-05-26 MED FILL — SAXENDA 18 MG/3 ML PEN: 18 | 30 days supply | Qty: 15 | Fill #0

## 2017-05-26 NOTE — Progress Notes (Signed)
Office: [phone]  /  Fax: [phone]   HPI:   Chief Complaint: OBESITY Jasmine [facility] is here to discuss her progress with her obesity treatment plan. She is on the lower carbohydrate, vegetable and lean protein rich diet plan and is following her eating plan approximately 75 % of the time. She states she is exercising 0 minutes 0 times per week. Arabel continues to do well with Saxenda. She has had extra stress with her son's education and her husband's dialysis and notes some increased emotional eating. She has felt more deprived lately. Her weight is 198 lb (89.8 kg) today and has had a weight loss of 2 pounds over a period of 3 weeks since her last visit. She has lost 62 lbs since starting treatment with [place].  Vitamin D deficiency Jasmine [facility] has a diagnosis of vitamin D deficiency. She is stable on vit D and denies nausea, vomiting or muscle weakness. Fatigue is improved.  Hyperlipidemia Jasmine [facility] has hyperlipidemia and is stable on Lipitor. She has been trying to improve her cholesterol levels with intensive lifestyle modification including a low saturated fat diet, exercise and weight loss. She denies any chest pain or myalgias.  At risk for cardiovascular disease Jasmine [facility] is at a higher than average risk for cardiovascular disease due to obesity and hyperlipidemia. She currently denies any chest pain.  Depression with emotional eating behaviors Jasmine Martinez notes increased stress at home. She is seeing her counselor weekly. She has some increased emotional eating. Jasmine [facility] struggles with emotional eating and using food for comfort to the extent that it is negatively impacting her health. She often snacks when she is not hungry. Jasmine [facility] sometimes feels she is out of control and then feels guilty that she made poor food choices. She has been working on behavior modification techniques to help reduce her emotional eating and has been somewhat successful. She shows no sign of suicidal or  homicidal ideations.  Depression screen [facility] 2/9 04/23/2016 01/02/2016 02/14/2015 06/23/2014  Decreased Interest 0 1 0 1  Down, Depressed, Hopeless 0 1 0 1  PHQ - 2 Score 0 2 0 2  Altered sleeping - 2 - 1  Tired, decreased energy - 3 - 1  Change in appetite - 3 - 0  Feeling bad or failure about yourself  - 3 - 0  Trouble concentrating - 0 - 0  Moving slowly or fidgety/restless - 0 - 0  Suicidal thoughts - 0 - 0  PHQ-9 Score - 13 - 4  Difficult doing work/chores - - - Not difficult at all     ALLERGIES: Allergies  Allergen Reactions  . Codeine Shortness Of Breath  . Hydrocodone Shortness Of Breath    Mild SOB    MEDICATIONS: Current Outpatient Medications on File Prior to Visit  Medication Sig Dispense Refill  . ALPRAZolam (XANAX) 0.5 MG tablet Take 1 tablet (0.5 mg total) by mouth 2 (two) times daily as needed for anxiety. 30 tablet 0  . glucose blood (ACCU-CHEK GUIDE) test strip Use as instructed 100 each 0  . levonorgestrel (MIRENA) 20 MCG/24HR IUD 1 each by Intrauterine route once.    . Melatonin 3 MG TABS Take by mouth.    Marland Kitchen NOVOFINE 32G X 6 MM MISC INJECT AS DIRECTED once DAILY. 100 each 0  . TRUE METRIX BLOOD GLUCOSE TEST test strip 1 each by Other route daily. 1 stick daily 100 each 0  . TRUEPLUS LANCETS 30G MISC Inject 1 Stick as directed daily. 100 each 0  No current facility-administered medications on file prior to visit.     PAST MEDICAL HISTORY: Past Medical History:  Diagnosis Date  . Anxiety   . Back pain   . Chest pain   . Depression   . Diabetes mellitus   . Diarrhea   . Edema    feet and legs  . Gallbladder problem   . 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 Heartburn   . HTN (hypertension)    no meds now  . Hyperlipidemia   . Obesity   . Palpitations   . Parotid mass    right  . PMDD (premenstrual dysphoric disorder) 01/02/2016  . Shortness of breath   . Vitamin D deficiency   . Vitamin D deficiency     PAST  SURGICAL HISTORY: Past Surgical History:  Procedure Laterality Date  . APPENDECTOMY    . CHOLECYSTECTOMY    . EUS N/A 03/23/2014   Procedure: ESOPHAGEAL ENDOSCOPIC ULTRASOUND (EUS) RADIAL;  Surgeon: Arta Silence, MD;  Location: [facility];  Service: Endoscopy;  Laterality: N/A;  . FINE NEEDLE ASPIRATION N/A 03/23/2014   Procedure: FINE NEEDLE ASPIRATION (FNA) LINEAR;  Surgeon: Arta Silence, MD;  Location: [facility];  Service: Endoscopy;  Laterality: N/A;  . PAROTIDECTOMY Right 04/02/2017   Procedure: RIGHT SUPERFICIAL PAROTIDECTOMY;  Surgeon: Jodi Marble, MD;  Location: [facility];  Service: ENT;  Laterality: Right;  . TONSILLECTOMY      SOCIAL HISTORY: Social History   Tobacco Use  . Smoking status: Never Smoker  . Smokeless tobacco: Never Used  Substance Use Topics  . Alcohol use: No  . Drug use: No    Frequency: 3.0 times per week    FAMILY HISTORY: Family History  Problem Relation Age of Onset  . Healthy Mother   . Hypertension Mother   . Hyperlipidemia Mother   . Alcoholism Mother   . Anxiety disorder Mother   . Drug abuse Mother   . Healthy Father   . Colon cancer Paternal Grandfather 80       colon    ROS: Review of Systems  Constitutional: Positive for weight loss.  Cardiovascular: Negative for chest pain.  Gastrointestinal: Negative for nausea and vomiting.  Musculoskeletal: Negative for myalgias.       Negative for muscle weakness  Psychiatric/Behavioral: Positive for depression. Negative for suicidal ideas.       Positive for stress    PHYSICAL EXAM: Blood pressure 103/68, pulse 76, temperature 98 F (36.7 C), temperature source Oral, height 5\' 5"  (1.651 m), weight 198 lb (89.8 kg), SpO2 100 %. Body mass index is 32.95 kg/m. Physical Exam  Constitutional: She is oriented to person, place, and time. She appears well-developed and well-nourished.  Cardiovascular: Normal rate.  Pulmonary/Chest: Effort normal.  Musculoskeletal:  Normal range of motion.  Neurological: She is oriented to person, place, and time.  Skin: Skin is warm and dry.  Psychiatric: She has a normal mood and affect. Her behavior is normal.  Vitals reviewed.   RECENT LABS AND TESTS: BMET    Component Value Date/Time   NA 140 03/31/2017 0945   NA 141 09/25/2016 0811   K 4.4 03/31/2017 0945   CL 109 03/31/2017 0945   CO2 19 (L) 03/31/2017 0945   GLUCOSE 102 (H) 03/31/2017 0945   BUN 17 03/31/2017 0945   BUN 11 09/25/2016 0811   CREATININE 0.64 03/31/2017 0945   CREATININE 0.68 02/27/2017 1601   CALCIUM 9.2 03/31/2017 0945  GFRNONAA >60 03/31/2017 0945   GFRNONAA 106 02/27/2017 1601   GFRAA >60 03/31/2017 0945   GFRAA 123 02/27/2017 1601   Lab Results  Component Value Date   HGBA1C 4.7 (L) 01/20/2017   HGBA1C 4.9 09/25/2016   HGBA1C 5.5 04/18/2016   HGBA1C 6.3 (H) 01/02/2016   HGBA1C WILL FOLLOW 01/02/2016   Lab Results  Component Value Date   INSULIN 11.0 01/20/2017   INSULIN 11.7 09/25/2016   INSULIN 21.8 04/18/2016   INSULIN 15.3 01/02/2016   INSULIN WILL FOLLOW 01/02/2016   CBC    Component Value Date/Time   WBC 10.0 03/20/2017 0930   WBC 6.8 02/27/2017 1601   RBC 4.50 03/20/2017 0930   RBC 4.08 02/27/2017 1601   HGB 13.9 03/20/2017 0930   HCT 42.2 03/20/2017 0930   PLT 286 02/27/2017 1601   PLT 392 (H) 04/18/2016 1201   MCV 94 03/20/2017 0930   MCH 30.9 03/20/2017 0930   MCH 31.1 02/27/2017 1601   MCHC 32.9 03/20/2017 0930   MCHC 34.8 02/27/2017 1601   RDW 13.2 03/20/2017 0930   LYMPHSABS 1.7 03/20/2017 0930   MONOABS 0.6 05/05/2014 0902   EOSABS 0.1 03/20/2017 0930   BASOSABS 0.0 03/20/2017 0930   Iron/TIBC/Ferritin/ %Sat No results found for: IRON, TIBC, FERRITIN, IRONPCTSAT Lipid Panel     Component Value Date/Time   CHOL 143 01/20/2017 0924   TRIG 68 01/20/2017 0924   HDL 37 (L) 01/20/2017 0924   CHOLHDL 3.4 05/05/2014 0902   VLDL 16 05/05/2014 0902   LDLCALC 92 01/20/2017 0924   Hepatic  Function Panel     Component Value Date/Time   PROT 6.8 02/27/2017 1601   PROT 7.5 09/25/2016 0811   ALBUMIN 4.5 09/25/2016 0811   AST 12 02/27/2017 1601   ALT 20 02/27/2017 1601   ALKPHOS 105 09/25/2016 0811   BILITOT 0.3 02/27/2017 1601   BILITOT 0.7 09/25/2016 0811      Component Value Date/Time   TSH 3.180 03/20/2017 0930   TSH 3.260 09/25/2016 0811   TSH 2.090 04/18/2016 1201   Results for KYRIANNA, BARLETTA (MRN [id number]) as of 05/26/2017 10:59  Ref. Range 03/20/2017 09:30  Vitamin D, 25-Hydroxy Latest Ref Range: 30.0 - 100.0 ng/mL 30.7   ASSESSMENT AND PLAN: Vitamin D deficiency - Plan: Vitamin D, Ergocalciferol, (DRISDOL) 50000 units CAPS capsule  Other hyperlipidemia - Plan: atorvastatin (LIPITOR) 10 MG tablet  Other depression - with emotional eating  - Plan: buPROPion (WELLBUTRIN SR) 200 MG 12 hr tablet  At risk for heart disease  Class 1 obesity with serious comorbidity and body mass index (BMI) of 33.0 to 33.9 in adult, unspecified obesity type - Plan: Liraglutide -Weight Management ([place]) 18 MG/3ML SOPN  PLAN:  Vitamin D Deficiency Jasmine [facility] was informed that low vitamin D levels contributes to fatigue and are associated with obesity, breast, and colon cancer. She agrees to continue to take prescription Vit D @50 ,000 IU every 3 days #20 with no refills and will follow up for routine testing of vitamin D, at least 2-3 times per year. She was informed of the risk of over-replacement of vitamin D and agrees to not increase her dose unless she discusses this with [place] first. Jasmine [facility] agrees to follow up with our clinic in 2 to 3 weeks.  Hyperlipidemia Jasmine [facility] was informed of the American Heart Association Guidelines emphasizing intensive lifestyle modifications as the first line treatment for hyperlipidemia. We discussed many lifestyle modifications today in depth, and Jasmine Martinez will continue to  work on decreasing saturated fats such as fatty red meat, butter and many  fried foods. She will also increase vegetables and lean protein in her diet and continue to work on exercise and weight loss efforts. Jasmine [facility] agrees to continue Lipitor 10 mg qd #30 with no refills and follow up as directed.  Cardiovascular risk counseling Jasmine [facility] was given extended (15 minutes) coronary artery disease prevention counseling today. She is 47 y.o. female and has risk factors for heart disease including obesity and hyperlipidemia. We discussed intensive lifestyle modifications today with an emphasis on specific weight loss instructions and strategies. Pt was also informed of the importance of increasing exercise and decreasing saturated fats to help prevent heart disease.  Depression with Emotional Eating Behaviors We discussed behavior modification techniques today to help Jasmine Martinez deal with her emotional eating and depression. She has agreed to continue Wellbutrin SR 200 mg qd #30 with no refills and follow up as directed.  Obesity Jasmine [facility] is currently in the action stage of change. As such, her goal is to continue with weight loss efforts She has agreed to follow a lower carbohydrate, vegetable and lean protein rich diet plan Jasmine [facility] has been instructed to work up to a goal of 150 minutes of combined cardio and strengthening exercise per week for weight loss and overall health benefits. We discussed the following Behavioral Modification Strategies today: increasing lean protein intake, decreasing simple carbohydrates  and emotional eating strategies  We discussed various medication options to help Jasmine Martinez with her weight loss efforts and we both agreed to continue Saxenda 3.0 mg subQ daily #5 pens with no refills  Jasmine [facility] has agreed to follow up with our clinic in 2 to 3 weeks. She was informed of the importance of frequent follow up visits to maximize her success with intensive lifestyle modifications for her multiple health conditions.   Corey Skains, am acting as  transcriptionist for Dennard Nip, MD

## 2017-06-03 DIAGNOSIS — F411 Generalized anxiety disorder: Secondary | ICD-10-CM | POA: Diagnosis not present

## 2017-06-07 ENCOUNTER — Telehealth: Payer: 59 | Admitting: Nurse Practitioner

## 2017-06-07 DIAGNOSIS — N3000 Acute cystitis without hematuria: Secondary | ICD-10-CM

## 2017-06-07 MED ORDER — NITROFURANTOIN MONOHYD MACRO 100 MG PO CAPS: 100.0000 mg | ORAL_CAPSULE | Freq: Two times a day (BID) | ORAL | 0 refills | Status: DC

## 2017-06-07 NOTE — Progress Notes (Signed)

## 2017-06-13 ENCOUNTER — Encounter: Payer: Self-pay | Admitting: Internal Medicine

## 2017-06-13 ENCOUNTER — Ambulatory Visit: Payer: 59 | Admitting: Internal Medicine

## 2017-06-13 VITALS — BP 110/70 | HR 88 | Temp 98.2°F | Ht 65.0 in | Wt 203.0 lb

## 2017-06-13 DIAGNOSIS — N3000 Acute cystitis without hematuria: Secondary | ICD-10-CM | POA: Diagnosis not present

## 2017-06-13 DIAGNOSIS — R8290 Unspecified abnormal findings in urine: Secondary | ICD-10-CM

## 2017-06-13 DIAGNOSIS — E875 Hyperkalemia: Secondary | ICD-10-CM | POA: Diagnosis not present

## 2017-06-13 DIAGNOSIS — R300 Dysuria: Secondary | ICD-10-CM | POA: Diagnosis not present

## 2017-06-13 DIAGNOSIS — M2550 Pain in unspecified joint: Secondary | ICD-10-CM | POA: Diagnosis not present

## 2017-06-13 DIAGNOSIS — E119 Type 2 diabetes mellitus without complications: Secondary | ICD-10-CM

## 2017-06-13 LAB — POCT URINALYSIS DIPSTICK
Bilirubin, UA: NEGATIVE
Glucose, UA: NEGATIVE
Ketones, UA: NEGATIVE
Nitrite, UA: NEGATIVE
Protein, UA: NEGATIVE
RBC UA: NEGATIVE
Spec Grav, UA: 1.015 (ref 1.010–1.025)
Urobilinogen, UA: 0.2 E.U./dL
pH, UA: 6.5 (ref 5.0–8.0)

## 2017-06-13 MED ORDER — CIPROFLOXACIN HCL 500 MG PO TABS: 500.0000 mg | ORAL_TABLET | Freq: Two times a day (BID) | ORAL | 0 refills | Status: DC

## 2017-06-13 MED FILL — CIPROFLOXACIN HCL 500 MG TA: 500 | 7 days supply | Qty: 14 | Fill #0

## 2017-06-13 NOTE — Progress Notes (Signed)
   Subjective:    Patient ID: Jasmine Martinez, female    DOB: 04-04-1970, 47 y.o.   MRN: [id number]  HPI 47 year old Female with Diabetes mellitus and with recurrent urinary symptoms.  She had an E- visit April 25 and was prescribed Keflex 500 mg twice a day for 7 days.  She had another ED visit on May 25 and was prescribed Macrobid 100 mg twice a day for 5 days.  No fever or shaking chills.  She felt better after the Macrobid but not 100% better.  Now has recurrent urinary symptoms.  Does not have hematuria.  No CVA tenderness.  No fever or shaking chills.  Has dysuria.    Review of Systems She is also having some issues with pain right fourth PIP joint as well as in her sacral area.  She is concerned about rheumatoid arthritis.  Arthritis studies will be done today.     Objective:   Physical Exam Dipstick UA is abnormal and culture was sent.  She has some tenderness and erythema right fourth PIP joint.  Tender in mid lower back.      Assessment & Plan:  Acute UTI-culture sent  Arthralgias-rheumatology studies drawn.  May need rheumatology consultation.  Plan: Rheumatology studies done and are pending.  She will be treated with Cipro 500 mg twice daily for 7 days with culture pending.

## 2017-06-13 NOTE — Patient Instructions (Signed)
Cipro 500 mg twice daily for 10 days.  Rheumatology studies drawn and pending.  May need Rheumatology consultation.

## 2017-06-16 ENCOUNTER — Other Ambulatory Visit: Payer: Self-pay | Admitting: Internal Medicine

## 2017-06-16 ENCOUNTER — Ambulatory Visit (INDEPENDENT_AMBULATORY_CARE_PROVIDER_SITE_OTHER): Payer: 59 | Admitting: Family Medicine

## 2017-06-16 VITALS — BP 104/65 | HR 71 | Temp 98.4°F | Ht 65.0 in | Wt 197.0 lb

## 2017-06-16 DIAGNOSIS — E119 Type 2 diabetes mellitus without complications: Secondary | ICD-10-CM | POA: Diagnosis not present

## 2017-06-16 DIAGNOSIS — F3289 Other specified depressive episodes: Secondary | ICD-10-CM | POA: Diagnosis not present

## 2017-06-16 DIAGNOSIS — M2550 Pain in unspecified joint: Secondary | ICD-10-CM

## 2017-06-16 DIAGNOSIS — E559 Vitamin D deficiency, unspecified: Secondary | ICD-10-CM

## 2017-06-16 DIAGNOSIS — E7849 Other hyperlipidemia: Secondary | ICD-10-CM | POA: Diagnosis not present

## 2017-06-16 DIAGNOSIS — Z9189 Other specified personal risk factors, not elsewhere classified: Secondary | ICD-10-CM

## 2017-06-16 DIAGNOSIS — R7989 Other specified abnormal findings of blood chemistry: Secondary | ICD-10-CM

## 2017-06-16 DIAGNOSIS — Z6832 Body mass index (BMI) 32.0-32.9, adult: Secondary | ICD-10-CM

## 2017-06-16 DIAGNOSIS — E669 Obesity, unspecified: Secondary | ICD-10-CM

## 2017-06-16 DIAGNOSIS — R768 Other specified abnormal immunological findings in serum: Secondary | ICD-10-CM

## 2017-06-16 MED ORDER — BUPROPION HCL ER (SR) 200 MG PO TB12: 200.0000 mg | ORAL_TABLET | Freq: Every day | ORAL | 0 refills | Status: DC

## 2017-06-16 MED ORDER — LIRAGLUTIDE -WEIGHT MANAGEMENT 18 MG/3ML ~~LOC~~ SOPN: 3.0000 mg | PEN_INJECTOR | Freq: Every day | SUBCUTANEOUS | 0 refills | Status: DC

## 2017-06-16 MED ORDER — VITAMIN D (ERGOCALCIFEROL) 1.25 MG (50000 UNIT) PO CAPS
50000.0000 [IU] | ORAL_CAPSULE | ORAL | 0 refills | Status: DC
Start: 2017-06-16 — End: 2017-07-07

## 2017-06-16 MED ORDER — ATORVASTATIN CALCIUM 10 MG PO TABS: 10.0000 mg | ORAL_TABLET | Freq: Every day | ORAL | 0 refills | Status: DC

## 2017-06-16 NOTE — Progress Notes (Signed)
Office: [phone]  /  Fax: [phone]   HPI:   Chief Complaint: OBESITY Jasmine Martinez is here to discuss her progress with her obesity treatment plan. She is on the lower carbohydrate, vegetable and lean protein rich diet plan and is following her eating plan approximately 95 % of the time. She states she is exercising 0 minutes 0 times per week. Jasmine Martinez continues to do well with weight loss. She is counting carbohydrates versus doing our low carbohydrate plan and calories are likely increased somewhat, but she is still able to lose. Tolerating Saxenda well but sometimes forgets to eat which leads to occasional hypoglycemia.  Her weight is 197 lb (89.4 kg) today and has had a weight loss of 1 pound over a period of 3 weeks since her last visit. She has lost 63 lbs since starting treatment with Jasmine Martinez.  Hyperlipidemia Jasmine Martinez has hyperlipidemia and has been trying to improve her cholesterol levels with intensive lifestyle modification including a low saturated fat diet, exercise and weight loss. She is stable on Lipitor and diet. She denies any chest pain, claudication or myalgias.  Diabetes II Jasmine Martinez has a diagnosis of diabetes type II. Jasmine Martinez has had some low BGs recently when she goes too long without eating. She admits to any hypoglycemic episodes. Last A1c was 4.7 on 01/20/17. She has been working on intensive lifestyle modifications including diet, exercise, and weight loss to help control her blood glucose levels.  At risk for cardiovascular disease Jasmine Martinez is at a higher than average risk for cardiovascular disease due to obesity, hyperlipidemia, and diabetes II. She currently denies any chest pain.  Vitamin D Deficiency Jasmine Martinez has a diagnosis of vitamin D deficiency. She is controlled on prescription Vit D and denies nausea, vomiting or muscle weakness.  Depression with emotional eating behaviors Jasmine Martinez's mood is stable on Wellbutrin, blood pressure stable and she denies  insomnia. Jasmine Martinez struggles with emotional eating and using food for comfort to the extent that it is negatively impacting her health. She often snacks when she is not hungry. Jasmine Martinez sometimes feels she is out of control and then feels guilty that she made poor food choices. She has been working on behavior modification techniques to help reduce her emotional eating and has been somewhat successful. She shows no sign of suicidal or homicidal ideations.  Depression screen Jasmine Martinez 2/9 04/23/2016 01/02/2016 02/14/2015 06/23/2014  Decreased Interest 0 1 0 1  Down, Depressed, Hopeless 0 1 0 1  PHQ - 2 Score 0 2 0 2  Altered sleeping - 2 - 1  Tired, decreased energy - 3 - 1  Change in appetite - 3 - 0  Feeling bad or failure about yourself  - 3 - 0  Trouble concentrating - 0 - 0  Moving slowly or fidgety/restless - 0 - 0  Suicidal thoughts - 0 - 0  PHQ-9 Score - 13 - 4  Difficult doing work/chores - - - Not difficult at all    ALLERGIES: Allergies  Allergen Reactions  . Codeine Shortness Of Breath  . Hydrocodone Shortness Of Breath    Mild SOB    MEDICATIONS: Current Outpatient Medications on File Prior to Visit  Medication Sig Dispense Refill  . ALPRAZolam (XANAX) 0.5 MG tablet Take 1 tablet (0.5 mg total) by mouth 2 (two) times daily as needed for anxiety. 30 tablet 0  . ciprofloxacin (CIPRO) 500 MG tablet Take 1 tablet (500 mg total) by mouth 2 (two) times daily. 14 tablet 0  . glucose blood (  ACCU-CHEK GUIDE) test strip Use as instructed 100 each 0  . levonorgestrel (MIRENA) 20 MCG/24HR IUD 1 each by Intrauterine route once.    . Melatonin 3 MG TABS Take by mouth.    Marland Kitchen NOVOFINE 32G X 6 MM MISC INJECT AS DIRECTED once DAILY. 100 each 0  . TRUEPLUS LANCETS 30G MISC Inject 1 Stick as directed daily. 100 each 0   No current facility-administered medications on file prior to visit.     PAST MEDICAL HISTORY: Past Medical History:  Diagnosis Date  . Anxiety   . Back pain   . Chest pain     . Depression   . Diabetes mellitus   . Diarrhea   . Edema    feet and legs  . Gallbladder problem   . 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 Heartburn   . HTN (hypertension)    no meds now  . Hyperlipidemia   . Obesity   . Palpitations   . Parotid mass    right  . PMDD (premenstrual dysphoric disorder) 01/02/2016  . Shortness of breath   . Vitamin D deficiency   . Vitamin D deficiency     PAST SURGICAL HISTORY: Past Surgical History:  Procedure Laterality Date  . APPENDECTOMY    . CHOLECYSTECTOMY    . EUS N/A 03/23/2014   Procedure: ESOPHAGEAL ENDOSCOPIC ULTRASOUND (EUS) RADIAL;  Surgeon: Arta Silence, MD;  Location: [facility];  Service: Endoscopy;  Laterality: N/A;  . FINE NEEDLE ASPIRATION N/A 03/23/2014   Procedure: FINE NEEDLE ASPIRATION (FNA) LINEAR;  Surgeon: Arta Silence, MD;  Location: [facility];  Service: Endoscopy;  Laterality: N/A;  . PAROTIDECTOMY Right 04/02/2017   Procedure: RIGHT SUPERFICIAL PAROTIDECTOMY;  Surgeon: Jodi Marble, MD;  Location: [facility];  Service: ENT;  Laterality: Right;  . TONSILLECTOMY      SOCIAL HISTORY: Social History   Tobacco Use  . Smoking status: Never Smoker  . Smokeless tobacco: Never Used  Substance Use Topics  . Alcohol use: No  . Drug use: No    Frequency: 3.0 times per week    FAMILY HISTORY: Family History  Problem Relation Age of Onset  . Healthy Mother   . Hypertension Mother   . Hyperlipidemia Mother   . Alcoholism Mother   . Anxiety disorder Mother   . Drug abuse Mother   . Healthy Father   . Colon cancer Paternal Grandfather 5       colon    ROS: Review of Systems  Constitutional: Positive for weight loss.  Cardiovascular: Negative for chest pain and claudication.  Gastrointestinal: Negative for nausea and vomiting.  Musculoskeletal: Negative for myalgias.       Negative muscle weakness  Endo/Heme/Allergies:       Positive hypoglycemia   Psychiatric/Behavioral: Positive for depression. Negative for suicidal ideas. The patient does not have insomnia.     PHYSICAL EXAM: Blood pressure 104/65, pulse 71, temperature 98.4 F (36.9 C), temperature source Oral, height 5\' 5"  (1.651 m), weight 197 lb (89.4 kg), SpO2 96 %. Body mass index is 32.78 kg/m. Physical Exam  Constitutional: She is oriented to person, place, and time. She appears well-developed and well-nourished.  Cardiovascular: Normal rate.  Pulmonary/Chest: Effort normal.  Musculoskeletal: Normal range of motion.  Neurological: She is oriented to person, place, and time.  Skin: Skin is warm and dry.  Psychiatric: She has a normal mood and affect. Her behavior is normal.  Vitals reviewed.  RECENT LABS AND TESTS: BMET    Component Value Date/Time   NA 140 03/31/2017 0945   NA 141 09/25/2016 0811   K 4.4 03/31/2017 0945   CL 109 03/31/2017 0945   CO2 19 (L) 03/31/2017 0945   GLUCOSE 102 (H) 03/31/2017 0945   BUN 17 03/31/2017 0945   BUN 11 09/25/2016 0811   CREATININE 0.64 03/31/2017 0945   CREATININE 0.68 02/27/2017 1601   CALCIUM 9.2 03/31/2017 0945   GFRNONAA >60 03/31/2017 0945   GFRNONAA 106 02/27/2017 1601   GFRAA >60 03/31/2017 0945   GFRAA 123 02/27/2017 1601   Lab Results  Component Value Date   HGBA1C 4.7 (L) 01/20/2017   HGBA1C 4.9 09/25/2016   HGBA1C 5.5 04/18/2016   HGBA1C 6.3 (H) 01/02/2016   HGBA1C WILL FOLLOW 01/02/2016   Lab Results  Component Value Date   INSULIN 11.0 01/20/2017   INSULIN 11.7 09/25/2016   INSULIN 21.8 04/18/2016   INSULIN 15.3 01/02/2016   INSULIN WILL FOLLOW 01/02/2016   CBC    Component Value Date/Time   WBC 9.8 06/13/2017 1607   RBC 4.27 06/13/2017 1607   HGB 13.2 06/13/2017 1607   HGB 13.9 03/20/2017 0930   HCT 37.7 06/13/2017 1607   HCT 42.2 03/20/2017 0930   PLT 288 06/13/2017 1607   PLT 392 (H) 04/18/2016 1201   MCV 88.3 06/13/2017 1607   MCV 94 03/20/2017 0930   MCH 30.9 06/13/2017 1607    MCHC 35.0 06/13/2017 1607   RDW 12.0 06/13/2017 1607   RDW 13.2 03/20/2017 0930   LYMPHSABS 2,264 06/13/2017 1607   LYMPHSABS 1.7 03/20/2017 0930   MONOABS 0.6 05/05/2014 0902   EOSABS 88 06/13/2017 1607   EOSABS 0.1 03/20/2017 0930   BASOSABS 29 06/13/2017 1607   BASOSABS 0.0 03/20/2017 0930   Iron/TIBC/Ferritin/ %Sat No results found for: IRON, TIBC, FERRITIN, IRONPCTSAT Lipid Panel     Component Value Date/Time   CHOL 143 01/20/2017 0924   TRIG 68 01/20/2017 0924   HDL 37 (L) 01/20/2017 0924   CHOLHDL 3.4 05/05/2014 0902   VLDL 16 05/05/2014 0902   LDLCALC 92 01/20/2017 0924   Hepatic Function Panel     Component Value Date/Time   PROT 6.9 06/13/2017 1607   PROT 7.5 09/25/2016 0811   ALBUMIN 4.5 09/25/2016 0811   AST 12 02/27/2017 1601   ALT 20 02/27/2017 1601   ALKPHOS 105 09/25/2016 0811   BILITOT 0.3 02/27/2017 1601   BILITOT 0.7 09/25/2016 0811      Component Value Date/Time   TSH 3.180 03/20/2017 0930   TSH 3.260 09/25/2016 0811   TSH 2.090 04/18/2016 1201  Results for SIRENIA, WHITIS (MRN [id number]) as of 06/16/2017 12:51  Ref. Range 03/20/2017 09:30  Vitamin D, 25-Hydroxy Latest Ref Range: 30.0 - 100.0 ng/mL 30.7    ASSESSMENT AND PLAN: Other hyperlipidemia - Plan: atorvastatin (LIPITOR) 10 MG tablet  Other depression - with emotional eating - Plan: buPROPion (WELLBUTRIN SR) 200 MG 12 hr tablet  Vitamin D deficiency - Plan: Vitamin D, Ergocalciferol, (DRISDOL) 50000 units CAPS capsule  Type 2 diabetes mellitus without complication, without long-term current use of insulin (HCC)  At risk for heart disease  Class 1 obesity with serious comorbidity and body mass index (BMI) of 32.0 to 32.9 in adult, unspecified obesity type - Plan: Liraglutide -Weight Management (SAXENDA) 18 MG/3ML SOPN  PLAN:  Hyperlipidemia Jasmine Martinez was informed of the American Heart Association Guidelines emphasizing intensive lifestyle modifications as the first line  treatment for hyperlipidemia. We discussed many lifestyle modifications today in depth, and Jasmine Martinez will continue to work on decreasing saturated fats such as fatty red meat, butter and many fried foods. She will also increase vegetables and lean protein in her diet and continue to work on exercise and weight loss efforts. Jasmine Martinez agrees to continue taking Lipitor 10 mg qd #30 and we will refill for 1 month. Jasmine Martinez agrees to follow up with our clinic in 3 weeks.  Diabetes II Jasmine Martinez has been given extensive diabetes education by myself today including ideal fasting and post-prandial blood glucose readings, individual ideal Hgb A1c goals and hypoglycemia prevention. We discussed the importance of good blood sugar control to decrease the likelihood of diabetic complications such as nephropathy, neuropathy, limb loss, blindness, coronary artery disease, and death. We discussed the importance of intensive lifestyle modification including diet, exercise and weight loss as the first line treatment for diabetes. Jasmine Martinez agrees to continue Jasmine Martinez at 2.4 and continue diet, she is to work to not skip meals or go longer than 6 hours without eating. Jasmine Martinez agrees to follow up with our clinic in 3 weeks.  Cardiovascular risk counselling Arshiya was given extended (15 minutes) coronary artery disease prevention counseling today. She is 47 y.o. female and has risk factors for heart disease including obesity, hyperlipidemia, and diabetes II. We discussed intensive lifestyle modifications today with an emphasis on specific weight loss instructions and strategies. Pt was also informed of the importance of increasing exercise and decreasing saturated fats to help prevent heart disease.  Vitamin D Deficiency Orena was informed that low vitamin D levels contributes to fatigue and are associated with obesity, breast, and colon cancer. Naryah agrees to continue taking prescription Vit D @50 ,000 IU every 3 days #10  and we will refill for 1 month. She will follow up for routine testing of vitamin D, at least 2-3 times per year. She was informed of the risk of over-replacement of vitamin D and agrees to not increase her dose unless she discusses this with Jasmine Martinez first. Yulieth agrees to follow up with our clinic in 3 weeks.  Depression with Emotional Eating Behaviors We discussed behavior modification techniques today to help Tattianna deal with her emotional eating and depression. Rashana agrees to continue taking Wellbutrin SR 200 mg qd #30 and we will refill for 1 month. Chrisette agrees to follow up with our clinic in 3 weeks.  Obesity Leiann is currently in the action stage of change. As such, her goal is to continue with weight loss efforts She has agreed to follow a lower carbohydrate, vegetable and lean protein rich diet plan Marcela has been instructed to work up to a goal of 150 minutes of combined cardio and strengthening exercise per week for weight loss and overall health benefits. We discussed the following Behavioral Modification Strategies today: increasing lean protein intake and decreasing simple carbohydrates  We discussed various medication options to help Valbona with her weight loss efforts and we both agreed to refill Saxenda at 3.0 (5 pens) Tess is to continue taking 2.4 mg daily currently    Lashe has agreed to follow up with our clinic in 3 weeks. She was informed of the importance of frequent follow up visits to maximize her success with intensive lifestyle modifications for her multiple health conditions.   I, Trixie Dredge, am acting as transcriptionist for Dennard Nip, MD  I have reviewed the above documentation for accuracy and completeness, and I agree with the above. -Dennard Nip, MD

## 2017-06-17 DIAGNOSIS — F411 Generalized anxiety disorder: Secondary | ICD-10-CM | POA: Diagnosis not present

## 2017-06-17 LAB — RHEUMATOID FACTOR: Rheumatoid fact SerPl-aCnc: <14 [IU]/mL

## 2017-06-17 LAB — CBC WITH DIFFERENTIAL/PLATELET
BASOS PCT: 0.3 %
Basophils Absolute: 29 cells/uL (ref 0–200)
Eosinophils Absolute: 88 cells/uL (ref 15–500)
Eosinophils Relative: 0.9 %
HCT: 37.7 % (ref 35.0–45.0)
Hemoglobin: 13.2 g/dL (ref 11.7–15.5)
LYMPHS ABS: 2264 {cells}/uL (ref 850–3900)
MCH: 30.9 pg (ref 27.0–33.0)
MCHC: 35 g/dL (ref 32.0–36.0)
MCV: 88.3 fL (ref 80.0–100.0)
MPV: 10.9 fL (ref 7.5–12.5)
Monocytes Relative: 7.7 %
NEUTROS ABS: 6664 {cells}/uL (ref 1500–7800)
Neutrophils Relative %: 68 %
PLATELETS: 288 10*3/uL (ref 140–400)
RBC: 4.27 10*6/uL (ref 3.80–5.10)
RDW: 12 % (ref 11.0–15.0)
TOTAL LYMPHOCYTE: 23.1 %
WBC: 9.8 10*3/uL (ref 3.8–10.8)
WBCMIX: 755 {cells}/uL (ref 200–950)

## 2017-06-17 LAB — URINE CULTURE
MICRO NUMBER:: 90657969
SPECIMEN QUALITY:: ADEQUATE

## 2017-06-17 LAB — PROTEIN ELECTROPHORESIS, SERUM
ALPHA 1: 0.3 g/dL (ref 0.2–0.3)
Albumin ELP: 4 g/dL (ref 3.8–4.8)
Alpha 2: 0.7 g/dL (ref 0.5–0.9)
BETA 2: 0.4 g/dL (ref 0.2–0.5)
Beta Globulin: 0.5 g/dL (ref 0.4–0.6)
GAMMA GLOBULIN: 1 g/dL (ref 0.8–1.7)
Total Protein: 6.9 g/dL (ref 6.1–8.1)

## 2017-06-17 LAB — CYCLIC CITRUL PEPTIDE ANTIBODY, IGG: CYCLIC CITRULLIN PEPTIDE AB: 32 U — AB

## 2017-06-17 LAB — ANA: Anti Nuclear Antibody(ANA): NEGATIVE

## 2017-06-17 LAB — SEDIMENTATION RATE: Sed Rate: 28 mm/h — ABNORMAL HIGH (ref 0–20)

## 2017-06-19 DIAGNOSIS — M2550 Pain in unspecified joint: Secondary | ICD-10-CM | POA: Diagnosis not present

## 2017-06-19 DIAGNOSIS — M7062 Trochanteric bursitis, left hip: Secondary | ICD-10-CM | POA: Diagnosis not present

## 2017-06-19 DIAGNOSIS — R5383 Other fatigue: Secondary | ICD-10-CM | POA: Diagnosis not present

## 2017-06-19 DIAGNOSIS — E669 Obesity, unspecified: Secondary | ICD-10-CM | POA: Diagnosis not present

## 2017-06-19 DIAGNOSIS — R768 Other specified abnormal immunological findings in serum: Secondary | ICD-10-CM | POA: Diagnosis not present

## 2017-06-19 DIAGNOSIS — M150 Primary generalized (osteo)arthritis: Secondary | ICD-10-CM | POA: Diagnosis not present

## 2017-06-19 DIAGNOSIS — M7061 Trochanteric bursitis, right hip: Secondary | ICD-10-CM | POA: Diagnosis not present

## 2017-06-19 DIAGNOSIS — Z6833 Body mass index (BMI) 33.0-33.9, adult: Secondary | ICD-10-CM | POA: Diagnosis not present

## 2017-06-30 ENCOUNTER — Telehealth: Payer: Self-pay | Admitting: Internal Medicine

## 2017-06-30 NOTE — Telephone Encounter (Signed)
Patient would like to get a second opinion from [place] regarding her concerns about RA.  She did see Dr. Trudie Reed, but just wants to see someone there at [facility] just for another opinion.    Can you recommend someone?  She doesn't have anyone in mind.  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    Thank you.

## 2017-07-01 DIAGNOSIS — F411 Generalized anxiety disorder: Secondary | ICD-10-CM | POA: Diagnosis not present

## 2017-07-01 NOTE — Telephone Encounter (Signed)
Patient called and selected Dr. Remer Macho.  Advised to fax information to the attention of Kelly at [phone].  I faxed demographic/clinical information to [facility] @ [phone] and they will contact the patient to set up an appointment for her for the 2nd opinion that the patient is requesting regarding Rheumatoid Arthritis.  Dx: Joint Pain.

## 2017-07-01 NOTE — Telephone Encounter (Signed)
I do not know of anyone in particular. Our reference book lists a phone number for Dept Chair:  Dr. Francella Solian. Wilfred Curtis. Perhaps his office can assist her.

## 2017-07-07 ENCOUNTER — Ambulatory Visit (INDEPENDENT_AMBULATORY_CARE_PROVIDER_SITE_OTHER): Payer: 59 | Admitting: Family Medicine

## 2017-07-07 VITALS — BP 107/72 | HR 64 | Temp 97.7°F | Ht 65.0 in | Wt 195.0 lb

## 2017-07-07 DIAGNOSIS — E669 Obesity, unspecified: Secondary | ICD-10-CM | POA: Diagnosis not present

## 2017-07-07 DIAGNOSIS — E7849 Other hyperlipidemia: Secondary | ICD-10-CM

## 2017-07-07 DIAGNOSIS — F3289 Other specified depressive episodes: Secondary | ICD-10-CM | POA: Diagnosis not present

## 2017-07-07 DIAGNOSIS — Z9189 Other specified personal risk factors, not elsewhere classified: Secondary | ICD-10-CM

## 2017-07-07 DIAGNOSIS — Z6832 Body mass index (BMI) 32.0-32.9, adult: Secondary | ICD-10-CM

## 2017-07-07 DIAGNOSIS — E559 Vitamin D deficiency, unspecified: Secondary | ICD-10-CM | POA: Diagnosis not present

## 2017-07-07 DIAGNOSIS — E119 Type 2 diabetes mellitus without complications: Secondary | ICD-10-CM | POA: Diagnosis not present

## 2017-07-07 MED ORDER — GLUCOSE BLOOD VI STRP: ORAL_STRIP | 0 refills | Status: DC

## 2017-07-07 MED ORDER — NOVOFINE 32G X 6 MM MISC: 0 refills | Status: DC

## 2017-07-07 MED ORDER — VITAMIN D (ERGOCALCIFEROL) 1.25 MG (50000 UNIT) PO CAPS: 50000.0000 [IU] | ORAL_CAPSULE | ORAL | 0 refills | Status: DC

## 2017-07-07 MED ORDER — BUPROPION HCL ER (SR) 200 MG PO TB12: 200.0000 mg | ORAL_TABLET | Freq: Every day | ORAL | 0 refills | Status: DC

## 2017-07-07 MED ORDER — ATORVASTATIN CALCIUM 10 MG PO TABS: 10.0000 mg | ORAL_TABLET | Freq: Every day | ORAL | 0 refills | Status: DC

## 2017-07-07 MED ORDER — LIRAGLUTIDE -WEIGHT MANAGEMENT 18 MG/3ML ~~LOC~~ SOPN: 3.0000 mg | PEN_INJECTOR | Freq: Every day | SUBCUTANEOUS | 0 refills | Status: DC

## 2017-07-07 MED FILL — ACCU-CHEK GUIDE STRP: 90 days supply | Qty: 100 | Fill #0

## 2017-07-07 MED FILL — SAXENDA 18 MG/3 ML PEN: 18 | 30 days supply | Qty: 15 | Fill #0

## 2017-07-07 MED FILL — VIT D2 1.25 MG (50,000 UNIT: 1.25 MG | 30 days supply | Qty: 10 | Fill #0

## 2017-07-07 MED FILL — BUPROPION HCL SR 200 MG TAB: 200 | 30 days supply | Qty: 30 | Fill #0

## 2017-07-07 MED FILL — ATORVASTATIN 10 MG TABLET: 10 | 30 days supply | Qty: 30 | Fill #0

## 2017-07-07 MED FILL — NOVOFINE 32G NEEDLES: 32G X 6 MM | 90 days supply | Qty: 100 | Fill #0

## 2017-07-07 NOTE — Progress Notes (Signed)
Office: [phone]  /  Fax: [phone]   HPI:   Chief Complaint: OBESITY Nalanie is here to discuss her progress with her obesity treatment plan. She is on the lower carbohydrate, vegetable and lean protein rich diet plan and is following her eating plan approximately 90 % of the time. She states she is walking on the treadmill for 60 minutes 1 time per week. Onyx continues to do well with weight loss on her low carb plan. She has started to try exercise classes and now has a group to exercise with. Her weight is 195 lb (88.5 kg) today and has had a weight loss of 2 pounds over a period of 3 weeks since her last visit. She has lost 65 lbs since starting treatment with [place].  Vitamin D deficiency Malory has a diagnosis of vitamin D deficiency. Sonnie is due for labs. She is stable on vit D and denies nausea, vomiting or muscle weakness.  Diabetes II Mikalia has a diagnosis of diabetes type II. Alzena is stable on Saxenda. Shatima denies nausea, vomiting or any hypoglycemic episodes. Last A1c was at 4.9 She is doing very well with diet and weight loss. Margi is due for labs. She has been working on intensive lifestyle modifications including diet, exercise, and weight loss to help control her blood glucose levels.  Hyperlipidemia Ellice has hyperlipidemia and is stable on Lipitor and diet. She has been trying to improve her cholesterol levels with intensive lifestyle modification including a low saturated fat diet, exercise and weight loss. She denies any chest pain, claudication or myalgias.  At risk for cardiovascular disease Jeannia is at a higher than average risk for cardiovascular disease due to obesity, diabetes and hyperlipidemia. She currently denies any chest pain  Depression with emotional eating behaviors [name] mood is stable on Wellbutrin. She has no insomnia and blood pressure is controlled. She feels she is in control of her emotional eating. Kaliya  struggles with emotional eating and using food for comfort to the extent that it is negatively impacting her health. She often snacks when she is not hungry. Arica sometimes feels she is out of control and then feels guilty that she made poor food choices. She has been working on behavior modification techniques to help reduce her emotional eating and has been somewhat successful. She shows no sign of suicidal or homicidal ideations.  Depression screen [facility] 2/9 04/23/2016 01/02/2016 02/14/2015 06/23/2014  Decreased Interest 0 1 0 1  Down, Depressed, Hopeless 0 1 0 1  PHQ - 2 Score 0 2 0 2  Altered sleeping - 2 - 1  Tired, decreased energy - 3 - 1  Change in appetite - 3 - 0  Feeling bad or failure about yourself  - 3 - 0  Trouble concentrating - 0 - 0  Moving slowly or fidgety/restless - 0 - 0  Suicidal thoughts - 0 - 0  PHQ-9 Score - 13 - 4  Difficult doing work/chores - - - Not difficult at all     ALLERGIES: Allergies  Allergen Reactions  . Codeine Shortness Of Breath  . Hydrocodone Shortness Of Breath    Mild SOB    MEDICATIONS: Current Outpatient Medications on File Prior to Visit  Medication Sig Dispense Refill  . ALPRAZolam (XANAX) 0.5 MG tablet Take 1 tablet (0.5 mg total) by mouth 2 (two) times daily as needed for anxiety. 30 tablet 0  . atorvastatin (LIPITOR) 10 MG tablet Take 1 tablet (10 mg total) by mouth daily.  30 tablet 0  . buPROPion (WELLBUTRIN SR) 200 MG 12 hr tablet Take 1 tablet (200 mg total) by mouth daily. 30 tablet 0  . ciprofloxacin (CIPRO) 500 MG tablet Take 1 tablet (500 mg total) by mouth 2 (two) times daily. 14 tablet 0  . glucose blood (ACCU-CHEK GUIDE) test strip Use as instructed 100 each 0  . levonorgestrel (MIRENA) 20 MCG/24HR IUD 1 each by Intrauterine route once.    . Liraglutide -Weight Management (SAXENDA) 18 MG/3ML SOPN Inject 3 mg into the skin daily. 5 pen 0  . Melatonin 3 MG TABS Take by mouth.    Marland Kitchen NOVOFINE 32G X 6 MM MISC INJECT AS  DIRECTED once DAILY. 100 each 0  . TRUEPLUS LANCETS 30G MISC Inject 1 Stick as directed daily. 100 each 0  . Vitamin D, Ergocalciferol, (DRISDOL) 50000 units CAPS capsule Take 1 capsule (50,000 Units total) by mouth every 3 (three) days. 10 capsule 0   No current facility-administered medications on file prior to visit.     PAST MEDICAL HISTORY: Past Medical History:  Diagnosis Date  . Anxiety   . Back pain   . Chest pain   . Depression   . Diabetes mellitus   . Diarrhea   . Edema    feet and legs  . Gallbladder problem   . 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 Heartburn   . HTN (hypertension)    no meds now  . Hyperlipidemia   . Obesity   . Palpitations   . Parotid mass    right  . PMDD (premenstrual dysphoric disorder) 01/02/2016  . Shortness of breath   . Vitamin D deficiency   . Vitamin D deficiency     PAST SURGICAL HISTORY: Past Surgical History:  Procedure Laterality Date  . APPENDECTOMY    . CHOLECYSTECTOMY    . EUS N/A 03/23/2014   Procedure: ESOPHAGEAL ENDOSCOPIC ULTRASOUND (EUS) RADIAL;  Surgeon: Arta Silence, MD;  Location: [facility];  Service: Endoscopy;  Laterality: N/A;  . FINE NEEDLE ASPIRATION N/A 03/23/2014   Procedure: FINE NEEDLE ASPIRATION (FNA) LINEAR;  Surgeon: Arta Silence, MD;  Location: [facility];  Service: Endoscopy;  Laterality: N/A;  . PAROTIDECTOMY Right 04/02/2017   Procedure: RIGHT SUPERFICIAL PAROTIDECTOMY;  Surgeon: Jodi Marble, MD;  Location: [place];  Service: ENT;  Laterality: Right;  . TONSILLECTOMY      SOCIAL HISTORY: Social History   Tobacco Use  . Smoking status: Never Smoker  . Smokeless tobacco: Never Used  Substance Use Topics  . Alcohol use: No  . Drug use: No    Frequency: 3.0 times per week    FAMILY HISTORY: Family History  Problem Relation Age of Onset  . Healthy Mother   . Hypertension Mother   . Hyperlipidemia Mother   . Alcoholism Mother   .  Anxiety disorder Mother   . Drug abuse Mother   . Healthy Father   . Colon cancer Paternal Grandfather 64       colon    ROS: Review of Systems  Constitutional: Positive for weight loss.  Cardiovascular: Negative for chest pain and claudication.  Gastrointestinal: Negative for nausea and vomiting.  Musculoskeletal: Negative for myalgias.       Negative for muscle weakness  Endo/Heme/Allergies:       Negative for hypoglycemia  Psychiatric/Behavioral: Positive for depression. Negative for suicidal ideas. The patient does not have insomnia.     PHYSICAL EXAM: Blood pressure  107/72, pulse 64, temperature 97.7 F (36.5 C), temperature source Oral, height 5\' 5"  (1.651 m), weight 195 lb (88.5 kg), SpO2 96 %. Body mass index is 32.45 kg/m. Physical Exam  Constitutional: She is oriented to person, place, and time. She appears well-developed and well-nourished.  Cardiovascular: Normal rate.  Pulmonary/Chest: Effort normal.  Musculoskeletal: Normal range of motion.  Neurological: She is oriented to person, place, and time.  Skin: Skin is warm and dry.  Psychiatric: She has a normal mood and affect. Her behavior is normal.  Vitals reviewed.   RECENT LABS AND TESTS: BMET    Component Value Date/Time   NA 140 03/31/2017 0945   NA 141 09/25/2016 0811   K 4.4 03/31/2017 0945   CL 109 03/31/2017 0945   CO2 19 (L) 03/31/2017 0945   GLUCOSE 102 (H) 03/31/2017 0945   BUN 17 03/31/2017 0945   BUN 11 09/25/2016 0811   CREATININE 0.64 03/31/2017 0945   CREATININE 0.68 02/27/2017 1601   CALCIUM 9.2 03/31/2017 0945   GFRNONAA >60 03/31/2017 0945   GFRNONAA 106 02/27/2017 1601   GFRAA >60 03/31/2017 0945   GFRAA 123 02/27/2017 1601   Lab Results  Component Value Date   HGBA1C 4.7 (L) 01/20/2017   HGBA1C 4.9 09/25/2016   HGBA1C 5.5 04/18/2016   HGBA1C 6.3 (H) 01/02/2016   HGBA1C WILL FOLLOW 01/02/2016   Lab Results  Component Value Date   INSULIN 11.0 01/20/2017   INSULIN 11.7  09/25/2016   INSULIN 21.8 04/18/2016   INSULIN 15.3 01/02/2016   INSULIN WILL FOLLOW 01/02/2016   CBC    Component Value Date/Time   WBC 9.8 06/13/2017 1607   RBC 4.27 06/13/2017 1607   HGB 13.2 06/13/2017 1607   HGB 13.9 03/20/2017 0930   HCT 37.7 06/13/2017 1607   HCT 42.2 03/20/2017 0930   PLT 288 06/13/2017 1607   PLT 392 (H) 04/18/2016 1201   MCV 88.3 06/13/2017 1607   MCV 94 03/20/2017 0930   MCH 30.9 06/13/2017 1607   MCHC 35.0 06/13/2017 1607   RDW 12.0 06/13/2017 1607   RDW 13.2 03/20/2017 0930   LYMPHSABS 2,264 06/13/2017 1607   LYMPHSABS 1.7 03/20/2017 0930   MONOABS 0.6 05/05/2014 0902   EOSABS 88 06/13/2017 1607   EOSABS 0.1 03/20/2017 0930   BASOSABS 29 06/13/2017 1607   BASOSABS 0.0 03/20/2017 0930   Iron/TIBC/Ferritin/ %Sat No results found for: IRON, TIBC, FERRITIN, IRONPCTSAT Lipid Panel     Component Value Date/Time   CHOL 143 01/20/2017 0924   TRIG 68 01/20/2017 0924   HDL 37 (L) 01/20/2017 0924   CHOLHDL 3.4 05/05/2014 0902   VLDL 16 05/05/2014 0902   LDLCALC 92 01/20/2017 0924   Hepatic Function Panel     Component Value Date/Time   PROT 6.9 06/13/2017 1607   PROT 7.5 09/25/2016 0811   ALBUMIN 4.5 09/25/2016 0811   AST 12 02/27/2017 1601   ALT 20 02/27/2017 1601   ALKPHOS 105 09/25/2016 0811   BILITOT 0.3 02/27/2017 1601   BILITOT 0.7 09/25/2016 0811      Component Value Date/Time   TSH 3.180 03/20/2017 0930   TSH 3.260 09/25/2016 0811   TSH 2.090 04/18/2016 1201   Results for EIRA, ALPERT (MRN [id number]) as of 07/07/2017 08:13  Ref. Range 03/20/2017 09:30  Vitamin D, 25-Hydroxy Latest Ref Range: 30.0 - 100.0 ng/mL 30.7   ASSESSMENT AND PLAN: Type 2 diabetes mellitus without complication, without long-term current use of insulin ([place]) - Plan:  Comprehensive metabolic panel, Hemoglobin A1c, Insulin, random, glucose blood (ACCU-CHEK GUIDE) test strip, NOVOFINE 32G X 6 MM MISC  Vitamin D deficiency - Plan: VITAMIN D 25 Hydroxy  (Vit-D Deficiency, Fractures), Vitamin D, Ergocalciferol, (DRISDOL) 50000 units CAPS capsule  Other hyperlipidemia - Plan: Comprehensive metabolic panel, atorvastatin (LIPITOR) 10 MG tablet  Other depression - with emotional eating  - Plan: buPROPion (WELLBUTRIN SR) 200 MG 12 hr tablet  At risk for heart disease  Class 1 obesity with serious comorbidity and body mass index (BMI) of 32.0 to 32.9 in adult, unspecified obesity type - Plan: Liraglutide -Weight Management (SAXENDA) 18 MG/3ML SOPN  PLAN:  Vitamin D Deficiency Monserath was informed that low vitamin D levels contributes to fatigue and are associated with obesity, breast, and colon cancer. She agrees to continue to take prescription Vit D @50 ,000 IU every 3 days #10 with no refills. We will check labs and she will follow up for routine testing of vitamin D, at least 2-3 times per year. She was informed of the risk of over-replacement of vitamin D and agrees to not increase her dose unless she discusses this with [place] first. Vanecia agrees to follow up as directed.  Diabetes II Sumire has been given extensive diabetes education by myself today including ideal fasting and post-prandial blood glucose readings, individual ideal Hgb A1c goals and hypoglycemia prevention. We discussed the importance of good blood sugar control to decrease the likelihood of diabetic complications such as nephropathy, neuropathy, limb loss, blindness, coronary artery disease, and death. We discussed the importance of intensive lifestyle modification including diet, exercise and weight loss as the first line treatment for diabetes. We will check labs and Cassandra agrees to continue her diabetes medications and will follow up at the agreed upon time. We will refill glucose strips.  Hyperlipidemia Teagen was informed of the American Heart Association Guidelines emphasizing intensive lifestyle modifications as the first line treatment for hyperlipidemia. We  discussed many lifestyle modifications today in depth, and Corin will continue to work on decreasing saturated fats such as fatty red meat, butter and many fried foods. She will also increase vegetables and lean protein in her diet and continue to work on exercise and weight loss efforts. We will check labs and Marra agrees to continue Lipitor 10 mg qd #30 with no refills. Corley agrees to follow up as directed.  Cardiovascular risk counseling Tracee was given extended (15 minutes) coronary artery disease prevention counseling today. She is 47 y.o. female and has risk factors for heart disease including obesity, diabetes and hyperlipidemia. We discussed intensive lifestyle modifications today with an emphasis on specific weight loss instructions and strategies. Pt was also informed of the importance of increasing exercise and decreasing saturated fats to help prevent heart disease.  Depression with Emotional Eating Behaviors We discussed behavior modification techniques today to help Lisbet deal with her emotional eating and depression. She has agreed to continue Wellbutrin SR 200 mg qd #30 with no refills and follow up as directed.  Obesity Azariya is currently in the action stage of change. As such, her goal is to continue with weight loss efforts She has agreed to follow a lower carbohydrate, vegetable and lean protein rich diet plan Tereka has been instructed to work up to a goal of 150 minutes of combined cardio and strengthening exercise per week for weight loss and overall health benefits. We discussed the following Behavioral Modification Strategies today: increasing lean protein intake and decreasing simple carbohydrates   We discussed various  medication options to help Jacquelyn with her weight loss efforts and we both agreed to continue Saxenda 3.0 (5 pens). Bisma is to continue taking 2.4 mg daily with no refills and we will refill glucose test strips.  Ladaysha has  agreed to follow up with our clinic in 3 weeks. She was informed of the importance of frequent follow up visits to maximize her success with intensive lifestyle modifications for her multiple health conditions.  I, Doreene Nest, am acting as transcriptionist for Dennard Nip, MD  I have reviewed the above documentation for accuracy and completeness, and I agree with the above. -Dennard Nip, MD

## 2017-07-08 LAB — COMPREHENSIVE METABOLIC PANEL
A/G RATIO: 1.6 (ref 1.2–2.2)
ALT: 13 IU/L (ref 0–32)
AST: 11 IU/L (ref 0–40)
Albumin: 4.4 g/dL (ref 3.5–5.5)
Alkaline Phosphatase: 92 IU/L (ref 39–117)
BUN/Creatinine Ratio: 18 (ref 9–23)
BUN: 12 mg/dL (ref 6–24)
Bilirubin Total: 0.4 mg/dL (ref 0.0–1.2)
CALCIUM: 9.5 mg/dL (ref 8.7–10.2)
CHLORIDE: 105 mmol/L (ref 96–106)
CO2: 20 mmol/L (ref 20–29)
Creatinine, Ser: 0.66 mg/dL (ref 0.57–1.00)
GFR calc Af Amer: 124 mL/min/{1.73_m2} (ref >59)
GFR, EST NON AFRICAN AMERICAN: 108 mL/min/{1.73_m2} (ref >59)
Globulin, Total: 2.7 g/dL (ref 1.5–4.5)
Glucose: 85 mg/dL (ref 65–99)
POTASSIUM: 4.3 mmol/L (ref 3.5–5.2)
Sodium: 141 mmol/L (ref 134–144)
Total Protein: 7.1 g/dL (ref 6.0–8.5)

## 2017-07-08 LAB — INSULIN, RANDOM: INSULIN: 8.1 u[IU]/mL (ref 2.6–24.9)

## 2017-07-08 LAB — HEMOGLOBIN A1C
Est. average glucose Bld gHb Est-mCnc: 85 mg/dL
Hgb A1c MFr Bld: 4.6 % — ABNORMAL LOW (ref 4.8–5.6)

## 2017-07-08 LAB — VITAMIN D 25 HYDROXY (VIT D DEFICIENCY, FRACTURES): VIT D 25 HYDROXY: 32.8 ng/mL (ref 30.0–100.0)

## 2017-07-15 DIAGNOSIS — F411 Generalized anxiety disorder: Secondary | ICD-10-CM | POA: Diagnosis not present

## 2017-07-22 DIAGNOSIS — F411 Generalized anxiety disorder: Secondary | ICD-10-CM | POA: Diagnosis not present

## 2017-08-04 ENCOUNTER — Ambulatory Visit (INDEPENDENT_AMBULATORY_CARE_PROVIDER_SITE_OTHER): Payer: 59 | Admitting: Family Medicine

## 2017-08-04 VITALS — BP 103/65 | HR 78 | Temp 98.0°F | Ht 65.0 in | Wt 196.0 lb

## 2017-08-04 DIAGNOSIS — Z9189 Other specified personal risk factors, not elsewhere classified: Secondary | ICD-10-CM | POA: Diagnosis not present

## 2017-08-04 DIAGNOSIS — E7849 Other hyperlipidemia: Secondary | ICD-10-CM

## 2017-08-04 DIAGNOSIS — F3289 Other specified depressive episodes: Secondary | ICD-10-CM

## 2017-08-04 DIAGNOSIS — Z6832 Body mass index (BMI) 32.0-32.9, adult: Secondary | ICD-10-CM

## 2017-08-04 DIAGNOSIS — E559 Vitamin D deficiency, unspecified: Secondary | ICD-10-CM

## 2017-08-04 DIAGNOSIS — E669 Obesity, unspecified: Secondary | ICD-10-CM | POA: Diagnosis not present

## 2017-08-04 MED ORDER — ATORVASTATIN CALCIUM 10 MG PO TABS: 10.0000 mg | ORAL_TABLET | Freq: Every day | ORAL | 0 refills | Status: DC

## 2017-08-04 MED ORDER — LIRAGLUTIDE -WEIGHT MANAGEMENT 18 MG/3ML ~~LOC~~ SOPN: 3.0000 mg | PEN_INJECTOR | Freq: Every day | SUBCUTANEOUS | 0 refills | Status: DC

## 2017-08-04 MED ORDER — VITAMIN D (ERGOCALCIFEROL) 1.25 MG (50000 UNIT) PO CAPS: 50000.0000 [IU] | ORAL_CAPSULE | ORAL | 0 refills | Status: DC

## 2017-08-04 MED ORDER — BUPROPION HCL ER (SR) 200 MG PO TB12: 200.0000 mg | ORAL_TABLET | Freq: Every day | ORAL | 0 refills | Status: DC

## 2017-08-04 MED FILL — BUPROPION HCL SR 200 MG TAB: 200 | 30 days supply | Qty: 30 | Fill #0

## 2017-08-04 MED FILL — SAXENDA 18 MG/3 ML PEN: 18 | 30 days supply | Qty: 15 | Fill #0

## 2017-08-04 MED FILL — ATORVASTATIN 10 MG TABLET: 10 | 30 days supply | Qty: 30 | Fill #0

## 2017-08-04 MED FILL — VIT D2 1.25 MG (50,000 UNIT: 1.25 MG | 30 days supply | Qty: 10 | Fill #0

## 2017-08-05 NOTE — Progress Notes (Signed)
Office: [phone]  /  Fax: [phone]   HPI:   Chief Complaint: OBESITY Jasmine Martinez is here to discuss her progress with her obesity treatment plan. She is on the lower carbohydrate, vegetable and lean protein rich diet plan and is following her eating plan approximately 75 % of the time. She states she is exercising 0 minutes 0 times per week. Jasmine Martinez continues to do well maintaining weight. She is not following low carbohydrate strictly but is eating a lower simple carbohydrate diet and is doing very well with meal planning and prepping. Family sabotage has improved.  Her weight is 196 lb (88.9 kg) today and has gained 1 pound since her last visit. She has lost 64 lbs since starting treatment with [place].  Vitamin D Deficiency Jasmine Martinez has a diagnosis of vitamin D deficiency. She is stable on prescription Vit D, fatigued improved but last level not yet at goal. She denies nausea, vomiting or muscle weakness.  Hyperlipidemia Jasmine Martinez has hyperlipidemia and has been trying to improve her cholesterol levels with intensive lifestyle modification including a low saturated fat diet, exercise and weight loss. She is stable on Lipitor, diet, and exercise. She denies any chest pain, claudication or myalgias.  At risk for cardiovascular disease Jasmine Martinez is at a higher than average risk for cardiovascular disease due to obesity and hyperlipidemia. She currently denies any chest pain.  Depression with emotional eating behaviors Jasmine Martinez's mood is stable on Wellbutrin, doing well with decreasing emotional eating and she states she feels more in control. Her blood pressure is stable. Jasmine Martinez struggles with emotional eating and using food for comfort to the extent that it is negatively impacting her health. She often snacks when she is not hungry. Jasmine Martinez sometimes feels she is out of control and then feels guilty that she made poor food choices. She has been working on behavior modification techniques  to help reduce her emotional eating and has been somewhat successful. She shows no sign of suicidal or homicidal ideations.  Depression screen [facility] 2/9 04/23/2016 01/02/2016 02/14/2015 06/23/2014  Decreased Interest 0 1 0 1  Down, Depressed, Hopeless 0 1 0 1  PHQ - 2 Score 0 2 0 2  Altered sleeping - 2 - 1  Tired, decreased energy - 3 - 1  Change in appetite - 3 - 0  Feeling bad or failure about yourself  - 3 - 0  Trouble concentrating - 0 - 0  Moving slowly or fidgety/restless - 0 - 0  Suicidal thoughts - 0 - 0  PHQ-9 Score - 13 - 4  Difficult doing work/chores - - - Not difficult at all    ALLERGIES: Allergies  Allergen Reactions  . Codeine Shortness Of Breath  . Hydrocodone Shortness Of Breath    Mild SOB    MEDICATIONS: Current Outpatient Medications on File Prior to Visit  Medication Sig Dispense Refill  . ALPRAZolam (XANAX) 0.5 MG tablet Take 1 tablet (0.5 mg total) by mouth 2 (two) times daily as needed for anxiety. 30 tablet 0  . glucose blood (ACCU-CHEK GUIDE) test strip Use as instructed 100 each 0  . levonorgestrel (MIRENA) 20 MCG/24HR IUD 1 each by Intrauterine route once.    . Melatonin 3 MG TABS Take by mouth.    Marland Kitchen NOVOFINE 32G X 6 MM MISC INJECT AS DIRECTED once DAILY. 100 each 0  . TRUEPLUS LANCETS 30G MISC Inject 1 Stick as directed daily. 100 each 0   No current facility-administered medications on file prior to visit.  PAST MEDICAL HISTORY: Past Medical History:  Diagnosis Date  . Anxiety   . Back pain   . Chest pain   . Depression   . Diabetes mellitus   . Diarrhea   . Edema    feet and legs  . Gallbladder problem   . 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 Heartburn   . HTN (hypertension)    no meds now  . Hyperlipidemia   . Obesity   . Palpitations   . Parotid mass    right  . PMDD (premenstrual dysphoric disorder) 01/02/2016  . Shortness of breath   . Vitamin D deficiency   . Vitamin D deficiency     PAST  SURGICAL HISTORY: Past Surgical History:  Procedure Laterality Date  . APPENDECTOMY    . CHOLECYSTECTOMY    . EUS N/A 03/23/2014   Procedure: ESOPHAGEAL ENDOSCOPIC ULTRASOUND (EUS) RADIAL;  Surgeon: Arta Silence, MD;  Location: [facility];  Service: Endoscopy;  Laterality: N/A;  . FINE NEEDLE ASPIRATION N/A 03/23/2014   Procedure: FINE NEEDLE ASPIRATION (FNA) LINEAR;  Surgeon: Arta Silence, MD;  Location: [facility];  Service: Endoscopy;  Laterality: N/A;  . PAROTIDECTOMY Right 04/02/2017   Procedure: RIGHT SUPERFICIAL PAROTIDECTOMY;  Surgeon: Jodi Marble, MD;  Location: [facility];  Service: ENT;  Laterality: Right;  . TONSILLECTOMY      SOCIAL HISTORY: Social History   Tobacco Use  . Smoking status: Never Smoker  . Smokeless tobacco: Never Used  Substance Use Topics  . Alcohol use: No  . Drug use: No    Frequency: 3.0 times per week    FAMILY HISTORY: Family History  Problem Relation Age of Onset  . Healthy Mother   . Hypertension Mother   . Hyperlipidemia Mother   . Alcoholism Mother   . Anxiety disorder Mother   . Drug abuse Mother   . Healthy Father   . Colon cancer Paternal Grandfather 59       colon    ROS: Review of Systems  Constitutional: Positive for malaise/fatigue. Negative for weight loss.  Cardiovascular: Negative for chest pain and claudication.  Gastrointestinal: Negative for nausea and vomiting.  Musculoskeletal: Negative for myalgias.       Negative muscle weakness  Psychiatric/Behavioral: Positive for depression. Negative for suicidal ideas.    PHYSICAL EXAM: Blood pressure 103/65, pulse 78, temperature 98 F (36.7 C), temperature source Oral, height 5\' 5"  (1.651 m), weight 196 lb (88.9 kg), SpO2 96 %. Body mass index is 32.62 kg/m. Physical Exam  Constitutional: She is oriented to person, place, and time. She appears well-developed and well-nourished.  Cardiovascular: Normal rate.  Pulmonary/Chest: Effort normal.    Musculoskeletal: Normal range of motion.  Neurological: She is oriented to person, place, and time.  Skin: Skin is warm and dry.  Psychiatric: She has a normal mood and affect. Her behavior is normal.  Vitals reviewed.   RECENT LABS AND TESTS: BMET    Component Value Date/Time   NA 141 07/07/2017 0825   K 4.3 07/07/2017 0825   CL 105 07/07/2017 0825   CO2 20 07/07/2017 0825   GLUCOSE 85 07/07/2017 0825   GLUCOSE 102 (H) 03/31/2017 0945   BUN 12 07/07/2017 0825   CREATININE 0.66 07/07/2017 0825   CREATININE 0.68 02/27/2017 1601   CALCIUM 9.5 07/07/2017 0825   GFRNONAA 108 07/07/2017 0825   GFRNONAA 106 02/27/2017 1601   GFRAA 124 07/07/2017 0825   GFRAA 123 02/27/2017 1601  Lab Results  Component Value Date   HGBA1C 4.6 (L) 07/07/2017   HGBA1C 4.7 (L) 01/20/2017   HGBA1C 4.9 09/25/2016   HGBA1C 5.5 04/18/2016   HGBA1C 6.3 (H) 01/02/2016   Lab Results  Component Value Date   INSULIN 8.1 07/07/2017   INSULIN 11.0 01/20/2017   INSULIN 11.7 09/25/2016   INSULIN 21.8 04/18/2016   INSULIN 15.3 01/02/2016   CBC    Component Value Date/Time   WBC 9.8 06/13/2017 1607   RBC 4.27 06/13/2017 1607   HGB 13.2 06/13/2017 1607   HGB 13.9 03/20/2017 0930   HCT 37.7 06/13/2017 1607   HCT 42.2 03/20/2017 0930   PLT 288 06/13/2017 1607   PLT 392 (H) 04/18/2016 1201   MCV 88.3 06/13/2017 1607   MCV 94 03/20/2017 0930   MCH 30.9 06/13/2017 1607   MCHC 35.0 06/13/2017 1607   RDW 12.0 06/13/2017 1607   RDW 13.2 03/20/2017 0930   LYMPHSABS 2,264 06/13/2017 1607   LYMPHSABS 1.7 03/20/2017 0930   MONOABS 0.6 05/05/2014 0902   EOSABS 88 06/13/2017 1607   EOSABS 0.1 03/20/2017 0930   BASOSABS 29 06/13/2017 1607   BASOSABS 0.0 03/20/2017 0930   Iron/TIBC/Ferritin/ %Sat No results found for: IRON, TIBC, FERRITIN, IRONPCTSAT Lipid Panel     Component Value Date/Time   CHOL 143 01/20/2017 0924   TRIG 68 01/20/2017 0924   HDL 37 (L) 01/20/2017 0924   CHOLHDL 3.4 05/05/2014  0902   VLDL 16 05/05/2014 0902   LDLCALC 92 01/20/2017 0924   Hepatic Function Panel     Component Value Date/Time   PROT 7.1 07/07/2017 0825   ALBUMIN 4.4 07/07/2017 0825   AST 11 07/07/2017 0825   ALT 13 07/07/2017 0825   ALKPHOS 92 07/07/2017 0825   BILITOT 0.4 07/07/2017 0825      Component Value Date/Time   TSH 3.180 03/20/2017 0930   TSH 3.260 09/25/2016 0811   TSH 2.090 04/18/2016 1201  Results for MERTIS, MOSHER (MRN [id number]) as of 08/05/2017 08:23  Ref. Range 07/07/2017 08:25  Vitamin D, 25-Hydroxy Latest Ref Range: 30.0 - 100.0 ng/mL 32.8    ASSESSMENT AND PLAN: Vitamin D deficiency - Plan: Vitamin D, Ergocalciferol, (DRISDOL) 50000 units CAPS capsule  Other hyperlipidemia - Plan: atorvastatin (LIPITOR) 10 MG tablet  Other depression - with emotional eating  - Plan: buPROPion (WELLBUTRIN SR) 200 MG 12 hr tablet  At risk for heart disease  Class 1 obesity with serious comorbidity and body mass index (BMI) of 32.0 to 32.9 in adult, unspecified obesity type - Plan: Liraglutide -Weight Management (SAXENDA) 18 MG/3ML SOPN  PLAN:  Vitamin D Deficiency Yitta was informed that low vitamin D levels contributes to fatigue and are associated with obesity, breast, and colon cancer. Kayline agrees to continue taking prescription Vit D @50 ,000 IU every 3 days #10 and we will refill for 1 month. She will follow up for routine testing of vitamin D, at least 2-3 times per year. She was informed of the risk of over-replacement of vitamin D and agrees to not increase her dose unless she discusses this with [place] first. Shoshana agrees to follow up with our clinic in 2 to 3 weeks.  Hyperlipidemia Skyla was informed of the American Heart Association Guidelines emphasizing intensive lifestyle modifications as the first line treatment for hyperlipidemia. We discussed many lifestyle modifications today in depth, and Pamella will continue to work on decreasing saturated fats such  as fatty red meat, butter and many fried foods.  She will also increase vegetables and lean protein in her diet and continue to work on exercise and weight loss efforts. Kayani agrees to continue taking Lipitor 10 mg qd #30 and we will refill for 1 month. Samamtha agrees to follow up with our clinic in 2 to 3 weeks.  Cardiovascular risk counselling Everlie was given extended (15 minutes) coronary artery disease prevention counseling today. She is 47 y.o. female and has risk factors for heart disease including obesity and hyperlipidemia. We discussed intensive lifestyle modifications today with an emphasis on specific weight loss instructions and strategies. Pt was also informed of the importance of increasing exercise and decreasing saturated fats to help prevent heart disease.  Depression with Emotional Eating Behaviors We discussed behavior modification techniques today to help Dilynn deal with her emotional eating and depression. Terriah agrees to continue taking Wellbutrin SR 200 mg qd #30 and we will refill for 1 month. Kristie agrees to follow up with our clinic in 2 to 3 weeks.  Obesity Reilly is currently in the action stage of change. As such, her goal is to continue with weight loss efforts She has agreed to portion control better and make smarter food choices, such as increase vegetables and decrease simple carbohydrates  Adaleena has been instructed to work up to a goal of 150 minutes of combined cardio and strengthening exercise per week for weight loss and overall health benefits. We discussed the following Behavioral Modification Strategies today: increasing lean protein intake, work on meal planning and easy cooking plans and dealing with family or coworker sabotage We discussed various medication options to help Jasmine Martinez with her weight loss efforts and we both agreed to continue Saxenda 3 mg qd #5 pens and we will refill for 1 month.  Kourtnee has agreed to follow up with  our clinic in 2 to 3 weeks. She was informed of the importance of frequent follow up visits to maximize her success with intensive lifestyle modifications for her multiple health conditions.   I, Trixie Dredge, am acting as transcriptionist for Dennard Nip, MD  I have reviewed the above documentation for accuracy and completeness, and I agree with the above. -Dennard Nip, MD

## 2017-08-12 DIAGNOSIS — F411 Generalized anxiety disorder: Secondary | ICD-10-CM | POA: Diagnosis not present

## 2017-08-25 ENCOUNTER — Ambulatory Visit (INDEPENDENT_AMBULATORY_CARE_PROVIDER_SITE_OTHER): Payer: 59 | Admitting: Family Medicine

## 2017-08-25 VITALS — BP 104/69 | HR 68 | Temp 97.6°F | Ht 65.0 in | Wt 198.0 lb

## 2017-08-25 DIAGNOSIS — E7849 Other hyperlipidemia: Secondary | ICD-10-CM | POA: Diagnosis not present

## 2017-08-25 DIAGNOSIS — Z6833 Body mass index (BMI) 33.0-33.9, adult: Secondary | ICD-10-CM | POA: Diagnosis not present

## 2017-08-25 DIAGNOSIS — Z9189 Other specified personal risk factors, not elsewhere classified: Secondary | ICD-10-CM | POA: Diagnosis not present

## 2017-08-25 DIAGNOSIS — E559 Vitamin D deficiency, unspecified: Secondary | ICD-10-CM

## 2017-08-25 DIAGNOSIS — E669 Obesity, unspecified: Secondary | ICD-10-CM

## 2017-08-25 DIAGNOSIS — F3289 Other specified depressive episodes: Secondary | ICD-10-CM | POA: Diagnosis not present

## 2017-08-25 MED ORDER — VITAMIN D (ERGOCALCIFEROL) 1.25 MG (50000 UNIT) PO CAPS: 50000.0000 [IU] | ORAL_CAPSULE | ORAL | 0 refills | Status: DC

## 2017-08-25 MED ORDER — BUPROPION HCL ER (SR) 200 MG PO TB12: 200.0000 mg | ORAL_TABLET | Freq: Every day | ORAL | 0 refills | Status: DC

## 2017-08-25 MED ORDER — LIRAGLUTIDE -WEIGHT MANAGEMENT 18 MG/3ML ~~LOC~~ SOPN: 3.0000 mg | PEN_INJECTOR | Freq: Every day | SUBCUTANEOUS | 0 refills | Status: DC

## 2017-08-25 MED ORDER — ATORVASTATIN CALCIUM 10 MG PO TABS: 10.0000 mg | ORAL_TABLET | Freq: Every day | ORAL | 0 refills | Status: DC

## 2017-08-26 DIAGNOSIS — F411 Generalized anxiety disorder: Secondary | ICD-10-CM | POA: Diagnosis not present

## 2017-08-26 NOTE — Progress Notes (Signed)
Office: [phone]  /  Fax: [phone]   HPI:   Chief Complaint: OBESITY Jasmine Martinez is here to discuss her progress with her obesity treatment plan. She is on the lower carbohydrate, vegetable and lean protein rich diet plan and is following her eating plan approximately 75 % of the time. She states she is doing cardio for 30 minutes 1 time per week. Jasmine Martinez had increased celebration eating for 10 days around her birthday with multiple celebrations. She is ready to get back on track and is back to planning and meal prep. She is on Saxenda at 2.4 mg but doesn't feel it is helping.  Her weight is 198 lb (89.8 kg) today and has gained 2 pounds since her last visit. She has lost 62 lbs since starting treatment with [place].  Hyperlipidemia Amiri has hyperlipidemia and has been trying to improve her cholesterol levels with intensive lifestyle modification including a low saturated fat diet, exercise and weight loss. She is stable on Lipitor and denies any chest pain, claudication or myalgias.  At risk for cardiovascular disease Jasmine Martinez is at a higher than average risk for cardiovascular disease due to obesity and hyperlipidemia. She currently denies any chest pain.  Vitamin D Deficiency Jasmine Martinez has a diagnosis of vitamin D deficiency. She is stable on prescription Vit D and denies nausea, vomiting or muscle weakness.  Depression with emotional eating behaviors Jasmine Martinez's mood is stable on Wellbutrin, notes some increased emotional eating with increased stress especially with husband's health issues. Jasmine Martinez struggles with emotional eating and using food for comfort to the extent that it is negatively impacting her health. She often snacks when she is not hungry. Jasmine Martinez sometimes feels she is out of control and then feels guilty that she made poor food choices. She has been working on behavior modification techniques to help reduce her emotional eating and has been somewhat successful. She  shows no sign of suicidal or homicidal ideations.  Depression screen [facility] 2/9 04/23/2016 01/02/2016 02/14/2015 06/23/2014  Decreased Interest 0 1 0 1  Down, Depressed, Hopeless 0 1 0 1  PHQ - 2 Score 0 2 0 2  Altered sleeping - 2 - 1  Tired, decreased energy - 3 - 1  Change in appetite - 3 - 0  Feeling bad or failure about yourself  - 3 - 0  Trouble concentrating - 0 - 0  Moving slowly or fidgety/restless - 0 - 0  Suicidal thoughts - 0 - 0  PHQ-9 Score - 13 - 4  Difficult doing work/chores - - - Not difficult at all    ALLERGIES: Allergies  Allergen Reactions  . Codeine Shortness Of Breath  . Hydrocodone Shortness Of Breath    Mild SOB    MEDICATIONS: Current Outpatient Medications on File Prior to Visit  Medication Sig Dispense Refill  . ALPRAZolam (XANAX) 0.5 MG tablet Take 1 tablet (0.5 mg total) by mouth 2 (two) times daily as needed for anxiety. 30 tablet 0  . glucose blood (ACCU-CHEK GUIDE) test strip Use as instructed 100 each 0  . levonorgestrel (MIRENA) 20 MCG/24HR IUD 1 each by Intrauterine route once.    . Melatonin 3 MG TABS Take by mouth.    Marland Kitchen NOVOFINE 32G X 6 MM MISC INJECT AS DIRECTED once DAILY. 100 each 0  . TRUEPLUS LANCETS 30G MISC Inject 1 Stick as directed daily. 100 each 0   No current facility-administered medications on file prior to visit.     PAST MEDICAL HISTORY: Past Medical  History:  Diagnosis Date  . Anxiety   . Back pain   . Chest pain   . Depression   . Diabetes mellitus   . Diarrhea   . Edema    feet and legs  . Gallbladder problem   . 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 Heartburn   . HTN (hypertension)    no meds now  . Hyperlipidemia   . Obesity   . Palpitations   . Parotid mass    right  . PMDD (premenstrual dysphoric disorder) 01/02/2016  . Shortness of breath   . Vitamin D deficiency   . Vitamin D deficiency     PAST SURGICAL HISTORY: Past Surgical History:  Procedure Laterality Date  .  APPENDECTOMY    . CHOLECYSTECTOMY    . EUS N/A 03/23/2014   Procedure: ESOPHAGEAL ENDOSCOPIC ULTRASOUND (EUS) RADIAL;  Surgeon: Arta Silence, MD;  Location: [facility];  Service: Endoscopy;  Laterality: N/A;  . FINE NEEDLE ASPIRATION N/A 03/23/2014   Procedure: FINE NEEDLE ASPIRATION (FNA) LINEAR;  Surgeon: Arta Silence, MD;  Location: [facility];  Service: Endoscopy;  Laterality: N/A;  . PAROTIDECTOMY Right 04/02/2017   Procedure: RIGHT SUPERFICIAL PAROTIDECTOMY;  Surgeon: Jodi Marble, MD;  Location: [facility];  Service: ENT;  Laterality: Right;  . TONSILLECTOMY      SOCIAL HISTORY: Social History   Tobacco Use  . Smoking status: Never Smoker  . Smokeless tobacco: Never Used  Substance Use Topics  . Alcohol use: No  . Drug use: No    Frequency: 3.0 times per week    FAMILY HISTORY: Family History  Problem Relation Age of Onset  . Healthy Mother   . Hypertension Mother   . Hyperlipidemia Mother   . Alcoholism Mother   . Anxiety disorder Mother   . Drug abuse Mother   . Healthy Father   . Colon cancer Paternal Grandfather 31       colon    ROS: Review of Systems  Constitutional: Negative for weight loss.  Cardiovascular: Negative for chest pain and claudication.  Gastrointestinal: Negative for nausea and vomiting.  Musculoskeletal: Negative for myalgias.       Negative muscle weakness  Psychiatric/Behavioral: Positive for depression. Negative for suicidal ideas.    PHYSICAL EXAM: Blood pressure 104/69, pulse 68, temperature 97.6 F (36.4 C), temperature source Oral, height 5\' 5"  (1.651 m), weight 198 lb (89.8 kg), SpO2 100 %. Body mass index is 32.95 kg/m. Physical Exam  Constitutional: She is oriented to person, place, and time. She appears well-developed and well-nourished.  Cardiovascular: Normal rate.  Pulmonary/Chest: Effort normal.  Musculoskeletal: Normal range of motion.  Neurological: She is oriented to person, place, and time.    Skin: Skin is warm and dry.  Psychiatric: She has a normal mood and affect. Her behavior is normal.  Vitals reviewed.   RECENT LABS AND TESTS: BMET    Component Value Date/Time   NA 141 07/07/2017 0825   K 4.3 07/07/2017 0825   CL 105 07/07/2017 0825   CO2 20 07/07/2017 0825   GLUCOSE 85 07/07/2017 0825   GLUCOSE 102 (H) 03/31/2017 0945   BUN 12 07/07/2017 0825   CREATININE 0.66 07/07/2017 0825   CREATININE 0.68 02/27/2017 1601   CALCIUM 9.5 07/07/2017 0825   GFRNONAA 108 07/07/2017 0825   GFRNONAA 106 02/27/2017 1601   GFRAA 124 07/07/2017 0825   GFRAA 123 02/27/2017 1601   Lab Results  Component Value Date  HGBA1C 4.6 (L) 07/07/2017   HGBA1C 4.7 (L) 01/20/2017   HGBA1C 4.9 09/25/2016   HGBA1C 5.5 04/18/2016   HGBA1C 6.3 (H) 01/02/2016   Lab Results  Component Value Date   INSULIN 8.1 07/07/2017   INSULIN 11.0 01/20/2017   INSULIN 11.7 09/25/2016   INSULIN 21.8 04/18/2016   INSULIN 15.3 01/02/2016   CBC    Component Value Date/Time   WBC 9.8 06/13/2017 1607   RBC 4.27 06/13/2017 1607   HGB 13.2 06/13/2017 1607   HGB 13.9 03/20/2017 0930   HCT 37.7 06/13/2017 1607   HCT 42.2 03/20/2017 0930   PLT 288 06/13/2017 1607   PLT 392 (H) 04/18/2016 1201   MCV 88.3 06/13/2017 1607   MCV 94 03/20/2017 0930   MCH 30.9 06/13/2017 1607   MCHC 35.0 06/13/2017 1607   RDW 12.0 06/13/2017 1607   RDW 13.2 03/20/2017 0930   LYMPHSABS 2,264 06/13/2017 1607   LYMPHSABS 1.7 03/20/2017 0930   MONOABS 0.6 05/05/2014 0902   EOSABS 88 06/13/2017 1607   EOSABS 0.1 03/20/2017 0930   BASOSABS 29 06/13/2017 1607   BASOSABS 0.0 03/20/2017 0930   Iron/TIBC/Ferritin/ %Sat No results found for: IRON, TIBC, FERRITIN, IRONPCTSAT Lipid Panel     Component Value Date/Time   CHOL 143 01/20/2017 0924   TRIG 68 01/20/2017 0924   HDL 37 (L) 01/20/2017 0924   CHOLHDL 3.4 05/05/2014 0902   VLDL 16 05/05/2014 0902   LDLCALC 92 01/20/2017 0924   Hepatic Function Panel      Component Value Date/Time   PROT 7.1 07/07/2017 0825   ALBUMIN 4.4 07/07/2017 0825   AST 11 07/07/2017 0825   ALT 13 07/07/2017 0825   ALKPHOS 92 07/07/2017 0825   BILITOT 0.4 07/07/2017 0825      Component Value Date/Time   TSH 3.180 03/20/2017 0930   TSH 3.260 09/25/2016 0811   TSH 2.090 04/18/2016 1201  Results for Goebel, Cayle P (MRN [id number]) as of 08/26/2017 12:15  Ref. Range 07/07/2017 08:25  Vitamin D, 25-Hydroxy Latest Ref Range: 30.0 - 100.0 ng/mL 32.8    ASSESSMENT AND PLAN: Other hyperlipidemia - Plan: atorvastatin (LIPITOR) 10 MG tablet  Vitamin D deficiency - Plan: Vitamin D, Ergocalciferol, (DRISDOL) 50000 units CAPS capsule  Other depression - with emotional eating  - Plan: buPROPion (WELLBUTRIN SR) 200 MG 12 hr tablet  At risk for heart disease  Class 1 obesity with serious comorbidity and body mass index (BMI) of 33.0 to 33.9 in adult, unspecified obesity type - Plan: Liraglutide -Weight Management (SAXENDA) 18 MG/3ML SOPN  PLAN:  Hyperlipidemia Jovee was informed of the American Heart Association Guidelines emphasizing intensive lifestyle modifications as the first line treatment for hyperlipidemia. We discussed many lifestyle modifications today in depth, and Kilah will continue to work on decreasing saturated fats such as fatty red meat, butter and many fried foods. She will also increase vegetables and lean protein in her diet and continue to work on exercise and weight loss efforts. Darnetta agrees to continue taking Lipitor 10 mg qd #30 and we will refill for 1 month. Annelies agrees to follow up with ur clinic in 2 weeks.  Cardiovascular risk counselling Bradlee was given extended (15 minutes) coronary artery disease prevention counseling today. She is 47 y.o. female and has risk factors for heart disease including obesity and hyperlipidemia. We discussed intensive lifestyle modifications today with an emphasis on specific weight loss  instructions and strategies. Pt was also informed of the importance of increasing exercise  and decreasing saturated fats to help prevent heart disease.  Vitamin D Deficiency Frankie was informed that low vitamin D levels contributes to fatigue and are associated with obesity, breast, and colon cancer. Kierah agrees to continue taking prescription Vit D @50 ,000 IU every 3 days #10 and we will refill for 1 month. She will follow up for routine testing of vitamin D, at least 2-3 times per year. She was informed of the risk of over-replacement of vitamin D and agrees to not increase her dose unless she discusses this with [place] first. Zakira agrees to follow up with our clinic in 2 weeks.  Depression with Emotional Eating Behaviors We discussed behavior modification techniques today to help Nazli deal with her emotional eating and depression. Evanthia agrees to continue taking Wellbutrin SR 200 mg qd #30 and we will refill for 1 month. Rayvon agrees to follow up with our clinic in 2 weeks.  Obesity Shelita is currently in the action stage of change. As such, her goal is to continue with weight loss efforts She has agreed to follow a lower carbohydrate, vegetable and lean protein rich diet plan Kenzey has been instructed to work up to a goal of 150 minutes of combined cardio and strengthening exercise per week for weight loss and overall health benefits. We discussed the following Behavioral Modification Strategies today: increasing lean protein intake, decreasing simple carbohydrates  and work on meal planning and easy cooking plans We discussed various medication options to help [place] with her weight loss efforts and we both agreed to continue Saxenda at 3.0 mg #5 pens and we will refill for 1 month.  Steven has agreed to follow up with our clinic in 2 weeks. She was informed of the importance of frequent follow up visits to maximize her success with intensive lifestyle modifications for  her multiple health conditions.   I, Trixie Dredge, am acting as transcriptionist for Dennard Nip, MD  I have reviewed the above documentation for accuracy and completeness, and I agree with the above. -Dennard Nip, MD

## 2017-08-28 MED FILL — VIT D2 1.25 MG (50,000 UNIT: 1.25 MG | 30 days supply | Qty: 10 | Fill #0

## 2017-08-28 MED FILL — ATORVASTATIN 10 MG TABLET: 10 | 30 days supply | Qty: 30 | Fill #0

## 2017-08-28 MED FILL — SAXENDA 18 MG/3 ML PEN: 18 | 30 days supply | Qty: 15 | Fill #0

## 2017-08-28 MED FILL — BUPROPION HCL SR 200 MG TAB: 200 | 30 days supply | Qty: 30 | Fill #0

## 2017-08-29 ENCOUNTER — Ambulatory Visit: Payer: 59 | Admitting: Internal Medicine

## 2017-08-29 ENCOUNTER — Ambulatory Visit
Admission: RE | Admit: 2017-08-29 | Discharge: 2017-08-29 | Disposition: A | Discharge status: Home / Self Care | Payer: 59 | Source: Ambulatory Visit | Attending: Internal Medicine | Admitting: Internal Medicine

## 2017-08-29 ENCOUNTER — Encounter: Payer: Self-pay | Admitting: Internal Medicine

## 2017-08-29 VITALS — BP 120/70 | HR 74 | Ht 65.0 in | Wt 201.0 lb

## 2017-08-29 DIAGNOSIS — M533 Sacrococcygeal disorders, not elsewhere classified: Secondary | ICD-10-CM | POA: Diagnosis not present

## 2017-08-29 MED ORDER — PREDNISONE 10 MG PO TABS: ORAL_TABLET | ORAL | 0 refills | Status: DC

## 2017-08-29 MED FILL — predniSONE 10 MG TABS: 10 | 6 days supply | Qty: 21 | Fill #0

## 2017-09-02 DIAGNOSIS — F411 Generalized anxiety disorder: Secondary | ICD-10-CM | POA: Diagnosis not present

## 2017-09-09 DIAGNOSIS — F411 Generalized anxiety disorder: Secondary | ICD-10-CM | POA: Diagnosis not present

## 2017-09-10 NOTE — Progress Notes (Signed)
   Subjective:    Patient ID: Jasmine Martinez, female    DOB: Feb 23, 1970, 47 y.o.   MRN: [id number]  HPI 47 year old Female with complaint of pain lower spine worse with sitting.  Denies any recent injury.    Review of Systems see above-history of diabetes mellitus     Objective:   Physical Exam  Tender over coccyx. Xray ordered      Assessment & Plan:  Coccydynia-explained to her that this is a difficult situation to treat.  We will start with some prednisone and tapering course going from 60 mg to 0 mg over 7 days.  May need to sit with a pillow on her seat.  Addendum: Sacrum and coccyx 2 view x-rays show no significant stenosis of the SI joints, no fractures or calcifications.

## 2017-09-10 NOTE — Patient Instructions (Signed)
X-ray ordered and proved to be negative.  Take prednisone and tapering course as directed.  Use pillow to sit on.  Call if not better in 2 to 4 weeks or sooner if worse.

## 2017-09-16 ENCOUNTER — Ambulatory Visit (INDEPENDENT_AMBULATORY_CARE_PROVIDER_SITE_OTHER): Payer: 59 | Admitting: Family Medicine

## 2017-09-16 VITALS — BP 104/69 | HR 68 | Temp 98.1°F | Ht 65.0 in | Wt 196.0 lb

## 2017-09-16 DIAGNOSIS — E669 Obesity, unspecified: Secondary | ICD-10-CM

## 2017-09-16 DIAGNOSIS — E119 Type 2 diabetes mellitus without complications: Secondary | ICD-10-CM | POA: Diagnosis not present

## 2017-09-16 DIAGNOSIS — E559 Vitamin D deficiency, unspecified: Secondary | ICD-10-CM | POA: Diagnosis not present

## 2017-09-16 DIAGNOSIS — Z9189 Other specified personal risk factors, not elsewhere classified: Secondary | ICD-10-CM

## 2017-09-16 DIAGNOSIS — Z6832 Body mass index (BMI) 32.0-32.9, adult: Secondary | ICD-10-CM | POA: Diagnosis not present

## 2017-09-16 DIAGNOSIS — E7849 Other hyperlipidemia: Secondary | ICD-10-CM | POA: Diagnosis not present

## 2017-09-16 DIAGNOSIS — F3289 Other specified depressive episodes: Secondary | ICD-10-CM | POA: Diagnosis not present

## 2017-09-16 DIAGNOSIS — F411 Generalized anxiety disorder: Secondary | ICD-10-CM | POA: Diagnosis not present

## 2017-09-16 DIAGNOSIS — R52 Pain, unspecified: Secondary | ICD-10-CM | POA: Diagnosis not present

## 2017-09-17 MED ORDER — TRUEPLUS LANCETS 30G MISC: 1.0000 | Freq: Every day | 0 refills | Status: DC

## 2017-09-17 MED ORDER — VITAMIN D (ERGOCALCIFEROL) 1.25 MG (50000 UNIT) PO CAPS: 50000.0000 [IU] | ORAL_CAPSULE | ORAL | 0 refills | Status: DC

## 2017-09-17 MED ORDER — NOVOFINE 32G X 6 MM MISC: 0 refills | Status: DC

## 2017-09-17 MED ORDER — BUPROPION HCL ER (SR) 200 MG PO TB12: 200.0000 mg | ORAL_TABLET | Freq: Every day | ORAL | 0 refills | Status: DC

## 2017-09-17 MED ORDER — ATORVASTATIN CALCIUM 10 MG PO TABS: 10.0000 mg | ORAL_TABLET | Freq: Every day | ORAL | 0 refills | Status: DC

## 2017-09-17 MED ORDER — GLUCOSE BLOOD VI STRP: ORAL_STRIP | 0 refills | Status: DC

## 2017-09-17 MED ORDER — LIRAGLUTIDE -WEIGHT MANAGEMENT 18 MG/3ML ~~LOC~~ SOPN: 3.0000 mg | PEN_INJECTOR | Freq: Every day | SUBCUTANEOUS | 0 refills | Status: DC

## 2017-09-17 NOTE — Progress Notes (Signed)
Office: [phone]  /  Fax: [phone]   HPI:   Chief Complaint: OBESITY Jasmine Martinez is here to discuss her progress with her obesity treatment plan. She is on the lower carbohydrate, vegetable and lean protein rich diet plan and is following her eating plan approximately 100 % of the time. She states she is exercising with a personal trainer for 60 minutes 3 times per week. Jasmine Martinez continues to do well with weight loss on her Low carbohydrate diet. She has started to work with a Physiological scientist and has increased exercise.  Her weight is 196 lb (88.9 kg) today and has had a weight loss of 2 pounds over a period of 3 weeks since her last visit. She has lost 64 lbs since starting treatment with [place].  Diabetes II Jasmine Martinez has a diagnosis of diabetes type II. Jasmine Martinez is on Liraglutide and has increased exercise but is feeling more hypoglycemic in the afternoons. Last A1c was 4.6. She has been working on intensive lifestyle modifications including diet, exercise, and weight loss to help control her blood glucose levels.  Hyperlipidemia Jasmine Martinez has hyperlipidemia and has been trying to improve her cholesterol levels with intensive lifestyle modification including a low saturated fat diet, exercise and weight loss. She is stable with diet and Lipitor and denies any chest pain, claudication or myalgias.  At risk for cardiovascular disease Jasmine Martinez is at a higher than average risk for cardiovascular disease due to obesity, diabetes II, and hyperlipidemia. She currently denies any chest pain.  Vitamin D Deficiency Jasmine Martinez has a diagnosis of vitamin D deficiency. She is stable on prescription Vit D and denies nausea, vomiting or muscle weakness.  Depression with emotional eating behaviors Jasmine Martinez mood is stable on Wellbutrin, and she is doing much better with emotional eating and feels more in control. Jasmine Martinez struggles with emotional eating and using food for comfort to the extent that it  is negatively impacting her health. She often snacks when she is not hungry. Jasmine Martinez sometimes feels she is out of control and then feels guilty that she made poor food choices. She has been working on behavior modification techniques to help reduce her emotional eating and has been somewhat successful. She shows no sign of suicidal or homicidal ideations.  Depression screen [facility] 2/9 04/23/2016 01/02/2016 02/14/2015 06/23/2014  Decreased Interest 0 1 0 1  Down, Depressed, Hopeless 0 1 0 1  PHQ - 2 Score 0 2 0 2  Altered sleeping - 2 - 1  Tired, decreased energy - 3 - 1  Change in appetite - 3 - 0  Feeling bad or failure about yourself  - 3 - 0  Trouble concentrating - 0 - 0  Moving slowly or fidgety/restless - 0 - 0  Suicidal thoughts - 0 - 0  PHQ-9 Score - 13 - 4  Difficult doing work/chores - - - Not difficult at all    ALLERGIES: Allergies  Allergen Reactions  . Codeine Shortness Of Breath  . Hydrocodone Shortness Of Breath    Mild SOB    MEDICATIONS: Current Outpatient Medications on File Prior to Visit  Medication Sig Dispense Refill  . ALPRAZolam (XANAX) 0.5 MG tablet Take 1 tablet (0.5 mg total) by mouth 2 (two) times daily as needed for anxiety. 30 tablet 0  . levonorgestrel (MIRENA) 20 MCG/24HR IUD 1 each by Intrauterine route once.    . Melatonin 3 MG TABS Take by mouth.     No current facility-administered medications on file prior to visit.  PAST MEDICAL HISTORY: Past Medical History:  Diagnosis Date  . Anxiety   . Back pain   . Chest pain   . Depression   . Diabetes mellitus   . Diarrhea   . Edema    feet and legs  . Gallbladder problem   . 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 Heartburn   . HTN (hypertension)    no meds now  . Hyperlipidemia   . Obesity   . Palpitations   . Parotid mass    right  . PMDD (premenstrual dysphoric disorder) 01/02/2016  . Shortness of breath   . Vitamin D deficiency   . Vitamin D deficiency      PAST SURGICAL HISTORY: Past Surgical History:  Procedure Laterality Date  . APPENDECTOMY    . CHOLECYSTECTOMY    . EUS N/A 03/23/2014   Procedure: ESOPHAGEAL ENDOSCOPIC ULTRASOUND (EUS) RADIAL;  Surgeon: Arta Silence, MD;  Location: [facility];  Service: Endoscopy;  Laterality: N/A;  . FINE NEEDLE ASPIRATION N/A 03/23/2014   Procedure: FINE NEEDLE ASPIRATION (FNA) LINEAR;  Surgeon: Arta Silence, MD;  Location: [facility];  Service: Endoscopy;  Laterality: N/A;  . PAROTIDECTOMY Right 04/02/2017   Procedure: RIGHT SUPERFICIAL PAROTIDECTOMY;  Surgeon: Jodi Marble, MD;  Location: [place] [facility];  Service: ENT;  Laterality: Right;  . TONSILLECTOMY      SOCIAL HISTORY: Social History   Tobacco Use  . Smoking status: Never Smoker  . Smokeless tobacco: Never Used  Substance Use Topics  . Alcohol use: No  . Drug use: No    Frequency: 3.0 times per week    FAMILY HISTORY: Family History  Problem Relation Age of Onset  . Healthy Mother   . Hypertension Mother   . Hyperlipidemia Mother   . Alcoholism Mother   . Anxiety disorder Mother   . Drug abuse Mother   . Healthy Father   . Colon cancer Paternal Grandfather 71       colon    ROS: Review of Systems  Constitutional: Positive for weight loss.  Cardiovascular: Negative for chest pain and claudication.  Gastrointestinal: Negative for nausea and vomiting.  Musculoskeletal: Negative for myalgias.       Negative muscle weakness  Endo/Heme/Allergies:       Positive hypoglycemia  Psychiatric/Behavioral: Positive for depression. Negative for suicidal ideas.    PHYSICAL EXAM: Blood pressure 104/69, pulse 68, temperature 98.1 F (36.7 C), temperature source Oral, height 5\' 5"  (1.651 m), weight 196 lb (88.9 kg), SpO2 94 %. Body mass index is 32.62 kg/m. Physical Exam  Constitutional: She is oriented to person, place, and time. She appears well-developed and well-nourished.  Cardiovascular: Normal rate.    Pulmonary/Chest: Effort normal.  Musculoskeletal: Normal range of motion.  Neurological: She is oriented to person, place, and time.  Skin: Skin is warm and dry.  Psychiatric: She has a normal mood and affect. Her behavior is normal.  Vitals reviewed.   RECENT LABS AND TESTS: BMET    Component Value Date/Time   NA 141 07/07/2017 0825   K 4.3 07/07/2017 0825   CL 105 07/07/2017 0825   CO2 20 07/07/2017 0825   GLUCOSE 85 07/07/2017 0825   GLUCOSE 102 (H) 03/31/2017 0945   BUN 12 07/07/2017 0825   CREATININE 0.66 07/07/2017 0825   CREATININE 0.68 02/27/2017 1601   CALCIUM 9.5 07/07/2017 0825   GFRNONAA 108 07/07/2017 0825   GFRNONAA 106 02/27/2017 1601   GFRAA 124 07/07/2017  0825   GFRAA 123 02/27/2017 1601   Lab Results  Component Value Date   HGBA1C 4.6 (L) 07/07/2017   HGBA1C 4.7 (L) 01/20/2017   HGBA1C 4.9 09/25/2016   HGBA1C 5.5 04/18/2016   HGBA1C 6.3 (H) 01/02/2016   Lab Results  Component Value Date   INSULIN 8.1 07/07/2017   INSULIN 11.0 01/20/2017   INSULIN 11.7 09/25/2016   INSULIN 21.8 04/18/2016   INSULIN 15.3 01/02/2016   CBC    Component Value Date/Time   WBC 9.8 06/13/2017 1607   RBC 4.27 06/13/2017 1607   HGB 13.2 06/13/2017 1607   HGB 13.9 03/20/2017 0930   HCT 37.7 06/13/2017 1607   HCT 42.2 03/20/2017 0930   PLT 288 06/13/2017 1607   PLT 392 (H) 04/18/2016 1201   MCV 88.3 06/13/2017 1607   MCV 94 03/20/2017 0930   MCH 30.9 06/13/2017 1607   MCHC 35.0 06/13/2017 1607   RDW 12.0 06/13/2017 1607   RDW 13.2 03/20/2017 0930   LYMPHSABS 2,264 06/13/2017 1607   LYMPHSABS 1.7 03/20/2017 0930   MONOABS 0.6 05/05/2014 0902   EOSABS 88 06/13/2017 1607   EOSABS 0.1 03/20/2017 0930   BASOSABS 29 06/13/2017 1607   BASOSABS 0.0 03/20/2017 0930   Iron/TIBC/Ferritin/ %Sat No results found for: IRON, TIBC, FERRITIN, IRONPCTSAT Lipid Panel     Component Value Date/Time   CHOL 143 01/20/2017 0924   TRIG 68 01/20/2017 0924   HDL 37 (L)  01/20/2017 0924   CHOLHDL 3.4 05/05/2014 0902   VLDL 16 05/05/2014 0902   LDLCALC 92 01/20/2017 0924   Hepatic Function Panel     Component Value Date/Time   PROT 7.1 07/07/2017 0825   ALBUMIN 4.4 07/07/2017 0825   AST 11 07/07/2017 0825   ALT 13 07/07/2017 0825   ALKPHOS 92 07/07/2017 0825   BILITOT 0.4 07/07/2017 0825      Component Value Date/Time   TSH 3.180 03/20/2017 0930   TSH 3.260 09/25/2016 0811   TSH 2.090 04/18/2016 1201  Results for KATHREEN, DILEO (MRN [id number]) as of 09/17/2017 15:49  Ref. Range 07/07/2017 08:25  Vitamin D, 25-Hydroxy Latest Ref Range: 30.0 - 100.0 ng/mL 32.8    ASSESSMENT AND PLAN: Type 2 diabetes mellitus without complication, without long-term current use of insulin (HCC) - Plan: Liraglutide -Weight Management (SAXENDA) 18 MG/3ML SOPN, glucose blood (ACCU-CHEK GUIDE) test strip, TRUEPLUS LANCETS 30G MISC, NOVOFINE 32G X 6 MM MISC  Other hyperlipidemia - Plan: atorvastatin (LIPITOR) 10 MG tablet  Vitamin D deficiency - Plan: Vitamin D, Ergocalciferol, (DRISDOL) 50000 units CAPS capsule  Other depression - with emotional eating  - Plan: buPROPion (WELLBUTRIN SR) 200 MG 12 hr tablet  At risk for heart disease  Class 1 obesity with serious comorbidity and body mass index (BMI) of 32.0 to 32.9 in adult, unspecified obesity type  PLAN:  Diabetes II Lidya has been given extensive diabetes education by myself today including ideal fasting and post-prandial blood glucose readings, individual ideal Hgb A1c goals and hypoglycemia prevention. We discussed the importance of good blood sugar control to decrease the likelihood of diabetic complications such as nephropathy, neuropathy, limb loss, blindness, coronary artery disease, and death. We discussed the importance of intensive lifestyle modification including diet, exercise and weight loss as the first line treatment for diabetes. Jasmine Martinez agrees to continue her diabetes medications and we will  refill test strips and lancets for 3 months. Jasmine Martinez agrees to follow up with our clinic in 3 weeks.   Hyperlipidemia  Jasmine Martinez was informed of the American Heart Association Guidelines emphasizing intensive lifestyle modifications as the first line treatment for hyperlipidemia. We discussed many lifestyle modifications today in depth, and Jasmine Martinez will continue to work on decreasing saturated fats such as fatty red meat, butter and many fried foods. She will also increase vegetables and lean protein in her diet and continue to work on diet, exercise, and weight loss efforts. Jasmine Martinez agrees to continue taking Lipitor 10 mg qd #30 and we will refill for 1 month. Jasmine Martinez agrees to follow up with our clinic in 3 weeks.  Cardiovascular risk counselling Jasmine Martinez was given extended (15 minutes) coronary artery disease prevention counseling today. She is 47 y.o. female and has risk factors for heart disease including obesity, diabetes II, and hyperlipidemia. We discussed intensive lifestyle modifications today with an emphasis on specific weight loss instructions and strategies. Pt was also informed of the importance of increasing exercise and decreasing saturated fats to help prevent heart disease.  Vitamin D Deficiency Jasmine Martinez was informed that low vitamin D levels contributes to fatigue and are associated with obesity, breast, and colon cancer. Jasmine Martinez agrees to continue taking prescription Vit D @50 ,000 IU every 3 days #10 and we will refill for 1 month. She will follow up for routine testing of vitamin D, at least 2-3 times per year. She was informed of the risk of over-replacement of vitamin D and agrees to not increase her dose unless she discusses this with [place] first. Jasmine Martinez agrees to follow up with our clinic in 3 weeks.  Depression with Emotional Eating Behaviors We discussed behavior modification techniques today to help Jasmine Martinez deal with her emotional eating and depression. Jasmine Martinez agrees to  continue taking Wellbutrin SR 200 mg qd #30 and we will refill for 1 month. Jasmine Martinez agrees to follow up with our clinic in 3 weeks.  Obesity Jasmine Martinez is currently in the action stage of change. As such, her goal is to continue with weight loss efforts She has agreed to follow a lower carbohydrate, vegetable and lean protein rich diet plan Jasmine Martinez has been instructed to work up to a goal of 150 minutes of combined cardio and strengthening exercise per week for weight loss and overall health benefits. We discussed the following Behavioral Modification Strategies today: increasing lean protein intake, increasing vegetables, and increase H20 intake We discussed various medication options to help Zaharah with her weight loss efforts and we both agreed to continue Saxenda 3 mg q AM #5 pens and we will refill for 1 month and we will refill nano needles for 1 month.  Demaris has agreed to follow up with our clinic in 3 weeks. She was informed of the importance of frequent follow up visits to maximize her success with intensive lifestyle modifications for her multiple health conditions.   OBESITY BEHAVIORAL INTERVENTION VISIT  Today's visit was # 86.   Starting weight: 260 lbs Starting date: 01/02/16 Today's weight : 196 lbs  Today's date: 09/16/2017 Total lbs lost to date: 41    ASK: We discussed the diagnosis of obesity with [name] today and Jasmine Martinez agreed to give [place] permission to discuss obesity behavioral modification therapy today.  ASSESS: Larena has the diagnosis of obesity and her BMI today is 32.62 Jeaneane is in the action stage of change   ADVISE: Patriciann was educated on the multiple health risks of obesity as well as the benefit of weight loss to improve her health. She was advised of the need for long term treatment  and the importance of lifestyle modifications to improve her current health and to decrease her risk of future health problems.  AGREE: Multiple  dietary modification options and treatment options were discussed and  Leandra agreed to follow the recommendations documented in the above note.  ARRANGE: Cythnia was educated on the importance of frequent visits to treat obesity as outlined per CMS and USPSTF guidelines and agreed to schedule her next follow up appointment today.  I, Trixie Dredge, am acting as transcriptionist for Dennard Nip, MD  I have reviewed the above documentation for accuracy and completeness, and I agree with the above. -Dennard Nip, MD

## 2017-09-23 DIAGNOSIS — F411 Generalized anxiety disorder: Secondary | ICD-10-CM | POA: Diagnosis not present

## 2017-10-08 ENCOUNTER — Ambulatory Visit (INDEPENDENT_AMBULATORY_CARE_PROVIDER_SITE_OTHER): Payer: Self-pay | Admitting: Family Medicine

## 2017-10-09 ENCOUNTER — Ambulatory Visit (INDEPENDENT_AMBULATORY_CARE_PROVIDER_SITE_OTHER): Payer: 59 | Admitting: Family Medicine

## 2017-10-09 VITALS — BP 106/66 | HR 52 | Temp 98.1°F | Ht 65.0 in | Wt 195.0 lb

## 2017-10-09 DIAGNOSIS — Z9189 Other specified personal risk factors, not elsewhere classified: Secondary | ICD-10-CM | POA: Diagnosis not present

## 2017-10-09 DIAGNOSIS — Z6832 Body mass index (BMI) 32.0-32.9, adult: Secondary | ICD-10-CM

## 2017-10-09 DIAGNOSIS — E119 Type 2 diabetes mellitus without complications: Secondary | ICD-10-CM

## 2017-10-09 DIAGNOSIS — E7849 Other hyperlipidemia: Secondary | ICD-10-CM | POA: Diagnosis not present

## 2017-10-09 DIAGNOSIS — E559 Vitamin D deficiency, unspecified: Secondary | ICD-10-CM | POA: Diagnosis not present

## 2017-10-09 DIAGNOSIS — E669 Obesity, unspecified: Secondary | ICD-10-CM

## 2017-10-09 DIAGNOSIS — F3289 Other specified depressive episodes: Secondary | ICD-10-CM | POA: Diagnosis not present

## 2017-10-09 MED ORDER — ATORVASTATIN CALCIUM 10 MG PO TABS: 10.0000 mg | ORAL_TABLET | Freq: Every day | ORAL | 0 refills | Status: DC

## 2017-10-09 MED ORDER — BUPROPION HCL ER (SR) 200 MG PO TB12: 200.0000 mg | ORAL_TABLET | Freq: Every day | ORAL | 0 refills | Status: DC

## 2017-10-09 MED ORDER — LIRAGLUTIDE -WEIGHT MANAGEMENT 18 MG/3ML ~~LOC~~ SOPN: 3.0000 mg | PEN_INJECTOR | Freq: Every day | SUBCUTANEOUS | 0 refills | Status: DC

## 2017-10-09 MED ORDER — GLUCOSE BLOOD VI STRP: ORAL_STRIP | 0 refills | Status: DC

## 2017-10-09 MED ORDER — TRUEPLUS LANCETS 30G MISC: 1.0000 | Freq: Every day | 0 refills | Status: DC

## 2017-10-09 MED ORDER — VITAMIN D (ERGOCALCIFEROL) 1.25 MG (50000 UNIT) PO CAPS: 50000.0000 [IU] | ORAL_CAPSULE | ORAL | 0 refills | Status: DC

## 2017-10-09 MED FILL — ATORVASTATIN 10 MG TABLET: 10 | 30 days supply | Qty: 30 | Fill #0

## 2017-10-09 MED FILL — VIT D2 1.25 MG (50,000 UNIT: 1.25 MG | 30 days supply | Qty: 10 | Fill #0

## 2017-10-09 MED FILL — BUPROPION HCL SR 200 MG TAB: 200 | 30 days supply | Qty: 30 | Fill #0

## 2017-10-09 MED FILL — ACCU-CHEK GUIDE STRP: 90 days supply | Qty: 100 | Fill #0

## 2017-10-09 MED FILL — ACCU-CHEK FASTCLIX LANCETS: 90 days supply | Qty: 102 | Fill #0

## 2017-10-09 MED FILL — SAXENDA 18 MG/3 ML PEN: 18 | 30 days supply | Qty: 15 | Fill #0

## 2017-10-09 NOTE — Progress Notes (Signed)
Office: [phone]  /  Fax: [phone]   HPI:   Chief Complaint: OBESITY Jasmine Martinez is here to discuss her progress with her obesity treatment plan. She is on the lower carbohydrate, vegetable and lean protein rich diet plan and is following her eating plan approximately 100 % of the time. She states she is exercising with a personal trainer 60 minutes 3 to 4 times per week. Jasmine Martinez continues to do well with weight loss. She is exercising in the morning three to four times per week. She feels her hunger is controlled and she is doing well with controlling her portions and making smarter food choices. She is not on the low carb plan, but she is limiting her simple carbohydrates. Her weight is 195 lb (88.5 kg) today and has had a weight loss of 1 pound over a period of 3 weeks since her last visit. She has lost 65 lbs since starting treatment with [place].  Diabetes II Jasmine Martinez has a diagnosis of diabetes type II. Jasmine Martinez notes that her blood sugar dropped to the 70's after exercise. She is on Saxenda, only at 2.4 mg. She tries to eat before, but she still notes that her blood sugars are low approximately 1 to 2 hours after meals. Last A1c was at 4.6 She has been working on intensive lifestyle modifications including diet, exercise, and weight loss to help control her blood glucose levels.  Hyperlipidemia Jasmine Martinez has hyperlipidemia and she is stable on Lipitor. She has been trying to improve her cholesterol levels with intensive lifestyle modification including a low saturated fat diet, exercise and weight loss. Jasmine Martinez is doing very well with her diet. She denies any chest pain or myalgias.  At risk for cardiovascular disease Jasmine Martinez is at a higher than average risk for cardiovascular disease due to obesity, diabetes and hyperlipidemia. She currently denies any chest pain.  Vitamin D deficiency Jasmine Martinez has a diagnosis of vitamin D deficiency. Jasmine Martinez is stable on vit D and she denies  nausea, vomiting or muscle weakness.  Depression with emotional eating behaviors Jasmine Martinez mood is stable on Wellbutrin and she has good control of her emotional eating. Jasmine Martinez struggles with emotional eating and using food for comfort to the extent that it is negatively impacting her health. She often snacks when she is not hungry. Jasmine Martinez sometimes feels she is out of control and then feels guilty that she made poor food choices. She has been working on behavior modification techniques to help reduce her emotional eating and has been somewhat successful. Jasmine Martinez denies insomnia and she shows no sign of suicidal or homicidal ideations.  Depression screen Jasmine Martinez 2/9 04/23/2016 01/02/2016 02/14/2015 06/23/2014  Decreased Interest 0 1 0 1  Down, Depressed, Hopeless 0 1 0 1  PHQ - 2 Score 0 2 0 2  Altered sleeping - 2 - 1  Tired, decreased energy - 3 - 1  Change in appetite - 3 - 0  Feeling bad or failure about yourself  - 3 - 0  Trouble concentrating - 0 - 0  Moving slowly or fidgety/restless - 0 - 0  Suicidal thoughts - 0 - 0  PHQ-9 Score - 13 - 4  Difficult doing work/chores - - - Not difficult at all      ALLERGIES: Allergies  Allergen Reactions  . Codeine Shortness Of Breath  . Hydrocodone Shortness Of Breath    Mild SOB    MEDICATIONS: Current Outpatient Medications on File Prior to Visit  Medication Sig Dispense Refill  .  ALPRAZolam (XANAX) 0.5 MG tablet Take 1 tablet (0.5 mg total) by mouth 2 (two) times daily as needed for anxiety. 30 tablet 0  . levonorgestrel (MIRENA) 20 MCG/24HR IUD 1 each by Intrauterine route once.    . Melatonin 3 MG TABS Take by mouth.    Marland Kitchen NOVOFINE 32G X 6 MM MISC INJECT AS DIRECTED once DAILY. 100 each 0   No current facility-administered medications on file prior to visit.     PAST MEDICAL HISTORY: Past Medical History:  Diagnosis Date  . Anxiety   . Back pain   . Chest pain   . Depression   . Diabetes mellitus   . Diarrhea   . Edema     feet and legs  . Gallbladder problem   . 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 Heartburn   . HTN (hypertension)    no meds now  . Hyperlipidemia   . Obesity   . Palpitations   . Parotid mass    right  . PMDD (premenstrual dysphoric disorder) 01/02/2016  . Shortness of breath   . Vitamin D deficiency   . Vitamin D deficiency     PAST SURGICAL HISTORY: Past Surgical History:  Procedure Laterality Date  . APPENDECTOMY    . CHOLECYSTECTOMY    . EUS N/A 03/23/2014   Procedure: ESOPHAGEAL ENDOSCOPIC ULTRASOUND (EUS) RADIAL;  Surgeon: Arta Silence, MD;  Location: [facility];  Service: Endoscopy;  Laterality: N/A;  . FINE NEEDLE ASPIRATION N/A 03/23/2014   Procedure: FINE NEEDLE ASPIRATION (FNA) LINEAR;  Surgeon: Arta Silence, MD;  Location: [facility];  Service: Endoscopy;  Laterality: N/A;  . PAROTIDECTOMY Right 04/02/2017   Procedure: RIGHT SUPERFICIAL PAROTIDECTOMY;  Surgeon: Jodi Marble, MD;  Location: [facility];  Service: ENT;  Laterality: Right;  . TONSILLECTOMY      SOCIAL HISTORY: Social History   Tobacco Use  . Smoking status: Never Smoker  . Smokeless tobacco: Never Used  Substance Use Topics  . Alcohol use: No  . Drug use: No    Frequency: 3.0 times per week    FAMILY HISTORY: Family History  Problem Relation Age of Onset  . Healthy Mother   . Hypertension Mother   . Hyperlipidemia Mother   . Alcoholism Mother   . Anxiety disorder Mother   . Drug abuse Mother   . Healthy Father   . Colon cancer Paternal Grandfather 12       colon    ROS: Review of Systems  Constitutional: Positive for weight loss.  Cardiovascular: Negative for chest pain.  Gastrointestinal: Negative for nausea and vomiting.  Musculoskeletal: Negative for myalgias.       Negative for muscle weakness  Endo/Heme/Allergies:       Positive for hypoglycemia  Psychiatric/Behavioral: Positive for depression. Negative for suicidal ideas. The  patient does not have insomnia.     PHYSICAL EXAM: Blood pressure 106/66, pulse (!) 52, temperature 98.1 F (36.7 C), temperature source Oral, height 5\' 5"  (1.651 m), weight 195 lb (88.5 kg), SpO2 100 %. Body mass index is 32.45 kg/m. Physical Exam  Constitutional: She is oriented to person, place, and time. She appears well-developed and well-nourished.  Cardiovascular: Normal rate.  Pulmonary/Chest: Effort normal.  Musculoskeletal: Normal range of motion.  Neurological: She is oriented to person, place, and time.  Skin: Skin is warm and dry.  Psychiatric: She has a normal mood and affect. Her behavior is normal.  Vitals reviewed.   RECENT  LABS AND TESTS: BMET    Component Value Date/Time   NA 141 07/07/2017 0825   K 4.3 07/07/2017 0825   CL 105 07/07/2017 0825   CO2 20 07/07/2017 0825   GLUCOSE 85 07/07/2017 0825   GLUCOSE 102 (H) 03/31/2017 0945   BUN 12 07/07/2017 0825   CREATININE 0.66 07/07/2017 0825   CREATININE 0.68 02/27/2017 1601   CALCIUM 9.5 07/07/2017 0825   GFRNONAA 108 07/07/2017 0825   GFRNONAA 106 02/27/2017 1601   GFRAA 124 07/07/2017 0825   GFRAA 123 02/27/2017 1601   Lab Results  Component Value Date   HGBA1C 4.6 (L) 07/07/2017   HGBA1C 4.7 (L) 01/20/2017   HGBA1C 4.9 09/25/2016   HGBA1C 5.5 04/18/2016   HGBA1C 6.3 (H) 01/02/2016   Lab Results  Component Value Date   INSULIN 8.1 07/07/2017   INSULIN 11.0 01/20/2017   INSULIN 11.7 09/25/2016   INSULIN 21.8 04/18/2016   INSULIN 15.3 01/02/2016   CBC    Component Value Date/Time   WBC 9.8 06/13/2017 1607   RBC 4.27 06/13/2017 1607   HGB 13.2 06/13/2017 1607   HGB 13.9 03/20/2017 0930   HCT 37.7 06/13/2017 1607   HCT 42.2 03/20/2017 0930   PLT 288 06/13/2017 1607   PLT 392 (H) 04/18/2016 1201   MCV 88.3 06/13/2017 1607   MCV 94 03/20/2017 0930   MCH 30.9 06/13/2017 1607   MCHC 35.0 06/13/2017 1607   RDW 12.0 06/13/2017 1607   RDW 13.2 03/20/2017 0930   LYMPHSABS 2,264 06/13/2017  1607   LYMPHSABS 1.7 03/20/2017 0930   MONOABS 0.6 05/05/2014 0902   EOSABS 88 06/13/2017 1607   EOSABS 0.1 03/20/2017 0930   BASOSABS 29 06/13/2017 1607   BASOSABS 0.0 03/20/2017 0930   Iron/TIBC/Ferritin/ %Sat No results found for: IRON, TIBC, FERRITIN, IRONPCTSAT Lipid Panel     Component Value Date/Time   CHOL 143 01/20/2017 0924   TRIG 68 01/20/2017 0924   HDL 37 (L) 01/20/2017 0924   CHOLHDL 3.4 05/05/2014 0902   VLDL 16 05/05/2014 0902   LDLCALC 92 01/20/2017 0924   Hepatic Function Panel     Component Value Date/Time   PROT 7.1 07/07/2017 0825   ALBUMIN 4.4 07/07/2017 0825   AST 11 07/07/2017 0825   ALT 13 07/07/2017 0825   ALKPHOS 92 07/07/2017 0825   BILITOT 0.4 07/07/2017 0825      Component Value Date/Time   TSH 3.180 03/20/2017 0930   TSH 3.260 09/25/2016 0811   TSH 2.090 04/18/2016 1201   Results for Feinberg, Jasmine Martinez (MRN [id number]) as of 10/09/2017 11:04  Ref. Range 07/07/2017 08:25  Vitamin D, 25-Hydroxy Latest Ref Range: 30.0 - 100.0 ng/mL 32.8   ASSESSMENT AND PLAN: Vitamin D deficiency - Plan: Vitamin D, Ergocalciferol, (DRISDOL) 50000 units CAPS capsule  Type 2 diabetes mellitus without complication, without long-term current use of insulin (Jasmine Martinez) - Plan: glucose blood (ACCU-CHEK GUIDE) test strip, TRUEPLUS LANCETS 30G MISC, Liraglutide -Weight Management (SAXENDA) 18 MG/3ML SOPN  Other hyperlipidemia - Plan: atorvastatin (LIPITOR) 10 MG tablet  Other depression - with emotional eating  - Plan: buPROPion (WELLBUTRIN SR) 200 MG 12 hr tablet  At risk for heart disease  Class 1 obesity with serious comorbidity and body mass index (BMI) of 32.0 to 32.9 in adult, unspecified obesity type  PLAN:  Diabetes II Jasmine Martinez has been given extensive diabetes education by myself today including ideal fasting and post-prandial blood glucose readings, individual ideal Hgb A1c goals and hypoglycemia prevention.  We discussed the importance of good blood sugar  control to decrease the likelihood of diabetic complications such as nephropathy, neuropathy, limb loss, blindness, coronary artery disease, and death. We discussed the importance of intensive lifestyle modification including diet, exercise and weight loss as the first line treatment for diabetes. Jasmine Martinez agrees to continue her diabetes medications and we will refill lancets and strips. Jasmine Martinez will add 8 to 12 ounces of [employer]life skim milk to help avoid post exercise hypoglycemia and she will follow up at the agreed upon time.  Hyperlipidemia Jasmine Martinez was informed of the American Heart Association Guidelines emphasizing intensive lifestyle modifications as the first line treatment for hyperlipidemia. We discussed many lifestyle modifications today in depth, and Jasmine Martinez will continue to work on decreasing saturated fats such as fatty red meat, butter and many fried foods. She will also increase vegetables and lean protein in her diet and continue to work on exercise and weight loss efforts. Jasmine Martinez agrees to continue Lipitor 10 mg qd #30 with no refills and follow up as directed.  Cardiovascular risk counseling Jasmine Martinez was given extended (15 minutes) coronary artery disease prevention counseling today. She is 47 y.o. female and has risk factors for heart disease including obesity, diabetes and hyperlipidemia. We discussed intensive lifestyle modifications today with an emphasis on specific weight loss instructions and strategies. Pt was also informed of the importance of increasing exercise and decreasing saturated fats to help prevent heart disease.  Vitamin D Deficiency Jasmine Martinez was informed that low vitamin D levels contributes to fatigue and are associated with obesity, breast, and colon cancer. She agrees to continue to take prescription Vit D @50 ,000 IU every 3 days #10 with no refills and will follow up for routine testing of vitamin D, at least 2-3 times per year. She was informed of the risk of  over-replacement of vitamin D and agrees to not increase her dose unless she discusses this with [place] first. Jasmine Martinez agrees to follow up as directed.  Depression with Emotional Eating Behaviors We discussed behavior modification techniques today to help Jasmine Martinez deal with her emotional eating and depression. She has agreed to take Wellbutrin SR 200 mg qd #30 with no refills and follow up as directed.  Obesity Jasmine Martinez is currently in the action stage of change. As such, her goal is to continue with weight loss efforts She has agreed to portion control better and make smarter food choices, such as increase vegetables and limiting simple carbohydrates  Jasmine Martinez has been instructed to work up to a goal of 150 minutes of combined cardio and strengthening exercise per week for weight loss and overall health benefits. We discussed the following Behavioral Modification Strategies today: increasing lean protein intake and decreasing simple carbohydrates We discussed various medication options to help Zamani with her weight loss efforts and we both agreed to continue Saxenda 3 mg qAM #5 pens with no refills.  Kambry has agreed to follow up with our clinic in 3 to 4 weeks. She was informed of the importance of frequent follow up visits to maximize her success with intensive lifestyle modifications for her multiple health conditions.   OBESITY BEHAVIORAL INTERVENTION VISIT  Today's visit was # 43  Starting weight: 260 lbs Starting date: 01/02/16 Today's weight : 195 lbs Today's date: 10/09/2017 Total lbs lost to date: 34   ASK: We discussed the diagnosis of obesity with [name] today and Harleyquinn agreed to give [place] permission to discuss obesity behavioral modification therapy today.  ASSESS: Jerianne has the  diagnosis of obesity and her BMI today is 32.45 Nimra is in the action stage of change   ADVISE: Kerin was educated on the multiple health risks of obesity as well as the  benefit of weight loss to improve her health. She was advised of the need for long term treatment and the importance of lifestyle modifications to improve her current health and to decrease her risk of future health problems.  AGREE: Multiple dietary modification options and treatment options were discussed and  Torie agreed to follow the recommendations documented in the above note.  ARRANGE: Izzah was educated on the importance of frequent visits to treat obesity as outlined per CMS and USPSTF guidelines and agreed to schedule her next follow up appointment today.  I, Doreene Nest, am acting as transcriptionist for Dennard Nip, MD  I have reviewed the above documentation for accuracy and completeness, and I agree with the above. -Dennard Nip, MD

## 2017-10-14 DIAGNOSIS — R52 Pain, unspecified: Secondary | ICD-10-CM | POA: Diagnosis not present

## 2017-10-14 DIAGNOSIS — F411 Generalized anxiety disorder: Secondary | ICD-10-CM | POA: Diagnosis not present

## 2017-10-15 DIAGNOSIS — M2550 Pain in unspecified joint: Secondary | ICD-10-CM | POA: Diagnosis not present

## 2017-10-21 DIAGNOSIS — F411 Generalized anxiety disorder: Secondary | ICD-10-CM | POA: Diagnosis not present

## 2017-10-22 ENCOUNTER — Encounter: Payer: Self-pay | Admitting: Internal Medicine

## 2017-10-22 DIAGNOSIS — E785 Hyperlipidemia, unspecified: Secondary | ICD-10-CM | POA: Diagnosis not present

## 2017-10-22 DIAGNOSIS — Z79899 Other long term (current) drug therapy: Secondary | ICD-10-CM | POA: Diagnosis not present

## 2017-10-22 DIAGNOSIS — C772 Secondary and unspecified malignant neoplasm of intra-abdominal lymph nodes: Secondary | ICD-10-CM | POA: Diagnosis not present

## 2017-10-22 DIAGNOSIS — I898 Other specified noninfective disorders of lymphatic vessels and lymph nodes: Secondary | ICD-10-CM | POA: Diagnosis not present

## 2017-10-22 DIAGNOSIS — K3189 Other diseases of stomach and duodenum: Secondary | ICD-10-CM | POA: Diagnosis not present

## 2017-10-22 DIAGNOSIS — R933 Abnormal findings on diagnostic imaging of other parts of digestive tract: Secondary | ICD-10-CM | POA: Diagnosis not present

## 2017-10-22 DIAGNOSIS — D481 Neoplasm of uncertain behavior of connective and other soft tissue: Secondary | ICD-10-CM | POA: Diagnosis not present

## 2017-10-22 DIAGNOSIS — E559 Vitamin D deficiency, unspecified: Secondary | ICD-10-CM | POA: Diagnosis not present

## 2017-10-22 DIAGNOSIS — I10 Essential (primary) hypertension: Secondary | ICD-10-CM | POA: Diagnosis not present

## 2017-10-22 DIAGNOSIS — E119 Type 2 diabetes mellitus without complications: Secondary | ICD-10-CM | POA: Diagnosis not present

## 2017-10-22 DIAGNOSIS — I899 Noninfective disorder of lymphatic vessels and lymph nodes, unspecified: Secondary | ICD-10-CM | POA: Diagnosis not present

## 2017-10-22 DIAGNOSIS — C49A2 Gastrointestinal stromal tumor of stomach: Secondary | ICD-10-CM | POA: Diagnosis not present

## 2017-10-22 MED FILL — ACCU-CHEK GUIDE STRP: 90 days supply | Qty: 100 | Fill #0

## 2017-10-22 MED FILL — SAXENDA 18 MG/3 ML PEN: 18 | 30 days supply | Qty: 15 | Fill #0

## 2017-10-22 MED FILL — ACCU-CHEK FASTCLIX LANCETS: 90 days supply | Qty: 102 | Fill #0

## 2017-10-22 MED FILL — BUPROPION HCL SR 200 MG TAB: 200 | 30 days supply | Qty: 30 | Fill #0

## 2017-10-28 DIAGNOSIS — F411 Generalized anxiety disorder: Secondary | ICD-10-CM | POA: Diagnosis not present

## 2017-10-29 ENCOUNTER — Ambulatory Visit (INDEPENDENT_AMBULATORY_CARE_PROVIDER_SITE_OTHER): Payer: 59 | Admitting: Family Medicine

## 2017-10-29 VITALS — BP 125/79 | HR 89 | Temp 97.8°F | Ht 65.0 in | Wt 203.0 lb

## 2017-10-29 DIAGNOSIS — Z6833 Body mass index (BMI) 33.0-33.9, adult: Secondary | ICD-10-CM | POA: Diagnosis not present

## 2017-10-29 DIAGNOSIS — E669 Obesity, unspecified: Secondary | ICD-10-CM | POA: Diagnosis not present

## 2017-10-29 DIAGNOSIS — C49A2 Gastrointestinal stromal tumor of stomach: Secondary | ICD-10-CM

## 2017-10-30 NOTE — Progress Notes (Signed)
Office: [phone]  /  Fax: [phone]   HPI:   Chief Complaint: OBESITY Jasmine Martinez is here to discuss her progress with her obesity treatment plan. She is on the portion control better and make smarter food choices, such as increase vegetables and decrease simple carbohydrates and is following her eating plan approximately 0 % of the time. She states that she is working with a Clinical research associate for 60 minutes 4 times Jasmine week. 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 She recognizes that she is eating emotionally and accepts this behavior as a temporary coping mechanism.  Her weight is 203 lb (92.1 kg) today and has had a weight gain of 8 pounds in 3 weeks since her last visit. She has lost 57 lbs since starting treatment with [place].  Gastric Stromal Sarcoma Additional testing done on her stomach mass was done this week and Jasmine Martinez was told her tumor on the greater curvature of her stomach appeared to be growing and may be starting to involve the esophagus. Also, a lymph node on the lesser curvature appears to be involved. Jasmine Martinez is seeing her oncology surgeon at [facility] today to look at treatment options. This will likely include chemotherapy and surgery, but the extent of surgery is unknown.  ALLERGIES: Allergies  Allergen Reactions  . Codeine Shortness Of Breath  . Hydrocodone Shortness Of Breath    Mild SOB    MEDICATIONS: Current Outpatient Medications on File Prior to Visit  Medication Sig Dispense Refill  . ALPRAZolam (XANAX) 0.5 MG tablet Take 1 tablet (0.5 mg total) by mouth 2 (two) times daily as needed for anxiety. 30 tablet 0  . atorvastatin (LIPITOR) 10 MG tablet Take 1 tablet (10 mg total) by mouth daily. 30 tablet 0  . buPROPion (WELLBUTRIN SR) 200 MG 12 hr tablet Take 1 tablet (200 mg total) by mouth daily. 30 tablet 0  . glucose blood (ACCU-CHEK GUIDE) test strip Use as instructed 100  each 0  . levonorgestrel (MIRENA) 20 MCG/24HR IUD 1 each by Intrauterine route once.    . Liraglutide -Weight Management (SAXENDA) 18 MG/3ML SOPN Inject 3 mg into the skin daily. 5 pen 0  . Melatonin 3 MG TABS Take by mouth.    Marland Kitchen NOVOFINE 32G X 6 MM MISC INJECT AS DIRECTED once DAILY. 100 each 0  . TRUEPLUS LANCETS 30G MISC Inject 1 Stick as directed daily. 100 each 0  . Vitamin D, Ergocalciferol, (DRISDOL) 50000 units CAPS capsule Take 1 capsule (50,000 Units total) by mouth every 3 (three) days. 10 capsule 0   No current facility-administered medications on file prior to visit.     PAST MEDICAL HISTORY: Past Medical History:  Diagnosis Date  . Anxiety   . Back pain   . Chest pain   . Depression   . Diabetes mellitus   . Diarrhea   . Edema    feet and legs  . Gallbladder problem   . Gastrointestinal stromal tumor (GIST) ([place])    removed 2016  . Heartburn   . HTN (hypertension)    no meds now  . Hyperlipidemia   . Obesity   . Palpitations   . Parotid mass    right  . PMDD (premenstrual dysphoric disorder) 01/02/2016  . Shortness of breath   . Vitamin D deficiency   . Vitamin D deficiency     PAST SURGICAL HISTORY: Past Surgical History:  Procedure Laterality Date  .  APPENDECTOMY    . CHOLECYSTECTOMY    . EUS N/A 03/23/2014   Procedure: ESOPHAGEAL ENDOSCOPIC ULTRASOUND (EUS) RADIAL;  Surgeon: Arta Silence, MD;  Location: [facility];  Service: Endoscopy;  Laterality: N/A;  . FINE NEEDLE ASPIRATION N/A 03/23/2014   Procedure: FINE NEEDLE ASPIRATION (FNA) LINEAR;  Surgeon: Arta Silence, MD;  Location: [facility];  Service: Endoscopy;  Laterality: N/A;  . PAROTIDECTOMY Right 04/02/2017   Procedure: RIGHT SUPERFICIAL PAROTIDECTOMY;  Surgeon: Jodi Marble, MD;  Location: [place];  Service: ENT;  Laterality: Right;  . TONSILLECTOMY      SOCIAL HISTORY: Social History   Tobacco Use  . Smoking status: Never Smoker  . Smokeless tobacco: Never Used   Substance Use Topics  . Alcohol use: No  . Drug use: No    Frequency: 3.0 times Jasmine week    FAMILY HISTORY: Family History  Problem Relation Age of Onset  . Healthy Mother   . Hypertension Mother   . Hyperlipidemia Mother   . Alcoholism Mother   . Anxiety disorder Mother   . Drug abuse Mother   . Healthy Father   . Colon cancer Paternal Grandfather 23       colon    ROS: Review of Systems  Constitutional: Negative for weight loss.    PHYSICAL EXAM: Blood pressure 125/79, pulse 89, temperature 97.8 F (36.6 C), temperature source Oral, height 5\' 5"  (1.651 m), weight 203 lb (92.1 kg), SpO2 98 %. Body mass index is 33.78 kg/m. Physical Exam  Constitutional: She is oriented to person, place, and time. She appears well-developed and well-nourished.  Cardiovascular: Normal rate.  Pulmonary/Chest: Effort normal.  Musculoskeletal: Normal range of motion.  Neurological: She is oriented to person, place, and time.  Skin: Skin is warm and dry.  Psychiatric: She has a normal mood and affect. Her behavior is normal.  Vitals reviewed.   RECENT LABS AND TESTS: BMET    Component Value Date/Time   NA 141 07/07/2017 0825   K 4.3 07/07/2017 0825   CL 105 07/07/2017 0825   CO2 20 07/07/2017 0825   GLUCOSE 85 07/07/2017 0825   GLUCOSE 102 (H) 03/31/2017 0945   BUN 12 07/07/2017 0825   CREATININE 0.66 07/07/2017 0825   CREATININE 0.68 02/27/2017 1601   CALCIUM 9.5 07/07/2017 0825   GFRNONAA 108 07/07/2017 0825   GFRNONAA 106 02/27/2017 1601   GFRAA 124 07/07/2017 0825   GFRAA 123 02/27/2017 1601   Lab Results  Component Value Date   HGBA1C 4.6 (L) 07/07/2017   HGBA1C 4.7 (L) 01/20/2017   HGBA1C 4.9 09/25/2016   HGBA1C 5.5 04/18/2016   HGBA1C 6.3 (H) 01/02/2016   Lab Results  Component Value Date   INSULIN 8.1 07/07/2017   INSULIN 11.0 01/20/2017   INSULIN 11.7 09/25/2016   INSULIN 21.8 04/18/2016   INSULIN 15.3 01/02/2016   CBC    Component Value Date/Time    WBC 9.8 06/13/2017 1607   RBC 4.27 06/13/2017 1607   HGB 13.2 06/13/2017 1607   HGB 13.9 03/20/2017 0930   HCT 37.7 06/13/2017 1607   HCT 42.2 03/20/2017 0930   PLT 288 06/13/2017 1607   PLT 392 (H) 04/18/2016 1201   MCV 88.3 06/13/2017 1607   MCV 94 03/20/2017 0930   MCH 30.9 06/13/2017 1607   MCHC 35.0 06/13/2017 1607   RDW 12.0 06/13/2017 1607   RDW 13.2 03/20/2017 0930   LYMPHSABS 2,264 06/13/2017 1607   LYMPHSABS 1.7 03/20/2017 0930   MONOABS  0.6 05/05/2014 0902   EOSABS 88 06/13/2017 1607   EOSABS 0.1 03/20/2017 0930   BASOSABS 29 06/13/2017 1607   BASOSABS 0.0 03/20/2017 0930   Iron/TIBC/Ferritin/ %Sat No results found for: IRON, TIBC, FERRITIN, IRONPCTSAT Lipid Panel     Component Value Date/Time   CHOL 143 01/20/2017 0924   TRIG 68 01/20/2017 0924   HDL 37 (L) 01/20/2017 0924   CHOLHDL 3.4 05/05/2014 0902   VLDL 16 05/05/2014 0902   LDLCALC 92 01/20/2017 0924   Hepatic Function Panel     Component Value Date/Time   PROT 7.1 07/07/2017 0825   ALBUMIN 4.4 07/07/2017 0825   AST 11 07/07/2017 0825   ALT 13 07/07/2017 0825   ALKPHOS 92 07/07/2017 0825   BILITOT 0.4 07/07/2017 0825      Component Value Date/Time   TSH 3.180 03/20/2017 0930   TSH 3.260 09/25/2016 0811   TSH 2.090 04/18/2016 1201   Results for Jasmine, Martinez (MRN [id number]) as of 10/30/2017 13:03  Ref. Range 07/07/2017 08:25  Vitamin D, 25-Hydroxy Latest Ref Range: 30.0 - 100.0 ng/mL 32.8   ASSESSMENT AND PLAN:  Malignant gastric stromal tumor (HCC)  Class 1 obesity with serious comorbidity and body mass index (BMI) of 33.0 to 33.9 in adult, unspecified obesity type  PLAN:  Gastric Stromal Sarcoma Comfort and support were offered to Jasmine Martinez and we discussed emphasizing healthy eating, such as increasing antioxidant rich vegetables and stop eating all smoked and processed meats. We will continue to monitor and Jasmine Martinez was given my cell phone number with any questions or  concerns.  I spent > than 50% of the 25 minute visit on counseling as documented in the note.  Obesity Jasmine Martinez is currently in the action stage of change. As such, her goal is to maintain weight for now. She has agreed to portion control better and make smarter food choices. We discussed changing our approach to working on healthy eating and worry about weight loss. Her goal will be to maintain at this time. Jasmine Martinez has been instructed to work up to a goal of 150 minutes of combined cardio and strengthening exercise Jasmine week for weight loss and overall health benefits. We discussed the following Behavioral Modification Strategies today: increasing vegetables, increase H2O intake, keeping healthy foods in the home, and emotional eating strategies.  Jasmine Martinez has agreed to follow up with our clinic in 2 weeks. She was informed of the importance of frequent follow up visits to maximize her success with intensive lifestyle modifications for her multiple health conditions.   OBESITY BEHAVIORAL INTERVENTION VISIT  Today's visit was # 44   Starting weight: 260 lbs Starting date: 01/02/16 Today's weight : Weight: 203 lb (92.1 kg)  Today's date: 10/29/2017 Total lbs lost to date: 38  ASK: We discussed the diagnosis of obesity with Jasmine Martinez today and Navayah agreed to give [place] permission to discuss obesity behavioral modification therapy today.  ASSESS: Jasmine Martinez has the diagnosis of obesity and her BMI today is 33.78. Jasmine Martinez is in the action stage of change.   ADVISE: Jasmine Martinez was educated on the multiple health risks of obesity as well as the benefit of weight loss to improve her health. She was advised of the need for long term treatment and the importance of lifestyle modifications to improve her current health and to decrease her risk of future health problems.  AGREE: Multiple dietary modification options and treatment options were discussed and Jasmine Martinez agreed to follow  the recommendations documented in  the above note.  ARRANGE: Jasmine Martinez was educated on the importance of frequent visits to treat obesity as outlined Jasmine CMS and USPSTF guidelines and agreed to schedule her next follow up appointment today.  I, Marcille Blanco, am acting as transcriptionist for Starlyn Skeans, MD  I have reviewed the above documentation for accuracy and completeness, and I agree with the above. -Dennard Nip, MD

## 2017-11-04 ENCOUNTER — Encounter: Payer: Self-pay | Admitting: Internal Medicine

## 2017-11-04 DIAGNOSIS — I898 Other specified noninfective disorders of lymphatic vessels and lymph nodes: Secondary | ICD-10-CM | POA: Diagnosis not present

## 2017-11-04 DIAGNOSIS — E119 Type 2 diabetes mellitus without complications: Secondary | ICD-10-CM | POA: Diagnosis not present

## 2017-11-04 DIAGNOSIS — Z79899 Other long term (current) drug therapy: Secondary | ICD-10-CM | POA: Diagnosis not present

## 2017-11-04 DIAGNOSIS — Z885 Allergy status to narcotic agent status: Secondary | ICD-10-CM | POA: Diagnosis not present

## 2017-11-04 DIAGNOSIS — I899 Noninfective disorder of lymphatic vessels and lymph nodes, unspecified: Secondary | ICD-10-CM | POA: Diagnosis not present

## 2017-11-04 DIAGNOSIS — F329 Major depressive disorder, single episode, unspecified: Secondary | ICD-10-CM | POA: Diagnosis not present

## 2017-11-04 DIAGNOSIS — I10 Essential (primary) hypertension: Secondary | ICD-10-CM | POA: Diagnosis not present

## 2017-11-04 DIAGNOSIS — R935 Abnormal findings on diagnostic imaging of other abdominal regions, including retroperitoneum: Secondary | ICD-10-CM | POA: Diagnosis not present

## 2017-11-04 DIAGNOSIS — C49A0 Gastrointestinal stromal tumor, unspecified site: Secondary | ICD-10-CM | POA: Diagnosis not present

## 2017-11-04 DIAGNOSIS — F419 Anxiety disorder, unspecified: Secondary | ICD-10-CM | POA: Diagnosis not present

## 2017-11-04 DIAGNOSIS — C49A2 Gastrointestinal stromal tumor of stomach: Secondary | ICD-10-CM | POA: Diagnosis not present

## 2017-11-11 DIAGNOSIS — F411 Generalized anxiety disorder: Secondary | ICD-10-CM | POA: Diagnosis not present

## 2017-11-17 ENCOUNTER — Telehealth: Payer: Self-pay | Admitting: Emergency Medicine

## 2017-11-17 ENCOUNTER — Telehealth: Payer: Self-pay | Admitting: Internal Medicine

## 2017-11-17 DIAGNOSIS — Z7184 Encounter for health counseling related to travel: Secondary | ICD-10-CM

## 2017-11-17 MED ORDER — ALPRAZOLAM 0.5 MG PO TABS: ORAL_TABLET | ORAL | 0 refills | Status: DC

## 2017-11-17 MED FILL — ALPRAZolam 0.5 MG TABS: 0.5 | 30 days supply | Qty: 180 | Fill #0

## 2017-11-17 NOTE — Telephone Encounter (Signed)
Have called in Xanax to take for anxiety regarding sugery forGIST tumor.

## 2017-11-17 NOTE — Telephone Encounter (Signed)
Pt called and stated she is having surgery to remove a tumor from her stomach on Friday. She is having a lot of anxiety and wants to know if you can call her in some xanax Pharmacy is [facility]. Thanks.

## 2017-11-18 DIAGNOSIS — F411 Generalized anxiety disorder: Secondary | ICD-10-CM | POA: Diagnosis not present

## 2017-11-20 ENCOUNTER — Ambulatory Visit (INDEPENDENT_AMBULATORY_CARE_PROVIDER_SITE_OTHER): Payer: 59 | Admitting: Family Medicine

## 2017-11-20 VITALS — BP 128/83 | HR 72 | Temp 98.3°F | Ht 65.0 in | Wt 205.0 lb

## 2017-11-20 DIAGNOSIS — E669 Obesity, unspecified: Secondary | ICD-10-CM

## 2017-11-20 DIAGNOSIS — E559 Vitamin D deficiency, unspecified: Secondary | ICD-10-CM

## 2017-11-20 DIAGNOSIS — E7849 Other hyperlipidemia: Secondary | ICD-10-CM

## 2017-11-20 DIAGNOSIS — Z9189 Other specified personal risk factors, not elsewhere classified: Secondary | ICD-10-CM

## 2017-11-20 DIAGNOSIS — Z6834 Body mass index (BMI) 34.0-34.9, adult: Secondary | ICD-10-CM | POA: Diagnosis not present

## 2017-11-20 DIAGNOSIS — F3289 Other specified depressive episodes: Secondary | ICD-10-CM

## 2017-11-20 MED ORDER — VITAMIN D (ERGOCALCIFEROL) 1.25 MG (50000 UNIT) PO CAPS: 50000.0000 [IU] | ORAL_CAPSULE | ORAL | 2 refills | Status: DC

## 2017-11-20 MED ORDER — ATORVASTATIN CALCIUM 10 MG PO TABS: 10.0000 mg | ORAL_TABLET | Freq: Every day | ORAL | 2 refills | Status: DC

## 2017-11-20 MED ORDER — BUPROPION HCL ER (SR) 200 MG PO TB12: 200.0000 mg | ORAL_TABLET | Freq: Every day | ORAL | 2 refills | Status: DC

## 2017-11-20 MED FILL — VIT D2 1.25 MG (50,000 UNIT: 1.25 MG | 30 days supply | Qty: 10 | Fill #0

## 2017-11-20 MED FILL — BUPROPION HCL SR 200 MG TAB: 200 | 30 days supply | Qty: 30 | Fill #0

## 2017-11-20 MED FILL — ATORVASTATIN 10 MG TABLET: 10 | 30 days supply | Qty: 30 | Fill #0

## 2017-11-21 MED FILL — SAXENDA 18 MG/3 ML PEN: 18 | 30 days supply | Qty: 15 | Fill #0

## 2017-11-24 DIAGNOSIS — Z8719 Personal history of other diseases of the digestive system: Secondary | ICD-10-CM | POA: Diagnosis not present

## 2017-11-24 DIAGNOSIS — C49A2 Gastrointestinal stromal tumor of stomach: Secondary | ICD-10-CM | POA: Diagnosis not present

## 2017-11-24 DIAGNOSIS — C49A9 Gastrointestinal stromal tumor of other sites: Secondary | ICD-10-CM | POA: Diagnosis not present

## 2017-11-24 DIAGNOSIS — E785 Hyperlipidemia, unspecified: Secondary | ICD-10-CM | POA: Diagnosis not present

## 2017-11-24 DIAGNOSIS — Z9049 Acquired absence of other specified parts of digestive tract: Secondary | ICD-10-CM | POA: Diagnosis not present

## 2017-11-24 DIAGNOSIS — E559 Vitamin D deficiency, unspecified: Secondary | ICD-10-CM | POA: Diagnosis not present

## 2017-11-24 DIAGNOSIS — C772 Secondary and unspecified malignant neoplasm of intra-abdominal lymph nodes: Secondary | ICD-10-CM | POA: Diagnosis not present

## 2017-11-24 DIAGNOSIS — E119 Type 2 diabetes mellitus without complications: Secondary | ICD-10-CM | POA: Diagnosis not present

## 2017-11-24 DIAGNOSIS — I10 Essential (primary) hypertension: Secondary | ICD-10-CM | POA: Diagnosis not present

## 2017-11-24 NOTE — Progress Notes (Signed)
Office: [phone]  /  Fax: [phone]   HPI:   Chief Complaint: OBESITY Jasmine Martinez is here to discuss her progress with her obesity treatment plan. She is on the portion control better and make smarter food choices, such as increase vegetables and decrease simple carbohydrates and is following her eating plan approximately 75 % of the time. She states she is working with a Physiological scientist 60 minutes 3 times per week. Jasmine Martinez is getting ready to have a gastric stromal tumor resection and may be out of work for 2 months. She has done very well with her nutrition and overall weight loss, but she notes some increased emotional eating after her cancer diagnosis.  Her weight is 205 lb (93 kg) today and has had a weight gain of 2 pounds over a period of 3 weeks since her last visit. She has lost 55 lbs since starting treatment with [place].  Hyperlipidemia Jasmine Martinez has hyperlipidemia and has been trying to improve her cholesterol levels with intensive lifestyle modification including a low saturated fat diet, exercise and weight loss. She denies any chest pain or myalgias.  At risk for cardiovascular disease Jasmine Martinez is at a higher than average risk for cardiovascular disease due to hyperlipidemia and obesity. She currently denies any chest pain.  Depression with emotional eating behaviors Jasmine Martinez is struggling with emotional eating and using food for comfort to the extent that it is negatively impacting her health. Her mood is stable overall, with the same increased anxiety, which is expected before surgery and with her diagnosis. She denies side effects with Wellbutrin. She shows no sign of suicidal or homicidal ideations.  Vitamin D deficiency Jasmine Martinez has a diagnosis of vitamin D deficiency. She is currently taking vit D and denies nausea, vomiting, or muscle weakness.  ALLERGIES: Allergies  Allergen Reactions  . Codeine Shortness Of Breath  . Hydrocodone Shortness Of Breath    Mild SOB      MEDICATIONS: Current Outpatient Medications on File Prior to Visit  Medication Sig Dispense Refill  . ALPRAZolam (XANAX) 0.5 MG tablet One or 2 tablets po 3 times daily as needed for anxiety 180 tablet 0  . glucose blood (ACCU-CHEK GUIDE) test strip Use as instructed 100 each 0  . levonorgestrel (MIRENA) 20 MCG/24HR IUD 1 each by Intrauterine route once.    . Liraglutide -Weight Management (SAXENDA) 18 MG/3ML SOPN Inject 3 mg into the skin daily. 5 pen 0  . Melatonin 3 MG TABS Take by mouth.    Marland Kitchen NOVOFINE 32G X 6 MM MISC INJECT AS DIRECTED once DAILY. 100 each 0  . TRUEPLUS LANCETS 30G MISC Inject 1 Stick as directed daily. 100 each 0   No current facility-administered medications on file prior to visit.     PAST MEDICAL HISTORY: Past Medical History:  Diagnosis Date  . Anxiety   . Back pain   . Chest pain   . Depression   . Diabetes mellitus   . Diarrhea   . Edema    feet and legs  . Gallbladder problem   . Gastrointestinal stromal tumor (GIST) ([place])    removed 2016  . Heartburn   . HTN (hypertension)    no meds now  . Hyperlipidemia   . Obesity   . Palpitations   . Parotid mass    right  . PMDD (premenstrual dysphoric disorder) 01/02/2016  . Shortness of breath   . Vitamin D deficiency   . Vitamin D deficiency     PAST SURGICAL  HISTORY: Past Surgical History:  Procedure Laterality Date  . APPENDECTOMY    . CHOLECYSTECTOMY    . EUS N/A 03/23/2014   Procedure: ESOPHAGEAL ENDOSCOPIC ULTRASOUND (EUS) RADIAL;  Surgeon: Arta Silence, MD;  Location: [facility];  Service: Endoscopy;  Laterality: N/A;  . FINE NEEDLE ASPIRATION N/A 03/23/2014   Procedure: FINE NEEDLE ASPIRATION (FNA) LINEAR;  Surgeon: Arta Silence, MD;  Location: [facility];  Service: Endoscopy;  Laterality: N/A;  . PAROTIDECTOMY Right 04/02/2017   Procedure: RIGHT SUPERFICIAL PAROTIDECTOMY;  Surgeon: Jodi Marble, MD;  Location: [place];  Service: ENT;  Laterality: Right;  .  TONSILLECTOMY      SOCIAL HISTORY: Social History   Tobacco Use  . Smoking status: Never Smoker  . Smokeless tobacco: Never Used  Substance Use Topics  . Alcohol use: No  . Drug use: No    Frequency: 3.0 times per week    FAMILY HISTORY: Family History  Problem Relation Age of Onset  . Healthy Mother   . Hypertension Mother   . Hyperlipidemia Mother   . Alcoholism Mother   . Anxiety disorder Mother   . Drug abuse Mother   . Healthy Father   . Colon cancer Paternal Grandfather 78       colon    ROS: Review of Systems  Constitutional: Negative for weight loss.  Cardiovascular: Negative for chest pain.  Gastrointestinal: Negative for nausea and vomiting.  Musculoskeletal: Negative for myalgias.       Negative for muscle weakness.  Psychiatric/Behavioral: Positive for depression. Negative for suicidal ideas.       Negative for homicidal ideations.    PHYSICAL EXAM: Blood pressure 128/83, pulse 72, temperature 98.3 F (36.8 C), temperature source Oral, height 5\' 5"  (1.651 m), weight 205 lb (93 kg), SpO2 100 %. Body mass index is 34.11 kg/m. Physical Exam  Constitutional: She is oriented to person, place, and time. She appears well-developed and well-nourished.  Cardiovascular: Normal rate.  Pulmonary/Chest: Effort normal.  Musculoskeletal: Normal range of motion.  Neurological: She is oriented to person, place, and time.  Skin: Skin is warm and dry.  Psychiatric: She has a normal mood and affect. Her behavior is normal.  Vitals reviewed.   RECENT LABS AND TESTS: BMET    Component Value Date/Time   NA 141 07/07/2017 0825   K 4.3 07/07/2017 0825   CL 105 07/07/2017 0825   CO2 20 07/07/2017 0825   GLUCOSE 85 07/07/2017 0825   GLUCOSE 102 (H) 03/31/2017 0945   BUN 12 07/07/2017 0825   CREATININE 0.66 07/07/2017 0825   CREATININE 0.68 02/27/2017 1601   CALCIUM 9.5 07/07/2017 0825   GFRNONAA 108 07/07/2017 0825   GFRNONAA 106 02/27/2017 1601   GFRAA 124  07/07/2017 0825   GFRAA 123 02/27/2017 1601   Lab Results  Component Value Date   HGBA1C 4.6 (L) 07/07/2017   HGBA1C 4.7 (L) 01/20/2017   HGBA1C 4.9 09/25/2016   HGBA1C 5.5 04/18/2016   HGBA1C 6.3 (H) 01/02/2016   Lab Results  Component Value Date   INSULIN 8.1 07/07/2017   INSULIN 11.0 01/20/2017   INSULIN 11.7 09/25/2016   INSULIN 21.8 04/18/2016   INSULIN 15.3 01/02/2016   CBC    Component Value Date/Time   WBC 9.8 06/13/2017 1607   RBC 4.27 06/13/2017 1607   HGB 13.2 06/13/2017 1607   HGB 13.9 03/20/2017 0930   HCT 37.7 06/13/2017 1607   HCT 42.2 03/20/2017 0930   PLT 288 06/13/2017 1607  PLT 392 (H) 04/18/2016 1201   MCV 88.3 06/13/2017 1607   MCV 94 03/20/2017 0930   MCH 30.9 06/13/2017 1607   MCHC 35.0 06/13/2017 1607   RDW 12.0 06/13/2017 1607   RDW 13.2 03/20/2017 0930   LYMPHSABS 2,264 06/13/2017 1607   LYMPHSABS 1.7 03/20/2017 0930   MONOABS 0.6 05/05/2014 0902   EOSABS 88 06/13/2017 1607   EOSABS 0.1 03/20/2017 0930   BASOSABS 29 06/13/2017 1607   BASOSABS 0.0 03/20/2017 0930   Iron/TIBC/Ferritin/ %Sat No results found for: IRON, TIBC, FERRITIN, IRONPCTSAT Lipid Panel     Component Value Date/Time   CHOL 143 01/20/2017 0924   TRIG 68 01/20/2017 0924   HDL 37 (L) 01/20/2017 0924   CHOLHDL 3.4 05/05/2014 0902   VLDL 16 05/05/2014 0902   LDLCALC 92 01/20/2017 0924   Hepatic Function Panel     Component Value Date/Time   PROT 7.1 07/07/2017 0825   ALBUMIN 4.4 07/07/2017 0825   AST 11 07/07/2017 0825   ALT 13 07/07/2017 0825   ALKPHOS 92 07/07/2017 0825   BILITOT 0.4 07/07/2017 0825      Component Value Date/Time   TSH 3.180 03/20/2017 0930   TSH 3.260 09/25/2016 0811   TSH 2.090 04/18/2016 1201   Results for Jasmine Martinez, Jasmine Martinez (MRN [id number]) as of 11/24/2017 06:18  Ref. Range 07/07/2017 08:25  Vitamin D, 25-Hydroxy Latest Ref Range: 30.0 - 100.0 ng/mL 32.8   ASSESSMENT AND PLAN: Other hyperlipidemia - Plan: atorvastatin (LIPITOR)  10 MG tablet  Vitamin D deficiency - Plan: Vitamin D, Ergocalciferol, (DRISDOL) 1.25 MG (50000 UT) CAPS capsule  Other depression - with emotional eating  - Plan: buPROPion (WELLBUTRIN SR) 200 MG 12 hr tablet  At risk for heart disease  Class 1 obesity with serious comorbidity and body mass index (BMI) of 34.0 to 34.9 in adult, unspecified obesity type  PLAN:  Hyperlipidemia Jasmine Martinez was informed of the American Heart Association Guidelines emphasizing intensive lifestyle modifications as the first line treatment for hyperlipidemia. We discussed many lifestyle modifications today in depth, and Jasmine Martinez will continue to work on decreasing saturated fats such as fatty red meat, butter and many fried foods. She will also increase vegetables and lean protein in her diet and continue to work on exercise and weight loss efforts. Jasmine Martinez agrees to continue taking lipitor 10mg  #30 with 1 refill and she will follow up in 2 months.  Cardiovascular risk counseling Jasmine Martinez was given extended (15 minutes) coronary artery disease prevention counseling today. She is 47 y.o. female and has risk factors for heart disease including hyperlipidemia and obesity. We discussed intensive lifestyle modifications today with an emphasis on specific weight loss instructions and strategies. Pt was also informed of the importance of increasing exercise and decreasing saturated fats to help prevent heart disease.  Depression with Emotional Eating Behaviors We discussed behavior modification techniques today to help Jasmine Martinez deal with her emotional eating and depression. She has agreed to continue to take Wellbutrin SR 200mg  qd #30 with 1 refill and she agreed to follow up as directed.  Vitamin D Deficiency Jasmine Martinez was informed that low vitamin D levels contributes to fatigue and are associated with obesity, breast, and colon cancer. She agrees to continue to take prescription Vit D @50 ,000 IU every week #4 with 1 refill  and will follow up for routine testing of vitamin D, at least 2-3 times per year. She was informed of the risk of over-replacement of vitamin D and agrees to not increase her  dose unless she discusses this with [place] first. Jasmine Martinez agrees to follow up in 2 months.  Obesity Jasmine Martinez is currently in the action stage of change. As such, her goal is to continue with weight loss efforts. She has agreed to portion control better and make smarter food choices, such as increase vegetables and decrease simple carbohydrates. Jasmine Martinez has been instructed to work up to a goal of 150 minutes of combined cardio and strengthening exercise per week for weight loss and overall health benefits. Jasmine Martinez will follow her diet per her surgeon, likely liquids for the first few days at least. She was reassured that I was available via my chart for the if she has any questions while recovering from surgery.  Jasmine Martinez has agreed to follow up with our clinic in 2 months. She was informed of the importance of frequent follow up visits to maximize her success with intensive lifestyle modifications for her multiple health conditions.   OBESITY BEHAVIORAL INTERVENTION VISIT  Today's visit was # 45  Starting weight: 260 lbs Starting date: 01/02/16 Today's weight : Weight: 205 lb (93 kg)  Today's date: 11/20/2017 Total lbs lost to date: 84  ASK: We discussed the diagnosis of obesity with Jasmine Martinez today and Jasmine Martinez agreed to give [place] permission to discuss obesity behavioral modification therapy today.  ASSESS: Jasmine Martinez has the diagnosis of obesity and her BMI today is 34.76. Jasmine Martinez is in the action stage of change.   ADVISE: Jasmine Martinez was educated on the multiple health risks of obesity as well as the benefit of weight loss to improve her health. She was advised of the need for long term treatment and the importance of lifestyle modifications to improve her current health and to decrease her risk of future  health problems.  AGREE: Multiple dietary modification options and treatment options were discussed and Fairy agreed to follow the recommendations documented in the above note.  ARRANGE: Adonai was educated on the importance of frequent visits to treat obesity as outlined per CMS and USPSTF guidelines and agreed to schedule her next follow up appointment today.  I, Marcille Blanco, am acting as transcriptionist for Starlyn Skeans, MD  I have reviewed the above documentation for accuracy and completeness, and I agree with the above. -Dennard Nip, MD

## 2017-11-25 MED ORDER — ENOXAPARIN SODIUM 40 MG/0.4ML ~~LOC~~ SOLN
40.00 | SUBCUTANEOUS | Status: DC
Start: 2017-11-26 — End: 2017-11-25

## 2017-11-25 MED ORDER — ONDANSETRON 4 MG PO TBDP
4.00 | ORAL_TABLET | ORAL | Status: DC
Start: ? — End: 2017-11-25

## 2017-11-25 MED ORDER — ACETAMINOPHEN 325 MG PO TABS
975.00 | ORAL_TABLET | ORAL | Status: DC
Start: 2017-11-25 — End: 2017-11-25

## 2017-11-25 MED ORDER — EXENATIDE 5 MCG/0.02ML ~~LOC~~ SOPN
5.00 | PEN_INJECTOR | SUBCUTANEOUS | Status: DC
Start: 2017-11-25 — End: 2017-11-25

## 2017-11-25 MED ORDER — TRAMADOL HCL 50 MG PO TABS
50.00 | ORAL_TABLET | ORAL | Status: DC
Start: ? — End: 2017-11-25

## 2017-11-25 MED ORDER — GENERIC EXTERNAL MEDICATION
6.25 | Status: DC
Start: ? — End: 2017-11-25

## 2017-11-25 MED ORDER — BUPROPION HCL ER (SR) 200 MG PO TB12
200.00 | ORAL_TABLET | ORAL | Status: DC
Start: 2017-11-26 — End: 2017-11-25

## 2017-11-25 MED ORDER — SENNOSIDES-DOCUSATE SODIUM 8.6-50 MG PO TABS
2.00 | ORAL_TABLET | ORAL | Status: DC
Start: 2017-11-25 — End: 2017-11-25

## 2017-11-25 MED ORDER — SIMETHICONE 80 MG PO CHEW
80.00 | CHEWABLE_TABLET | ORAL | Status: DC
Start: ? — End: 2017-11-25

## 2017-11-25 MED FILL — traMADol HCL 50 MG TABS: 50 | 3 days supply | Qty: 15 | Fill #0

## 2017-11-26 ENCOUNTER — Telehealth: Payer: Self-pay

## 2017-11-26 MED FILL — ONDANSETRON HCL 8 MG TABLET: 8 | 7 days supply | Qty: 20 | Fill #0

## 2017-11-26 NOTE — Telephone Encounter (Signed)
Received fax for refill on epi pen. Patient has not been seen since 04/2014. Patient will need an OV.

## 2017-11-27 ENCOUNTER — Other Ambulatory Visit: Payer: Self-pay | Admitting: *Deleted

## 2017-11-27 ENCOUNTER — Other Ambulatory Visit: Payer: Self-pay

## 2017-11-27 MED ORDER — EPINEPHRINE 0.3 MG/0.3ML IJ SOAJ: 0.3000 mg | Freq: Once | INTRAMUSCULAR | 11 refills | Status: AC

## 2017-11-27 MED FILL — EPINEPHRINE 0.3 MG AUTO-INJ: 0.3 | 2 days supply | Qty: 2 | Fill #0

## 2017-11-27 NOTE — Patient Outreach (Addendum)
[facility]) Care Management  11/27/2017  Jasmine Martinez 06/30/1970 [id number]   Subjective: Telephone call to patient's home  / mobile number, no answer, left HIPAA compliant voicemail message, and requested call back.     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   Patient also has a history of diabetes, colon polyp, hyperlipidemia, hypertension, PMDD (premenstrual dysphoric disorder), Parotid mass (right), and vitamin D deficiency.   Closed to [place] [facility] Link to Wellness program and transitioned to [facility] Diabetes Management program on 12/19/16.        Assessment: Received UMR Transition of care referral on 11/26/17.   Transition of care follow up pending patient contact.        Plan: RNCM will send unsuccessful outreach  letter, [facility] pamphlet, will call patient for 2nd telephone outreach attempt, transition of care follow up, and proceed with case closure, within 10 business days if no return call.         Sharena Dibenedetto H. Annia Friendly, BSN, [place] Management [facility] Telephonic CM Phone: [phone] Fax: [phone]

## 2017-12-01 ENCOUNTER — Encounter: Payer: Self-pay | Admitting: *Deleted

## 2017-12-01 ENCOUNTER — Other Ambulatory Visit: Payer: Self-pay | Admitting: *Deleted

## 2017-12-01 NOTE — Patient Outreach (Addendum)
[facility]) Care Management  12/01/2017  Jasmine Martinez 01-30-1970 [id number]    Subjective: Telephone call to patient's home / mobile number, spoke with patient, and HIPAA verified.  Discussed [facility] Care Management UMR Transition of care follow up, patient voiced understanding, and is in agreement to follow up.   Patient states she is doing much better, pain management in place, has a follow up with surgeon on 12/10/17, will receive surgical pathology results at follow up visit, and next steps with be determined during visit.  States she is tolerating full liquid diet and working on increasing calorie intake.   Discussed importance of hospital follow up with primary MD, patient voices understanding, and states she will follow up as appropriate.    Patient states she is able to manage self care and has assistance as needed.  Patient voices understanding of medical diagnosis, surgery,  and treatment plan.  States she is accessing the following [facility] benefits: outpatient pharmacy, hospital indemnity ( is working on [employer] a claim, will contact [place] to obtain discharge summary or itemized bill), and has family medical leave act (FMLA) in place.  Patient aware she may need to obtain copy of all FMLA paperwork for her records and to follow up to verify paperwork completed in a timely fashion.  Patient states she is active with [place] diabetes management program.  Patient states she does not have any education material, transition of care, care coordination, transportation, community resource, or pharmacy needs at this time.  States she is very appreciative of the follow up and is in agreement to receive [place] Management information.     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   Patient also has a history of diabetes, colon polyp, hyperlipidemia, hypertension, PMDD (premenstrual dysphoric disorder), Parotid mass (right), and vitamin D deficiency.   Closed to [place] [facility] Link to Wellness program and transitioned to [facility] Diabetes Management program on 12/19/16.        Assessment: Received UMR Transition of care referral on 11/26/17.   Transition of care follow up completed, no care management needs, and will proceed with case closure.       Plan: .RNCM will send patient successful outreach letter, [facility] pamphlet, and magnet. RNCM will complete case closure due to follow up completed / no care management needs.         Lear Carstens H. Annia Friendly, BSN, [place] Management [facility] Telephonic CM Phone: [phone] Fax: [phone]

## 2017-12-08 DIAGNOSIS — F411 Generalized anxiety disorder: Secondary | ICD-10-CM | POA: Diagnosis not present

## 2017-12-10 DIAGNOSIS — C49A2 Gastrointestinal stromal tumor of stomach: Secondary | ICD-10-CM | POA: Diagnosis not present

## 2017-12-15 DIAGNOSIS — C49A2 Gastrointestinal stromal tumor of stomach: Secondary | ICD-10-CM | POA: Diagnosis not present

## 2017-12-16 ENCOUNTER — Encounter (INDEPENDENT_AMBULATORY_CARE_PROVIDER_SITE_OTHER): Payer: Self-pay | Admitting: Family Medicine

## 2017-12-16 DIAGNOSIS — C49A2 Gastrointestinal stromal tumor of stomach: Secondary | ICD-10-CM | POA: Diagnosis not present

## 2017-12-17 NOTE — Telephone Encounter (Signed)
Can you please contact her to schedule an appointment with Hoyle Sauer?

## 2017-12-23 DIAGNOSIS — F411 Generalized anxiety disorder: Secondary | ICD-10-CM | POA: Diagnosis not present

## 2017-12-23 MED FILL — SAXENDA 18 MG/3 ML PEN: 18 | 30 days supply | Qty: 15 | Fill #0

## 2017-12-23 MED FILL — ATORVASTATIN 10 MG TABLET: 10 | 30 days supply | Qty: 30 | Fill #1

## 2017-12-23 MED FILL — BUPROPION HCL SR 200 MG TAB: 200 | 30 days supply | Qty: 30 | Fill #1

## 2017-12-24 ENCOUNTER — Ambulatory Visit (INDEPENDENT_AMBULATORY_CARE_PROVIDER_SITE_OTHER): Payer: 59 | Admitting: Dietician

## 2017-12-24 VITALS — Ht 65.0 in | Wt 205.0 lb

## 2017-12-24 DIAGNOSIS — Z9189 Other specified personal risk factors, not elsewhere classified: Secondary | ICD-10-CM | POA: Diagnosis not present

## 2017-12-24 DIAGNOSIS — E669 Obesity, unspecified: Secondary | ICD-10-CM | POA: Diagnosis not present

## 2017-12-24 DIAGNOSIS — E119 Type 2 diabetes mellitus without complications: Secondary | ICD-10-CM

## 2017-12-24 DIAGNOSIS — Z6834 Body mass index (BMI) 34.0-34.9, adult: Secondary | ICD-10-CM

## 2017-12-24 NOTE — Progress Notes (Signed)
  Office: [phone]  /  Fax: [phone]     Jasmine Martinez has a diagnosis of diabetes type II putting her at higher than average risk for cardiovascular disease and other diabetic comorbidities.   Jasmine Martinez states BGs consistently in an acceptable range and denies any hypoglycemic episodes. Last A1c was Hemoglobin A1C Latest Ref Rng & Units 07/07/2017 01/20/2017  HGBA1C 4.8 - 5.6 % 4.6(L) 4.7(L)  Some recent data might be hidden    She has been working on intensive lifestyle modifications including diet, exercise, and weight loss to help control her blood glucose levels. 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   Her weight today is 205 lbs. She has maintained her weight since her last visit. She has had a weight loss of 55 lbs since beginning treatment with [place]. She states that she is tolerating solid foods without difficulty since her surgery however admits her appetite is still not back to normal. She states she is having some aversions to some foods, especially meats. Her goal for the next 2 weeks will be weight maintenance and making efforts to increase her protein intake. We discussed in detail ways to gradually add lean proteins to her day and if needed add some protein powder to her smoothie in the morning until her appetite improves. Emphasized the importance of eating quality lean proteins/fruits/vegetables vs using supplements long term.   Jasmine Martinez is on the following meal plan: portion control and smart food choices.  Her  meal plan was individualized for maximum benefit. Specific food options discussed in detail.  Also discussed at length the following behavioral modifications to help maximize success:  increasing lean protein intake, decreasing simple carbohydrates, increasing vegetables, increase water intake.    Jasmine Martinez has been instructed to work up to a goal of 150  minutes of combined cardio and strengthening exercise per week for weight loss and overall health benefits. She states she has started walking at least 3 days per week.    OBESITY BEHAVIORAL INTERVENTION VISIT  Today's visit was # 48  Starting weight: 260 lbs Starting date: 01/02/16 Today's weight : Weight: 205 lb (93 kg)  Today's date: 12/24/2017 Total lbs lost to date: 55 lbs At least 15 minutes were spent on discussing the following behavioral intervention visit.   ASK: We discussed the diagnosis of obesity with Jasmine Martinez today and Jasmine Martinez agreed to give [place] permission to discuss obesity behavioral modification therapy today.  ASSESS: Jasmine Martinez has the diagnosis of obesity and her BMI today is 34 Jasmine Martinez is in the action stage of change   ADVISE: Jasmine Martinez was educated on the multiple health risks of obesity as well as the benefit of weight loss to improve her health. She was advised of the need for long term treatment and the importance of lifestyle modifications to improve her current health and to decrease her risk of future health problems.  AGREE: Multiple dietary modification options and treatment options were discussed and  Jasmine Martinez agreed to follow the recommendations documented in the above note.  ARRANGE: Jasmine Martinez was educated on the importance of frequent visits to treat obesity as outlined per CMS and USPSTF guidelines and agreed to schedule her next follow up appointment today.

## 2018-01-16 DIAGNOSIS — F411 Generalized anxiety disorder: Secondary | ICD-10-CM | POA: Diagnosis not present

## 2018-01-20 ENCOUNTER — Encounter (INDEPENDENT_AMBULATORY_CARE_PROVIDER_SITE_OTHER): Payer: Self-pay

## 2018-01-20 ENCOUNTER — Ambulatory Visit (INDEPENDENT_AMBULATORY_CARE_PROVIDER_SITE_OTHER): Payer: 59 | Admitting: Family Medicine

## 2018-01-27 DIAGNOSIS — F411 Generalized anxiety disorder: Secondary | ICD-10-CM | POA: Diagnosis not present

## 2018-02-03 DIAGNOSIS — F411 Generalized anxiety disorder: Secondary | ICD-10-CM | POA: Diagnosis not present

## 2018-02-10 DIAGNOSIS — F411 Generalized anxiety disorder: Secondary | ICD-10-CM | POA: Diagnosis not present

## 2018-02-12 ENCOUNTER — Telehealth: Payer: 59 | Admitting: Family

## 2018-02-12 DIAGNOSIS — J111 Influenza due to unidentified influenza virus with other respiratory manifestations: Secondary | ICD-10-CM | POA: Diagnosis not present

## 2018-02-12 MED ORDER — OSELTAMIVIR PHOSPHATE 75 MG PO CAPS: 75.0000 mg | ORAL_CAPSULE | Freq: Two times a day (BID) | ORAL | 0 refills | Status: DC

## 2018-02-12 NOTE — Progress Notes (Signed)
Greater than 5 minutes, yet less than 10 minutes of time have been spent researching, coordinating, and implementing care for this patient today.  Thank you for the details you included in the comment boxes. Those details are very helpful in determining the best course of treatment for you and help [place] to provide the best care.  E visit for Flu like symptoms   We are sorry that you are not feeling well.  Here is how we plan to help! Based on what you have shared with me it looks like you may have a respiratory virus that may be influenza.  Influenza or "the flu" is   an infection caused by a respiratory virus. The flu virus is highly contagious and persons who did not receive their yearly flu vaccination may "catch" the flu from close contact.  We have anti-viral medications to treat the viruses that cause this infection. They are not a "cure" and only shorten the course of the infection. These prescriptions are most effective when they are given within the first 2 days of "flu" symptoms. Antiviral medication are indicated if you have a high risk of complications from the flu. You should  also consider an antiviral medication if you are in close contact with someone who is at risk. These medications can help patients avoid complications from the flu  but have side effects that you should know. Possible side effects from Tamiflu or oseltamivir include nausea, vomiting, diarrhea, dizziness, headaches, eye redness, sleep problems or other respiratory symptoms. You should not take Tamiflu if you have an allergy to oseltamivir or any to the ingredients in Tamiflu.  Based upon your symptoms and potential risk factors I have prescribed Oseltamivir (Tamiflu).  It has been sent to your designated pharmacy.  You will take one 75 mg capsule orally twice a day for the next 5 days.  ANYONE WHO HAS FLU SYMPTOMS SHOULD: . Stay home. The flu is highly contagious and going out or to work exposes others! . Be sure to  drink plenty of fluids. Water is fine as well as fruit juices, sodas and electrolyte beverages. You may want to stay away from caffeine or alcohol. If you are nauseated, try taking small sips of liquids. How do you know if you are getting enough fluid? Your urine should be a pale yellow or almost colorless. . Get rest. . Taking a steamy shower or using a humidifier may help nasal congestion and ease sore throat pain. Using a saline nasal spray works much the same way. . Cough drops, hard candies and sore throat lozenges may ease your cough. . Line up a caregiver. Have someone check on you regularly.   GET HELP RIGHT AWAY IF: . You cannot keep down liquids or your medications. . You become short of breath . Your fell like you are going to pass out or loose consciousness. . Your symptoms persist after you have completed your treatment plan MAKE SURE YOU   Understand these instructions.  Will watch your condition.  Will get help right away if you are not doing well or get worse.  Your e-visit answers were reviewed by a board certified advanced clinical practitioner to complete your personal care plan.  Depending on the condition, your plan could have included both over the counter or prescription medications.  If there is a problem please reply  once you have received a response from your provider.  Your safety is important to [place].  If you have drug allergies check  your prescription carefully.    You can use MyChart to ask questions about today's visit, request a non-urgent call back, or ask for a work or school excuse for 24 hours related to this e-Visit. If it has been greater than 24 hours you will need to follow up with your provider, or enter a new e-Visit to address those concerns.  You will get an e-mail in the next two days asking about your experience.  I hope that your e-visit has been valuable and will speed your recovery. Thank you for using e-visits.

## 2018-02-24 DIAGNOSIS — F411 Generalized anxiety disorder: Secondary | ICD-10-CM | POA: Diagnosis not present

## 2018-03-03 DIAGNOSIS — F411 Generalized anxiety disorder: Secondary | ICD-10-CM | POA: Diagnosis not present

## 2018-03-04 DIAGNOSIS — J159 Unspecified bacterial pneumonia: Secondary | ICD-10-CM | POA: Diagnosis not present

## 2018-03-04 DIAGNOSIS — Z903 Acquired absence of stomach [part of]: Secondary | ICD-10-CM | POA: Diagnosis not present

## 2018-03-04 DIAGNOSIS — J189 Pneumonia, unspecified organism: Secondary | ICD-10-CM | POA: Diagnosis not present

## 2018-03-04 DIAGNOSIS — C49A2 Gastrointestinal stromal tumor of stomach: Secondary | ICD-10-CM | POA: Diagnosis not present

## 2018-03-04 MED FILL — AMOX-CLAV 875-125 MG TABLET: 875-125 | 7 days supply | Qty: 14 | Fill #0

## 2018-03-06 ENCOUNTER — Telehealth: Payer: Self-pay | Admitting: Internal Medicine

## 2018-03-06 NOTE — Telephone Encounter (Signed)
Patient states that she's only getting worse.  Started the Augmentin for pneumonia on Wednesday, but is still feeling very bad.  Her Mom is a Marine scientist and she was thinking it may help her to have a course of Prednisone with the Augmentin.  She wants to know if you would mind to send that in for her?    Pharmacy:  [facility] in [place]  Thank you.

## 2018-03-06 NOTE — Telephone Encounter (Signed)
I am not going to send anything in without seeing her. Can she come here or go to ED

## 2018-03-06 NOTE — Telephone Encounter (Signed)
Spoke with patient.  If she gets worse over the weekend, she will go to the Urgent Care.  If not, she'll call on Monday if she needs to be seen.

## 2018-03-10 DIAGNOSIS — F411 Generalized anxiety disorder: Secondary | ICD-10-CM | POA: Diagnosis not present

## 2018-03-13 ENCOUNTER — Encounter: Payer: Self-pay | Admitting: Internal Medicine

## 2018-03-13 ENCOUNTER — Ambulatory Visit
Admission: RE | Admit: 2018-03-13 | Discharge: 2018-03-13 | Disposition: A | Discharge status: Home / Self Care | Payer: 59 | Source: Ambulatory Visit | Attending: Internal Medicine | Admitting: Internal Medicine

## 2018-03-13 ENCOUNTER — Ambulatory Visit: Payer: 59 | Admitting: Internal Medicine

## 2018-03-13 VITALS — BP 140/80 | HR 82 | Temp 98.5°F | Ht 65.0 in | Wt 211.0 lb

## 2018-03-13 DIAGNOSIS — F4321 Adjustment disorder with depressed mood: Secondary | ICD-10-CM | POA: Diagnosis not present

## 2018-03-13 DIAGNOSIS — J189 Pneumonia, unspecified organism: Secondary | ICD-10-CM

## 2018-03-13 DIAGNOSIS — Z7184 Encounter for health counseling related to travel: Secondary | ICD-10-CM

## 2018-03-13 DIAGNOSIS — J181 Lobar pneumonia, unspecified organism: Secondary | ICD-10-CM | POA: Diagnosis not present

## 2018-03-13 DIAGNOSIS — F5102 Adjustment insomnia: Secondary | ICD-10-CM | POA: Diagnosis not present

## 2018-03-13 DIAGNOSIS — Z8701 Personal history of pneumonia (recurrent): Secondary | ICD-10-CM | POA: Diagnosis not present

## 2018-03-13 MED ORDER — ALBUTEROL SULFATE HFA 108 (90 BASE) MCG/ACT IN AERS: 2.0000 | INHALATION_SPRAY | Freq: Four times a day (QID) | RESPIRATORY_TRACT | 2 refills | Status: DC | PRN

## 2018-03-13 MED ORDER — BENZONATATE 100 MG PO CAPS: 100.0000 mg | ORAL_CAPSULE | Freq: Three times a day (TID) | ORAL | 1 refills | Status: DC | PRN

## 2018-03-13 MED ORDER — ALPRAZOLAM 0.5 MG PO TABS: ORAL_TABLET | ORAL | 0 refills | Status: DC

## 2018-03-13 MED ORDER — LEVOFLOXACIN 500 MG PO TABS: 500.0000 mg | ORAL_TABLET | Freq: Every day | ORAL | 0 refills | Status: DC

## 2018-03-13 MED ORDER — ESZOPICLONE 2 MG PO TABS: 2.0000 mg | ORAL_TABLET | Freq: Every evening | ORAL | 2 refills | Status: DC | PRN

## 2018-03-13 MED FILL — levoFLOXacin 500 MG TABS: 500 | 10 days supply | Qty: 10 | Fill #0

## 2018-03-13 MED FILL — ESZOPICLONE 2 MG TAB: 2 | 30 days supply | Qty: 30 | Fill #0 | Status: TO

## 2018-03-13 MED FILL — BENZONATATE 100 MG CAPS: 100 | 10 days supply | Qty: 30 | Fill #0

## 2018-03-13 MED FILL — ALPRAZolam 0.5 MG TABS: 0.5 | 30 days supply | Qty: 180 | Fill #0

## 2018-03-13 MED FILL — VENTOLIN HFA 90 MCG INHALER: 108 (90 BAS | 25 days supply | Qty: 18 | Fill #0

## 2018-03-13 NOTE — Patient Instructions (Signed)
Have chest x-ray today.  Levaquin 500 mg daily for 10 days.  Xanax as needed for anxiety.  Tessalon Perles 3 times daily as needed for cough.  Albuterol inhaler 2 sprays p.o. 4 times daily as needed.  Continue counseling.  Try Lunesta for sleep.

## 2018-03-13 NOTE — Progress Notes (Signed)
   Subjective:    Patient ID: Jasmine Martinez, female    DOB: 02/21/1970, 48 y.o.   MRN: [id number]  HPI She had a CT of the abdomen and pelvis at [facility] with contrast on February 19 for follow-up on gist tumor.  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  Limited images picked up right middle lobe pneumonia with this study.  She was placed on Augmentin but has not improved.  She has cough and congestion.  She took Augmentin for 7 days.  Last week began to cough up green sputum.  No fever or shaking chills.  Chest has been tight.  Before chest CT she was barely coughing.  The fact that she had pneumonia was a surprise to her.  However couple weeks prior to that she had had a flulike illness.    She lost her husband recently due to suicide.  She is in counseling.  She is not sleeping well.  She takes Xanax around 10 PM and goes to sleep for 1/2-hour and then is awake again.  Review of Systems fatigue     Objective:   Physical Exam TMs and pharynx are clear.  Vital signs reviewed.  Neck is supple.  Chest clear to auscultation without rales or wheezing.       Assessment & Plan:  Recent diagnosis of right middle lobe pneumonia after flulike illness based on chest CT done at [facility] mid February.  Has not gotten better with 7 days of Augmentin.  Changed to Levaquin 500 mg daily for 10 days.  Grief reaction-refill Xanax.  Patient is in counseling.  Situational stress at work-discussed  Insomnia-try Lunesta 2 mg at bedtime.  Plan: Xanax as needed for anxiety.  Levaquin 500 mg daily for 10 days.  Continue counseling.  FMLA if needed.  Tessalon Perles 100 mg 3 times daily as needed for cough.  Albuterol inhaler 2 sprays p.o. 4 times daily as needed.

## 2018-03-17 DIAGNOSIS — F411 Generalized anxiety disorder: Secondary | ICD-10-CM | POA: Diagnosis not present

## 2018-03-24 DIAGNOSIS — F411 Generalized anxiety disorder: Secondary | ICD-10-CM | POA: Diagnosis not present

## 2018-03-28 ENCOUNTER — Other Ambulatory Visit (INDEPENDENT_AMBULATORY_CARE_PROVIDER_SITE_OTHER): Payer: Self-pay | Admitting: Family Medicine

## 2018-03-28 DIAGNOSIS — E119 Type 2 diabetes mellitus without complications: Secondary | ICD-10-CM

## 2018-03-28 MED FILL — VIT D2 1.25 MG (50,000 UNIT: 1.25 MG | 30 days supply | Qty: 10 | Fill #1

## 2018-03-28 MED FILL — BUPROPION HCL SR 200 MG TAB: 200 | 30 days supply | Qty: 30 | Fill #2

## 2018-03-31 ENCOUNTER — Other Ambulatory Visit (INDEPENDENT_AMBULATORY_CARE_PROVIDER_SITE_OTHER): Payer: Self-pay | Admitting: Family Medicine

## 2018-03-31 DIAGNOSIS — E119 Type 2 diabetes mellitus without complications: Secondary | ICD-10-CM

## 2018-03-31 MED ORDER — GLUCOSE BLOOD VI STRP: ORAL_STRIP | 0 refills | Status: DC

## 2018-03-31 MED ORDER — TRUEPLUS LANCETS 30G MISC: 1.0000 | Freq: Every day | 0 refills | Status: DC

## 2018-03-31 MED FILL — ACCU-CHEK GUIDE TEST STRIP: 90 days supply | Qty: 100 | Fill #0

## 2018-03-31 MED FILL — ACCU-CHEK FASTCLIX LANCETS: 90 days supply | Qty: 102 | Fill #0

## 2018-04-07 DIAGNOSIS — F411 Generalized anxiety disorder: Secondary | ICD-10-CM | POA: Diagnosis not present

## 2018-04-10 ENCOUNTER — Other Ambulatory Visit (INDEPENDENT_AMBULATORY_CARE_PROVIDER_SITE_OTHER): Payer: Self-pay | Admitting: Family Medicine

## 2018-04-10 DIAGNOSIS — F3289 Other specified depressive episodes: Secondary | ICD-10-CM

## 2018-04-10 MED FILL — ESZOPICLONE 2 MG TAB: 2 | 30 days supply | Qty: 30 | Fill #0

## 2018-04-13 ENCOUNTER — Encounter (INDEPENDENT_AMBULATORY_CARE_PROVIDER_SITE_OTHER): Payer: Self-pay

## 2018-04-14 ENCOUNTER — Other Ambulatory Visit: Payer: Self-pay

## 2018-04-14 ENCOUNTER — Encounter (INDEPENDENT_AMBULATORY_CARE_PROVIDER_SITE_OTHER): Payer: Self-pay | Admitting: Family Medicine

## 2018-04-14 ENCOUNTER — Ambulatory Visit (INDEPENDENT_AMBULATORY_CARE_PROVIDER_SITE_OTHER): Payer: 59 | Admitting: Family Medicine

## 2018-04-14 DIAGNOSIS — E119 Type 2 diabetes mellitus without complications: Secondary | ICD-10-CM | POA: Diagnosis not present

## 2018-04-14 DIAGNOSIS — E7849 Other hyperlipidemia: Secondary | ICD-10-CM

## 2018-04-14 DIAGNOSIS — F3289 Other specified depressive episodes: Secondary | ICD-10-CM

## 2018-04-14 DIAGNOSIS — Z6835 Body mass index (BMI) 35.0-35.9, adult: Secondary | ICD-10-CM

## 2018-04-14 DIAGNOSIS — E559 Vitamin D deficiency, unspecified: Secondary | ICD-10-CM

## 2018-04-14 DIAGNOSIS — F411 Generalized anxiety disorder: Secondary | ICD-10-CM | POA: Diagnosis not present

## 2018-04-14 MED ORDER — VITAMIN D (ERGOCALCIFEROL) 1.25 MG (50000 UNIT) PO CAPS: 50000.0000 [IU] | ORAL_CAPSULE | ORAL | 0 refills | Status: DC

## 2018-04-14 MED ORDER — LIRAGLUTIDE -WEIGHT MANAGEMENT 18 MG/3ML ~~LOC~~ SOPN: 3.0000 mg | PEN_INJECTOR | Freq: Every day | SUBCUTANEOUS | 0 refills | Status: DC

## 2018-04-14 MED ORDER — BUPROPION HCL ER (SR) 150 MG PO TB12: 150.0000 mg | ORAL_TABLET | Freq: Two times a day (BID) | ORAL | 0 refills | Status: DC

## 2018-04-14 MED ORDER — INSULIN PEN NEEDLE 32G X 4 MM MISC: 1.0000 | Freq: Two times a day (BID) | 0 refills | Status: DC

## 2018-04-14 MED ORDER — ATORVASTATIN CALCIUM 10 MG PO TABS: 10.0000 mg | ORAL_TABLET | Freq: Every day | ORAL | 0 refills | Status: DC

## 2018-04-14 NOTE — Progress Notes (Signed)
Office: [phone]  /  Fax: [phone] TeleHealth Visit:  Jasmine Martinez has consented to this TeleHealth visit today via Web-Ex. The patient is located at home, the provider is located at the [facility] office. The participants in this visit include the listed provider and patient and any and all parties involved.   HPI:   Chief Complaint: OBESITY Jasmine Martinez is here to discuss her progress with her obesity treatment plan. She is on the lower carbohydrate, vegetable and lean protein rich diet plan and is following her eating plan approximately 90 % of the time. She states she is walking for 20 minutes 7 times per week. Jasmine Martinez's last visit was approximately three months ago. Since then her husband has passed away. She noted gaining some weight back, but she has gone back to the low carb plan and she feels she is back on track now.  We were unable to weigh the patient today for this TeleHealth visit.She feels as if she has gained some weight since her last visit. She has lost 49 lbs since starting treatment with [place].  Hyperlipidemia Jasmine Martinez has hyperlipidemia and she is stable on Lipitor. She has been trying to improve her cholesterol levels with intensive lifestyle modification including a low saturated fat diet, exercise and weight loss. She denies any chest pain or myalgias.  Vitamin D deficiency Jasmine Martinez has a diagnosis of vitamin D deficiency. Jasmine Martinez is stable on vit D and she denies nausea, vomiting or muscle weakness. There are no recent labs.  Diabetes II Jasmine Martinez has a diagnosis of diabetes type II. She had very good control of her blood sugars, but they had increased from between 90 to 100 to ranging between 120 and 140's with increased comfort eating. She is back on track now and she is due for labs. Jasmine Martinez denies any hypoglycemic episodes. Last A1c was at 4.6 (07/07/17). She has been working on intensive lifestyle modifications including diet, exercise, and  weight loss to help control her blood glucose levels.  Depression with emotional eating behaviors Jasmine Martinez's mood is low, which is understandable. She feels scattered and very emotionally labile. She wonders if we could increase her Wellbutrin. Jasmine Martinez struggles with emotional eating and using food for comfort to the extent that it is negatively impacting her health. She often snacks when she is not hungry. Jasmine Martinez sometimes feels she is out of control and then feels guilty that she made poor food choices. She has been working on behavior modification techniques to help reduce her emotional eating and has been somewhat successful. She shows no sign of suicidal or homicidal ideations.  Depression screen Jasmine Martinez 2/9 04/23/2016 01/02/2016 02/14/2015 06/23/2014  Decreased Interest 0 1 0 1  Down, Depressed, Hopeless 0 1 0 1  PHQ - 2 Score 0 2 0 2  Altered sleeping - 2 - 1  Tired, decreased energy - 3 - 1  Change in appetite - 3 - 0  Feeling bad or failure about yourself  - 3 - 0  Trouble concentrating - 0 - 0  Moving slowly or fidgety/restless - 0 - 0  Suicidal thoughts - 0 - 0  PHQ-9 Score - 13 - 4  Difficult doing work/chores - - - Not difficult at all     ASSESSMENT AND PLAN:  Other hyperlipidemia - Plan: Lipid Panel With LDL/HDL Ratio, atorvastatin (LIPITOR) 10 MG tablet  Vitamin D deficiency - Plan: VITAMIN D 25 Hydroxy (Vit-D Deficiency, Fractures), Vitamin D, Ergocalciferol, (DRISDOL) 1.25 MG (50000 UT) CAPS  capsule  Type 2 diabetes mellitus without complication, without long-term current use of insulin ([place]) - Plan: Comprehensive metabolic panel, Hemoglobin A1c, Insulin, random  Other depression - with emotional eating - Plan: buPROPion (WELLBUTRIN SR) 150 MG 12 hr tablet  Class 2 severe obesity with serious comorbidity and body mass index (BMI) of 35.0 to 35.9 in adult, unspecified obesity type ([place]) - Plan: Liraglutide -Weight Management (SAXENDA) 18 MG/3ML SOPN, Insulin Pen Needle (BD  PEN NEEDLE NANO 2ND GEN) 32G X 4 MM MISC  PLAN:  Hyperlipidemia Jasmine Martinez was informed of the American Heart Association Guidelines emphasizing intensive lifestyle modifications as the first line treatment for hyperlipidemia. We discussed many lifestyle modifications today in depth, and Jasmine Martinez will continue to work on decreasing saturated fats such as fatty red meat, butter and many fried foods. She will also increase vegetables and lean protein in her diet and continue to work on exercise and weight loss efforts.  We will recheck labs and Jasmine Martinez agrees to continue Lipitor 10 mg daily #30 with no refills and follow up as directed.   Vitamin D Deficiency Jasmine Martinez was informed that low vitamin D levels contributes to fatigue and are associated with obesity, breast, and colon cancer. She agrees to continue to take prescription Vit D @50 ,000 IU every 3 days #10 with no refills and will follow up for routine testing of vitamin D, at least 2-3 times per year. She was informed of the risk of over-replacement of vitamin D and agrees to not increase her dose unless she discusses this with [place] first. We will check labs today and Jasmine Martinez agrees to follow up with our clinic in 2 weeks.  Diabetes II Jasmine Martinez has been given extensive diabetes education by myself today including ideal fasting and post-prandial blood glucose readings, individual ideal Hgb A1c goals and hypoglycemia prevention. We discussed the importance of good blood sugar control to decrease the likelihood of diabetic complications such as nephropathy, neuropathy, limb loss, blindness, coronary artery disease, and death. We discussed the importance of intensive lifestyle modification including diet, exercise and weight loss as the first line treatment for diabetes. Jasmine Martinez agrees to continue with diet and Liraglutide. We will check labs and follow. Jasmine Martinez will follow up at the agreed upon time.  Depression with Emotional Eating Behaviors We  discussed behavior modification techniques today to help Anjalee deal with her emotional eating and depression. She has agreed to increase Wellbutrin SR 150 mg to BID #60 with no refills and follow up with our clinic in 2 weeks.  Obesity Jasmine Martinez is currently in the action stage of change. As such, her goal is to continue with weight loss efforts She has agreed to follow a lower carbohydrate, vegetable and lean protein rich diet plan Jasmine Martinez has been instructed to work up to a goal of 150 minutes of combined cardio and strengthening exercise per week for weight loss and overall health benefits. We discussed the following Behavioral Modification Strategies today: keeping healthy foods in the home, better snacking choices, increasing lean protein intake, work on meal planning and easy cooking plans, emotional eating strategies, ways to avoid boredom eating and ways to avoid night time snacking We discussed various medication options to help Jasmine Martinez with her weight loss efforts and we both agreed to continue Saxenda 18 mg (3 mg into the skin daily) #5 pens with no refills and BD pen needles Nano 2nd Gen #100 with no refills.  Jasmine Martinez has agreed to follow up with our clinic in 2 weeks. She  was informed of the importance of frequent follow up visits to maximize her success with intensive lifestyle modifications for her multiple health conditions.  ALLERGIES: Allergies  Allergen Reactions   Codeine Shortness Of Breath   Hydrocodone Shortness Of Breath    Mild SOB    MEDICATIONS: Current Outpatient Medications on File Prior to Visit  Medication Sig Dispense Refill   albuterol (PROVENTIL HFA;VENTOLIN HFA) 108 (90 Base) MCG/ACT inhaler Inhale 2 puffs into the lungs every 6 (six) hours as needed for wheezing or shortness of breath. 1 Inhaler 2   ALPRAZolam (XANAX) 0.5 MG tablet One or 2 tablets po 3 times daily as needed for anxiety 180 tablet 0   benzonatate (TESSALON) 100 MG capsule Take 1  capsule (100 mg total) by mouth 3 (three) times daily as needed for cough. 30 capsule 1   eszopiclone (LUNESTA) 2 MG TABS tablet Take 1 tablet (2 mg total) by mouth at bedtime as needed for sleep. Take immediately before bedtime 30 tablet 2   glucose blood (ACCU-CHEK GUIDE) test strip Use as instructed 100 each 0   levofloxacin (LEVAQUIN) 500 MG tablet Take 1 tablet (500 mg total) by mouth daily. 10 tablet 0   levonorgestrel (MIRENA) 20 MCG/24HR IUD 1 each by Intrauterine route once.     Melatonin 3 MG TABS Take by mouth.     NOVOFINE 32G X 6 MM MISC INJECT AS DIRECTED once DAILY. 100 each 0   TRUEplus Lancets 30G MISC Inject 1 Stick as directed daily. 100 each 0   No current facility-administered medications on file prior to visit.     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   Heartburn    HTN (hypertension)    no meds now   Hyperlipidemia    Obesity    Palpitations    Parotid mass    right   PMDD (premenstrual dysphoric disorder) 01/02/2016   Shortness of breath    Vitamin D deficiency    Vitamin D deficiency     PAST SURGICAL HISTORY: Past Surgical History:  Procedure Laterality Date   APPENDECTOMY     CHOLECYSTECTOMY     EUS N/A 03/23/2014   Procedure: ESOPHAGEAL ENDOSCOPIC ULTRASOUND (EUS) RADIAL;  Surgeon: Arta Silence, MD;  Location: [facility];  Service: Endoscopy;  Laterality: N/A;   FINE NEEDLE ASPIRATION N/A 03/23/2014   Procedure: FINE NEEDLE ASPIRATION (FNA) LINEAR;  Surgeon: Arta Silence, MD;  Location: [facility];  Service: Endoscopy;  Laterality: N/A;   PAROTIDECTOMY Right 04/02/2017   Procedure: RIGHT SUPERFICIAL PAROTIDECTOMY;  Surgeon: Jodi Marble, MD;  Location: [facility];  Service:  ENT;  Laterality: Right;   TONSILLECTOMY      SOCIAL HISTORY: Social History   Tobacco Use   Smoking status: Never Smoker   Smokeless tobacco: Never Used  Substance Use Topics   Alcohol use: No   Drug use: No    Frequency: 3.0 times per week    FAMILY HISTORY: Family History  Problem Relation Age of Onset   Healthy Mother    Hypertension Mother    Hyperlipidemia Mother    Alcoholism Mother    Anxiety disorder Mother  Drug abuse Mother    Healthy Father    Colon cancer Paternal Grandfather 24       colon    ROS: Review of Systems  Cardiovascular: Negative for chest pain.  Gastrointestinal: Negative for nausea and vomiting.  Musculoskeletal: Negative for myalgias.       Negative for muscle weakness  Endo/Heme/Allergies:       Negative for hypoglycemia  Psychiatric/Behavioral: Positive for depression. Negative for suicidal ideas.    PHYSICAL EXAM: Pt in no acute distress  RECENT LABS AND TESTS: BMET    Component Value Date/Time   NA 141 07/07/2017 0825   K 4.3 07/07/2017 0825   CL 105 07/07/2017 0825   CO2 20 07/07/2017 0825   GLUCOSE 85 07/07/2017 0825   GLUCOSE 102 (H) 03/31/2017 0945   BUN 12 07/07/2017 0825   CREATININE 0.66 07/07/2017 0825   CREATININE 0.68 02/27/2017 1601   CALCIUM 9.5 07/07/2017 0825   GFRNONAA 108 07/07/2017 0825   GFRNONAA 106 02/27/2017 1601   GFRAA 124 07/07/2017 0825   GFRAA 123 02/27/2017 1601   Lab Results  Component Value Date   HGBA1C 4.6 (L) 07/07/2017   HGBA1C 4.7 (L) 01/20/2017   HGBA1C 4.9 09/25/2016   HGBA1C 5.5 04/18/2016   HGBA1C 6.3 (H) 01/02/2016   Lab Results  Component Value Date   INSULIN 8.1 07/07/2017   INSULIN 11.0 01/20/2017   INSULIN 11.7 09/25/2016   INSULIN 21.8 04/18/2016   INSULIN 15.3 01/02/2016   CBC    Component Value Date/Time   WBC 9.8 06/13/2017 1607   RBC 4.27 06/13/2017 1607   HGB 13.2 06/13/2017 1607   HGB 13.9 03/20/2017 0930   HCT 37.7 06/13/2017 1607    HCT 42.2 03/20/2017 0930   PLT 288 06/13/2017 1607   PLT 392 (H) 04/18/2016 1201   MCV 88.3 06/13/2017 1607   MCV 94 03/20/2017 0930   MCH 30.9 06/13/2017 1607   MCHC 35.0 06/13/2017 1607   RDW 12.0 06/13/2017 1607   RDW 13.2 03/20/2017 0930   LYMPHSABS 2,264 06/13/2017 1607   LYMPHSABS 1.7 03/20/2017 0930   MONOABS 0.6 05/05/2014 0902   EOSABS 88 06/13/2017 1607   EOSABS 0.1 03/20/2017 0930   BASOSABS 29 06/13/2017 1607   BASOSABS 0.0 03/20/2017 0930   Iron/TIBC/Ferritin/ %Sat No results found for: IRON, TIBC, FERRITIN, IRONPCTSAT Lipid Panel     Component Value Date/Time   CHOL 143 01/20/2017 0924   TRIG 68 01/20/2017 0924   HDL 37 (L) 01/20/2017 0924   CHOLHDL 3.4 05/05/2014 0902   VLDL 16 05/05/2014 0902   LDLCALC 92 01/20/2017 0924   Hepatic Function Panel     Component Value Date/Time   PROT 7.1 07/07/2017 0825   ALBUMIN 4.4 07/07/2017 0825   AST 11 07/07/2017 0825   ALT 13 07/07/2017 0825   ALKPHOS 92 07/07/2017 0825   BILITOT 0.4 07/07/2017 0825      Component Value Date/Time   TSH 3.180 03/20/2017 0930   TSH 3.260 09/25/2016 0811   TSH 2.090 04/18/2016 1201   Results for JANIYLA, LONG (MRN [id number]) as of 04/14/2018 15:26  Ref. Range 07/07/2017 08:25  Vitamin D, 25-Hydroxy Latest Ref Range: 30.0 - 100.0 ng/mL 32.8     I, Doreene Nest, am acting as Location manager for Dennard Nip, MD I have reviewed the above documentation for accuracy and completeness, and I agree with the above. -Dennard Nip, MD

## 2018-04-17 MED FILL — ATORVASTATIN 10 MG TABLET: 10 | 30 days supply | Qty: 30 | Fill #0

## 2018-04-17 MED FILL — UNIFINE PENTIPS 32GX5/32": 32G X 4 MM | 50 days supply | Qty: 100 | Fill #0

## 2018-04-17 MED FILL — SAXENDA 18 MG/3 ML PEN: 18 | 30 days supply | Qty: 15 | Fill #0

## 2018-04-17 MED FILL — UNIFINE PENTIPS 32GX5/32: 32G X 4 MM | 50 days supply | Qty: 100 | Fill #0

## 2018-04-17 MED FILL — BUPROPION HCL SR 150 MG TAB: 150 | 30 days supply | Qty: 60 | Fill #0

## 2018-04-17 MED FILL — VIT D2 1.25 MG (50,000 UNIT: 1.25 MG | 30 days supply | Qty: 10 | Fill #0

## 2018-04-21 DIAGNOSIS — F411 Generalized anxiety disorder: Secondary | ICD-10-CM | POA: Diagnosis not present

## 2018-04-21 DIAGNOSIS — E559 Vitamin D deficiency, unspecified: Secondary | ICD-10-CM | POA: Diagnosis not present

## 2018-04-21 DIAGNOSIS — E119 Type 2 diabetes mellitus without complications: Secondary | ICD-10-CM | POA: Diagnosis not present

## 2018-04-21 DIAGNOSIS — E7849 Other hyperlipidemia: Secondary | ICD-10-CM | POA: Diagnosis not present

## 2018-04-22 DIAGNOSIS — M25541 Pain in joints of right hand: Secondary | ICD-10-CM | POA: Diagnosis not present

## 2018-04-22 LAB — LIPID PANEL WITH LDL/HDL RATIO
Cholesterol, Total: 181 mg/dL (ref 100–199)
HDL: 57 mg/dL (ref >39)
LDL Calculated: 117 mg/dL — ABNORMAL HIGH (ref 0–99)
LDl/HDL Ratio: 2.1 ratio (ref 0.0–3.2)
Triglycerides: 35 mg/dL (ref 0–149)
VLDL Cholesterol Cal: 7 mg/dL (ref 5–40)

## 2018-04-22 LAB — COMPREHENSIVE METABOLIC PANEL
ALT: 14 IU/L (ref 0–32)
AST: 13 IU/L (ref 0–40)
Albumin/Globulin Ratio: 1.7 (ref 1.2–2.2)
Albumin: 4.3 g/dL (ref 3.8–4.8)
Alkaline Phosphatase: 74 IU/L (ref 39–117)
BUN/Creatinine Ratio: 22 (ref 9–23)
BUN: 14 mg/dL (ref 6–24)
Bilirubin Total: 0.4 mg/dL (ref 0.0–1.2)
CO2: 20 mmol/L (ref 20–29)
Calcium: 9.1 mg/dL (ref 8.7–10.2)
Chloride: 106 mmol/L (ref 96–106)
Creatinine, Ser: 0.64 mg/dL (ref 0.57–1.00)
GFR calc Af Amer: 125 mL/min/{1.73_m2} (ref >59)
GFR calc non Af Amer: 108 mL/min/{1.73_m2} (ref >59)
GLUCOSE: 100 mg/dL — AB (ref 65–99)
Globulin, Total: 2.5 g/dL (ref 1.5–4.5)
Potassium: 4 mmol/L (ref 3.5–5.2)
Sodium: 139 mmol/L (ref 134–144)
Total Protein: 6.8 g/dL (ref 6.0–8.5)

## 2018-04-22 LAB — HEMOGLOBIN A1C
Est. average glucose Bld gHb Est-mCnc: 103 mg/dL
HEMOGLOBIN A1C: 5.2 % (ref 4.8–5.6)

## 2018-04-22 LAB — INSULIN, RANDOM: INSULIN: 8.2 u[IU]/mL (ref 2.6–24.9)

## 2018-04-22 LAB — VITAMIN D 25 HYDROXY (VIT D DEFICIENCY, FRACTURES): Vit D, 25-Hydroxy: 37.7 ng/mL (ref 30.0–100.0)

## 2018-04-28 ENCOUNTER — Encounter (INDEPENDENT_AMBULATORY_CARE_PROVIDER_SITE_OTHER): Payer: Self-pay | Admitting: Family Medicine

## 2018-04-28 ENCOUNTER — Other Ambulatory Visit: Payer: Self-pay

## 2018-04-28 ENCOUNTER — Ambulatory Visit (INDEPENDENT_AMBULATORY_CARE_PROVIDER_SITE_OTHER): Payer: 59 | Admitting: Family Medicine

## 2018-04-28 DIAGNOSIS — E119 Type 2 diabetes mellitus without complications: Secondary | ICD-10-CM

## 2018-04-28 DIAGNOSIS — E559 Vitamin D deficiency, unspecified: Secondary | ICD-10-CM | POA: Diagnosis not present

## 2018-04-28 DIAGNOSIS — Z6835 Body mass index (BMI) 35.0-35.9, adult: Secondary | ICD-10-CM

## 2018-04-28 DIAGNOSIS — F411 Generalized anxiety disorder: Secondary | ICD-10-CM | POA: Diagnosis not present

## 2018-04-28 DIAGNOSIS — E7849 Other hyperlipidemia: Secondary | ICD-10-CM

## 2018-04-28 DIAGNOSIS — F3289 Other specified depressive episodes: Secondary | ICD-10-CM

## 2018-04-28 MED ORDER — LIRAGLUTIDE -WEIGHT MANAGEMENT 18 MG/3ML ~~LOC~~ SOPN: 3.0000 mg | PEN_INJECTOR | Freq: Every day | SUBCUTANEOUS | 0 refills | Status: DC

## 2018-04-28 MED ORDER — VITAMIN D (ERGOCALCIFEROL) 1.25 MG (50000 UNIT) PO CAPS: 50000.0000 [IU] | ORAL_CAPSULE | ORAL | 0 refills | Status: DC

## 2018-04-28 MED ORDER — INSULIN PEN NEEDLE 32G X 4 MM MISC: 1.0000 | Freq: Two times a day (BID) | 0 refills | Status: DC

## 2018-04-28 MED ORDER — BUPROPION HCL ER (SR) 150 MG PO TB12: 150.0000 mg | ORAL_TABLET | Freq: Two times a day (BID) | ORAL | 0 refills | Status: DC

## 2018-04-28 MED ORDER — GLUCOSE BLOOD VI STRP: ORAL_STRIP | 0 refills | Status: DC

## 2018-04-28 MED ORDER — ATORVASTATIN CALCIUM 10 MG PO TABS: 10.0000 mg | ORAL_TABLET | Freq: Every day | ORAL | 0 refills | Status: DC

## 2018-04-29 NOTE — Progress Notes (Signed)
Office: [phone]  /  Fax: [phone] TeleHealth Visit:  Jasmine Martinez has verbally consented to this TeleHealth visit today. The patient is located at home, the provider is located at the [facility] office. The participants in this visit include the listed provider and patient. Jasmine Martinez was unable to use Webex today and the Telehealth visit was conducted via telephone.  HPI:   Chief Complaint: OBESITY Jasmine Martinez is here to discuss her progress with her obesity treatment plan. She is on the lower carbohydrate, vegetable, and lean protein rich diet plan and is following her eating plan approximately 100 % of the time. She states she is walking 40 minutes 2 times per week. Jasmine Martinez feels that she is doing better with her eating. She is feeling overwhelmed with work, but is avoiding comfort eating. She is back on Saxenda and feels that she is doing better with her diet.  We were unable to weigh the patient today for this TeleHealth visit. She feels as if she has lost weight since her last visit. She has lost 55 lbs since starting treatment with [place].  Depression with emotional eating behaviors Jasmine Martinez notes increased stress at work. She is able to sleep well and is getting good support from her son. She has been working on behavior modification techniques to help reduce her emotional eating and has been somewhat successful.   Diabetes II Jasmine Martinez has a diagnosis of diabetes type II. Jasmine Martinez states that her fasting BGs range between 80 and 109 and she had been off Saxenda, but she has restarted now. She had a couple of hypoglycemic episodes when she skipped meals, but is doing better now. Her last A1c was 5.2 on 04/21/18. She has been working on intensive lifestyle modifications including diet, exercise, and weight loss to help control her blood glucose levels.  Vitamin D Deficiency Jasmine Martinez has a diagnosis of vitamin D deficiency. She is currently on vit D, but is not yet at  goal. Jasmine Martinez denies nausea, vomiting, or muscle weakness.  Hyperlipidemia Jasmine Martinez has hyperlipidemia. She has been off Lipitor and her LDL has increased to 117 on 04/21/18. Jasmine Martinez is ready to get back on track with her medications. Jasmine Martinez has been trying to improve her cholesterol levels with intensive lifestyle modification including a low saturated fat diet, exercise, and weight loss. She denies any chest pain.  ASSESSMENT AND PLAN:  Type 2 diabetes mellitus without complication, without long-term current use of insulin (Jasmine Martinez) - Plan: glucose blood (ACCU-CHEK GUIDE) test strip  Vitamin D deficiency - Plan: Vitamin D, Ergocalciferol, (DRISDOL) 1.25 MG (50000 UT) CAPS capsule  Other hyperlipidemia - Plan: atorvastatin (LIPITOR) 10 MG tablet  Other depression - with emotional eating - Plan: buPROPion (WELLBUTRIN SR) 150 MG 12 hr tablet  Class 2 severe obesity with serious comorbidity and body mass index (BMI) of 35.0 to 35.9 in adult, unspecified obesity type (Jasmine Martinez) - Plan: Insulin Pen Needle (BD PEN NEEDLE NANO 2ND GEN) 32G X 4 MM MISC, Liraglutide -Weight Management ([place]) 18 MG/3ML SOPN  PLAN:  Diabetes II Jasmine Martinez has been given extensive diabetes education by myself today including ideal fasting and post-prandial blood glucose readings, individual ideal Hgb A1c goals, and hypoglycemia prevention. We discussed the importance of good blood sugar control to decrease the likelihood of diabetic complications such as nephropathy, neuropathy, limb loss, blindness, coronary artery disease, and death. We discussed the importance of intensive lifestyle modification including diet, exercise, and weight loss as the first line treatment for diabetes. Jasmine Martinez  agrees to continue her diet, exercise, and diabetes medications. We will refill her glucose strips #100 with no refills and she will follow up at the agreed upon time in 3 weeks.  Vitamin D Deficiency Jasmine Martinez was informed that low vitamin D  levels contribute to fatigue and are associated with obesity, breast, and colon cancer. Jasmine Martinez agrees to continue to take prescription Vit D @50 ,000 IU every 3 days #10 with no refills and will follow up for routine testing of vitamin D, at least 2-3 times per year. She was informed of the risk of over-replacement of vitamin D and agrees to not increase her dose unless she discusses this with [place] first. Jasmine Martinez agrees to follow up in 3 weeks as directed.  Hyperlipidemia Jasmine Martinez was informed of the American Heart Association Guidelines emphasizing intensive lifestyle modifications as the first line treatment for hyperlipidemia. We discussed many lifestyle modifications today in depth, and Jasmine Martinez will continue to work on decreasing saturated fats such as fatty red meat, butter and many fried foods. She will also increase vegetables and lean protein in her diet and continue to work on exercise and weight loss efforts. Jasmine Martinez agrees to continue her diet, exercise, and to take Lipitor 10 mg qd #30 with no refills. She agrees with this plan and will follow up at the agreed upon time.  Depression with Emotional Eating Behaviors We discussed behavior modification techniques today to help Jasmine Martinez deal with her emotional eating and depression. She has agreed to take Wellbutrin SR 150 mg BID #60 with no refills and agreed to follow up as directed.  Obesity Jasmine Martinez is currently in the action stage of change. As such, her goal is to continue with weight loss efforts. She has agreed to follow a lower carbohydrate, vegetable, and lean protein rich diet plan. Jasmine Martinez has been instructed to work up to a goal of 150 minutes of combined cardio and strengthening exercise per week for weight loss and overall health benefits. We discussed the following Behavioral Modification Strategies today: work on meal planning and easy cooking plans, no skipping meals, and keeping healthy foods in the home. We discussed  various medication options to help Jasmine Martinez with her weight loss efforts and we both agreed to restart Saxenda 3 mg qd #5 pens with nano needles and no refills.  Jasmine Martinez has agreed to follow up with our clinic in 3 weeks. She was informed of the importance of frequent follow up visits to maximize her success with intensive lifestyle modifications for her multiple health conditions.  ALLERGIES: Allergies  Allergen Reactions  . Codeine Shortness Of Breath  . Hydrocodone Shortness Of Breath    Mild SOB    MEDICATIONS: Current Outpatient Medications on File Prior to Visit  Medication Sig Dispense Refill  . albuterol (PROVENTIL HFA;VENTOLIN HFA) 108 (90 Base) MCG/ACT inhaler Inhale 2 puffs into the lungs every 6 (six) hours as needed for wheezing or shortness of breath. 1 Inhaler 2  . ALPRAZolam (XANAX) 0.5 MG tablet One or 2 tablets po 3 times daily as needed for anxiety 180 tablet 0  . benzonatate (TESSALON) 100 MG capsule Take 1 capsule (100 mg total) by mouth 3 (three) times daily as needed for cough. 30 capsule 1  . eszopiclone (LUNESTA) 2 MG TABS tablet Take 1 tablet (2 mg total) by mouth at bedtime as needed for sleep. Take immediately before bedtime 30 tablet 2  . levofloxacin (LEVAQUIN) 500 MG tablet Take 1 tablet (500 mg total) by mouth daily. 10 tablet 0  .  levonorgestrel (MIRENA) 20 MCG/24HR IUD 1 each by Intrauterine route once.    . Melatonin 3 MG TABS Take by mouth.    . TRUEplus Lancets 30G MISC Inject 1 Stick as directed daily. 100 each 0   No current facility-administered medications on file prior to visit.     PAST MEDICAL HISTORY: Past Medical History:  Diagnosis Date  . Anxiety   . Back pain   . Chest pain   . Depression   . Diabetes mellitus   . Diarrhea   . Edema    feet and legs  . Gallbladder problem   . 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 Heartburn   . HTN (hypertension)    no meds now  . Hyperlipidemia   . Obesity   .  Palpitations   . Parotid mass    right  . PMDD (premenstrual dysphoric disorder) 01/02/2016  . Shortness of breath   . Vitamin D deficiency   . Vitamin D deficiency     PAST SURGICAL HISTORY: Past Surgical History:  Procedure Laterality Date  . APPENDECTOMY    . CHOLECYSTECTOMY    . EUS N/A 03/23/2014   Procedure: ESOPHAGEAL ENDOSCOPIC ULTRASOUND (EUS) RADIAL;  Surgeon: Arta Silence, MD;  Location: [facility];  Service: Endoscopy;  Laterality: N/A;  . FINE NEEDLE ASPIRATION N/A 03/23/2014   Procedure: FINE NEEDLE ASPIRATION (FNA) LINEAR;  Surgeon: Arta Silence, MD;  Location: [facility];  Service: Endoscopy;  Laterality: N/A;  . PAROTIDECTOMY Right 04/02/2017   Procedure: RIGHT SUPERFICIAL PAROTIDECTOMY;  Surgeon: Jodi Marble, MD;  Location: [place];  Service: ENT;  Laterality: Right;  . TONSILLECTOMY      SOCIAL HISTORY: Social History   Tobacco Use  . Smoking status: Never Smoker  . Smokeless tobacco: Never Used  Substance Use Topics  . Alcohol use: No  . Drug use: No    Frequency: 3.0 times per week    FAMILY HISTORY: Family History  Problem Relation Age of Onset  . Healthy Mother   . Hypertension Mother   . Hyperlipidemia Mother   . Alcoholism Mother   . Anxiety disorder Mother   . Drug abuse Mother   . Healthy Father   . Colon cancer Paternal Grandfather 19       colon    ROS: Review of Systems  Cardiovascular: Negative for chest pain.  Gastrointestinal: Negative for nausea and vomiting.  Musculoskeletal:       Negative for muscle weakness.  Endo/Heme/Allergies:       Positive for hypoglycemia.  Psychiatric/Behavioral: Positive for depression.    PHYSICAL EXAM: Pt in no acute distress  RECENT LABS AND TESTS: BMET    Component Value Date/Time   NA 139 04/21/2018 1233   K 4.0 04/21/2018 1233   CL 106 04/21/2018 1233   CO2 20 04/21/2018 1233   GLUCOSE 100 (H) 04/21/2018 1233   GLUCOSE 102 (H) 03/31/2017 0945   BUN 14  04/21/2018 1233   CREATININE 0.64 04/21/2018 1233   CREATININE 0.68 02/27/2017 1601   CALCIUM 9.1 04/21/2018 1233   GFRNONAA 108 04/21/2018 1233   GFRNONAA 106 02/27/2017 1601   GFRAA 125 04/21/2018 1233   GFRAA 123 02/27/2017 1601   Lab Results  Component Value Date   HGBA1C 5.2 04/21/2018   HGBA1C 4.6 (L) 07/07/2017   HGBA1C 4.7 (L) 01/20/2017   HGBA1C 4.9 09/25/2016   HGBA1C 5.5 04/18/2016   Lab Results  Component Value Date  INSULIN 8.2 04/21/2018   INSULIN 8.1 07/07/2017   INSULIN 11.0 01/20/2017   INSULIN 11.7 09/25/2016   INSULIN 21.8 04/18/2016   CBC    Component Value Date/Time   WBC 9.8 06/13/2017 1607   RBC 4.27 06/13/2017 1607   HGB 13.2 06/13/2017 1607   HGB 13.9 03/20/2017 0930   HCT 37.7 06/13/2017 1607   HCT 42.2 03/20/2017 0930   PLT 288 06/13/2017 1607   PLT 392 (H) 04/18/2016 1201   MCV 88.3 06/13/2017 1607   MCV 94 03/20/2017 0930   MCH 30.9 06/13/2017 1607   MCHC 35.0 06/13/2017 1607   RDW 12.0 06/13/2017 1607   RDW 13.2 03/20/2017 0930   LYMPHSABS 2,264 06/13/2017 1607   LYMPHSABS 1.7 03/20/2017 0930   MONOABS 0.6 05/05/2014 0902   EOSABS 88 06/13/2017 1607   EOSABS 0.1 03/20/2017 0930   BASOSABS 29 06/13/2017 1607   BASOSABS 0.0 03/20/2017 0930   Iron/TIBC/Ferritin/ %Sat No results found for: IRON, TIBC, FERRITIN, IRONPCTSAT Lipid Panel     Component Value Date/Time   CHOL 181 04/21/2018 1233   TRIG 35 04/21/2018 1233   HDL 57 04/21/2018 1233   CHOLHDL 3.4 05/05/2014 0902   VLDL 16 05/05/2014 0902   LDLCALC 117 (H) 04/21/2018 1233   Hepatic Function Panel     Component Value Date/Time   PROT 6.8 04/21/2018 1233   ALBUMIN 4.3 04/21/2018 1233   AST 13 04/21/2018 1233   ALT 14 04/21/2018 1233   ALKPHOS 74 04/21/2018 1233   BILITOT 0.4 04/21/2018 1233      Component Value Date/Time   TSH 3.180 03/20/2017 0930   TSH 3.260 09/25/2016 0811   TSH 2.090 04/18/2016 1201    Results for NISHI, NEISWONGER (MRN [id number]) as  of 04/29/2018 06:21  Ref. Range 04/21/2018 12:33  Vitamin D, 25-Hydroxy Latest Ref Range: 30.0 - 100.0 ng/mL 37.7    I, Marcille Blanco, CMA, am acting as transcriptionist for Starlyn Skeans, MD I have reviewed the above documentation for accuracy and completeness, and I agree with the above. -Dennard Nip, MD

## 2018-05-05 DIAGNOSIS — F411 Generalized anxiety disorder: Secondary | ICD-10-CM | POA: Diagnosis not present

## 2018-05-07 MED FILL — BUPROPION HCL SR 150 MG TAB: 150 | 30 days supply | Qty: 60 | Fill #0

## 2018-05-07 MED FILL — ESZOPICLONE 2 MG TAB: 2 | 30 days supply | Qty: 30 | Fill #1

## 2018-05-07 MED FILL — SAXENDA 18 MG/3 ML PEN: 18 | 30 days supply | Qty: 15 | Fill #0

## 2018-05-07 MED FILL — VIT D2 1.25 MG (50,000 UNIT: 1.25 MG | 30 days supply | Qty: 10 | Fill #0

## 2018-05-07 MED FILL — ATORVASTATIN 10 MG TABLET: 10 | 30 days supply | Qty: 30 | Fill #0

## 2018-05-12 DIAGNOSIS — F411 Generalized anxiety disorder: Secondary | ICD-10-CM | POA: Diagnosis not present

## 2018-05-19 ENCOUNTER — Ambulatory Visit (INDEPENDENT_AMBULATORY_CARE_PROVIDER_SITE_OTHER): Payer: 59 | Admitting: Family Medicine

## 2018-05-19 ENCOUNTER — Encounter (INDEPENDENT_AMBULATORY_CARE_PROVIDER_SITE_OTHER): Payer: Self-pay | Admitting: Family Medicine

## 2018-05-19 ENCOUNTER — Other Ambulatory Visit: Payer: Self-pay

## 2018-05-19 DIAGNOSIS — F3289 Other specified depressive episodes: Secondary | ICD-10-CM

## 2018-05-19 DIAGNOSIS — E119 Type 2 diabetes mellitus without complications: Secondary | ICD-10-CM

## 2018-05-19 DIAGNOSIS — E7849 Other hyperlipidemia: Secondary | ICD-10-CM

## 2018-05-19 DIAGNOSIS — Z6835 Body mass index (BMI) 35.0-35.9, adult: Secondary | ICD-10-CM | POA: Diagnosis not present

## 2018-05-19 DIAGNOSIS — E559 Vitamin D deficiency, unspecified: Secondary | ICD-10-CM | POA: Diagnosis not present

## 2018-05-19 DIAGNOSIS — F411 Generalized anxiety disorder: Secondary | ICD-10-CM | POA: Diagnosis not present

## 2018-05-19 MED ORDER — GLUCOSE BLOOD VI STRP: ORAL_STRIP | 0 refills | Status: DC

## 2018-05-19 MED ORDER — BUPROPION HCL ER (SR) 150 MG PO TB12: 150.0000 mg | ORAL_TABLET | Freq: Two times a day (BID) | ORAL | 0 refills | Status: DC

## 2018-05-19 MED ORDER — LIRAGLUTIDE -WEIGHT MANAGEMENT 18 MG/3ML ~~LOC~~ SOPN: 3.0000 mg | PEN_INJECTOR | Freq: Every day | SUBCUTANEOUS | 0 refills | Status: DC

## 2018-05-19 MED ORDER — INSULIN PEN NEEDLE 32G X 4 MM MISC: 1.0000 | Freq: Two times a day (BID) | 0 refills | Status: DC

## 2018-05-19 MED ORDER — VITAMIN D (ERGOCALCIFEROL) 1.25 MG (50000 UNIT) PO CAPS: 50000.0000 [IU] | ORAL_CAPSULE | ORAL | 0 refills | Status: DC

## 2018-05-19 MED ORDER — ATORVASTATIN CALCIUM 10 MG PO TABS: 10.0000 mg | ORAL_TABLET | Freq: Every day | ORAL | 0 refills | Status: DC

## 2018-05-19 NOTE — Progress Notes (Signed)
Office: [phone]  /  Fax: [phone] TeleHealth Visit:  Jasmine Martinez has verbally consented to this TeleHealth visit today. The patient is located at home, the provider is located at the [facility] office. The participants in this visit include the listed provider and patient. Jasmine Martinez was unable to use Webex today and the telehealth visit was conducted via telephone.  HPI:   Chief Complaint: OBESITY Jasmine Martinez is here to discuss her progress with her obesity treatment plan. She is on the lower carbohydrate, vegetable, and lean protein rich diet plan and is following her eating plan approximately 75 % of the time. She states she is exercising 0 minutes 0 times per week. Jasmine Martinez feels that she more back on track with her diet prescription. She isn't exercising much as she is working from home. Hunger is controlled and she is doing better with meal planning and prepping.  We were unable to weigh the patient today for this TeleHealth visit. She feels as if she has lost weight since her last visit. She has lost 55 lbs since starting treatment with [place].  Diabetes II Jasmine Martinez has a diagnosis of diabetes type II. Jasmine Martinez is doing well on her diet prescription. Polyphagia is controlled to the point that she sometimes forgets to eat and sometimes her blood sugar drops to the 60's. Her last A1c was 5.2 on 04/21/18. She has been working on intensive lifestyle modifications including diet, exercise, and weight loss to help control her blood glucose levels.  Hyperlipidemia Jasmine Martinez has hyperlipidemia and is stable on her diet and medications. She has been trying to improve her cholesterol levels with intensive lifestyle modification including a low saturated fat diet, exercise, and weight loss. She denies any chest pain or myalgias.  Vitamin D Deficiency Jasmine Martinez has a diagnosis of vitamin D deficiency. She is currently stable on vit D. Jasmine Martinez denies nausea, vomiting, or muscle  weakness.  Depression with emotional eating behaviors Jasmine Martinez's mood is stable. She is under stress due to work and [place]. She is struggling with emotional eating and using food for comfort to the extent that it is negatively impacting her health. She often snacks when she is not hungry. Jasmine Martinez sometimes feels she is out of control and then feels guilty that she made poor food choices. She has been working on behavior modification techniques to help reduce her emotional eating and has been somewhat successful. She denies insomnia.  ASSESSMENT AND PLAN:  Type 2 diabetes mellitus without complication, without long-term current use of insulin ([place]) - Plan: glucose blood (ACCU-CHEK GUIDE) test strip  Other hyperlipidemia - Plan: atorvastatin (LIPITOR) 10 MG tablet  Vitamin D deficiency - Plan: Vitamin D, Ergocalciferol, (DRISDOL) 1.25 MG (50000 UT) CAPS capsule  Other depression - with emotional eating - Plan: buPROPion (WELLBUTRIN SR) 150 MG 12 hr tablet  Class 2 severe obesity with serious comorbidity and body mass index (BMI) of 35.0 to 35.9 in adult, unspecified obesity type ([place]) - Plan: Liraglutide -Weight Management ([place]) 18 MG/3ML SOPN, Insulin Pen Needle (BD PEN NEEDLE NANO 2ND GEN) 32G X 4 MM MISC  PLAN:  Diabetes II Jasmine Martinez has been given extensive diabetes education by myself today including ideal fasting and post-prandial blood glucose readings, individual ideal Hgb A1c goals, and hypoglycemia prevention. We discussed the importance of good blood sugar control to decrease the likelihood of diabetic complications such as nephropathy, neuropathy, limb loss, blindness, coronary artery disease, and death. We discussed the importance of intensive lifestyle modification including diet,  exercise, and weight loss as the first line treatment for diabetes. Jasmine Martinez agrees to continue her Saxenda 3 mg qd #5 pens, test strips #100, and needles #100 with no refills. Jasmine Martinez will follow up  at the agreed upon time in 2 weeks.  Hyperlipidemia Jasmine Martinez was informed of the American Heart Association Guidelines emphasizing intensive lifestyle modifications as the first line treatment for hyperlipidemia. We discussed many lifestyle modifications today in depth, and Jasmine Martinez will continue to work on decreasing saturated fats such as fatty red meat, butter and many fried foods. She will also increase vegetables and lean protein in her diet and continue to work on exercise and weight loss efforts. Jasmine Martinez agrees to continue Lipitor 10 mg qd #30 with no refills and to follow up as directed.  Vitamin D Deficiency Jasmine Martinez was informed that low vitamin D levels contribute to fatigue and are associated with obesity, breast, and colon cancer. Jasmine Martinez agrees to continue to take prescription Vit D @50 ,000 IU every 3 days #10 with no refills and will follow up for routine testing of vitamin D, at least 2-3 times per year. She was informed of the risk of over-replacement of vitamin D and agrees to not increase her dose unless she discusses this with [place] first. Jasmine Martinez agrees to follow up in 2 weeks as directed.  Depression with Emotional Eating Behaviors We discussed behavior modification techniques today to help Jasmine Martinez deal with her emotional eating and depression. She has agreed to take Wellbutrin SR 150 mg BID #60 with no refills and agreed to follow up as directed.  Obesity Jasmine Martinez is currently in the action stage of change. As such, her goal is to continue with weight loss efforts. She has agreed to follow a lower carbohydrate, vegetable, and lean protein rich diet plan. Jasmine Martinez has been instructed to work up to a goal of 150 minutes of combined cardio and strengthening exercise per week for weight loss and overall health benefits. We discussed the following Behavioral Modification Strategies today: increasing vegetables, increase H2O intake, and work on meal planning and easy cooking plans   Jasmine Martinez has agreed to follow up with our clinic in 2 weeks. She was informed of the importance of frequent follow up visits to maximize her success with intensive lifestyle modifications for her multiple health conditions.  ALLERGIES: Allergies  Allergen Reactions  . Codeine Shortness Of Breath  . Hydrocodone Shortness Of Breath    Mild SOB    MEDICATIONS: Current Outpatient Medications on File Prior to Visit  Medication Sig Dispense Refill  . albuterol (PROVENTIL HFA;VENTOLIN HFA) 108 (90 Base) MCG/ACT inhaler Inhale 2 puffs into the lungs every 6 (six) hours as needed for wheezing or shortness of breath. 1 Inhaler 2  . ALPRAZolam (XANAX) 0.5 MG tablet One or 2 tablets po 3 times daily as needed for anxiety 180 tablet 0  . benzonatate (TESSALON) 100 MG capsule Take 1 capsule (100 mg total) by mouth 3 (three) times daily as needed for cough. 30 capsule 1  . eszopiclone (LUNESTA) 2 MG TABS tablet Take 1 tablet (2 mg total) by mouth at bedtime as needed for sleep. Take immediately before bedtime 30 tablet 2  . levofloxacin (LEVAQUIN) 500 MG tablet Take 1 tablet (500 mg total) by mouth daily. 10 tablet 0  . levonorgestrel (MIRENA) 20 MCG/24HR IUD 1 each by Intrauterine route once.    . Melatonin 3 MG TABS Take by mouth.    . TRUEplus Lancets 30G MISC Inject 1 Stick as directed daily.  100 each 0   No current facility-administered medications on file prior to visit.     PAST MEDICAL HISTORY: Past Medical History:  Diagnosis Date  . Anxiety   . Back pain   . Chest pain   . Depression   . Diabetes mellitus   . Diarrhea   . Edema    feet and legs  . Gallbladder problem   . 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 Heartburn   . HTN (hypertension)    no meds now  . Hyperlipidemia   . Obesity   . Palpitations   . Parotid mass    right  . PMDD (premenstrual dysphoric disorder) 01/02/2016  . Shortness of breath   . Vitamin D deficiency   . Vitamin D  deficiency     PAST SURGICAL HISTORY: Past Surgical History:  Procedure Laterality Date  . APPENDECTOMY    . CHOLECYSTECTOMY    . EUS N/A 03/23/2014   Procedure: ESOPHAGEAL ENDOSCOPIC ULTRASOUND (EUS) RADIAL;  Surgeon: Arta Silence, MD;  Location: [facility];  Service: Endoscopy;  Laterality: N/A;  . FINE NEEDLE ASPIRATION N/A 03/23/2014   Procedure: FINE NEEDLE ASPIRATION (FNA) LINEAR;  Surgeon: Arta Silence, MD;  Location[facility];  Service: Endoscopy;  Laterality: N/A;  . PAROTIDECTOMY Right 04/02/2017   Procedure: RIGHT SUPERFICIAL PAROTIDECTOMY;  Surgeon: Jodi Marble, MD;  Location: [place];  Service: ENT;  Laterality: Right;  . TONSILLECTOMY      SOCIAL HISTORY: Social History   Tobacco Use  . Smoking status: Never Smoker  . Smokeless tobacco: Never Used  Substance Use Topics  . Alcohol use: No  . Drug use: No    Frequency: 3.0 times per week    FAMILY HISTORY: Family History  Problem Relation Age of Onset  . Healthy Mother   . Hypertension Mother   . Hyperlipidemia Mother   . Alcoholism Mother   . Anxiety disorder Mother   . Drug abuse Mother   . Healthy Father   . Colon cancer Paternal Grandfather 22       colon    ROS: Review of Systems  Cardiovascular: Negative for chest pain.  Gastrointestinal: Negative for nausea and vomiting.  Musculoskeletal: Negative for myalgias.       Negative for muscle weakness.  Endo/Heme/Allergies:       Positive for polyphagia.  Psychiatric/Behavioral: Positive for depression. The patient does not have insomnia.     PHYSICAL EXAM: Pt in no acute distress  RECENT LABS AND TESTS: BMET    Component Value Date/Time   NA 139 04/21/2018 1233   K 4.0 04/21/2018 1233   CL 106 04/21/2018 1233   CO2 20 04/21/2018 1233   GLUCOSE 100 (H) 04/21/2018 1233   GLUCOSE 102 (H) 03/31/2017 0945   BUN 14 04/21/2018 1233   CREATININE 0.64 04/21/2018 1233   CREATININE 0.68 02/27/2017 1601   CALCIUM 9.1  04/21/2018 1233   GFRNONAA 108 04/21/2018 1233   GFRNONAA 106 02/27/2017 1601   GFRAA 125 04/21/2018 1233   GFRAA 123 02/27/2017 1601   Lab Results  Component Value Date   HGBA1C 5.2 04/21/2018   HGBA1C 4.6 (L) 07/07/2017   HGBA1C 4.7 (L) 01/20/2017   HGBA1C 4.9 09/25/2016   HGBA1C 5.5 04/18/2016   Lab Results  Component Value Date   INSULIN 8.2 04/21/2018   INSULIN 8.1 07/07/2017   INSULIN 11.0 01/20/2017   INSULIN 11.7 09/25/2016   INSULIN 21.8 04/18/2016  CBC    Component Value Date/Time   WBC 9.8 06/13/2017 1607   RBC 4.27 06/13/2017 1607   HGB 13.2 06/13/2017 1607   HGB 13.9 03/20/2017 0930   HCT 37.7 06/13/2017 1607   HCT 42.2 03/20/2017 0930   PLT 288 06/13/2017 1607   PLT 392 (H) 04/18/2016 1201   MCV 88.3 06/13/2017 1607   MCV 94 03/20/2017 0930   MCH 30.9 06/13/2017 1607   MCHC 35.0 06/13/2017 1607   RDW 12.0 06/13/2017 1607   RDW 13.2 03/20/2017 0930   LYMPHSABS 2,264 06/13/2017 1607   LYMPHSABS 1.7 03/20/2017 0930   MONOABS 0.6 05/05/2014 0902   EOSABS 88 06/13/2017 1607   EOSABS 0.1 03/20/2017 0930   BASOSABS 29 06/13/2017 1607   BASOSABS 0.0 03/20/2017 0930   Iron/TIBC/Ferritin/ %Sat No results found for: IRON, TIBC, FERRITIN, IRONPCTSAT Lipid Panel     Component Value Date/Time   CHOL 181 04/21/2018 1233   TRIG 35 04/21/2018 1233   HDL 57 04/21/2018 1233   CHOLHDL 3.4 05/05/2014 0902   VLDL 16 05/05/2014 0902   LDLCALC 117 (H) 04/21/2018 1233   Hepatic Function Panel     Component Value Date/Time   PROT 6.8 04/21/2018 1233   ALBUMIN 4.3 04/21/2018 1233   AST 13 04/21/2018 1233   ALT 14 04/21/2018 1233   ALKPHOS 74 04/21/2018 1233   BILITOT 0.4 04/21/2018 1233      Component Value Date/Time   TSH 3.180 03/20/2017 0930   TSH 3.260 09/25/2016 0811   TSH 2.090 04/18/2016 1201    Results for Jasmine, Martinez (MRN [id number]) as of 05/19/2018 15:36  Ref. Range 04/21/2018 12:33  Vitamin D, 25-Hydroxy Latest Ref Range: 30.0 - 100.0  ng/mL 37.7    I, Marcille Blanco, CMA, am acting as transcriptionist for Starlyn Skeans, MD I have reviewed the above documentation for accuracy and completeness, and I agree with the above. -Dennard Nip, MD

## 2018-05-26 DIAGNOSIS — F411 Generalized anxiety disorder: Secondary | ICD-10-CM | POA: Diagnosis not present

## 2018-06-02 ENCOUNTER — Other Ambulatory Visit (INDEPENDENT_AMBULATORY_CARE_PROVIDER_SITE_OTHER): Payer: Self-pay | Admitting: Family Medicine

## 2018-06-02 ENCOUNTER — Other Ambulatory Visit: Payer: Self-pay | Admitting: Internal Medicine

## 2018-06-02 DIAGNOSIS — F411 Generalized anxiety disorder: Secondary | ICD-10-CM | POA: Diagnosis not present

## 2018-06-02 DIAGNOSIS — E119 Type 2 diabetes mellitus without complications: Secondary | ICD-10-CM

## 2018-06-02 MED FILL — UNIFINE PENTIPS 32GX5/32: 32G X 4 MM | 50 days supply | Qty: 100 | Fill #0

## 2018-06-02 MED FILL — ESZOPICLONE 2 MG TABLET: 2 | 30 days supply | Qty: 30 | Fill #0

## 2018-06-02 MED FILL — VIT D2 1.25 MG (50,000 UNIT: 1.25 MG | 30 days supply | Qty: 10 | Fill #0

## 2018-06-02 MED FILL — BUPROPION HCL SR 150 MG TAB: 150 | 30 days supply | Qty: 60 | Fill #0

## 2018-06-02 MED FILL — ATORVASTATIN 10 MG TABLET: 10 | 30 days supply | Qty: 30 | Fill #0

## 2018-06-02 MED FILL — SAXENDA 18 MG/3 ML PEN: 18 | 30 days supply | Qty: 15 | Fill #0

## 2018-06-02 MED FILL — UNIFINE PENTIPS 32GX5/32": 32G X 4 MM | 50 days supply | Qty: 100 | Fill #0

## 2018-06-04 ENCOUNTER — Encounter (INDEPENDENT_AMBULATORY_CARE_PROVIDER_SITE_OTHER): Payer: Self-pay | Admitting: Family Medicine

## 2018-06-04 ENCOUNTER — Ambulatory Visit (INDEPENDENT_AMBULATORY_CARE_PROVIDER_SITE_OTHER): Payer: 59 | Admitting: Family Medicine

## 2018-06-04 ENCOUNTER — Other Ambulatory Visit: Payer: Self-pay

## 2018-06-04 DIAGNOSIS — E7849 Other hyperlipidemia: Secondary | ICD-10-CM | POA: Diagnosis not present

## 2018-06-04 DIAGNOSIS — E669 Obesity, unspecified: Secondary | ICD-10-CM

## 2018-06-04 DIAGNOSIS — F3289 Other specified depressive episodes: Secondary | ICD-10-CM

## 2018-06-04 DIAGNOSIS — E559 Vitamin D deficiency, unspecified: Secondary | ICD-10-CM | POA: Diagnosis not present

## 2018-06-04 DIAGNOSIS — Z6834 Body mass index (BMI) 34.0-34.9, adult: Secondary | ICD-10-CM

## 2018-06-04 MED ORDER — ATORVASTATIN CALCIUM 10 MG PO TABS: 10.0000 mg | ORAL_TABLET | Freq: Every day | ORAL | 0 refills | Status: DC

## 2018-06-04 MED ORDER — VITAMIN D (ERGOCALCIFEROL) 1.25 MG (50000 UNIT) PO CAPS: 50000.0000 [IU] | ORAL_CAPSULE | ORAL | 0 refills | Status: DC

## 2018-06-04 MED ORDER — BUPROPION HCL ER (SR) 150 MG PO TB12: 150.0000 mg | ORAL_TABLET | Freq: Two times a day (BID) | ORAL | 0 refills | Status: DC

## 2018-06-04 MED ORDER — LIRAGLUTIDE -WEIGHT MANAGEMENT 18 MG/3ML ~~LOC~~ SOPN: 3.0000 mg | PEN_INJECTOR | Freq: Every day | SUBCUTANEOUS | 0 refills | Status: DC

## 2018-06-04 MED ORDER — INSULIN PEN NEEDLE 32G X 4 MM MISC: 1.0000 | Freq: Two times a day (BID) | 0 refills | Status: DC

## 2018-06-04 NOTE — Progress Notes (Signed)
Office: [phone]  /  Fax: [phone] TeleHealth Visit:  Jasmine Martinez has verbally consented to this TeleHealth visit today. The patient is located at home, the provider is located at the [employer] [facility] office. The participants in this visit include the listed provider and patient. [name] was unable to use realtime audiovisual technology today and the telehealth visit was conducted via telephone.  HPI:   Chief Complaint: OBESITY Jasmine Martinez is here to discuss her progress with her obesity treatment plan. She is on the lower carbohydrate, vegetable, and lean protein rich diet plan and is following her eating plan approximately 25 % of the time. She states she is exercising 0 minutes 0 times per week. Jasmine Martinez is doing well maintaining her weight. She isn't doing low carb, but is still being mindful. Her hunger is controlled, but cravings are sometimes an issue.  We were unable to weigh the patient today for this TeleHealth visit. She feels as if she has maintained weight since her last visit. She has lost 55 lbs since starting treatment with [place].  Hyperlipidemia Jasmine Martinez has hyperlipidemia and she is stable on Lipitor. She has been trying to improve her cholesterol levels with intensive lifestyle modification including a low saturated fat diet, exercise, and weight loss. She denies any chest pain.  Depression with emotional eating behaviors Jasmine Martinez's mood is stable on Wellbutrin. She has been going through multiple stressors at home and at work, but seems to be handling the stress reasonably. She is struggling with emotional eating and using food for comfort to the extent that it is negatively impacting her health. She often snacks when she is not hungry. [name] sometimes feels she is out of control and then feels guilty that she made poor food choices. She has been working on behavior modification techniques to help reduce her emotional eating and has been somewhat  successful. She shows no sign of suicidal or homicidal ideations.  Vitamin D Deficiency Jasmine Martinez has a diagnosis of vitamin D deficiency. She is currently stable on vit D. Jasmine Martinez denies nausea, vomiting, or muscle weakness.  ASSESSMENT AND PLAN:  Other hyperlipidemia - Plan: atorvastatin (LIPITOR) 10 MG tablet  Vitamin D deficiency - Plan: Vitamin D, Ergocalciferol, (DRISDOL) 1.25 MG (50000 UT) CAPS capsule  Other depression - with emotional eating - Plan: buPROPion (WELLBUTRIN SR) 150 MG 12 hr tablet  Class 1 obesity with serious comorbidity and body mass index (BMI) of 34.0 to 34.9 in adult, unspecified obesity type  PLAN:  Hyperlipidemia Leilynn was informed of the American Heart Association Guidelines emphasizing intensive lifestyle modifications as the first line treatment for hyperlipidemia. We discussed many lifestyle modifications today in depth, and Jasmine Martinez will continue to work on decreasing saturated fats such as fatty red meat, butter and many fried foods. She will also increase vegetables and lean protein in her diet and continue to work on exercise and weight loss efforts. [name] agrees to continue Lipitor 10 mg qd #30 with no refills and will follow up in 3 weeks.  Vitamin D Deficiency Jasmine Martinez was informed that low vitamin D levels contribute to fatigue and are associated with obesity, breast, and colon cancer. Jasmine Martinez agrees to continue to take prescription Vit D @50 ,000 IU every 3 days #10 with no refills and will follow up for routine testing of vitamin D, at least 2-3 times per year. She was informed of the risk of over-replacement of vitamin D and agrees to not increase her dose unless she discusses this with [place] first.  [name] agrees to follow up in 3 weeks as directed.  Depression with Emotional Eating Behaviors We discussed behavior modification techniques today to help Jasmine Martinez deal with her emotional eating and depression. She has agreed to take Wellbutrin SR  150 mg BID #60 with no refills and agreed to follow up as directed.  Obesity Jasmine Martinez is currently in the action stage of change. As such, her goal is to continue with weight loss efforts. She has agreed to follow a lower carbohydrate, vegetable, and lean protein rich diet plan. [name] has been instructed to work up to a goal of 150 minutes of combined cardio and strengthening exercise per week for weight loss and overall health benefits. We discussed the following Behavioral Modification Strategies today: emotional eating strategies. Jasmine Martinez agrees to continue taking Saxenda 3 mg qd #5 pens and pen needles #100 with no refills. She agrees to follow up at the agreed upon time.  Jasmine Martinez has agreed to follow up with our clinic in 3 weeks. She was informed of the importance of frequent follow up visits to maximize her success with intensive lifestyle modifications for her multiple health conditions.  I spent > than 50% of the 25 minute visit on counseling as documented in the note.   ALLERGIES: Allergies  Allergen Reactions  . Codeine Shortness Of Breath  . Hydrocodone Shortness Of Breath    Mild SOB    MEDICATIONS: Current Outpatient Medications on File Prior to Visit  Medication Sig Dispense Refill  . albuterol (PROVENTIL HFA;VENTOLIN HFA) 108 (90 Base) MCG/ACT inhaler Inhale 2 puffs into the lungs every 6 (six) hours as needed for wheezing or shortness of breath. 1 Inhaler 2  . ALPRAZolam (XANAX) 0.5 MG tablet One or 2 tablets po 3 times daily as needed for anxiety 180 tablet 0  . eszopiclone (LUNESTA) 2 MG TABS tablet TAKE 1 TABLET (2 MG TOTAL) BY MOUTH AT BEDTIME AS NEEDED FOR SLEEP. TAKE IMMEDIATELY BEFORE BEDTIME 30 tablet 1  . glucose blood (ACCU-CHEK GUIDE) test strip Use as instructed 100 each 0  . Insulin Pen Needle (BD PEN NEEDLE NANO 2ND GEN) 32G X 4 MM MISC 1 Package by Does not apply route 2 (two) times daily. 100 each 0  . levonorgestrel (MIRENA) 20 MCG/24HR IUD 1 each  by Intrauterine route once.    . Liraglutide -Weight Management (SAXENDA) 18 MG/3ML SOPN Inject 3 mg into the skin daily. 5 pen 0  . Melatonin 3 MG TABS Take by mouth.    . TRUEplus Lancets 30G MISC Inject 1 Stick as directed daily. 100 each 0   No current facility-administered medications on file prior to visit.     PAST MEDICAL HISTORY: Past Medical History:  Diagnosis Date  . Anxiety   . Back pain   . Chest pain   . Depression   . Diabetes mellitus   . Diarrhea   . Edema    feet and legs  . Gallbladder problem   . 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 Heartburn   . HTN (hypertension)    no meds now  . Hyperlipidemia   . Obesity   . Palpitations   . Parotid mass    right  . PMDD (premenstrual dysphoric disorder) 01/02/2016  . Shortness of breath   . Vitamin D deficiency   . Vitamin D deficiency     PAST SURGICAL HISTORY: Past Surgical History:  Procedure Laterality Date  . APPENDECTOMY    . CHOLECYSTECTOMY    .  EUS N/A 03/23/2014   Procedure: ESOPHAGEAL ENDOSCOPIC ULTRASOUND (EUS) RADIAL;  Surgeon: Arta Silence, MD;  Location: [facility];  Service: Endoscopy;  Laterality: N/A;  . FINE NEEDLE ASPIRATION N/A 03/23/2014   Procedure: FINE NEEDLE ASPIRATION (FNA) LINEAR;  Surgeon: Arta Silence, MD;  Location: [facility];  Service: Endoscopy;  Laterality: N/A;  . PAROTIDECTOMY Right 04/02/2017   Procedure: RIGHT SUPERFICIAL PAROTIDECTOMY;  Surgeon: Jodi Marble, MD;  Location: [place];  Service: ENT;  Laterality: Right;  . TONSILLECTOMY      SOCIAL HISTORY: Social History   Tobacco Use  . Smoking status: Never Smoker  . Smokeless tobacco: Never Used  Substance Use Topics  . Alcohol use: No  . Drug use: No    Frequency: 3.0 times per week    FAMILY HISTORY: Family History  Problem Relation Age of Onset  . Healthy Mother   . Hypertension Mother   . Hyperlipidemia Mother   . Alcoholism Mother   . Anxiety  disorder Mother   . Drug abuse Mother   . Healthy Father   . Colon cancer Paternal Grandfather 87       colon    ROS: Review of Systems  Cardiovascular: Negative for chest pain.  Psychiatric/Behavioral: Positive for depression. Negative for suicidal ideas.       Negative for homicidal ideations.    PHYSICAL EXAM: Pt in no acute distress  RECENT LABS AND TESTS: BMET    Component Value Date/Time   NA 139 04/21/2018 1233   K 4.0 04/21/2018 1233   CL 106 04/21/2018 1233   CO2 20 04/21/2018 1233   GLUCOSE 100 (H) 04/21/2018 1233   GLUCOSE 102 (H) 03/31/2017 0945   BUN 14 04/21/2018 1233   CREATININE 0.64 04/21/2018 1233   CREATININE 0.68 02/27/2017 1601   CALCIUM 9.1 04/21/2018 1233   GFRNONAA 108 04/21/2018 1233   GFRNONAA 106 02/27/2017 1601   GFRAA 125 04/21/2018 1233   GFRAA 123 02/27/2017 1601   Lab Results  Component Value Date   HGBA1C 5.2 04/21/2018   HGBA1C 4.6 (L) 07/07/2017   HGBA1C 4.7 (L) 01/20/2017   HGBA1C 4.9 09/25/2016   HGBA1C 5.5 04/18/2016   Lab Results  Component Value Date   INSULIN 8.2 04/21/2018   INSULIN 8.1 07/07/2017   INSULIN 11.0 01/20/2017   INSULIN 11.7 09/25/2016   INSULIN 21.8 04/18/2016   CBC    Component Value Date/Time   WBC 9.8 06/13/2017 1607   RBC 4.27 06/13/2017 1607   HGB 13.2 06/13/2017 1607   HGB 13.9 03/20/2017 0930   HCT 37.7 06/13/2017 1607   HCT 42.2 03/20/2017 0930   PLT 288 06/13/2017 1607   PLT 392 (H) 04/18/2016 1201   MCV 88.3 06/13/2017 1607   MCV 94 03/20/2017 0930   MCH 30.9 06/13/2017 1607   MCHC 35.0 06/13/2017 1607   RDW 12.0 06/13/2017 1607   RDW 13.2 03/20/2017 0930   LYMPHSABS 2,264 06/13/2017 1607   LYMPHSABS 1.7 03/20/2017 0930   MONOABS 0.6 05/05/2014 0902   EOSABS 88 06/13/2017 1607   EOSABS 0.1 03/20/2017 0930   BASOSABS 29 06/13/2017 1607   BASOSABS 0.0 03/20/2017 0930   Iron/TIBC/Ferritin/ %Sat No results found for: IRON, TIBC, FERRITIN, IRONPCTSAT Lipid Panel     Component  Value Date/Time   CHOL 181 04/21/2018 1233   TRIG 35 04/21/2018 1233   HDL 57 04/21/2018 1233   CHOLHDL 3.4 05/05/2014 0902   VLDL 16 05/05/2014 0902   LDLCALC 117 (H)  04/21/2018 1233   Hepatic Function Panel     Component Value Date/Time   PROT 6.8 04/21/2018 1233   ALBUMIN 4.3 04/21/2018 1233   AST 13 04/21/2018 1233   ALT 14 04/21/2018 1233   ALKPHOS 74 04/21/2018 1233   BILITOT 0.4 04/21/2018 1233      Component Value Date/Time   TSH 3.180 03/20/2017 0930   TSH 3.260 09/25/2016 0811   TSH 2.090 04/18/2016 1201   Results for RUDELL, MARLOWE (MRN [id number]) as of 06/04/2018 15:35  Ref. Range 04/21/2018 12:33  Vitamin D, 25-Hydroxy Latest Ref Range: 30.0 - 100.0 ng/mL 37.7     I, Marcille Blanco, CMA, am acting as transcriptionist for Starlyn Skeans, MD I have reviewed the above documentation for accuracy and completeness, and I agree with the above. -Dennard Nip, MD

## 2018-06-11 DIAGNOSIS — F411 Generalized anxiety disorder: Secondary | ICD-10-CM | POA: Diagnosis not present

## 2018-06-17 DIAGNOSIS — F411 Generalized anxiety disorder: Secondary | ICD-10-CM | POA: Diagnosis not present

## 2018-06-19 ENCOUNTER — Other Ambulatory Visit (INDEPENDENT_AMBULATORY_CARE_PROVIDER_SITE_OTHER): Payer: Self-pay | Admitting: Family Medicine

## 2018-06-19 DIAGNOSIS — Z6834 Body mass index (BMI) 34.0-34.9, adult: Secondary | ICD-10-CM

## 2018-06-19 DIAGNOSIS — E669 Obesity, unspecified: Secondary | ICD-10-CM

## 2018-06-23 DIAGNOSIS — F411 Generalized anxiety disorder: Secondary | ICD-10-CM | POA: Diagnosis not present

## 2018-06-25 ENCOUNTER — Other Ambulatory Visit: Payer: Self-pay

## 2018-06-25 ENCOUNTER — Ambulatory Visit (INDEPENDENT_AMBULATORY_CARE_PROVIDER_SITE_OTHER): Payer: 59 | Admitting: Family Medicine

## 2018-06-25 ENCOUNTER — Encounter (INDEPENDENT_AMBULATORY_CARE_PROVIDER_SITE_OTHER): Payer: Self-pay | Admitting: Family Medicine

## 2018-06-25 DIAGNOSIS — E7849 Other hyperlipidemia: Secondary | ICD-10-CM | POA: Diagnosis not present

## 2018-06-25 DIAGNOSIS — E559 Vitamin D deficiency, unspecified: Secondary | ICD-10-CM | POA: Diagnosis not present

## 2018-06-25 DIAGNOSIS — E669 Obesity, unspecified: Secondary | ICD-10-CM

## 2018-06-25 DIAGNOSIS — F3289 Other specified depressive episodes: Secondary | ICD-10-CM

## 2018-06-25 DIAGNOSIS — Z6834 Body mass index (BMI) 34.0-34.9, adult: Secondary | ICD-10-CM

## 2018-06-25 MED ORDER — SAXENDA 18 MG/3ML ~~LOC~~ SOPN: 3.0000 mg | PEN_INJECTOR | Freq: Every day | SUBCUTANEOUS | 0 refills | Status: DC

## 2018-06-25 MED ORDER — VITAMIN D (ERGOCALCIFEROL) 1.25 MG (50000 UNIT) PO CAPS: 50000.0000 [IU] | ORAL_CAPSULE | ORAL | 0 refills | Status: DC

## 2018-06-25 MED ORDER — ATORVASTATIN CALCIUM 10 MG PO TABS: 10.0000 mg | ORAL_TABLET | Freq: Every day | ORAL | 0 refills | Status: DC

## 2018-06-25 MED ORDER — BUPROPION HCL ER (SR) 150 MG PO TB12: 150.0000 mg | ORAL_TABLET | Freq: Two times a day (BID) | ORAL | 0 refills | Status: DC

## 2018-06-26 MED FILL — SAXENDA 18 MG/3 ML PEN: 18 | 30 days supply | Qty: 15 | Fill #0

## 2018-06-26 MED FILL — VIT D2 1.25 MG (50,000 UNIT: 1.25 MG | 30 days supply | Qty: 10 | Fill #0

## 2018-06-26 MED FILL — ATORVASTATIN 10 MG TABLET: 10 | 30 days supply | Qty: 30 | Fill #0

## 2018-06-26 MED FILL — BUPROPION HCL SR 150 MG TAB: 150 | 30 days supply | Qty: 60 | Fill #0

## 2018-06-30 DIAGNOSIS — F411 Generalized anxiety disorder: Secondary | ICD-10-CM | POA: Diagnosis not present

## 2018-06-30 NOTE — Progress Notes (Signed)
Office: [phone]  /  Fax: [phone] TeleHealth Visit:  Jasmine Martinez has verbally consented to this TeleHealth visit today. The patient is located at home, the provider is located at the [employer] [facility] office. The participants in this visit include the listed provider and patient and any and all parties involved. The visit was conducted today via telephone. Jasmine Martinez was unable to use realtime audiovisual technology today and the telehealth visit was conducted via telephone.  HPI:   Chief Complaint: OBESITY Jasmine Martinez is here to discuss her progress with her obesity treatment plan. She is on the lower carbohydrate, vegetable and lean protein rich diet plan and is following her eating plan approximately 75 % of the time. She states she is exercising 0 minutes 0 times per week. [name] continues to do well with maintaining her weight with the low carbohydrate plan. She has done some celebration eating, but she has been able to get back on track. We were unable to weigh the patient today for this TeleHealth visit. She feels as if she has maintained weight since her last visit. She has lost 55 lbs since starting treatment with [place].  Hyperlipidemia Jasmine Martinez has hyperlipidemia and her LDL has started to creep up. Her LDL was 117 in April. She has been trying to improve her cholesterol levels with intensive lifestyle modification including a low saturated fat diet, exercise and weight loss. She denies any chest pain, claudication or myalgias.  Vitamin D deficiency Jasmine Martinez has a diagnosis of vitamin D deficiency. Jasmine Martinez is currently taking vit D and she is not yet at goal. Jasmine Martinez denies nausea, vomiting or muscle weakness.  Depression with emotional eating behaviors Jasmine Martinez struggles with emotional eating and using food for comfort to the extent that it is negatively impacting her health. She often snacks when she is not hungry. Jasmine Martinez sometimes feels she is out of control  and then feels guilty that she made poor food choices. She has been working on behavior modification techniques to help reduce her emotional eating and has been somewhat successful. Her mood is stable. Jasmine Martinez is sleeping better and she feels, she is more in control of her emotional eating. She shows no sign of suicidal or homicidal ideations.  ASSESSMENT AND PLAN:  Vitamin D deficiency - Plan: Vitamin D, Ergocalciferol, (DRISDOL) 1.25 MG (50000 UT) CAPS capsule  Other hyperlipidemia - Plan: atorvastatin (LIPITOR) 10 MG tablet  Other depression - with emotional eating - Plan: buPROPion (WELLBUTRIN SR) 150 MG 12 hr tablet  Class 1 obesity with serious comorbidity and body mass index (BMI) of 34.0 to 34.9 in adult, unspecified obesity type - Plan: Liraglutide -Weight Management ([place]) 18 MG/3ML SOPN  PLAN:  Hyperlipidemia Jasmine Martinez was informed of the American Heart Association Guidelines emphasizing intensive lifestyle modifications as the first line treatment for hyperlipidemia. We discussed many lifestyle modifications today in depth, and Jasmine Martinez will continue to work on decreasing saturated fats such as fatty red meat, butter and many fried foods. She will also increase vegetables and lean protein in her diet and continue to work on exercise and weight loss efforts. Jasmine Martinez agrees to continue Lipitor 10 mg daily #30 with no refills and follow up as directed.  Vitamin D Deficiency Jasmine Martinez was informed that low vitamin D levels contributes to fatigue and are associated with obesity, breast, and colon cancer. She agrees to continue to take prescription Vit D @50 ,000 IU every 3 days #10 with no refills and will follow up for routine testing of vitamin  D, at least 2-3 times per year. She was informed of the risk of over-replacement of vitamin D and agrees to not increase her dose unless she discusses this with [place] first. Jasmine Martinez agrees to follow up as directed.  Depression with Emotional  Eating Behaviors We discussed behavior modification techniques today to help Jasmine Martinez deal with her emotional eating and depression. She has agreed to continue Wellbutrin SR 150 mg two times daily #60 with no refills and follow up as directed.  I spent > than 50% of the 25 minute visit on counseling as documented in the note.  Obesity Jasmine Martinez is currently in the action stage of change. As such, her goal is to continue with weight loss efforts She has agreed to follow a lower carbohydrate, vegetable and lean protein rich diet plan Jasmine Martinez has been instructed to work up to a goal of 150 minutes of combined cardio and strengthening exercise per week for weight loss and overall health benefits. We discussed the following Behavioral Modification Strategies today: keeping healthy foods in the home, dealing with family or coworker sabotage and emotional eating strategies  Jasmine Martinez agrees to continue Saxenda 3 mg daily #5 pens.  Jasmine Martinez has agreed to follow up with our clinic in 2 to 3 weeks. She was informed of the importance of frequent follow up visits to maximize her success with intensive lifestyle modifications for her multiple health conditions.  ALLERGIES: Allergies  Allergen Reactions  . Codeine Shortness Of Breath  . Hydrocodone Shortness Of Breath    Mild SOB    MEDICATIONS: Current Outpatient Medications on File Prior to Visit  Medication Sig Dispense Refill  . albuterol (PROVENTIL HFA;VENTOLIN HFA) 108 (90 Base) MCG/ACT inhaler Inhale 2 puffs into the lungs every 6 (six) hours as needed for wheezing or shortness of breath. 1 Inhaler 2  . ALPRAZolam (XANAX) 0.5 MG tablet One or 2 tablets po 3 times daily as needed for anxiety 180 tablet 0  . eszopiclone (LUNESTA) 2 MG TABS tablet TAKE 1 TABLET (2 MG TOTAL) BY MOUTH AT BEDTIME AS NEEDED FOR SLEEP. TAKE IMMEDIATELY BEFORE BEDTIME 30 tablet 1  . glucose blood (ACCU-CHEK GUIDE) test strip Use as instructed 100 each 0  . Insulin Pen  Needle (BD PEN NEEDLE NANO 2ND GEN) 32G X 4 MM MISC 1 Package by Does not apply route 2 (two) times daily. 100 each 0  . levonorgestrel (MIRENA) 20 MCG/24HR IUD 1 each by Intrauterine route once.    . Melatonin 3 MG TABS Take by mouth.    . TRUEplus Lancets 30G MISC Inject 1 Stick as directed daily. 100 each 0   No current facility-administered medications on file prior to visit.     PAST MEDICAL HISTORY: Past Medical History:  Diagnosis Date  . Anxiety   . Back pain   . Chest pain   . Depression   . Diabetes mellitus   . Diarrhea   . Edema    feet and legs  . Gallbladder problem   . 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 Heartburn   . HTN (hypertension)    no meds now  . Hyperlipidemia   . Obesity   . Palpitations   . Parotid mass    right  . PMDD (premenstrual dysphoric disorder) 01/02/2016  . Shortness of breath   . Vitamin D deficiency   . Vitamin D deficiency     PAST SURGICAL HISTORY: Past Surgical History:  Procedure Laterality Date  .  APPENDECTOMY    . CHOLECYSTECTOMY    . EUS N/A 03/23/2014   Procedure: ESOPHAGEAL ENDOSCOPIC ULTRASOUND (EUS) RADIAL;  Surgeon: Arta Silence, MD;  Location: [facility];  Service: Endoscopy;  Laterality: N/A;  . FINE NEEDLE ASPIRATION N/A 03/23/2014   Procedure: FINE NEEDLE ASPIRATION (FNA) LINEAR;  Surgeon: Arta Silence, MD;  Location: [facility];  Service: Endoscopy;  Laterality: N/A;  . PAROTIDECTOMY Right 04/02/2017   Procedure: RIGHT SUPERFICIAL PAROTIDECTOMY;  Surgeon: Jodi Marble, MD;  Location: [facility];  Service: ENT;  Laterality: Right;  . TONSILLECTOMY      SOCIAL HISTORY: Social History   Tobacco Use  . Smoking status: Never Smoker  . Smokeless tobacco: Never Used  Substance Use Topics  . Alcohol use: No  . Drug use: No    Frequency: 3.0 times per week    FAMILY HISTORY: Family History  Problem Relation Age of Onset  . Healthy Mother   . Hypertension  Mother   . Hyperlipidemia Mother   . Alcoholism Mother   . Anxiety disorder Mother   . Drug abuse Mother   . Healthy Father   . Colon cancer Paternal Grandfather 76       colon    ROS: Review of Systems  Constitutional: Negative for weight loss.  Cardiovascular: Negative for chest pain and claudication.  Gastrointestinal: Negative for nausea and vomiting.  Musculoskeletal: Negative for myalgias.       Negative for muscle weakness  Psychiatric/Behavioral: Positive for depression. Negative for suicidal ideas.    PHYSICAL EXAM: Pt in no acute distress  RECENT LABS AND TESTS: BMET    Component Value Date/Time   NA 139 04/21/2018 1233   K 4.0 04/21/2018 1233   CL 106 04/21/2018 1233   CO2 20 04/21/2018 1233   GLUCOSE 100 (H) 04/21/2018 1233   GLUCOSE 102 (H) 03/31/2017 0945   BUN 14 04/21/2018 1233   CREATININE 0.64 04/21/2018 1233   CREATININE 0.68 02/27/2017 1601   CALCIUM 9.1 04/21/2018 1233   GFRNONAA 108 04/21/2018 1233   GFRNONAA 106 02/27/2017 1601   GFRAA 125 04/21/2018 1233   GFRAA 123 02/27/2017 1601   Lab Results  Component Value Date   HGBA1C 5.2 04/21/2018   HGBA1C 4.6 (L) 07/07/2017   HGBA1C 4.7 (L) 01/20/2017   HGBA1C 4.9 09/25/2016   HGBA1C 5.5 04/18/2016   Lab Results  Component Value Date   INSULIN 8.2 04/21/2018   INSULIN 8.1 07/07/2017   INSULIN 11.0 01/20/2017   INSULIN 11.7 09/25/2016   INSULIN 21.8 04/18/2016   CBC    Component Value Date/Time   WBC 9.8 06/13/2017 1607   RBC 4.27 06/13/2017 1607   HGB 13.2 06/13/2017 1607   HGB 13.9 03/20/2017 0930   HCT 37.7 06/13/2017 1607   HCT 42.2 03/20/2017 0930   PLT 288 06/13/2017 1607   PLT 392 (H) 04/18/2016 1201   MCV 88.3 06/13/2017 1607   MCV 94 03/20/2017 0930   MCH 30.9 06/13/2017 1607   MCHC 35.0 06/13/2017 1607   RDW 12.0 06/13/2017 1607   RDW 13.2 03/20/2017 0930   LYMPHSABS 2,264 06/13/2017 1607   LYMPHSABS 1.7 03/20/2017 0930   MONOABS 0.6 05/05/2014 0902   EOSABS 88  06/13/2017 1607   EOSABS 0.1 03/20/2017 0930   BASOSABS 29 06/13/2017 1607   BASOSABS 0.0 03/20/2017 0930   Iron/TIBC/Ferritin/ %Sat No results found for: IRON, TIBC, FERRITIN, IRONPCTSAT Lipid Panel     Component Value Date/Time   CHOL 181 04/21/2018  1233   TRIG 35 04/21/2018 1233   HDL 57 04/21/2018 1233   CHOLHDL 3.4 05/05/2014 0902   VLDL 16 05/05/2014 0902   LDLCALC 117 (H) 04/21/2018 1233   Hepatic Function Panel     Component Value Date/Time   PROT 6.8 04/21/2018 1233   ALBUMIN 4.3 04/21/2018 1233   AST 13 04/21/2018 1233   ALT 14 04/21/2018 1233   ALKPHOS 74 04/21/2018 1233   BILITOT 0.4 04/21/2018 1233      Component Value Date/Time   TSH 3.180 03/20/2017 0930   TSH 3.260 09/25/2016 0811   TSH 2.090 04/18/2016 1201     Ref. Range 04/21/2018 12:33  Vitamin D, 25-Hydroxy Latest Ref Range: 30.0 - 100.0 ng/mL 37.7    I, Doreene Nest, am acting as Location manager for Dennard Nip, MD I have reviewed the above documentation for accuracy and completeness, and I agree with the above. -Dennard Nip, MD

## 2018-07-14 DIAGNOSIS — F411 Generalized anxiety disorder: Secondary | ICD-10-CM | POA: Diagnosis not present

## 2018-07-15 ENCOUNTER — Encounter (INDEPENDENT_AMBULATORY_CARE_PROVIDER_SITE_OTHER): Payer: Self-pay | Admitting: Family Medicine

## 2018-07-15 ENCOUNTER — Other Ambulatory Visit: Payer: Self-pay

## 2018-07-15 ENCOUNTER — Telehealth (INDEPENDENT_AMBULATORY_CARE_PROVIDER_SITE_OTHER): Payer: 59 | Admitting: Family Medicine

## 2018-07-15 DIAGNOSIS — E7849 Other hyperlipidemia: Secondary | ICD-10-CM | POA: Diagnosis not present

## 2018-07-15 DIAGNOSIS — E669 Obesity, unspecified: Secondary | ICD-10-CM | POA: Diagnosis not present

## 2018-07-15 DIAGNOSIS — Z6834 Body mass index (BMI) 34.0-34.9, adult: Secondary | ICD-10-CM | POA: Diagnosis not present

## 2018-07-15 DIAGNOSIS — E559 Vitamin D deficiency, unspecified: Secondary | ICD-10-CM

## 2018-07-15 DIAGNOSIS — F3289 Other specified depressive episodes: Secondary | ICD-10-CM

## 2018-07-15 MED ORDER — BUPROPION HCL ER (SR) 150 MG PO TB12: 150.0000 mg | ORAL_TABLET | Freq: Two times a day (BID) | ORAL | 0 refills | Status: DC

## 2018-07-15 MED ORDER — ATORVASTATIN CALCIUM 10 MG PO TABS: 10.0000 mg | ORAL_TABLET | Freq: Every day | ORAL | 0 refills | Status: DC

## 2018-07-15 MED ORDER — BD PEN NEEDLE NANO 2ND GEN 32G X 4 MM MISC: 1.0000 | Freq: Two times a day (BID) | 0 refills | Status: DC

## 2018-07-15 MED ORDER — VITAMIN D (ERGOCALCIFEROL) 1.25 MG (50000 UNIT) PO CAPS: 50000.0000 [IU] | ORAL_CAPSULE | ORAL | 0 refills | Status: DC

## 2018-07-15 MED ORDER — SAXENDA 18 MG/3ML ~~LOC~~ SOPN: 3.0000 mg | PEN_INJECTOR | Freq: Every day | SUBCUTANEOUS | 0 refills | Status: DC

## 2018-07-15 MED ORDER — ACCU-CHEK FASTCLIX LANCETS MISC: 1.0000 | Freq: Every day | 0 refills | Status: DC

## 2018-07-15 MED FILL — UNIFINE PENTIPS 32GX5/32: 32G X 4 MM | 50 days supply | Qty: 100 | Fill #0

## 2018-07-15 MED FILL — UNIFINE PENTIPS 32GX5/32": 32G X 4 MM | 50 days supply | Qty: 100 | Fill #0

## 2018-07-15 MED FILL — ACCU-CHEK FASTCLIX LANCETS: 90 days supply | Qty: 102 | Fill #0

## 2018-07-16 NOTE — Progress Notes (Signed)
Office: [phone]  /  Fax: [phone] TeleHealth Visit:  [name] has verbally consented to this TeleHealth visit today. The patient is located at home, the provider is located at the [employer] [facility] office. The participants in this visit include the listed provider and patient. The visit was conducted today via telephone call (FaceTime failed and changed to telephone call).  HPI:   Chief Complaint: OBESITY Jasmine Martinez is here to discuss her progress with her obesity treatment plan. She is on a lower carbohydrate, vegetable and lean protein rich diet plan and is following her eating plan approximately 75% of the time. She states she is exercising 0 minutes 0 times per week. Jasmine Martinez feels she is doing well maintaining her weight loss. Work has been very busy and she sometimes struggles to meal plan but states hunger is controlled.  We were unable to weigh the patient today for this TeleHealth visit. She feels as if she has maintained her weight since her last visit. She has lost 55 lbs since starting treatment with [place].  Hyperlipidemia Jasmine Martinez has hyperlipidemia and is stable on Lipitor. Her last LDL level was elevated at 117 on 04/21/2018. She has been trying to improve her cholesterol levels with intensive lifestyle modification including a low saturated fat diet, exercise and weight loss. She denies any chest pain or myalgias.  Vitamin D deficiency Jasmine Martinez has a diagnosis of Vitamin D deficiency, which is not yet at goal. Her last Vitamin D level was reported to be 37.7 on 04/21/2018. She is currently stable on prescription Vit D and denies nausea, vomiting or hypoglycemia.  Depression with emotional eating behaviors Jasmine Martinez is struggling with emotional eating and using food for comfort to the extent that it is negatively impacting her health. She often snacks when she is not hungry. Jasmine Martinez sometimes feels she is out of control and then feels guilty that she made  poor food choices. She has been working on behavior modification techniques to help reduce her emotional eating and has been somewhat successful. Jasmine Martinez states her mood is stable on Wellbutrin and reports doing well controlling her emotional eating. She shows no sign of suicidal or homicidal ideations. Denies insomnia.  Depression screen Jasmine Martinez 2/9 04/23/2016 01/02/2016 02/14/2015 06/23/2014  Decreased Interest 0 1 0 1  Down, Depressed, Hopeless 0 1 0 1  PHQ - 2 Score 0 2 0 2  Altered sleeping - 2 - 1  Tired, decreased energy - 3 - 1  Change in appetite - 3 - 0  Feeling bad or failure about yourself  - 3 - 0  Trouble concentrating - 0 - 0  Moving slowly or fidgety/restless - 0 - 0  Suicidal thoughts - 0 - 0  PHQ-9 Score - 13 - 4  Difficult doing work/chores - - - Not difficult at all   ASSESSMENT AND PLAN:  Other hyperlipidemia - Plan: atorvastatin (LIPITOR) 10 MG tablet  Other depression - with emotional eating - Plan: buPROPion (WELLBUTRIN SR) 150 MG 12 hr tablet  Vitamin D deficiency - Plan: Vitamin D, Ergocalciferol, (DRISDOL) 1.25 MG (50000 UT) CAPS capsule  Class 1 obesity with serious comorbidity and body mass index (BMI) of 34.0 to 34.9 in adult, unspecified obesity type - Plan: Insulin Pen Needle (BD PEN NEEDLE NANO 2ND GEN) 32G X 4 MM MISC, Liraglutide -Weight Management (SAXENDA) 18 MG/3ML SOPN  PLAN:  Hyperlipidemia Jasmine Martinez was informed of the American Heart Association Guidelines emphasizing intensive lifestyle modifications as the first line treatment for  hyperlipidemia. We discussed many lifestyle modifications today in depth, and Jasmine Martinez will continue to work on decreasing saturated fats such as fatty red meat, butter and many fried foods. Jasmine Martinez was given a refill on her Lipitor 10 mg #30 with 0 refills and she agrees to follow-up with our clinic in 2-3 weeks. She will also increase vegetables and lean protein in her diet and continue to work on exercise and weight loss  efforts. We will recheck labs in 1 month.  Vitamin D Deficiency Jasmine Martinez was informed that low Vitamin D levels contributes to fatigue and are associated with obesity, breast, and colon cancer. She agrees to continue to take prescription Vit D @ 50,000 IU every three days #10 with 0 refills and will follow-up for routine testing of Vitamin D, at least 2-3 times per year. She was informed of the risk of over-replacement of Vitamin D and agrees to not increase her dose unless she discusses this with [place] first. Jasmine Martinez agrees to follow-up with our clinic in 2-3 weeks.  Depression with Emotional Eating Behaviors We discussed behavior modification techniques today to help Jasmine Martinez deal with her emotional eating and depression. Jasmine Martinez was given a refill on her Wellbutrin 150 mg #60 with 0 refills and she agrees to follow-up with our clinic in 2-3 weeks.  I spent > than 50% of the 25 minute visit on counseling as documented in the note.  Obesity Jasmine Martinez is currently in the action stage of change. As such, her goal is to continue with weight loss efforts. She has agreed to follow a lower carbohydrate, vegetable and lean protein rich diet plan. Jasmine Martinez has been instructed to work up to a goal of 150 minutes of combined cardio and strengthening exercise per week for weight loss and overall health benefits. We discussed the following Behavioral Modification Strategies today: work on meal planning, easy cooking plans, ways to avoid boredom eating, and emotional eating strategies. Jasmine Martinez was given a refill on her Saxenda and lancets.  Jasmine Martinez has agreed to follow-up with our clinic in 2-3 weeks. She was informed of the importance of frequent follow-up visits to maximize her success with intensive lifestyle modifications for her multiple health conditions.  ALLERGIES: Allergies  Allergen Reactions   Codeine Shortness Of Breath   Hydrocodone Shortness Of Breath    Mild SOB     MEDICATIONS: Current Outpatient Medications on File Prior to Visit  Medication Sig Dispense Refill   albuterol (PROVENTIL HFA;VENTOLIN HFA) 108 (90 Base) MCG/ACT inhaler Inhale 2 puffs into the lungs every 6 (six) hours as needed for wheezing or shortness of breath. 1 Inhaler 2   ALPRAZolam (XANAX) 0.5 MG tablet One or 2 tablets po 3 times daily as needed for anxiety 180 tablet 0   eszopiclone (LUNESTA) 2 MG TABS tablet TAKE 1 TABLET (2 MG TOTAL) BY MOUTH AT BEDTIME AS NEEDED FOR SLEEP. TAKE IMMEDIATELY BEFORE BEDTIME 30 tablet 1   glucose blood (ACCU-CHEK GUIDE) test strip Use as instructed 100 each 0   levonorgestrel (MIRENA) 20 MCG/24HR IUD 1 each by Intrauterine route once.     Melatonin 3 MG TABS Take by mouth.     No current facility-administered medications on file prior to visit.     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   Heartburn    HTN (hypertension)    no meds now   Hyperlipidemia    Obesity    Palpitations    Parotid mass    right   PMDD (premenstrual dysphoric disorder) 01/02/2016   Shortness of breath    Vitamin D deficiency    Vitamin D deficiency     PAST SURGICAL HISTORY: Past Surgical History:  Procedure Laterality Date   APPENDECTOMY     CHOLECYSTECTOMY     EUS N/A 03/23/2014   Procedure: ESOPHAGEAL ENDOSCOPIC ULTRASOUND (EUS) RADIAL;  Surgeon: Arta Silence, MD;  Location: [facility];  Service: Endoscopy;  Laterality: N/A;   FINE NEEDLE ASPIRATION N/A 03/23/2014   Procedure: FINE NEEDLE ASPIRATION (FNA) LINEAR;  Surgeon: Arta Silence, MD;  Location: [facility];  Service: Endoscopy;  Laterality: N/A;   PAROTIDECTOMY Right 04/02/2017   Procedure: RIGHT SUPERFICIAL PAROTIDECTOMY;  Surgeon:  Jodi Marble, MD;  Location: [facility];  Service: ENT;  Laterality: Right;   TONSILLECTOMY      SOCIAL HISTORY: Social History   Tobacco Use   Smoking status: Never Smoker   Smokeless tobacco: Never Used  Substance Use Topics   Alcohol use: No   Drug use: No    Frequency: 3.0 times per week    FAMILY HISTORY: Family History  Problem Relation Age of Onset   Healthy Mother    Hypertension Mother    Hyperlipidemia Mother    Alcoholism Mother    Anxiety disorder Mother    Drug abuse Mother    Healthy Father    Colon cancer Paternal Grandfather 65       colon   ROS: Review of Systems  Cardiovascular: Negative for chest pain.  Gastrointestinal: Negative for nausea and vomiting.  Musculoskeletal: Negative for myalgias.  Endo/Heme/Allergies:       Negative for hypoglycemia.  Psychiatric/Behavioral: Positive for depression (emotional eating). Negative for suicidal ideas. The patient does not have insomnia.        Negative for homicidal ideas.   PHYSICAL EXAM: Pt in no acute distress  RECENT LABS AND TESTS: BMET    Component Value Date/Time   NA 139 04/21/2018 1233   K 4.0 04/21/2018 1233   CL 106 04/21/2018 1233   CO2 20 04/21/2018 1233   GLUCOSE 100 (H) 04/21/2018 1233   GLUCOSE 102 (H) 03/31/2017 0945   BUN 14 04/21/2018 1233   CREATININE 0.64 04/21/2018 1233   CREATININE 0.68 02/27/2017 1601   CALCIUM 9.1 04/21/2018 1233   GFRNONAA 108 04/21/2018 1233   GFRNONAA 106 02/27/2017 1601   GFRAA 125 04/21/2018 1233   GFRAA 123 02/27/2017 1601   Lab Results  Component Value Date   HGBA1C 5.2 04/21/2018   HGBA1C 4.6 (L) 07/07/2017   HGBA1C 4.7 (L) 01/20/2017   HGBA1C 4.9 09/25/2016   HGBA1C 5.5 04/18/2016   Lab Results  Component Value Date   INSULIN 8.2 04/21/2018   INSULIN 8.1 07/07/2017   INSULIN 11.0 01/20/2017   INSULIN 11.7 09/25/2016   INSULIN 21.8 04/18/2016   CBC    Component Value Date/Time   WBC 9.8 06/13/2017  1607   RBC 4.27 06/13/2017 1607   HGB 13.2 06/13/2017 1607   HGB 13.9 03/20/2017 0930   HCT 37.7 06/13/2017 1607   HCT 42.2 03/20/2017 0930   PLT 288 06/13/2017 1607   PLT 392 (H) 04/18/2016 1201   MCV 88.3 06/13/2017  1607   MCV 94 03/20/2017 0930   MCH 30.9 06/13/2017 1607   MCHC 35.0 06/13/2017 1607   RDW 12.0 06/13/2017 1607   RDW 13.2 03/20/2017 0930   LYMPHSABS 2,264 06/13/2017 1607   LYMPHSABS 1.7 03/20/2017 0930   MONOABS 0.6 05/05/2014 0902   EOSABS 88 06/13/2017 1607   EOSABS 0.1 03/20/2017 0930   BASOSABS 29 06/13/2017 1607   BASOSABS 0.0 03/20/2017 0930   Iron/TIBC/Ferritin/ %Sat No results found for: IRON, TIBC, FERRITIN, IRONPCTSAT Lipid Panel     Component Value Date/Time   CHOL 181 04/21/2018 1233   TRIG 35 04/21/2018 1233   HDL 57 04/21/2018 1233   CHOLHDL 3.4 05/05/2014 0902   VLDL 16 05/05/2014 0902   LDLCALC 117 (H) 04/21/2018 1233   Hepatic Function Panel     Component Value Date/Time   PROT 6.8 04/21/2018 1233   ALBUMIN 4.3 04/21/2018 1233   AST 13 04/21/2018 1233   ALT 14 04/21/2018 1233   ALKPHOS 74 04/21/2018 1233   BILITOT 0.4 04/21/2018 1233      Component Value Date/Time   TSH 3.180 03/20/2017 0930   TSH 3.260 09/25/2016 0811   TSH 2.090 04/18/2016 1201   Results for Plotts, Gudrun P (MRN [id number]) as of 07/16/2018 10:21  Ref. Range 04/21/2018 12:33  Vitamin D, 25-Hydroxy Latest Ref Range: 30.0 - 100.0 ng/mL 37.7   I, Michaelene Song, am acting as Location manager for Dennard Nip, MD I have reviewed the above documentation for accuracy and completeness, and I agree with the above. -Dennard Nip, MD

## 2018-07-20 MED FILL — ATORVASTATIN 10 MG TABLET: 10 | 30 days supply | Qty: 30 | Fill #0

## 2018-07-20 MED FILL — VIT D2 1.25 MG (50,000 UNIT: 1.25 MG | 30 days supply | Qty: 10 | Fill #0

## 2018-07-20 MED FILL — SAXENDA 18 MG/3 ML PEN: 18 | 30 days supply | Qty: 15 | Fill #0

## 2018-07-20 MED FILL — BUPROPION HCL SR 150 MG TAB: 150 | 30 days supply | Qty: 60 | Fill #0

## 2018-07-21 DIAGNOSIS — F411 Generalized anxiety disorder: Secondary | ICD-10-CM | POA: Diagnosis not present

## 2018-07-28 DIAGNOSIS — F411 Generalized anxiety disorder: Secondary | ICD-10-CM | POA: Diagnosis not present

## 2018-08-03 ENCOUNTER — Other Ambulatory Visit: Payer: Self-pay | Admitting: Internal Medicine

## 2018-08-03 MED FILL — ESZOPICLONE 2 MG TAB: 2 | 30 days supply | Qty: 30 | Fill #1

## 2018-08-05 ENCOUNTER — Other Ambulatory Visit: Payer: Self-pay

## 2018-08-05 ENCOUNTER — Telehealth (INDEPENDENT_AMBULATORY_CARE_PROVIDER_SITE_OTHER): Payer: 59 | Admitting: Family Medicine

## 2018-08-05 ENCOUNTER — Encounter (INDEPENDENT_AMBULATORY_CARE_PROVIDER_SITE_OTHER): Payer: Self-pay | Admitting: Family Medicine

## 2018-08-05 DIAGNOSIS — Z6834 Body mass index (BMI) 34.0-34.9, adult: Secondary | ICD-10-CM | POA: Diagnosis not present

## 2018-08-05 DIAGNOSIS — E119 Type 2 diabetes mellitus without complications: Secondary | ICD-10-CM

## 2018-08-05 DIAGNOSIS — E669 Obesity, unspecified: Secondary | ICD-10-CM

## 2018-08-05 DIAGNOSIS — F3289 Other specified depressive episodes: Secondary | ICD-10-CM

## 2018-08-05 DIAGNOSIS — E7849 Other hyperlipidemia: Secondary | ICD-10-CM

## 2018-08-05 DIAGNOSIS — E559 Vitamin D deficiency, unspecified: Secondary | ICD-10-CM | POA: Diagnosis not present

## 2018-08-05 MED ORDER — BUPROPION HCL ER (SR) 150 MG PO TB12: 150.0000 mg | ORAL_TABLET | Freq: Two times a day (BID) | ORAL | 0 refills | Status: DC

## 2018-08-05 MED ORDER — SAXENDA 18 MG/3ML ~~LOC~~ SOPN: 3.0000 mg | PEN_INJECTOR | Freq: Every day | SUBCUTANEOUS | 0 refills | Status: DC

## 2018-08-05 MED ORDER — VITAMIN D (ERGOCALCIFEROL) 1.25 MG (50000 UNIT) PO CAPS: 50000.0000 [IU] | ORAL_CAPSULE | ORAL | 0 refills | Status: DC

## 2018-08-05 MED ORDER — BD PEN NEEDLE NANO 2ND GEN 32G X 4 MM MISC: 1.0000 | Freq: Two times a day (BID) | 0 refills | Status: DC

## 2018-08-05 MED ORDER — ATORVASTATIN CALCIUM 10 MG PO TABS: 10.0000 mg | ORAL_TABLET | Freq: Every day | ORAL | 0 refills | Status: DC

## 2018-08-06 NOTE — Progress Notes (Signed)
Office: [phone]  /  Fax: [phone] TeleHealth Visit:  Jasmine Martinez has verbally consented to this TeleHealth visit today. The patient is located at home, the provider is located at the [employer] [facility] office. The participants in this visit include the listed provider and patient. The visit was conducted today via telephone call (FaceTime failed - changed to telephone call).  HPI:   Chief Complaint: OBESITY Jasmine Martinez is here to discuss her progress with her obesity treatment plan. She is on the  follow a lower carbohydrate, vegetable and lean protein rich diet plan. She states she is exercising 0 minutes 0 times per week. Jasmine Martinez states she is doing well maintaining her weight on her lower carb plan. She is getting ready to move and is working on packing. She has eaten out more but is still mindful of her food choices. We were unable to weigh the patient today for this TeleHealth visit. She feels as if she has maintained her weight since her last visit. She has lost 55 lbs since starting treatment with [place].  Hyperlipidemia Jasmine Martinez has hyperlipidemia and has been trying to improve her cholesterol levels with intensive lifestyle modification including a low saturated fat diet, exercise and weight loss. Her last LDL level was 117. Jasmine Martinez is working on diet and is tolerating Lipitor well. She denies any chest pain or myalgias.  Diabetes II Jasmine Martinez has a diagnosis of diabetes type II and is on liraglutide. She is doing well with diet and medications. Jasmine Martinez states CBG's have been ranging between 90 and 100 in the mornings. Last A1c was well controlled at 5.2 on 04/21/2018. She has been working on intensive lifestyle modifications including diet, exercise, and weight loss to help control her blood glucose levels. Jasmine Martinez notes decreased polyphagia on liraglutide and denies hypoglycemia.  Depression with emotional eating behaviors Jasmine Martinez is struggling with emotional  eating and using food for comfort to the extent that it is negatively impacting her health. She often snacks when she is not hungry. Jasmine Martinez sometimes feels she is out of control and then feels guilty that she made poor food choices. She has been working on behavior modification techniques to help reduce her emotional eating and has been somewhat successful. Jasmine Martinez's mood is stable on Wellbutrin. She denies insomnia or increased blood pressure. She shows no sign of suicidal or homicidal ideations.  Depression screen Jasmine Martinez 2/9 04/23/2016 01/02/2016 02/14/2015 06/23/2014  Decreased Interest 0 1 0 1  Down, Depressed, Hopeless 0 1 0 1  PHQ - 2 Score 0 2 0 2  Altered sleeping - 2 - 1  Tired, decreased energy - 3 - 1  Change in appetite - 3 - 0  Feeling bad or failure about yourself  - 3 - 0  Trouble concentrating - 0 - 0  Moving slowly or fidgety/restless - 0 - 0  Suicidal thoughts - 0 - 0  PHQ-9 Score - 13 - 4  Difficult doing work/chores - - - Not difficult at all   Vitamin D deficiency Jasmine Martinez has a diagnosis of Vitamin D deficiency. She is currently stable on prescription Vit D and denies nausea, vomiting or muscle weakness.  ASSESSMENT AND PLAN:  Type 2 diabetes mellitus without complication, without long-term current use of insulin (HCC)  Other depression - with emotional eating - Plan: buPROPion (WELLBUTRIN SR) 150 MG 12 hr tablet  Vitamin D deficiency - Plan: Vitamin D, Ergocalciferol, (DRISDOL) 1.25 MG (50000 UT) CAPS capsule  Other hyperlipidemia - Plan: atorvastatin (LIPITOR)  10 MG tablet  Class 1 obesity with serious comorbidity and body mass index (BMI) of 34.0 to 34.9 in adult, unspecified obesity type - Plan: Liraglutide -Weight Management (SAXENDA) 18 MG/3ML SOPN, Insulin Pen Needle (BD PEN NEEDLE NANO 2ND GEN) 32G X 4 MM MISC  PLAN:  Hyperlipidemia Jasmine Martinez was informed of the American Heart Association Guidelines emphasizing intensive lifestyle modifications as the first  line treatment for hyperlipidemia. We discussed many lifestyle modifications today in depth, and Jasmine Martinez will continue to work on decreasing saturated fats such as fatty red meat, butter and many fried foods. Jasmine Martinez was given a refill on her Lipitor 10 mg #30 with 0 refills and she agrees to follow-up with our clinic in 3 weeks.  She will also increase vegetables and lean protein in her diet and continue to work on exercise and weight loss efforts.  Diabetes II Jasmine Martinez has been given extensive diabetes education by myself today including ideal fasting and post-prandial blood glucose readings, individual ideal HgA1c goals  and hypoglycemia prevention. We discussed the importance of good blood sugar control to decrease the likelihood of diabetic complications such as nephropathy, neuropathy, limb loss, blindness, coronary artery disease, and death. We discussed the importance of intensive lifestyle modification including diet, exercise and weight loss as the first line treatment for diabetes. Jasmine Martinez agrees to continue liraglutide, diet, and will follow-up at the agreed upon time.  Depression with Emotional Eating Behaviors We discussed behavior modification techniques today to help Jasmine Martinez deal with her emotional eating and depression. Jasmine Martinez was given a refill on her Wellbutrin 150 mg #60 with 0 refills and agrees to follow-up with our clinic in 3 weeks.  Vitamin D Deficiency Jasmine Martinez was informed that low Vitamin D levels contributes to fatigue and are associated with obesity, breast, and colon cancer. She agrees to continue to take prescription Vit D @ 50,000 IU every three days #10 with 0 refills and will follow-up for routine testing of Vitamin D, at least 2-3 times per year. She was informed of the risk of over-replacement of Vitamin D and agrees to not increase her dose unless she discusses this with [place] first. Jasmine Martinez agrees to follow-up with our clinic in 3 weeks.  Obesity Jasmine Martinez is  currently in the action stage of change. As such, her goal is to continue with weight loss efforts. She has agreed to follow a lower carbohydrate, vegetable and lean protein rich diet plan. Jasmine Martinez has been instructed to work up to a goal of 150 minutes of combined cardio and strengthening exercise per week for weight loss and overall health benefits. We discussed the following Behavioral Modification Strategies today: work on meal planning and easy cooking plans, and better snacking choices. Jasmine Martinez was given a refill on her Saxenda and needles. She agrees to follow-up with our clinic in 3 weeks.  Jasmine Martinez has agreed to follow-up with our clinic in 3 weeks. She was informed of the importance of frequent follow-up visits to maximize her success with intensive lifestyle modifications for her multiple health conditions.  ALLERGIES: Allergies  Allergen Reactions  . Codeine Shortness Of Breath  . Hydrocodone Shortness Of Breath    Mild SOB    MEDICATIONS: Current Outpatient Medications on File Prior to Visit  Medication Sig Dispense Refill  . Accu-Chek FastClix Lancets MISC 1 each by Does not apply route daily. 100 each 0  . albuterol (PROVENTIL HFA;VENTOLIN HFA) 108 (90 Base) MCG/ACT inhaler Inhale 2 puffs into the lungs every 6 (six) hours as needed for  wheezing or shortness of breath. 1 Inhaler 2  . ALPRAZolam (XANAX) 0.5 MG tablet One or 2 tablets po 3 times daily as needed for anxiety 180 tablet 0  . eszopiclone (LUNESTA) 2 MG TABS tablet TAKE 1 TABLET (2 MG TOTAL) BY MOUTH AT BEDTIME AS NEEDED FOR SLEEP. TAKE IMMEDIATELY BEFORE BEDTIME 30 tablet 3  . glucose blood (ACCU-CHEK GUIDE) test strip Use as instructed 100 each 0  . levonorgestrel (MIRENA) 20 MCG/24HR IUD 1 each by Intrauterine route once.    . Melatonin 3 MG TABS Take by mouth.     No current facility-administered medications on file prior to visit.     PAST MEDICAL HISTORY: Past Medical History:  Diagnosis Date  .  Anxiety   . Back pain   . Chest pain   . Depression   . Diabetes mellitus   . Diarrhea   . Edema    feet and legs  . Gallbladder problem   . 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 Heartburn   . HTN (hypertension)    no meds now  . Hyperlipidemia   . Obesity   . Palpitations   . Parotid mass    right  . PMDD (premenstrual dysphoric disorder) 01/02/2016  . Shortness of breath   . Vitamin D deficiency   . Vitamin D deficiency     PAST SURGICAL HISTORY: Past Surgical History:  Procedure Laterality Date  . APPENDECTOMY    . CHOLECYSTECTOMY    . EUS N/A 03/23/2014   Procedure: ESOPHAGEAL ENDOSCOPIC ULTRASOUND (EUS) RADIAL;  Surgeon: Arta Silence, MD;  Location: [facility];  Service: Endoscopy;  Laterality: N/A;  . FINE NEEDLE ASPIRATION N/A 03/23/2014   Procedure: FINE NEEDLE ASPIRATION (FNA) LINEAR;  Surgeon: Arta Silence, MD;  Location: [facility];  Service: Endoscopy;  Laterality: N/A;  . PAROTIDECTOMY Right 04/02/2017   Procedure: RIGHT SUPERFICIAL PAROTIDECTOMY;  Surgeon: Jasmine Marble, MD;  Location: [facility];  Service: ENT;  Laterality: Right;  . TONSILLECTOMY      SOCIAL HISTORY: Social History   Tobacco Use  . Smoking status: Never Smoker  . Smokeless tobacco: Never Used  Substance Use Topics  . Alcohol use: No  . Drug use: No    Frequency: 3.0 times per week    FAMILY HISTORY: Family History  Problem Relation Age of Onset  . Healthy Mother   . Hypertension Mother   . Hyperlipidemia Mother   . Alcoholism Mother   . Anxiety disorder Mother   . Drug abuse Mother   . Healthy Father   . Colon cancer Paternal Grandfather 74       colon   ROS: Review of Systems  Cardiovascular: Negative for chest pain.  Gastrointestinal: Negative for nausea and vomiting.  Musculoskeletal: Negative for myalgias.       Negative for muscle weakness.  Endo/Heme/Allergies:       Positive for decreased polyphagia on  liraglutide. Negative for hypoglycemia.  Psychiatric/Behavioral: Positive for depression (emotional eating). Negative for suicidal ideas.       Negative for homicidal ideas.   PHYSICAL EXAM: Pt in no acute distress  RECENT LABS AND TESTS: BMET    Component Value Date/Time   NA 139 04/21/2018 1233   K 4.0 04/21/2018 1233   CL 106 04/21/2018 1233   CO2 20 04/21/2018 1233   GLUCOSE 100 (H) 04/21/2018 1233   GLUCOSE 102 (H) 03/31/2017 0945   BUN 14 04/21/2018 1233   CREATININE  0.64 04/21/2018 1233   CREATININE 0.68 02/27/2017 1601   CALCIUM 9.1 04/21/2018 1233   GFRNONAA 108 04/21/2018 1233   GFRNONAA 106 02/27/2017 1601   GFRAA 125 04/21/2018 1233   GFRAA 123 02/27/2017 1601   Lab Results  Component Value Date   HGBA1C 5.2 04/21/2018   HGBA1C 4.6 (L) 07/07/2017   HGBA1C 4.7 (L) 01/20/2017   HGBA1C 4.9 09/25/2016   HGBA1C 5.5 04/18/2016   Lab Results  Component Value Date   INSULIN 8.2 04/21/2018   INSULIN 8.1 07/07/2017   INSULIN 11.0 01/20/2017   INSULIN 11.7 09/25/2016   INSULIN 21.8 04/18/2016   CBC    Component Value Date/Time   WBC 9.8 06/13/2017 1607   RBC 4.27 06/13/2017 1607   HGB 13.2 06/13/2017 1607   HGB 13.9 03/20/2017 0930   HCT 37.7 06/13/2017 1607   HCT 42.2 03/20/2017 0930   PLT 288 06/13/2017 1607   PLT 392 (H) 04/18/2016 1201   MCV 88.3 06/13/2017 1607   MCV 94 03/20/2017 0930   MCH 30.9 06/13/2017 1607   MCHC 35.0 06/13/2017 1607   RDW 12.0 06/13/2017 1607   RDW 13.2 03/20/2017 0930   LYMPHSABS 2,264 06/13/2017 1607   LYMPHSABS 1.7 03/20/2017 0930   MONOABS 0.6 05/05/2014 0902   EOSABS 88 06/13/2017 1607   EOSABS 0.1 03/20/2017 0930   BASOSABS 29 06/13/2017 1607   BASOSABS 0.0 03/20/2017 0930   Iron/TIBC/Ferritin/ %Sat No results found for: IRON, TIBC, FERRITIN, IRONPCTSAT Lipid Panel     Component Value Date/Time   CHOL 181 04/21/2018 1233   TRIG 35 04/21/2018 1233   HDL 57 04/21/2018 1233   CHOLHDL 3.4 05/05/2014 0902    VLDL 16 05/05/2014 0902   LDLCALC 117 (H) 04/21/2018 1233   Hepatic Function Panel     Component Value Date/Time   PROT 6.8 04/21/2018 1233   ALBUMIN 4.3 04/21/2018 1233   AST 13 04/21/2018 1233   ALT 14 04/21/2018 1233   ALKPHOS 74 04/21/2018 1233   BILITOT 0.4 04/21/2018 1233      Component Value Date/Time   TSH 3.180 03/20/2017 0930   TSH 3.260 09/25/2016 0811   TSH 2.090 04/18/2016 1201   Results for Stonehouse, Nainika P (MRN [id number]) as of 08/06/2018 11:20  Ref. Range 04/21/2018 12:33  Vitamin D, 25-Hydroxy Latest Ref Range: 30.0 - 100.0 ng/mL 37.7   I, Michaelene Song, am acting as Location manager for Dennard Nip, MD I have reviewed the above documentation for accuracy and completeness, and I agree with the above. -Dennard Nip, MD

## 2018-08-10 ENCOUNTER — Ambulatory Visit: Payer: 59 | Admitting: Internal Medicine

## 2018-08-11 DIAGNOSIS — F411 Generalized anxiety disorder: Secondary | ICD-10-CM | POA: Diagnosis not present

## 2018-08-18 DIAGNOSIS — F411 Generalized anxiety disorder: Secondary | ICD-10-CM | POA: Diagnosis not present

## 2018-08-24 DIAGNOSIS — Z1231 Encounter for screening mammogram for malignant neoplasm of breast: Secondary | ICD-10-CM | POA: Diagnosis not present

## 2018-08-24 DIAGNOSIS — Z01419 Encounter for gynecological examination (general) (routine) without abnormal findings: Secondary | ICD-10-CM | POA: Diagnosis not present

## 2018-08-25 DIAGNOSIS — F411 Generalized anxiety disorder: Secondary | ICD-10-CM | POA: Diagnosis not present

## 2018-08-26 ENCOUNTER — Encounter (INDEPENDENT_AMBULATORY_CARE_PROVIDER_SITE_OTHER): Payer: Self-pay | Admitting: Family Medicine

## 2018-08-26 ENCOUNTER — Telehealth (INDEPENDENT_AMBULATORY_CARE_PROVIDER_SITE_OTHER): Payer: 59 | Admitting: Family Medicine

## 2018-08-26 ENCOUNTER — Other Ambulatory Visit: Payer: Self-pay

## 2018-08-26 DIAGNOSIS — F3289 Other specified depressive episodes: Secondary | ICD-10-CM | POA: Diagnosis not present

## 2018-08-26 DIAGNOSIS — E7849 Other hyperlipidemia: Secondary | ICD-10-CM

## 2018-08-26 DIAGNOSIS — E669 Obesity, unspecified: Secondary | ICD-10-CM

## 2018-08-26 DIAGNOSIS — E119 Type 2 diabetes mellitus without complications: Secondary | ICD-10-CM | POA: Diagnosis not present

## 2018-08-26 DIAGNOSIS — E559 Vitamin D deficiency, unspecified: Secondary | ICD-10-CM | POA: Diagnosis not present

## 2018-08-26 DIAGNOSIS — Z6834 Body mass index (BMI) 34.0-34.9, adult: Secondary | ICD-10-CM | POA: Diagnosis not present

## 2018-08-26 MED ORDER — BD PEN NEEDLE NANO 2ND GEN 32G X 4 MM MISC: 1.0000 | Freq: Two times a day (BID) | 0 refills | Status: DC

## 2018-08-26 MED ORDER — BUPROPION HCL ER (SR) 150 MG PO TB12: 150.0000 mg | ORAL_TABLET | Freq: Two times a day (BID) | ORAL | 0 refills | Status: DC

## 2018-08-26 MED ORDER — VITAMIN D (ERGOCALCIFEROL) 1.25 MG (50000 UNIT) PO CAPS: 50000.0000 [IU] | ORAL_CAPSULE | ORAL | 0 refills | Status: DC

## 2018-08-26 MED ORDER — ATORVASTATIN CALCIUM 10 MG PO TABS: 10.0000 mg | ORAL_TABLET | Freq: Every day | ORAL | 0 refills | Status: DC

## 2018-08-26 MED ORDER — ACCU-CHEK FASTCLIX LANCETS MISC: 1.0000 | Freq: Every day | 0 refills | Status: DC

## 2018-08-26 MED ORDER — SAXENDA 18 MG/3ML ~~LOC~~ SOPN: 3.0000 mg | PEN_INJECTOR | Freq: Every day | SUBCUTANEOUS | 0 refills | Status: DC

## 2018-08-26 MED ORDER — ACCU-CHEK GUIDE VI STRP: ORAL_STRIP | 0 refills | Status: DC

## 2018-08-26 MED FILL — VIT D2 1.25 MG (50,000 UNIT: 1.25 MG | 30 days supply | Qty: 10 | Fill #0

## 2018-08-26 MED FILL — ACCU-CHEK GUIDE TEST STRIP: 90 days supply | Qty: 100 | Fill #0

## 2018-08-26 MED FILL — SAXENDA 18 MG/3 ML PEN: 18 | 30 days supply | Qty: 15 | Fill #0

## 2018-08-26 MED FILL — ATORVASTATIN 10 MG TABLET: 10 | 30 days supply | Qty: 30 | Fill #0

## 2018-08-26 MED FILL — BUPROPION HCL SR 150 MG TAB: 150 | 30 days supply | Qty: 60 | Fill #0

## 2018-08-26 MED FILL — BD PEN NDL NANO 32GX5/32: 32G X 4 MM | 50 days supply | Qty: 100 | Fill #0

## 2018-08-31 NOTE — Progress Notes (Signed)
Office: [phone]  /  Fax: [phone] TeleHealth Visit:  Jasmine Martinez has verbally consented to this TeleHealth visit today. The patient is located at home, the provider is located at the [employer] [facility] office. The participants in this visit include the listed provider and patient. The visit was conducted today via telephone call (FaceTime failed - changed to telephone call).  HPI:   Chief Complaint: OBESITY Jasmine Martinez is here to discuss her progress with her obesity treatment plan. She is on the lower carbohydrate, vegetable and lean protein rich diet plan and is following her eating plan approximately 75% of the time. She states she is exercising 0 minutes 0 times per week. Jasmine Martinez has done well maintaining her weight. She recently bought a new home but is having unexpected renovations and can't move in yet. She has packed up her previous house so she is having to eat out more, but is still mindful of her food choices. We were unable to weigh the patient today for this TeleHealth visit. She feels as if she has maintained her weight since her last visit. She has lost 55 lbs since starting treatment with [place].  Diabetes II Jasmine Martinez has a diagnosis of diabetes type II, which is well controlled. Jasmine Martinez states fasting blood sugars range between 90 and 95. Last A1c was 5.2 on 04/21/2018. She has been working on intensive lifestyle modifications including diet, exercise, and weight loss to help control her blood glucose levels.  Vitamin D deficiency Jasmine Martinez has a diagnosis of Vitamin D deficiency, which is stable but not yet at goal. Last Vitamin D was 37.7 on 04/21/2018. She is currently taking prescription Vit D and denies nausea, vomiting or muscle weakness.  Hyperlipidemia, Pure Jasmine Martinez has hyperlipidemia and is stable on Lipitor. She has been trying to improve her cholesterol levels with intensive lifestyle modification including a low saturated fat diet, exercise and  weight loss. LDL is still not at goal (117). She denies any chest pain or myalgias.  Depression  Jasmine Martinez's mood is stable on Wellbutrin. She has had extra stress but seems to be dealing with it well.  Depression screen [facility] 2/9 04/23/2016 01/02/2016 02/14/2015 06/23/2014  Decreased Interest 0 1 0 1  Down, Depressed, Hopeless 0 1 0 1  PHQ - 2 Score 0 2 0 2  Altered sleeping - 2 - 1  Tired, decreased energy - 3 - 1  Change in appetite - 3 - 0  Feeling bad or failure about yourself  - 3 - 0  Trouble concentrating - 0 - 0  Moving slowly or fidgety/restless - 0 - 0  Suicidal thoughts - 0 - 0  PHQ-9 Score - 13 - 4  Difficult doing work/chores - - - Not difficult at all   ASSESSMENT AND PLAN:  Other hyperlipidemia - Plan: atorvastatin (LIPITOR) 10 MG tablet  Type 2 diabetes mellitus without complication, without long-term current use of insulin (HCC) - Plan: glucose blood (ACCU-CHEK GUIDE) test strip  Other depression - with emotional eating - Plan: buPROPion (WELLBUTRIN SR) 150 MG 12 hr tablet  Class 1 obesity with serious comorbidity and body mass index (BMI) of 34.0 to 34.9 in adult, unspecified obesity type - Plan: Insulin Pen Needle (BD PEN NEEDLE NANO 2ND GEN) 32G X 4 MM MISC, Liraglutide -Weight Management (SAXENDA) 18 MG/3ML SOPN  Vitamin D deficiency - Plan: Vitamin D, Ergocalciferol, (DRISDOL) 1.25 MG (50000 UT) CAPS capsule  PLAN:  Diabetes II Jasmine Martinez has been given extensive diabetes education by  myself today including ideal fasting and post-prandial blood glucose readings, individual ideal HgA1c goals  and hypoglycemia prevention. We discussed the importance of good blood sugar control to decrease the likelihood of diabetic complications such as nephropathy, neuropathy, limb loss, blindness, coronary artery disease, and death. We discussed the importance of intensive lifestyle modification including diet, exercise and weight loss as the first line treatment for diabetes. Jasmine Martinez  was given refills on needles, lancets, strips, and Saxenda and agrees to follow-up with our clinic in 3 weeks. She will continue diet and will have labs checked in 1 months.  Vitamin D Deficiency Jasmine Martinez was informed that low Vitamin D levels contributes to fatigue and are associated with obesity, breast, and colon cancer. She agrees to continue to take prescription Vit D @ 50,000 IU every three days #10 with 0 refills and will follow-up for routine testing of Vitamin D in 1 month. She was informed of the risk of over-replacement of Vitamin D and agrees to not increase her dose unless she discusses this with [place] first. Jasmine Martinez agrees to follow-up with our clinic in 3 weeks.  Hyperlipidemia Jasmine Martinez was informed of the American Heart Association Guidelines emphasizing intensive lifestyle modifications as the first line treatment for hyperlipidemia. We discussed many lifestyle modifications today in depth, and Jasmine Martinez will continue to work on decreasing saturated fats such as fatty red meat, butter and many fried foods. Jasmine Martinez was given a refill on her Lipitor 10 mg #30 with 0 refills and agrees to follow-up with our clinic in 3 weeks. She will also increase vegetables and lean protein in her diet and continue to work on exercise and weight loss efforts.  Depression with Emotional Eating Behaviors We discussed behavior modification techniques today to help Jasmine Martinez deal with her emotional eating and depression. Jasmine Martinez was given a refill on her Wellbutrin 150 mg #60 with 0 refills and agrees to follow-up with our clinic in 3 weeks. Emotional eating strategies were discussed.  I spent > than 50% of the 25 minute visit on counseling as documented in the note.  Obesity Jasmine Martinez is currently in the action stage of change. As such, her goal is to continue with weight loss efforts. She has agreed to follow a lower carbohydrate, vegetable and lean protein rich diet plan. Jasmine Martinez has been instructed to  work up to a goal of 150 minutes of combined cardio and strengthening exercise per week for weight loss and overall health benefits. We discussed the following Behavioral Modification Strategies today: work on meal planning and easy cooking plans, and emotional eating strategies.  Jasmine Martinez has agreed to follow-up with our clinic in 3 weeks. She was informed of the importance of frequent follow-up visits to maximize her success with intensive lifestyle modifications for her multiple health conditions.  ALLERGIES: Allergies  Allergen Reactions  . Codeine Shortness Of Breath  . Hydrocodone Shortness Of Breath    Mild SOB    MEDICATIONS: Current Outpatient Medications on File Prior to Visit  Medication Sig Dispense Refill  . albuterol (PROVENTIL HFA;VENTOLIN HFA) 108 (90 Base) MCG/ACT inhaler Inhale 2 puffs into the lungs every 6 (six) hours as needed for wheezing or shortness of breath. 1 Inhaler 2  . ALPRAZolam (XANAX) 0.5 MG tablet One or 2 tablets po 3 times daily as needed for anxiety 180 tablet 0  . eszopiclone (LUNESTA) 2 MG TABS tablet TAKE 1 TABLET (2 MG TOTAL) BY MOUTH AT BEDTIME AS NEEDED FOR SLEEP. TAKE IMMEDIATELY BEFORE BEDTIME 30 tablet 3  .  levonorgestrel (MIRENA) 20 MCG/24HR IUD 1 each by Intrauterine route once.    . Melatonin 3 MG TABS Take by mouth.     No current facility-administered medications on file prior to visit.     PAST MEDICAL HISTORY: Past Medical History:  Diagnosis Date  . Anxiety   . Back pain   . Chest pain   . Depression   . Diabetes mellitus   . Diarrhea   . Edema    feet and legs  . Gallbladder problem   . 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 Heartburn   . HTN (hypertension)    no meds now  . Hyperlipidemia   . Obesity   . Palpitations   . Parotid mass    right  . PMDD (premenstrual dysphoric disorder) 01/02/2016  . Shortness of breath   . Vitamin D deficiency   . Vitamin D deficiency     PAST SURGICAL  HISTORY: Past Surgical History:  Procedure Laterality Date  . APPENDECTOMY    . CHOLECYSTECTOMY    . EUS N/A 03/23/2014   Procedure: ESOPHAGEAL ENDOSCOPIC ULTRASOUND (EUS) RADIAL;  Surgeon: Arta Silence, MD;  Location: [facility];  Service: Endoscopy;  Laterality: N/A;  . FINE NEEDLE ASPIRATION N/A 03/23/2014   Procedure: FINE NEEDLE ASPIRATION (FNA) LINEAR;  Surgeon: Arta Silence, MD;  Location: [facility];  Service: Endoscopy;  Laterality: N/A;  . PAROTIDECTOMY Right 04/02/2017   Procedure: RIGHT SUPERFICIAL PAROTIDECTOMY;  Surgeon: Jodi Marble, MD;  Location: [place];  Service: ENT;  Laterality: Right;  . TONSILLECTOMY      SOCIAL HISTORY: Social History   Tobacco Use  . Smoking status: Never Smoker  . Smokeless tobacco: Never Used  Substance Use Topics  . Alcohol use: No  . Drug use: No    Frequency: 3.0 times per week    FAMILY HISTORY: Family History  Problem Relation Age of Onset  . Healthy Mother   . Hypertension Mother   . Hyperlipidemia Mother   . Alcoholism Mother   . Anxiety disorder Mother   . Drug abuse Mother   . Healthy Father   . Colon cancer Paternal Grandfather 23       colon   ROS: Review of Systems  Cardiovascular: Negative for chest pain.  Gastrointestinal: Negative for nausea and vomiting.  Musculoskeletal: Negative for myalgias.       Negative for muscle weakness.  Endo/Heme/Allergies:       Negative for hypoglycemia.  Psychiatric/Behavioral: Positive for depression.   PHYSICAL EXAM: Pt in no acute distress  RECENT LABS AND TESTS: BMET    Component Value Date/Time   NA 139 04/21/2018 1233   K 4.0 04/21/2018 1233   CL 106 04/21/2018 1233   CO2 20 04/21/2018 1233   GLUCOSE 100 (H) 04/21/2018 1233   GLUCOSE 102 (H) 03/31/2017 0945   BUN 14 04/21/2018 1233   CREATININE 0.64 04/21/2018 1233   CREATININE 0.68 02/27/2017 1601   CALCIUM 9.1 04/21/2018 1233   GFRNONAA 108 04/21/2018 1233   GFRNONAA 106  02/27/2017 1601   GFRAA 125 04/21/2018 1233   GFRAA 123 02/27/2017 1601   Lab Results  Component Value Date   HGBA1C 5.2 04/21/2018   HGBA1C 4.6 (L) 07/07/2017   HGBA1C 4.7 (L) 01/20/2017   HGBA1C 4.9 09/25/2016   HGBA1C 5.5 04/18/2016   Lab Results  Component Value Date   INSULIN 8.2 04/21/2018   INSULIN 8.1 07/07/2017   INSULIN 11.0 01/20/2017  INSULIN 11.7 09/25/2016   INSULIN 21.8 04/18/2016   CBC    Component Value Date/Time   WBC 9.8 06/13/2017 1607   RBC 4.27 06/13/2017 1607   HGB 13.2 06/13/2017 1607   HGB 13.9 03/20/2017 0930   HCT 37.7 06/13/2017 1607   HCT 42.2 03/20/2017 0930   PLT 288 06/13/2017 1607   PLT 392 (H) 04/18/2016 1201   MCV 88.3 06/13/2017 1607   MCV 94 03/20/2017 0930   MCH 30.9 06/13/2017 1607   MCHC 35.0 06/13/2017 1607   RDW 12.0 06/13/2017 1607   RDW 13.2 03/20/2017 0930   LYMPHSABS 2,264 06/13/2017 1607   LYMPHSABS 1.7 03/20/2017 0930   MONOABS 0.6 05/05/2014 0902   EOSABS 88 06/13/2017 1607   EOSABS 0.1 03/20/2017 0930   BASOSABS 29 06/13/2017 1607   BASOSABS 0.0 03/20/2017 0930   Iron/TIBC/Ferritin/ %Sat No results found for: IRON, TIBC, FERRITIN, IRONPCTSAT Lipid Panel     Component Value Date/Time   CHOL 181 04/21/2018 1233   TRIG 35 04/21/2018 1233   HDL 57 04/21/2018 1233   CHOLHDL 3.4 05/05/2014 0902   VLDL 16 05/05/2014 0902   LDLCALC 117 (H) 04/21/2018 1233   Hepatic Function Panel     Component Value Date/Time   PROT 6.8 04/21/2018 1233   ALBUMIN 4.3 04/21/2018 1233   AST 13 04/21/2018 1233   ALT 14 04/21/2018 1233   ALKPHOS 74 04/21/2018 1233   BILITOT 0.4 04/21/2018 1233      Component Value Date/Time   TSH 3.180 03/20/2017 0930   TSH 3.260 09/25/2016 0811   TSH 2.090 04/18/2016 1201   Results for Dozier, Brandice P (MRN [id number]) as of 08/31/2018 13:26  Ref. Range 04/21/2018 12:33  Vitamin D, 25-Hydroxy Latest Ref Range: 30.0 - 100.0 ng/mL 37.7   I, Michaelene Song, am acting as Location manager for  Dennard Nip, MD I have reviewed the above documentation for accuracy and completeness, and I agree with the above. -Dennard Nip, MD

## 2018-09-02 DIAGNOSIS — C49A2 Gastrointestinal stromal tumor of stomach: Secondary | ICD-10-CM | POA: Diagnosis not present

## 2018-09-02 MED FILL — SAXENDA 18 MG/3 ML PEN: 18 | 30 days supply | Qty: 15 | Fill #0

## 2018-09-02 MED FILL — BUPROPION HCL SR 150 MG TAB: 150 | 30 days supply | Qty: 60 | Fill #0

## 2018-09-02 MED FILL — UNIFINE PENTIPS 32GX5/32": 32G X 4 MM | 50 days supply | Qty: 100 | Fill #0

## 2018-09-02 MED FILL — ACCU-CHEK GUIDE TEST STRIP: 30 days supply | Qty: 100 | Fill #0

## 2018-09-02 MED FILL — ATORVASTATIN 10 MG TABLET: 10 | 30 days supply | Qty: 30 | Fill #0

## 2018-09-02 MED FILL — UNIFINE PENTIPS 32GX5/32: 32G X 4 MM | 50 days supply | Qty: 100 | Fill #0

## 2018-09-02 MED FILL — VIT D2 1.25 MG (50,000 UNIT: 1.25 MG | 30 days supply | Qty: 10 | Fill #0

## 2018-09-03 DIAGNOSIS — F411 Generalized anxiety disorder: Secondary | ICD-10-CM | POA: Diagnosis not present

## 2018-09-04 DIAGNOSIS — M7501 Adhesive capsulitis of right shoulder: Secondary | ICD-10-CM | POA: Diagnosis not present

## 2018-09-04 DIAGNOSIS — M25511 Pain in right shoulder: Secondary | ICD-10-CM | POA: Diagnosis not present

## 2018-09-13 ENCOUNTER — Telehealth: Payer: 59 | Admitting: Nurse Practitioner

## 2018-09-13 DIAGNOSIS — N3000 Acute cystitis without hematuria: Secondary | ICD-10-CM | POA: Diagnosis not present

## 2018-09-13 MED ORDER — CEPHALEXIN 500 MG PO CAPS: 500.0000 mg | ORAL_CAPSULE | Freq: Two times a day (BID) | ORAL | 0 refills | Status: DC

## 2018-09-13 NOTE — Progress Notes (Signed)

## 2018-09-15 DIAGNOSIS — F411 Generalized anxiety disorder: Secondary | ICD-10-CM | POA: Diagnosis not present

## 2018-09-17 DIAGNOSIS — M25511 Pain in right shoulder: Secondary | ICD-10-CM | POA: Diagnosis not present

## 2018-09-17 DIAGNOSIS — M25611 Stiffness of right shoulder, not elsewhere classified: Secondary | ICD-10-CM | POA: Diagnosis not present

## 2018-09-17 DIAGNOSIS — M7501 Adhesive capsulitis of right shoulder: Secondary | ICD-10-CM | POA: Diagnosis not present

## 2018-09-17 DIAGNOSIS — S46191A Other injury of muscle, fascia and tendon of long head of biceps, right arm, initial encounter: Secondary | ICD-10-CM | POA: Diagnosis not present

## 2018-09-17 DIAGNOSIS — R531 Weakness: Secondary | ICD-10-CM | POA: Diagnosis not present

## 2018-09-18 ENCOUNTER — Encounter (INDEPENDENT_AMBULATORY_CARE_PROVIDER_SITE_OTHER): Payer: Self-pay | Admitting: Family Medicine

## 2018-09-22 DIAGNOSIS — M25611 Stiffness of right shoulder, not elsewhere classified: Secondary | ICD-10-CM | POA: Diagnosis not present

## 2018-09-22 DIAGNOSIS — S46191A Other injury of muscle, fascia and tendon of long head of biceps, right arm, initial encounter: Secondary | ICD-10-CM | POA: Diagnosis not present

## 2018-09-22 DIAGNOSIS — M25511 Pain in right shoulder: Secondary | ICD-10-CM | POA: Diagnosis not present

## 2018-09-22 DIAGNOSIS — R531 Weakness: Secondary | ICD-10-CM | POA: Diagnosis not present

## 2018-09-22 DIAGNOSIS — M7501 Adhesive capsulitis of right shoulder: Secondary | ICD-10-CM | POA: Diagnosis not present

## 2018-09-22 NOTE — Telephone Encounter (Signed)
Please advise 

## 2018-09-23 ENCOUNTER — Telehealth (INDEPENDENT_AMBULATORY_CARE_PROVIDER_SITE_OTHER): Payer: 59 | Admitting: Family Medicine

## 2018-09-24 DIAGNOSIS — M25611 Stiffness of right shoulder, not elsewhere classified: Secondary | ICD-10-CM | POA: Diagnosis not present

## 2018-09-24 DIAGNOSIS — M25511 Pain in right shoulder: Secondary | ICD-10-CM | POA: Diagnosis not present

## 2018-09-24 DIAGNOSIS — M7501 Adhesive capsulitis of right shoulder: Secondary | ICD-10-CM | POA: Diagnosis not present

## 2018-09-24 DIAGNOSIS — R531 Weakness: Secondary | ICD-10-CM | POA: Diagnosis not present

## 2018-09-24 DIAGNOSIS — S46191A Other injury of muscle, fascia and tendon of long head of biceps, right arm, initial encounter: Secondary | ICD-10-CM | POA: Diagnosis not present

## 2018-09-29 DIAGNOSIS — R531 Weakness: Secondary | ICD-10-CM | POA: Diagnosis not present

## 2018-09-29 DIAGNOSIS — M25511 Pain in right shoulder: Secondary | ICD-10-CM | POA: Diagnosis not present

## 2018-09-29 DIAGNOSIS — M25611 Stiffness of right shoulder, not elsewhere classified: Secondary | ICD-10-CM | POA: Diagnosis not present

## 2018-09-29 DIAGNOSIS — Z30431 Encounter for routine checking of intrauterine contraceptive device: Secondary | ICD-10-CM | POA: Diagnosis not present

## 2018-09-29 DIAGNOSIS — M7501 Adhesive capsulitis of right shoulder: Secondary | ICD-10-CM | POA: Diagnosis not present

## 2018-09-29 DIAGNOSIS — S46191A Other injury of muscle, fascia and tendon of long head of biceps, right arm, initial encounter: Secondary | ICD-10-CM | POA: Diagnosis not present

## 2018-09-30 DIAGNOSIS — F411 Generalized anxiety disorder: Secondary | ICD-10-CM | POA: Diagnosis not present

## 2018-10-01 DIAGNOSIS — R531 Weakness: Secondary | ICD-10-CM | POA: Diagnosis not present

## 2018-10-01 DIAGNOSIS — M7501 Adhesive capsulitis of right shoulder: Secondary | ICD-10-CM | POA: Diagnosis not present

## 2018-10-01 DIAGNOSIS — M25611 Stiffness of right shoulder, not elsewhere classified: Secondary | ICD-10-CM | POA: Diagnosis not present

## 2018-10-01 DIAGNOSIS — M25511 Pain in right shoulder: Secondary | ICD-10-CM | POA: Diagnosis not present

## 2018-10-01 DIAGNOSIS — S46191A Other injury of muscle, fascia and tendon of long head of biceps, right arm, initial encounter: Secondary | ICD-10-CM | POA: Diagnosis not present

## 2018-10-06 DIAGNOSIS — M25611 Stiffness of right shoulder, not elsewhere classified: Secondary | ICD-10-CM | POA: Diagnosis not present

## 2018-10-06 DIAGNOSIS — M7501 Adhesive capsulitis of right shoulder: Secondary | ICD-10-CM | POA: Diagnosis not present

## 2018-10-06 DIAGNOSIS — S46191A Other injury of muscle, fascia and tendon of long head of biceps, right arm, initial encounter: Secondary | ICD-10-CM | POA: Diagnosis not present

## 2018-10-06 DIAGNOSIS — R531 Weakness: Secondary | ICD-10-CM | POA: Diagnosis not present

## 2018-10-06 DIAGNOSIS — M25511 Pain in right shoulder: Secondary | ICD-10-CM | POA: Diagnosis not present

## 2018-10-07 DIAGNOSIS — F411 Generalized anxiety disorder: Secondary | ICD-10-CM | POA: Diagnosis not present

## 2018-10-08 DIAGNOSIS — M25511 Pain in right shoulder: Secondary | ICD-10-CM | POA: Diagnosis not present

## 2018-10-08 DIAGNOSIS — S46191A Other injury of muscle, fascia and tendon of long head of biceps, right arm, initial encounter: Secondary | ICD-10-CM | POA: Diagnosis not present

## 2018-10-08 DIAGNOSIS — M7501 Adhesive capsulitis of right shoulder: Secondary | ICD-10-CM | POA: Diagnosis not present

## 2018-10-08 DIAGNOSIS — M25611 Stiffness of right shoulder, not elsewhere classified: Secondary | ICD-10-CM | POA: Diagnosis not present

## 2018-10-08 DIAGNOSIS — R531 Weakness: Secondary | ICD-10-CM | POA: Diagnosis not present

## 2018-10-13 DIAGNOSIS — S46191A Other injury of muscle, fascia and tendon of long head of biceps, right arm, initial encounter: Secondary | ICD-10-CM | POA: Diagnosis not present

## 2018-10-13 DIAGNOSIS — M25611 Stiffness of right shoulder, not elsewhere classified: Secondary | ICD-10-CM | POA: Diagnosis not present

## 2018-10-13 DIAGNOSIS — M7501 Adhesive capsulitis of right shoulder: Secondary | ICD-10-CM | POA: Diagnosis not present

## 2018-10-13 DIAGNOSIS — R531 Weakness: Secondary | ICD-10-CM | POA: Diagnosis not present

## 2018-10-13 DIAGNOSIS — M25511 Pain in right shoulder: Secondary | ICD-10-CM | POA: Diagnosis not present

## 2018-10-15 DIAGNOSIS — M25511 Pain in right shoulder: Secondary | ICD-10-CM | POA: Diagnosis not present

## 2018-10-15 DIAGNOSIS — M7501 Adhesive capsulitis of right shoulder: Secondary | ICD-10-CM | POA: Diagnosis not present

## 2018-10-15 DIAGNOSIS — R531 Weakness: Secondary | ICD-10-CM | POA: Diagnosis not present

## 2018-10-15 DIAGNOSIS — M25611 Stiffness of right shoulder, not elsewhere classified: Secondary | ICD-10-CM | POA: Diagnosis not present

## 2018-10-15 DIAGNOSIS — S46191A Other injury of muscle, fascia and tendon of long head of biceps, right arm, initial encounter: Secondary | ICD-10-CM | POA: Diagnosis not present

## 2018-10-16 DIAGNOSIS — M25511 Pain in right shoulder: Secondary | ICD-10-CM | POA: Diagnosis not present

## 2018-10-16 DIAGNOSIS — M7501 Adhesive capsulitis of right shoulder: Secondary | ICD-10-CM | POA: Diagnosis not present

## 2018-10-21 DIAGNOSIS — F411 Generalized anxiety disorder: Secondary | ICD-10-CM | POA: Diagnosis not present

## 2018-10-24 DIAGNOSIS — M25511 Pain in right shoulder: Secondary | ICD-10-CM | POA: Diagnosis not present

## 2018-10-28 DIAGNOSIS — F411 Generalized anxiety disorder: Secondary | ICD-10-CM | POA: Diagnosis not present

## 2018-10-30 DIAGNOSIS — M7501 Adhesive capsulitis of right shoulder: Secondary | ICD-10-CM | POA: Diagnosis not present

## 2018-10-30 DIAGNOSIS — M25511 Pain in right shoulder: Secondary | ICD-10-CM | POA: Diagnosis not present

## 2018-11-03 DIAGNOSIS — M25611 Stiffness of right shoulder, not elsewhere classified: Secondary | ICD-10-CM | POA: Diagnosis not present

## 2018-11-03 DIAGNOSIS — M7501 Adhesive capsulitis of right shoulder: Secondary | ICD-10-CM | POA: Diagnosis not present

## 2018-11-03 DIAGNOSIS — M25511 Pain in right shoulder: Secondary | ICD-10-CM | POA: Diagnosis not present

## 2018-11-03 DIAGNOSIS — R531 Weakness: Secondary | ICD-10-CM | POA: Diagnosis not present

## 2018-11-03 DIAGNOSIS — S46191A Other injury of muscle, fascia and tendon of long head of biceps, right arm, initial encounter: Secondary | ICD-10-CM | POA: Diagnosis not present

## 2018-11-04 DIAGNOSIS — F411 Generalized anxiety disorder: Secondary | ICD-10-CM | POA: Diagnosis not present

## 2018-11-05 DIAGNOSIS — S46191A Other injury of muscle, fascia and tendon of long head of biceps, right arm, initial encounter: Secondary | ICD-10-CM | POA: Diagnosis not present

## 2018-11-05 DIAGNOSIS — M25611 Stiffness of right shoulder, not elsewhere classified: Secondary | ICD-10-CM | POA: Diagnosis not present

## 2018-11-05 DIAGNOSIS — M25511 Pain in right shoulder: Secondary | ICD-10-CM | POA: Diagnosis not present

## 2018-11-05 DIAGNOSIS — M7501 Adhesive capsulitis of right shoulder: Secondary | ICD-10-CM | POA: Diagnosis not present

## 2018-11-05 DIAGNOSIS — R531 Weakness: Secondary | ICD-10-CM | POA: Diagnosis not present

## 2018-11-11 DIAGNOSIS — F411 Generalized anxiety disorder: Secondary | ICD-10-CM | POA: Diagnosis not present

## 2018-11-19 DIAGNOSIS — F411 Generalized anxiety disorder: Secondary | ICD-10-CM | POA: Diagnosis not present

## 2018-11-25 DIAGNOSIS — F411 Generalized anxiety disorder: Secondary | ICD-10-CM | POA: Diagnosis not present

## 2018-12-03 DIAGNOSIS — F411 Generalized anxiety disorder: Secondary | ICD-10-CM | POA: Diagnosis not present

## 2019-01-01 MED ORDER — ESZOPICLONE 2 MG PO TABS: 2.0000 mg | ORAL_TABLET | Freq: Every evening | ORAL | 3 refills | Status: DC | PRN

## 2019-01-01 MED FILL — ESZOPICLONE 2 MG TAB: 2 | 30 days supply | Qty: 30 | Fill #0

## 2019-01-01 NOTE — Telephone Encounter (Signed)
Have refilled Lunesta  2mg  30 tabs with3 refills as requested

## 2019-02-23 ENCOUNTER — Other Ambulatory Visit: Payer: Self-pay | Admitting: Internal Medicine

## 2019-02-23 DIAGNOSIS — Z8509 Personal history of malignant neoplasm of other digestive organs: Secondary | ICD-10-CM

## 2019-02-26 ENCOUNTER — Other Ambulatory Visit: Payer: 59

## 2019-03-03 ENCOUNTER — Other Ambulatory Visit: Payer: Self-pay

## 2019-03-04 ENCOUNTER — Other Ambulatory Visit: Payer: Self-pay

## 2019-03-11 ENCOUNTER — Ambulatory Visit
Admission: RE | Admit: 2019-03-11 | Discharge: 2019-03-11 | Disposition: A | Discharge status: Home / Self Care | Payer: BLUE CROSS/BLUE SHIELD | Source: Ambulatory Visit | Attending: Internal Medicine | Admitting: Internal Medicine

## 2019-03-11 ENCOUNTER — Other Ambulatory Visit: Payer: Self-pay

## 2019-03-11 DIAGNOSIS — Z8509 Personal history of malignant neoplasm of other digestive organs: Secondary | ICD-10-CM

## 2019-03-11 MED ORDER — IOPAMIDOL (ISOVUE-300) INJECTION 61%
100.0000 mL | Freq: Once | INTRAVENOUS | Status: AC | PRN
  Administered 2019-03-11: 100 mL via INTRAVENOUS

## 2019-03-31 ENCOUNTER — Encounter (INDEPENDENT_AMBULATORY_CARE_PROVIDER_SITE_OTHER): Payer: Self-pay | Admitting: Family Medicine

## 2019-03-31 NOTE — Telephone Encounter (Signed)
Dr Leafy Ro would like to bring Pana back and use a 59min slot or 2- 20 min slots.  Thank you,  Kiamesha Samet

## 2019-04-07 ENCOUNTER — Telehealth: Payer: BC Managed Care – PPO | Admitting: Family

## 2019-04-07 DIAGNOSIS — B373 Candidiasis of vulva and vagina: Secondary | ICD-10-CM

## 2019-04-07 DIAGNOSIS — B3731 Acute candidiasis of vulva and vagina: Secondary | ICD-10-CM

## 2019-04-07 MED ORDER — FLUCONAZOLE 150 MG PO TABS: 150.0000 mg | ORAL_TABLET | ORAL | 0 refills | Status: DC | PRN

## 2019-04-07 NOTE — Progress Notes (Signed)

## 2019-04-13 ENCOUNTER — Other Ambulatory Visit: Payer: Self-pay | Admitting: Medical Oncology

## 2019-04-13 DIAGNOSIS — R911 Solitary pulmonary nodule: Secondary | ICD-10-CM

## 2019-04-13 DIAGNOSIS — C49A2 Gastrointestinal stromal tumor of stomach: Secondary | ICD-10-CM

## 2019-04-13 DIAGNOSIS — Q891 Congenital malformations of adrenal gland: Secondary | ICD-10-CM

## 2019-04-15 ENCOUNTER — Encounter (INDEPENDENT_AMBULATORY_CARE_PROVIDER_SITE_OTHER): Payer: Self-pay

## 2019-04-15 ENCOUNTER — Encounter (INDEPENDENT_AMBULATORY_CARE_PROVIDER_SITE_OTHER): Payer: Self-pay | Admitting: Family Medicine

## 2019-04-15 ENCOUNTER — Ambulatory Visit (INDEPENDENT_AMBULATORY_CARE_PROVIDER_SITE_OTHER): Payer: BC Managed Care – PPO | Admitting: Family Medicine

## 2019-04-15 ENCOUNTER — Other Ambulatory Visit: Payer: Self-pay

## 2019-04-15 VITALS — BP 159/75 | HR 72 | Temp 98.0°F | Ht 65.0 in | Wt 229.0 lb

## 2019-04-15 DIAGNOSIS — E559 Vitamin D deficiency, unspecified: Secondary | ICD-10-CM

## 2019-04-15 DIAGNOSIS — E785 Hyperlipidemia, unspecified: Secondary | ICD-10-CM

## 2019-04-15 DIAGNOSIS — E119 Type 2 diabetes mellitus without complications: Secondary | ICD-10-CM

## 2019-04-15 DIAGNOSIS — E668 Other obesity: Secondary | ICD-10-CM

## 2019-04-15 DIAGNOSIS — F3289 Other specified depressive episodes: Secondary | ICD-10-CM

## 2019-04-15 DIAGNOSIS — R030 Elevated blood-pressure reading, without diagnosis of hypertension: Secondary | ICD-10-CM

## 2019-04-15 DIAGNOSIS — E278 Other specified disorders of adrenal gland: Secondary | ICD-10-CM

## 2019-04-15 DIAGNOSIS — Z6838 Body mass index (BMI) 38.0-38.9, adult: Secondary | ICD-10-CM

## 2019-04-15 DIAGNOSIS — E1169 Type 2 diabetes mellitus with other specified complication: Secondary | ICD-10-CM

## 2019-04-15 MED ORDER — VITAMIN D (ERGOCALCIFEROL) 1.25 MG (50000 UNIT) PO CAPS: 50000.0000 [IU] | ORAL_CAPSULE | ORAL | 0 refills | Status: DC

## 2019-04-15 MED ORDER — BUPROPION HCL ER (SR) 150 MG PO TB12: 150.0000 mg | ORAL_TABLET | Freq: Two times a day (BID) | ORAL | 0 refills | Status: DC

## 2019-04-15 MED ORDER — INSULIN PEN NEEDLE 32G X 4 MM MISC: 1.0000 | Freq: Every morning | 0 refills | Status: DC

## 2019-04-15 MED ORDER — ACCU-CHEK SOFTCLIX LANCETS MISC: 0 refills | Status: DC

## 2019-04-15 MED ORDER — VICTOZA 18 MG/3ML ~~LOC~~ SOPN: 1.8000 mg | PEN_INJECTOR | Freq: Every day | SUBCUTANEOUS | 0 refills | Status: DC

## 2019-04-15 MED ORDER — ATORVASTATIN CALCIUM 10 MG PO TABS: 10.0000 mg | ORAL_TABLET | Freq: Every day | ORAL | 0 refills | Status: DC

## 2019-04-15 NOTE — Progress Notes (Signed)
Chief Complaint:   OBESITY Jasmine Martinez is here to discuss her progress with her obesity treatment plan along with follow-up of her obesity related diagnoses. Jasmine Martinez is on following a lower carbohydrate, vegetable and lean protein rich diet plan and states she is following her eating plan approximately 0% of the time. Jasmine Martinez states she is doing cardio for 30 minutes 1-2 times per week.  Today's visit was #: 84 Starting weight: 260 lbs Starting date: 01/02/2016 Today's weight: 229 lbs Today's date: 04/15/2019 Total lbs lost to date: 31 Total lbs lost since last in-office visit: 0  Interim History: Jasmine Martinez's last visit was approximately 15 months ago. She has changed jobs and had a lot of stress and stress eating, and she has been gaining weight. She is ready to get back on track with her eating plan.  Subjective:   1. Type 2 diabetes mellitus without complication, without long-term current use of insulin (HCC) Jasmine Martinez has been off her medications for a while and is not checking her blood sugars. She is ready to get back on track and she requests refills today.  2. Hyperlipidemia associated with type 2 diabetes mellitus (Jasmine Martinez) Jasmine Martinez is out of her Lipitor and is due for labs. She denies chest pain.  3. Vitamin D deficiency Jasmine Martinez is out of Vit D and she is due for labs. She denies nausea, vomiting, or muscle weakness.  4. Other depression, with emotional eating Jasmine Martinez notes increased stress with changing jobs. She has been off Wellbutrin, and she feels she will do better if she restarts.  5. Elevated blood pressure reading Jasmine Martinez's blood pressure is elevated today, not normally on blood pressure medications but she has increased stress and weight gain.  6. 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 She requests a cortisol level for  reassurance.  Assessment/Plan:   1. Type 2 diabetes mellitus without complication, without long-term current use of insulin (HCC) Good blood sugar control is important to decrease the likelihood of diabetic complications such as nephropathy, neuropathy, limb loss, blindness, coronary artery disease, and death. Intensive lifestyle modification including diet, exercise and weight loss are the first line of treatment for diabetes. Jasmine Martinez agreed to start Victoza at 1.8 mg  SubQ daily with no refills, Nano needles #100, lancets #100 with no refills, and One touch glucometer. We will check labs today.  - liraglutide (VICTOZA) 18 MG/3ML SOPN; Inject 0.3 mLs (1.8 mg total) into the skin daily.  Dispense: 3 pen; Refill: 0 - Accu-Chek Softclix Lancets lancets; Use as instructed  Dispense: 100 each; Refill: 0 - Insulin Pen Needle 32G X 4 MM MISC; 1 Device by Does not apply route every morning.  Dispense: 100 each; Refill: 0  - CBC with Differential/Platelet - Folate - Hemoglobin A1c - Insulin, random - Vitamin B12 - POCT Glucose (Device for Home Use)  2. Hyperlipidemia associated with type 2 diabetes mellitus (Jasmine Martinez) Cardiovascular risk and specific lipid/LDL goals reviewed. We discussed several lifestyle modifications today and Jasmine Martinez will start her diet, and will continue to work on exercise and weight loss efforts. We will refill Lipitor for 1 month. We will check labs today. Orders and follow up as documented in patient record.   - atorvastatin (LIPITOR) 10 MG tablet; Take 1 tablet (10 mg total) by mouth daily.  Dispense: 30 tablet; Refill: 0  - Lipid Panel With LDL/HDL Ratio - T3 - T4,  free - TSH  3. Vitamin D deficiency Low Vitamin D level contributes to fatigue and are associated with obesity, breast, and colon cancer. We will refill prescription Vitamin D for 1 month. Jasmine Martinez will follow-up for routine testing of Vitamin D, at least 2-3 times per year to avoid over-replacement. We will  check labs today.  - Vitamin D, Ergocalciferol, (DRISDOL) 1.25 MG (50000 UNIT) CAPS capsule; Take 1 capsule (50,000 Units total) by mouth every 3 (three) days.  Dispense: 10 capsule; Refill: 0  - VITAMIN D 25 Hydroxy (Vit-D Deficiency, Fractures)  4. Other depression, with emotional eating Behavior modification techniques were discussed today to help Jasmine Martinez deal with her emotional/non-hunger eating behaviors. Jasmine Martinez agreed to restart Wellbutrin SR at 150 mg daily with no refills. Orders and follow up as documented in patient record.   - buPROPion (WELLBUTRIN SR) 150 MG 12 hr tablet; Take 1 tablet (150 mg total) by mouth 2 (two) times daily.  Dispense: 60 tablet; Refill: 0  5. Elevated blood pressure reading We will check labs today. Jasmine Martinez will restart her diet, and we will recheck her blood pressure in 2 to 3 weeks. - Comprehensive metabolic panel  6. Adrenal nodule (HCC) A 24 hour urine cortisol level was ordered today. Will follow up in 2 to 3 weeks.   - Cortisol, urine, free  7. Class 2 severe obesity with serious comorbidity and body mass index (BMI) of 38.0 to 38.9 in adult, unspecified obesity type Jasmine Martinez) Jasmine Martinez is currently in the action stage of change. As such, her goal is to get back to weightloss efforts . She has agreed to following a lower carbohydrate, vegetable and lean protein rich diet plan.   Behavioral modification strategies: increasing lean protein intake and decreasing simple carbohydrates.  Jasmine Martinez has agreed to follow-up with our clinic in 2 to 3 weeks. She was informed of the importance of frequent follow-up visits to maximize her success with intensive lifestyle modifications for her multiple health conditions.   Jasmine Martinez was informed we would discuss her lab results at her next visit unless there is a critical issue that needs to be addressed sooner. Jasmine Martinez agreed to keep her next visit at the agreed upon time to discuss these results.  Objective:    Blood pressure (!) 159/75, pulse 72, temperature 98 F (36.7 C), temperature source Oral, height 5\' 5"  (1.651 m), weight 229 lb (103.9 kg), SpO2 100 %. Body mass index is 38.11 kg/m.  General: Cooperative, alert, well developed, in no acute distress. HEENT: Conjunctivae and lids unremarkable. Cardiovascular: Regular rhythm.  Lungs: Normal work of breathing. Neurologic: No focal deficits.   Lab Results  Component Value Date   CREATININE 0.64 04/21/2018   BUN 14 04/21/2018   NA 139 04/21/2018   K 4.0 04/21/2018   CL 106 04/21/2018   CO2 20 04/21/2018   Lab Results  Component Value Date   ALT 14 04/21/2018   AST 13 04/21/2018   ALKPHOS 74 04/21/2018   BILITOT 0.4 04/21/2018   Lab Results  Component Value Date   HGBA1C 5.2 04/21/2018   HGBA1C 4.6 (L) 07/07/2017   HGBA1C 4.7 (L) 01/20/2017   HGBA1C 4.9 09/25/2016   HGBA1C 5.5 04/18/2016   Lab Results  Component Value Date   INSULIN 8.2 04/21/2018   INSULIN 8.1 07/07/2017   INSULIN 11.0 01/20/2017   INSULIN 11.7 09/25/2016   INSULIN 21.8 04/18/2016   Lab Results  Component Value Date   TSH 3.180 03/20/2017   Lab Results  Component Value Date   CHOL 181 04/21/2018   HDL 57 04/21/2018   LDLCALC 117 (H) 04/21/2018   TRIG 35 04/21/2018   CHOLHDL 3.4 05/05/2014   Lab Results  Component Value Date   WBC 9.8 06/13/2017   HGB 13.2 06/13/2017   HCT 37.7 06/13/2017   MCV 88.3 06/13/2017   PLT 288 06/13/2017   No results found for: IRON, TIBC, FERRITIN  Attestation Statements:   Reviewed by clinician on day of visit: allergies, medications, problem list, medical history, surgical history, family history, social history, and previous encounter notes.  Time spent on visit including pre-visit chart review and post-visit care and charting was 45 minutes.    I, Trixie Dredge, am acting as transcriptionist for Dennard Nip, MD.  I have reviewed the above documentation for accuracy and completeness, and I agree  with the above. -  Dennard Nip, MD

## 2019-04-16 LAB — COMPREHENSIVE METABOLIC PANEL
ALT: 18 IU/L (ref 0–32)
AST: 15 IU/L (ref 0–40)
Albumin/Globulin Ratio: 1.8 (ref 1.2–2.2)
Albumin: 4.7 g/dL (ref 3.8–4.8)
Alkaline Phosphatase: 73 IU/L (ref 39–117)
BUN/Creatinine Ratio: 14 (ref 9–23)
BUN: 10 mg/dL (ref 6–24)
Bilirubin Total: 0.4 mg/dL (ref 0.0–1.2)
CO2: 23 mmol/L (ref 20–29)
Calcium: 9.6 mg/dL (ref 8.7–10.2)
Chloride: 102 mmol/L (ref 96–106)
Creatinine, Ser: 0.69 mg/dL (ref 0.57–1.00)
GFR calc Af Amer: 121 mL/min/{1.73_m2} (ref >59)
GFR calc non Af Amer: 105 mL/min/{1.73_m2} (ref >59)
Globulin, Total: 2.6 g/dL (ref 1.5–4.5)
Glucose: 112 mg/dL — ABNORMAL HIGH (ref 65–99)
Potassium: 4.2 mmol/L (ref 3.5–5.2)
Sodium: 141 mmol/L (ref 134–144)
Total Protein: 7.3 g/dL (ref 6.0–8.5)

## 2019-04-16 LAB — LIPID PANEL WITH LDL/HDL RATIO
Cholesterol, Total: 182 mg/dL (ref 100–199)
HDL: 60 mg/dL (ref >39)
LDL Chol Calc (NIH): 111 mg/dL — ABNORMAL HIGH (ref 0–99)
LDL/HDL Ratio: 1.9 ratio (ref 0.0–3.2)
Triglycerides: 59 mg/dL (ref 0–149)
VLDL Cholesterol Cal: 11 mg/dL (ref 5–40)

## 2019-04-16 LAB — CBC WITH DIFFERENTIAL/PLATELET
Basophils Absolute: 0 10*3/uL (ref 0.0–0.2)
Basos: 1 %
EOS (ABSOLUTE): 0.1 10*3/uL (ref 0.0–0.4)
Eos: 1 %
Hematocrit: 40.5 % (ref 34.0–46.6)
Hemoglobin: 14 g/dL (ref 11.1–15.9)
Immature Grans (Abs): 0 10*3/uL (ref 0.0–0.1)
Immature Granulocytes: 0 %
Lymphocytes Absolute: 1.9 10*3/uL (ref 0.7–3.1)
Lymphs: 29 %
MCH: 31.7 pg (ref 26.6–33.0)
MCHC: 34.6 g/dL (ref 31.5–35.7)
MCV: 92 fL (ref 79–97)
Monocytes Absolute: 0.4 10*3/uL (ref 0.1–0.9)
Monocytes: 6 %
Neutrophils Absolute: 4.1 10*3/uL (ref 1.4–7.0)
Neutrophils: 63 %
Platelets: 289 10*3/uL (ref 150–450)
RBC: 4.42 x10E6/uL (ref 3.77–5.28)
RDW: 11.7 % (ref 11.7–15.4)
WBC: 6.5 10*3/uL (ref 3.4–10.8)

## 2019-04-16 LAB — VITAMIN B12: Vitamin B-12: 448 pg/mL (ref 232–1245)

## 2019-04-16 LAB — FOLATE: Folate: 7.4 ng/mL (ref >3.0)

## 2019-04-16 LAB — HEMOGLOBIN A1C
Est. average glucose Bld gHb Est-mCnc: 100 mg/dL
Hgb A1c MFr Bld: 5.1 % (ref 4.8–5.6)

## 2019-04-16 LAB — T4, FREE: Free T4: 1.45 ng/dL (ref 0.82–1.77)

## 2019-04-16 LAB — TSH: TSH: 3 u[IU]/mL (ref 0.450–4.500)

## 2019-04-16 LAB — VITAMIN D 25 HYDROXY (VIT D DEFICIENCY, FRACTURES): Vit D, 25-Hydroxy: 29.4 ng/mL — ABNORMAL LOW (ref 30.0–100.0)

## 2019-04-16 LAB — T3: T3, Total: 114 ng/dL (ref 71–180)

## 2019-04-16 LAB — INSULIN, RANDOM: INSULIN: 18.9 u[IU]/mL (ref 2.6–24.9)

## 2019-04-27 ENCOUNTER — Other Ambulatory Visit: Payer: BC Managed Care – PPO

## 2019-04-29 ENCOUNTER — Other Ambulatory Visit: Payer: Self-pay | Admitting: Medical Oncology

## 2019-04-29 DIAGNOSIS — C49A2 Gastrointestinal stromal tumor of stomach: Secondary | ICD-10-CM

## 2019-04-29 DIAGNOSIS — R911 Solitary pulmonary nodule: Secondary | ICD-10-CM

## 2019-05-03 ENCOUNTER — Other Ambulatory Visit: Payer: Self-pay

## 2019-05-03 ENCOUNTER — Encounter (INDEPENDENT_AMBULATORY_CARE_PROVIDER_SITE_OTHER): Payer: Self-pay | Admitting: Family Medicine

## 2019-05-03 ENCOUNTER — Ambulatory Visit (INDEPENDENT_AMBULATORY_CARE_PROVIDER_SITE_OTHER): Payer: BC Managed Care – PPO | Admitting: Family Medicine

## 2019-05-03 VITALS — BP 130/82 | HR 72 | Temp 98.2°F | Ht 65.0 in | Wt 226.0 lb

## 2019-05-03 DIAGNOSIS — Z6837 Body mass index (BMI) 37.0-37.9, adult: Secondary | ICD-10-CM

## 2019-05-03 DIAGNOSIS — F3289 Other specified depressive episodes: Secondary | ICD-10-CM | POA: Diagnosis not present

## 2019-05-03 DIAGNOSIS — Z9189 Other specified personal risk factors, not elsewhere classified: Secondary | ICD-10-CM

## 2019-05-03 DIAGNOSIS — E1169 Type 2 diabetes mellitus with other specified complication: Secondary | ICD-10-CM | POA: Diagnosis not present

## 2019-05-03 DIAGNOSIS — E559 Vitamin D deficiency, unspecified: Secondary | ICD-10-CM | POA: Diagnosis not present

## 2019-05-04 NOTE — Progress Notes (Signed)
Chief Complaint:   OBESITY Jasmine Martinez is here to discuss her progress with her obesity treatment plan along with follow-up of her obesity related diagnoses. Jasmine Martinez is on following a lower carbohydrate, vegetable and lean protein rich diet plan and states she is following her eating plan approximately 75% of the time. Jasmine Martinez states she is exercising with a personal trainer for 30 minutes 2 times per week.  Today's visit was #: 7 Starting weight: 260 lbs Starting date: 01/02/2016 Today's weight: 226 lbs Today's date: 05/03/2019 Total lbs lost to date: 34 Total lbs lost since last in-office visit: 3  Interim History: Jasmine Martinez has done well with her weight loss efforts on her Low carbohydrate plan. She feels her hunger is better controlled and she tolerates this plan overall. She is exercising again and feels well.  Subjective:   1. Vitamin D deficiency Jasmine Martinez has restarted Vit D as her level is below goal. She denies nausea, vomiting, or muscle weakness.  2. Type 2 diabetes mellitus with other specified complication, without long-term current use of insulin (HCC) Jasmine Martinez has been off her medications and has now restarted. She notes significant decrease in polyphagia and she denies hypoglycemia. She requests a new meter.   3. Other depression, with emotional eating Jasmine Martinez has restarted her Wellbutrin and is tolerating it well. Her blood pressure is not elevated and she is working on decreasing emotional eating.  4. At risk for hyperglycemia Jasmine Martinez is at increased risk for hyperglycemia due to recent weight gain and being off Victoza.   Assessment/Plan:   1. Vitamin D deficiency Low Vitamin D level contributes to fatigue and are associated with obesity, breast, and colon cancer. We will refill prescription Vitamin D for 1 month. Jasmine Martinez will follow-up for routine testing of Vitamin D, at least 2-3 times per year to avoid over-replacement. We will recheck labs in 2-3  months.  - Vitamin D, Ergocalciferol, (DRISDOL) 1.25 MG (50000 UNIT) CAPS capsule; Take 1 capsule (50,000 Units total) by mouth every 3 (three) days.  Dispense: 10 capsule; Refill: 0  2. Type 2 diabetes mellitus with other specified complication, without long-term current use of insulin (HCC) Good blood sugar control is important to decrease the likelihood of diabetic complications such as nephropathy, neuropathy, limb loss, blindness, coronary artery disease, and death. Intensive lifestyle modification including diet, exercise and weight loss are the first line of treatment for diabetes. We will refill Victoza for 1 month and send new meter and strips to the pharmacy. We will recheck labs in 2-3 months.  - Liraglutide -Weight Management (SAXENDA) 18 MG/3ML SOPN; Inject 0.5 mLs (3 mg total) into the skin daily.  Dispense: 5 pen; Refill: 0 - blood glucose meter kit and supplies KIT; Dispense based on patient and insurance preference. Use up to four times daily as directed. (FOR ICD-9 250.00, 250.01).  Dispense: 1 each; Refill: 0  3. Other depression, with emotional eating Behavior modification techniques were discussed today to help Jasmine Martinez deal with her emotional/non-hunger eating behaviors. We will refill Wellbutrin SR for 1 month. Orders and follow up as documented in patient record.   - buPROPion (WELLBUTRIN SR) 150 MG 12 hr tablet; Take 1 tablet (150 mg total) by mouth 2 (two) times daily.  Dispense: 60 tablet; Refill: 0  4. At risk for hyperglycemia Jasmine Martinez was given approximately 15 minutes of counseling today regarding prevention of hyperglycemia. She was advised of hyperglycemia causes and the fact hyperglycemia is often asymptomatic. Jasmine Martinez has restarted Victoza and diet,  and will start checking her BGs at home. She was instructed to avoid skipping meals, eat regular protein rich meals and schedule low calorie but protein rich snacks as needed.   Repetitive spaced learning was employed  today to elicit superior memory formation and behavioral change  5. Class 2 severe obesity with serious comorbidity and body mass index (BMI) of 37.0 to 37.9 in adult, unspecified obesity type [facility]) Jasmine Martinez is currently in the action stage of change. As such, her goal is to continue with weight loss efforts. She has agreed to following a lower carbohydrate, vegetable and lean protein rich diet plan.   Eating out strategies were discussed.  Exercise goals: As is, for now.  Behavioral modification strategies: emotional eating strategies.  [name] has agreed to follow-up with our clinic in 3 weeks. She was informed of the importance of frequent follow-up visits to maximize her success with intensive lifestyle modifications for her multiple health conditions.   Objective:   Blood pressure 130/82, pulse 72, temperature 98.2 F (36.8 C), temperature source Oral, height 5' 5"  (1.651 m), weight 226 lb (102.5 kg), SpO2 99 %. Body mass index is 37.61 kg/m.  General: Cooperative, alert, well developed, in no acute distress. HEENT: Conjunctivae and lids unremarkable. Cardiovascular: Regular rhythm.  Lungs: Normal work of breathing. Neurologic: No focal deficits.   Lab Results  Component Value Date   CREATININE 0.69 04/15/2019   BUN 10 04/15/2019   NA 141 04/15/2019   K 4.2 04/15/2019   CL 102 04/15/2019   CO2 23 04/15/2019   Lab Results  Component Value Date   ALT 18 04/15/2019   AST 15 04/15/2019   ALKPHOS 73 04/15/2019   BILITOT 0.4 04/15/2019   Lab Results  Component Value Date   HGBA1C 5.1 04/15/2019   HGBA1C 5.2 04/21/2018   HGBA1C 4.6 (L) 07/07/2017   HGBA1C 4.7 (L) 01/20/2017   HGBA1C 4.9 09/25/2016   Lab Results  Component Value Date   INSULIN 18.9 04/15/2019   INSULIN 8.2 04/21/2018   INSULIN 8.1 07/07/2017   INSULIN 11.0 01/20/2017   INSULIN 11.7 09/25/2016   Lab Results  Component Value Date   TSH 3.000 04/15/2019   Lab Results  Component Value Date    CHOL 182 04/15/2019   HDL 60 04/15/2019   LDLCALC 111 (H) 04/15/2019   TRIG 59 04/15/2019   CHOLHDL 3.4 05/05/2014   Lab Results  Component Value Date   WBC 6.5 04/15/2019   HGB 14.0 04/15/2019   HCT 40.5 04/15/2019   MCV 92 04/15/2019   PLT 289 04/15/2019   No results found for: IRON, TIBC, FERRITIN  Attestation Statements:   Reviewed by clinician on day of visit: allergies, medications, problem list, medical history, surgical history, family history, social history, and previous encounter notes.   I, Jasmine Martinez, am acting as transcriptionist for Dennard Nip, MD.  I have reviewed the above documentation for accuracy and completeness, and I agree with the above. -  Dennard Nip, MD

## 2019-05-05 MED ORDER — VITAMIN D (ERGOCALCIFEROL) 1.25 MG (50000 UNIT) PO CAPS: 50000.0000 [IU] | ORAL_CAPSULE | ORAL | 0 refills | Status: DC

## 2019-05-05 MED ORDER — SAXENDA 18 MG/3ML ~~LOC~~ SOPN: 3.0000 mg | PEN_INJECTOR | Freq: Every day | SUBCUTANEOUS | 0 refills | Status: DC

## 2019-05-05 MED ORDER — BLOOD GLUCOSE MONITOR KIT: PACK | 0 refills | Status: DC

## 2019-05-05 MED ORDER — BUPROPION HCL ER (SR) 150 MG PO TB12: 150.0000 mg | ORAL_TABLET | Freq: Two times a day (BID) | ORAL | 0 refills | Status: DC

## 2019-05-08 ENCOUNTER — Other Ambulatory Visit: Payer: Self-pay

## 2019-05-08 ENCOUNTER — Ambulatory Visit
Admission: RE | Admit: 2019-05-08 | Discharge: 2019-05-08 | Disposition: A | Discharge status: Home / Self Care | Payer: BC Managed Care – PPO | Source: Ambulatory Visit | Attending: Medical Oncology | Admitting: Medical Oncology

## 2019-05-08 DIAGNOSIS — C49A2 Gastrointestinal stromal tumor of stomach: Secondary | ICD-10-CM

## 2019-05-08 DIAGNOSIS — Q891 Congenital malformations of adrenal gland: Secondary | ICD-10-CM

## 2019-05-08 MED ORDER — GADOBENATE DIMEGLUMINE 529 MG/ML IV SOLN
20.0000 mL | Freq: Once | INTRAVENOUS | Status: AC | PRN
  Administered 2019-05-08: 20 mL via INTRAVENOUS

## 2019-05-11 ENCOUNTER — Encounter (INDEPENDENT_AMBULATORY_CARE_PROVIDER_SITE_OTHER): Payer: Self-pay

## 2019-05-11 ENCOUNTER — Other Ambulatory Visit (INDEPENDENT_AMBULATORY_CARE_PROVIDER_SITE_OTHER): Payer: Self-pay | Admitting: Family Medicine

## 2019-05-11 ENCOUNTER — Other Ambulatory Visit (INDEPENDENT_AMBULATORY_CARE_PROVIDER_SITE_OTHER): Payer: Self-pay

## 2019-05-11 ENCOUNTER — Ambulatory Visit
Admission: RE | Admit: 2019-05-11 | Discharge: 2019-05-11 | Disposition: A | Discharge status: Home / Self Care | Payer: BC Managed Care – PPO | Source: Ambulatory Visit | Attending: Medical Oncology | Admitting: Medical Oncology

## 2019-05-11 DIAGNOSIS — E1169 Type 2 diabetes mellitus with other specified complication: Secondary | ICD-10-CM

## 2019-05-11 DIAGNOSIS — E785 Hyperlipidemia, unspecified: Secondary | ICD-10-CM

## 2019-05-11 DIAGNOSIS — R911 Solitary pulmonary nodule: Secondary | ICD-10-CM

## 2019-05-11 DIAGNOSIS — C49A2 Gastrointestinal stromal tumor of stomach: Secondary | ICD-10-CM

## 2019-05-11 MED ORDER — BLOOD GLUCOSE MONITOR KIT: PACK | 0 refills | Status: AC

## 2019-05-11 MED ORDER — BLOOD GLUCOSE TEST VI STRP: 1.0000 | ORAL_STRIP | Freq: Four times a day (QID) | 0 refills | Status: AC

## 2019-05-11 MED ORDER — IOPAMIDOL (ISOVUE-300) INJECTION 61%
75.0000 mL | Freq: Once | INTRAVENOUS | Status: AC | PRN
  Administered 2019-05-11: 75 mL via INTRAVENOUS

## 2019-05-25 ENCOUNTER — Encounter (INDEPENDENT_AMBULATORY_CARE_PROVIDER_SITE_OTHER): Payer: Self-pay | Admitting: Family Medicine

## 2019-05-25 ENCOUNTER — Ambulatory Visit (INDEPENDENT_AMBULATORY_CARE_PROVIDER_SITE_OTHER): Payer: BC Managed Care – PPO | Admitting: Family Medicine

## 2019-05-25 ENCOUNTER — Other Ambulatory Visit: Payer: Self-pay

## 2019-05-25 VITALS — BP 108/70 | HR 72 | Temp 98.1°F | Ht 65.0 in | Wt 224.0 lb

## 2019-05-25 DIAGNOSIS — E1169 Type 2 diabetes mellitus with other specified complication: Secondary | ICD-10-CM

## 2019-05-25 DIAGNOSIS — Z9189 Other specified personal risk factors, not elsewhere classified: Secondary | ICD-10-CM

## 2019-05-25 DIAGNOSIS — E559 Vitamin D deficiency, unspecified: Secondary | ICD-10-CM

## 2019-05-25 DIAGNOSIS — E119 Type 2 diabetes mellitus without complications: Secondary | ICD-10-CM

## 2019-05-25 DIAGNOSIS — F3289 Other specified depressive episodes: Secondary | ICD-10-CM | POA: Diagnosis not present

## 2019-05-25 DIAGNOSIS — E785 Hyperlipidemia, unspecified: Secondary | ICD-10-CM

## 2019-05-25 DIAGNOSIS — Z6837 Body mass index (BMI) 37.0-37.9, adult: Secondary | ICD-10-CM

## 2019-05-26 MED ORDER — VITAMIN D (ERGOCALCIFEROL) 1.25 MG (50000 UNIT) PO CAPS: 50000.0000 [IU] | ORAL_CAPSULE | ORAL | 0 refills | Status: DC

## 2019-05-26 MED ORDER — BUPROPION HCL ER (SR) 150 MG PO TB12: 150.0000 mg | ORAL_TABLET | Freq: Two times a day (BID) | ORAL | 0 refills | Status: DC

## 2019-05-26 MED ORDER — VICTOZA 18 MG/3ML ~~LOC~~ SOPN: 1.8000 mg | PEN_INJECTOR | Freq: Every day | SUBCUTANEOUS | 0 refills | Status: DC

## 2019-05-26 MED ORDER — ATORVASTATIN CALCIUM 10 MG PO TABS: 10.0000 mg | ORAL_TABLET | Freq: Every day | ORAL | 0 refills | Status: DC

## 2019-05-26 NOTE — Progress Notes (Signed)
Chief Complaint:   OBESITY Jasmine Martinez is here to discuss her progress with her obesity treatment plan along with follow-up of her obesity related diagnoses. Jasmine Martinez is on following a lower carbohydrate, vegetable and lean protein rich diet plan and states she is following her eating plan approximately 95% of the time. Jasmine Martinez states she is exercising with a personal trainer for 45 minutes 2 times per week.  Today's visit was #: 15 Starting weight: 260 lbs Starting date: 01/02/2016 Today's weight: 224 lbs Today's date: 05/25/2019 Total lbs lost to date: 36 Total lbs lost since last in-office visit: 2  Interim History: Jasmine Martinez denies excessive hunger levels during the day with occasional evening cravings. She hosted [employer] celebrations this year and she was able to stay on the plan since she provided all the food for the family.  Subjective:   1. Type 2 diabetes mellitus without complication, without long-term current use of insulin (HCC) Jasmine Martinez's A1c on 04/15/2019 was 5.1, and insulin level was 18.9. She is tolerating GLP-1 well and she denies GI upset. I discussed labs with the patient today.  2. Hyperlipidemia associated with type 2 diabetes mellitus ([place]) Jasmine Martinez is tolerating atorvastatin 10 mg q daily. Her lipid panel on 04/15/2019, showed a total cholesterol of 182, triglycerides of 59, HDL 60, and LDL 111. I discussed labs with the patient today.  3. Vitamin D deficiency Jasmine Martinez's Vit D level on 04/15/2019 was 29.4. she is on prescription strength Vit D supplementation, and she is tolerating it well. I discussed labs with the patient today.  4. Other depression, with emotional eating Jasmine Martinez reports overall hunger levels are well controlled with only occasional evening cravings. She reports stable mood, and she denies suicidal ideas or homicidal ideas.  5. At risk for osteoporosis Jasmine Martinez is at higher risk of osteopenia and osteoporosis due to Vitamin D deficiency  and obesity.   Assessment/Plan:   1. Type 2 diabetes mellitus without complication, without long-term current use of insulin (HCC) Good blood sugar control is important to decrease the likelihood of diabetic complications such as nephropathy, neuropathy, limb loss, blindness, coronary artery disease, and death. Intensive lifestyle modification including diet, exercise and weight loss are the first line of treatment for diabetes. We will refill Victoza at 1.8 mg for 1 month.   - liraglutide (VICTOZA) 18 MG/3ML SOPN; Inject 0.3 mLs (1.8 mg total) into the skin daily.  Dispense: 3 pen; Refill: 0  2. Hyperlipidemia associated with type 2 diabetes mellitus ([place]) Cardiovascular risk and specific lipid/LDL goals reviewed. We discussed several lifestyle modifications today. [place]ra will continue her current statin therapy, and will continue to work on diet, exercise and weight loss efforts. We will refill Lipitor for 1 month. Orders and follow up as documented in patient record.   - atorvastatin (LIPITOR) 10 MG tablet; Take 1 tablet (10 mg total) by mouth daily.  Dispense: 30 tablet; Refill: 0  3. Vitamin D deficiency Low Vitamin D level contributes to fatigue and are associated with obesity, breast, and colon cancer. We will refill prescription Vitamin D for 1 month. Denyse will follow-up for routine testing of Vitamin D, at least 2-3 times per year to avoid over-replacement.  - Vitamin D, Ergocalciferol, (DRISDOL) 1.25 MG (50000 UNIT) CAPS capsule; Take 1 capsule (50,000 Units total) by mouth every 3 (three) days.  Dispense: 10 capsule; Refill: 0  4. Other depression, with emotional eating Behavior modification techniques were discussed today to help Yanelys deal with her emotional/non-hunger eating behaviors.  We will refill Wellbutrin SR for 1 month. Orders and follow up as documented in patient record.   - buPROPion (WELLBUTRIN SR) 150 MG 12 hr tablet; Take 1 tablet (150 mg total) by mouth 2  (two) times daily.  Dispense: 60 tablet; Refill: 0  5. At risk for osteoporosis Jasmine Martinez was given approximately 15 minutes of osteoporosis prevention counseling today. Jasmine Martinez is at risk for osteopenia and osteoporosis due to her Vitamin D deficiency. She was encouraged to take her Vitamin D and follow her higher calcium diet and increase strengthening exercise to help strengthen her bones and decrease her risk of osteopenia and osteoporosis.  Repetitive spaced learning was employed today to elicit superior memory formation and behavioral change.  6. Class 2 severe obesity with serious comorbidity and body mass index (BMI) of 37.0 to 37.9 in adult, unspecified obesity type (HCC) Jasmine Martinez is currently in the action stage of change. As such, her goal is to continue with weight loss efforts. She has agreed to following a lower carbohydrate, vegetable and lean protein rich diet plan.   Exercise goals: As is.  Behavioral modification strategies: increasing lean protein intake, decreasing simple carbohydrates, no skipping meals and dealing with family or coworker sabotage.  Jasmine Martinez has agreed to follow-up with our clinic in 3 to 4 weeks. She was informed of the importance of frequent follow-up visits to maximize her success with intensive lifestyle modifications for her multiple health conditions.   Objective:   Blood pressure 108/70, pulse 72, temperature 98.1 F (36.7 C), temperature source Oral, height 5\' 5"  (1.651 m), weight 224 lb (101.6 kg), SpO2 100 %. Body mass index is 37.28 kg/m.  General: Cooperative, alert, well developed, in no acute distress. HEENT: Conjunctivae and lids unremarkable. Cardiovascular: Regular rhythm.  Lungs: Normal work of breathing. Neurologic: No focal deficits.   Lab Results  Component Value Date   CREATININE 0.69 04/15/2019   BUN 10 04/15/2019   NA 141 04/15/2019   K 4.2 04/15/2019   CL 102 04/15/2019   CO2 23 04/15/2019   Lab Results  Component  Value Date   ALT 18 04/15/2019   AST 15 04/15/2019   ALKPHOS 73 04/15/2019   BILITOT 0.4 04/15/2019   Lab Results  Component Value Date   HGBA1C 5.1 04/15/2019   HGBA1C 5.2 04/21/2018   HGBA1C 4.6 (L) 07/07/2017   HGBA1C 4.7 (L) 01/20/2017   HGBA1C 4.9 09/25/2016   Lab Results  Component Value Date   INSULIN 18.9 04/15/2019   INSULIN 8.2 04/21/2018   INSULIN 8.1 07/07/2017   INSULIN 11.0 01/20/2017   INSULIN 11.7 09/25/2016   Lab Results  Component Value Date   TSH 3.000 04/15/2019   Lab Results  Component Value Date   CHOL 182 04/15/2019   HDL 60 04/15/2019   LDLCALC 111 (H) 04/15/2019   TRIG 59 04/15/2019   CHOLHDL 3.4 05/05/2014   Lab Results  Component Value Date   WBC 6.5 04/15/2019   HGB 14.0 04/15/2019   HCT 40.5 04/15/2019   MCV 92 04/15/2019   PLT 289 04/15/2019   No results found for: IRON, TIBC, FERRITIN  Attestation Statements:   Reviewed by clinician on day of visit: allergies, medications, problem list, medical history, surgical history, family history, social history, and previous encounter notes.   I, Trixie Dredge, am acting as transcriptionist for Dennard Nip, MD.  I have reviewed the above documentation for accuracy and completeness, and I agree with the above. -  Dennard Nip, MD

## 2019-06-03 LAB — HM DIABETES EYE EXAM

## 2019-06-07 ENCOUNTER — Telehealth: Payer: Self-pay | Admitting: Internal Medicine

## 2019-06-07 NOTE — Telephone Encounter (Signed)
err

## 2019-06-08 ENCOUNTER — Ambulatory Visit: Payer: BC Managed Care – PPO | Admitting: Internal Medicine

## 2019-06-15 ENCOUNTER — Ambulatory Visit: Payer: BC Managed Care – PPO | Admitting: Internal Medicine

## 2019-06-18 ENCOUNTER — Encounter (INDEPENDENT_AMBULATORY_CARE_PROVIDER_SITE_OTHER): Payer: Self-pay | Admitting: Family Medicine

## 2019-06-21 NOTE — Telephone Encounter (Signed)
Please r/s

## 2019-06-22 ENCOUNTER — Ambulatory Visit (INDEPENDENT_AMBULATORY_CARE_PROVIDER_SITE_OTHER): Payer: BC Managed Care – PPO | Admitting: Family Medicine

## 2019-10-18 ENCOUNTER — Encounter: Payer: Self-pay | Admitting: Internal Medicine

## 2019-10-18 ENCOUNTER — Ambulatory Visit (INDEPENDENT_AMBULATORY_CARE_PROVIDER_SITE_OTHER): Payer: BC Managed Care – PPO | Admitting: Internal Medicine

## 2019-10-18 ENCOUNTER — Other Ambulatory Visit: Payer: Self-pay

## 2019-10-18 VITALS — BP 120/80 | HR 75 | Ht 65.0 in | Wt 204.0 lb

## 2019-10-18 DIAGNOSIS — E1169 Type 2 diabetes mellitus with other specified complication: Secondary | ICD-10-CM | POA: Diagnosis not present

## 2019-10-18 DIAGNOSIS — Z7184 Encounter for health counseling related to travel: Secondary | ICD-10-CM

## 2019-10-18 DIAGNOSIS — E559 Vitamin D deficiency, unspecified: Secondary | ICD-10-CM

## 2019-10-18 DIAGNOSIS — C49A2 Gastrointestinal stromal tumor of stomach: Secondary | ICD-10-CM

## 2019-10-18 DIAGNOSIS — E119 Type 2 diabetes mellitus without complications: Secondary | ICD-10-CM

## 2019-10-18 DIAGNOSIS — E785 Hyperlipidemia, unspecified: Secondary | ICD-10-CM

## 2019-10-18 DIAGNOSIS — R5383 Other fatigue: Secondary | ICD-10-CM

## 2019-10-18 DIAGNOSIS — F3289 Other specified depressive episodes: Secondary | ICD-10-CM

## 2019-10-18 DIAGNOSIS — M2550 Pain in unspecified joint: Secondary | ICD-10-CM

## 2019-10-18 DIAGNOSIS — F5102 Adjustment insomnia: Secondary | ICD-10-CM

## 2019-10-19 ENCOUNTER — Other Ambulatory Visit: Payer: Self-pay | Admitting: Internal Medicine

## 2019-10-19 ENCOUNTER — Other Ambulatory Visit: Payer: Self-pay

## 2019-10-19 DIAGNOSIS — E119 Type 2 diabetes mellitus without complications: Secondary | ICD-10-CM

## 2019-10-19 DIAGNOSIS — E559 Vitamin D deficiency, unspecified: Secondary | ICD-10-CM

## 2019-10-19 DIAGNOSIS — E785 Hyperlipidemia, unspecified: Secondary | ICD-10-CM

## 2019-10-19 DIAGNOSIS — E1169 Type 2 diabetes mellitus with other specified complication: Secondary | ICD-10-CM

## 2019-10-19 DIAGNOSIS — F3289 Other specified depressive episodes: Secondary | ICD-10-CM

## 2019-10-19 LAB — CYCLIC CITRUL PEPTIDE ANTIBODY, IGG: Cyclic Citrullin Peptide Ab: 48 UNITS — ABNORMAL HIGH

## 2019-10-19 LAB — HEMOGLOBIN A1C
Hgb A1c MFr Bld: 4.8 % of total Hgb (ref <5.7)
Mean Plasma Glucose: 91 (calc)
eAG (mmol/L): 5 (calc)

## 2019-10-19 LAB — TSH: TSH: 1.95 mIU/L

## 2019-10-19 MED ORDER — VICTOZA 18 MG/3ML ~~LOC~~ SOPN: 1.8000 mg | PEN_INJECTOR | Freq: Every day | SUBCUTANEOUS | 1 refills | Status: DC

## 2019-10-19 MED ORDER — ACCU-CHEK SOFTCLIX LANCETS MISC: 1 refills | Status: AC

## 2019-10-19 MED ORDER — BUPROPION HCL ER (SR) 150 MG PO TB12: 150.0000 mg | ORAL_TABLET | Freq: Two times a day (BID) | ORAL | 1 refills | Status: DC

## 2019-10-19 MED ORDER — ATORVASTATIN CALCIUM 10 MG PO TABS: 10.0000 mg | ORAL_TABLET | Freq: Every day | ORAL | 1 refills | Status: DC

## 2019-10-19 MED ORDER — ESZOPICLONE 3 MG PO TABS: 3.0000 mg | ORAL_TABLET | Freq: Every day | ORAL | 1 refills | Status: DC

## 2019-10-19 MED ORDER — INSULIN PEN NEEDLE 32G X 4 MM MISC: 1.0000 | Freq: Every morning | 0 refills | Status: DC

## 2019-10-19 MED ORDER — ALPRAZOLAM 0.5 MG PO TABS
0.5000 mg | ORAL_TABLET | Freq: Three times a day (TID) | ORAL | 0 refills | Status: DC | PRN
Start: 2019-10-19 — End: 2019-11-25

## 2019-10-19 MED ORDER — VITAMIN D (ERGOCALCIFEROL) 1.25 MG (50000 UNIT) PO CAPS: 50000.0000 [IU] | ORAL_CAPSULE | ORAL | 0 refills | Status: DC

## 2019-10-19 NOTE — Telephone Encounter (Signed)
Gearl Kimbrough [phone]  [name] called to check on her refills, she stated she is out of all her medications and that you were going to be taking back over all PCP medications. She needs everything sent to below pharmacy.  [id number] - [place], [place] - [address] Phone:  [phone]  Fax:  [phone]

## 2019-10-19 NOTE — Telephone Encounter (Signed)
Jasmine Martinez, I was only asked about anxiety and sleep meds. Dr. Leafy Ro has been doing this. She has not had a CPE here in some time not labs except the Rheumatology labs yesterday.

## 2019-11-12 NOTE — Progress Notes (Signed)
   Subjective:    Patient ID: Jasmine Martinez, female    DOB: 1970/10/07, 49 y.o.   MRN: [id number]  HPI 49 year old Female seen for follow up on several issues.  Longstanding history of anxiety and insomnia.  She is now working from home and likes her job.  Formally worked at [employer].  There has been some situational stress in the family discussed today at length.  She would like to have prescription for Xanax.  Have prescribed 0.5 mg 3 times daily as needed for anxiety.  She also takes Lunesta 3 mg at bedtime for sleep.  She formally saw Dr. Leafy Ro for weight loss who had her on Victoza.  This will be continued.  She also will be continued on high-dose vitamin D.  Hemoglobin A1c is stable at 4.8%.  TSH is normal.  However, CCP is elevated at 48.  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  Head CT in February 2021 there showing no evidence of locally recurrent or metastatic disease  GYN exam was done by [facility].  History of irregular bleeding for several years.  Followed by Dr. Philis Pique.  Is supposed to follow-up with Dr. Philis Pique in 6 weeks and if she is still spotting is to be placed on estradiol 0.5 mg daily for at least 3 months.  Review of Systems see above     Objective:   Physical Exam  Blood pressure 120/80 pulse 75 pulse oximetry 98% weight 204 pounds BMI 33.95  Skin warm and dry.  No cervical adenopathy.  No thyromegaly.  Chest is clear.  Cardiac exam regular rate and rhythm.  No lower extremity pitting edema.  Affect thought and judgment appear to be within normal limits.      Assessment & Plan:  History of anxiety and insomnia.  Patient takes Lunesta nightly and Xanax as needed for anxiety.  These were refilled today.  She is also on Victoza to help with weight loss  Have started her on high-dose vitamin D.  Previously was on 1 capsule every 3 days per Dr. Migdalia Dk clinic.  Also have started her on Lipitor 10 mg daily.  History of  depression.  Starting her on Wellbutrin and hopefully this will give her some energy.  She will take 150 mg SR every 12 hours.  Dr. Leafy Ro had prescribed this for her previously in May.  History of obesity.  Her starting weight was 260 pounds at [facility] and she lost down to 224 pounds by May 11.

## 2019-11-12 NOTE — Patient Instructions (Addendum)
Medications refilled as requested.  Health maintenance exam scheduled for April 2022.

## 2019-11-24 ENCOUNTER — Other Ambulatory Visit: Payer: Self-pay | Admitting: Internal Medicine

## 2019-11-24 DIAGNOSIS — E559 Vitamin D deficiency, unspecified: Secondary | ICD-10-CM

## 2019-12-23 ENCOUNTER — Other Ambulatory Visit: Payer: Self-pay | Admitting: Internal Medicine

## 2019-12-23 DIAGNOSIS — E559 Vitamin D deficiency, unspecified: Secondary | ICD-10-CM

## 2019-12-26 ENCOUNTER — Other Ambulatory Visit: Payer: Self-pay | Admitting: Internal Medicine

## 2019-12-26 DIAGNOSIS — F3289 Other specified depressive episodes: Secondary | ICD-10-CM

## 2019-12-26 DIAGNOSIS — E785 Hyperlipidemia, unspecified: Secondary | ICD-10-CM

## 2019-12-26 DIAGNOSIS — E1169 Type 2 diabetes mellitus with other specified complication: Secondary | ICD-10-CM

## 2019-12-26 DIAGNOSIS — E119 Type 2 diabetes mellitus without complications: Secondary | ICD-10-CM

## 2020-01-21 ENCOUNTER — Other Ambulatory Visit: Payer: Self-pay | Admitting: Internal Medicine

## 2020-01-21 DIAGNOSIS — E559 Vitamin D deficiency, unspecified: Secondary | ICD-10-CM

## 2020-02-23 ENCOUNTER — Other Ambulatory Visit: Payer: Self-pay | Admitting: Internal Medicine

## 2020-02-23 DIAGNOSIS — E559 Vitamin D deficiency, unspecified: Secondary | ICD-10-CM

## 2020-02-25 DIAGNOSIS — F411 Generalized anxiety disorder: Secondary | ICD-10-CM | POA: Diagnosis not present

## 2020-02-27 ENCOUNTER — Other Ambulatory Visit: Payer: Self-pay | Admitting: Internal Medicine

## 2020-02-27 DIAGNOSIS — E1169 Type 2 diabetes mellitus with other specified complication: Secondary | ICD-10-CM

## 2020-02-27 DIAGNOSIS — E785 Hyperlipidemia, unspecified: Secondary | ICD-10-CM

## 2020-03-03 DIAGNOSIS — F411 Generalized anxiety disorder: Secondary | ICD-10-CM | POA: Diagnosis not present

## 2020-03-17 DIAGNOSIS — F411 Generalized anxiety disorder: Secondary | ICD-10-CM | POA: Diagnosis not present

## 2020-03-24 DIAGNOSIS — F411 Generalized anxiety disorder: Secondary | ICD-10-CM | POA: Diagnosis not present

## 2020-03-31 DIAGNOSIS — F411 Generalized anxiety disorder: Secondary | ICD-10-CM | POA: Diagnosis not present

## 2020-04-07 DIAGNOSIS — F411 Generalized anxiety disorder: Secondary | ICD-10-CM | POA: Diagnosis not present

## 2020-04-14 DIAGNOSIS — F411 Generalized anxiety disorder: Secondary | ICD-10-CM | POA: Diagnosis not present

## 2020-04-18 ENCOUNTER — Encounter: Payer: Self-pay | Admitting: Internal Medicine

## 2020-04-18 ENCOUNTER — Ambulatory Visit (INDEPENDENT_AMBULATORY_CARE_PROVIDER_SITE_OTHER): Payer: 59 | Admitting: Internal Medicine

## 2020-04-18 ENCOUNTER — Other Ambulatory Visit: Payer: Self-pay

## 2020-04-18 ENCOUNTER — Other Ambulatory Visit (HOSPITAL_COMMUNITY): Payer: Self-pay

## 2020-04-18 VITALS — BP 130/78 | HR 78 | Ht 65.0 in | Wt 228.0 lb

## 2020-04-18 DIAGNOSIS — Z8509 Personal history of malignant neoplasm of other digestive organs: Secondary | ICD-10-CM

## 2020-04-18 DIAGNOSIS — E66812 Obesity, class 2: Secondary | ICD-10-CM

## 2020-04-18 DIAGNOSIS — R768 Other specified abnormal immunological findings in serum: Secondary | ICD-10-CM

## 2020-04-18 DIAGNOSIS — E559 Vitamin D deficiency, unspecified: Secondary | ICD-10-CM | POA: Diagnosis not present

## 2020-04-18 DIAGNOSIS — E042 Nontoxic multinodular goiter: Secondary | ICD-10-CM | POA: Diagnosis not present

## 2020-04-18 DIAGNOSIS — F5102 Adjustment insomnia: Secondary | ICD-10-CM

## 2020-04-18 DIAGNOSIS — F32A Depression, unspecified: Secondary | ICD-10-CM

## 2020-04-18 DIAGNOSIS — Z23 Encounter for immunization: Secondary | ICD-10-CM

## 2020-04-18 DIAGNOSIS — E785 Hyperlipidemia, unspecified: Secondary | ICD-10-CM

## 2020-04-18 DIAGNOSIS — Z9189 Other specified personal risk factors, not elsewhere classified: Secondary | ICD-10-CM

## 2020-04-18 DIAGNOSIS — M2550 Pain in unspecified joint: Secondary | ICD-10-CM

## 2020-04-18 DIAGNOSIS — R7681 Abnormal rheumatoid factor and anti-citrullinated protein antibody without rheumatoid arthritis: Secondary | ICD-10-CM

## 2020-04-18 DIAGNOSIS — Z0000 Encounter for general adult medical examination without abnormal findings: Secondary | ICD-10-CM

## 2020-04-18 DIAGNOSIS — F3289 Other specified depressive episodes: Secondary | ICD-10-CM

## 2020-04-18 DIAGNOSIS — E278 Other specified disorders of adrenal gland: Secondary | ICD-10-CM

## 2020-04-18 DIAGNOSIS — M19011 Primary osteoarthritis, right shoulder: Secondary | ICD-10-CM

## 2020-04-18 DIAGNOSIS — E1169 Type 2 diabetes mellitus with other specified complication: Secondary | ICD-10-CM

## 2020-04-18 DIAGNOSIS — F419 Anxiety disorder, unspecified: Secondary | ICD-10-CM

## 2020-04-18 DIAGNOSIS — E119 Type 2 diabetes mellitus without complications: Secondary | ICD-10-CM | POA: Diagnosis not present

## 2020-04-18 DIAGNOSIS — Z6837 Body mass index (BMI) 37.0-37.9, adult: Secondary | ICD-10-CM | POA: Diagnosis not present

## 2020-04-18 DIAGNOSIS — C49A2 Gastrointestinal stromal tumor of stomach: Secondary | ICD-10-CM | POA: Diagnosis not present

## 2020-04-18 DIAGNOSIS — E279 Disorder of adrenal gland, unspecified: Secondary | ICD-10-CM

## 2020-04-18 DIAGNOSIS — Z8639 Personal history of other endocrine, nutritional and metabolic disease: Secondary | ICD-10-CM

## 2020-04-18 DIAGNOSIS — F5104 Psychophysiologic insomnia: Secondary | ICD-10-CM

## 2020-04-18 DIAGNOSIS — Z7184 Encounter for health counseling related to travel: Secondary | ICD-10-CM | POA: Diagnosis not present

## 2020-04-18 LAB — POCT URINALYSIS DIPSTICK
Appearance: NEGATIVE
Bilirubin, UA: NEGATIVE
Blood, UA: NEGATIVE
Glucose, UA: NEGATIVE
Ketones, UA: NEGATIVE
Leukocytes, UA: NEGATIVE
Nitrite, UA: NEGATIVE
Odor: NEGATIVE
Protein, UA: NEGATIVE
Spec Grav, UA: 1.015 (ref 1.010–1.025)
Urobilinogen, UA: 0.2 E.U./dL
pH, UA: 6 (ref 5.0–8.0)

## 2020-04-18 MED ORDER — ERGOCALCIFEROL 1.25 MG (50000 UT) PO CAPS
50000.0000 [IU] | ORAL_CAPSULE | ORAL | 3 refills | Status: DC
  Filled 2020-04-18 – 2020-05-22 (×2): qty 12, 84d supply, fill #0
  Filled 2020-09-05: qty 12, 84d supply, fill #1
  Filled 2021-01-19: qty 12, 84d supply, fill #2

## 2020-04-18 MED ORDER — ESZOPICLONE 2 MG PO TABS
2.0000 mg | ORAL_TABLET | Freq: Every evening | ORAL | 0 refills | Status: DC | PRN
  Filled 2020-04-18 – 2020-05-30 (×2): qty 90, 90d supply, fill #0

## 2020-04-18 NOTE — Progress Notes (Signed)
Subjective:    Patient ID: Jasmine Martinez, female    DOB: 20-Aug-1970, 50 y.o.   MRN: [id number]  HPI 50 year old Female seen for health maintenance exam and evaluation of medical issues.  History of adhesive capsulitis right shoulder and received injections and a lot of PT.  Was initially seen by Dr. Onnie Graham in August 2020.  History of GIST tumor followed by Dr. Angelina Ok at [facility].  CT of abdomen and pelvis in October 2021 at [facility] showed no evidence of metastatic disease in chest abdomen or pelvis.  Longstanding history of anxiety and insomnia treated with Xanax and Lunesta.  Has seen Dr. Leafy Ro in the past for weight loss.  She was on Victoza and this has been continued.  Last seen by Dr. Leafy Ro in May 2021.  History of elevated CCP of 48 in the Fall 2021 and 2 years previously had been 3.  Diabetic eye exam done in April 2022 at [facility].  She resides in [place].  Was seen at [place] in October 2021.  Family history: Colon cancer in paternal aunt and stomach cancer in paternal aunt.  Colon cancer in paternal uncle.  Stomach cancer in paternal uncle.  Lupus in paternal uncle.  Melanoma in maternal grandmother.  Diabetes in father.  Lupus and cousin.  Son has hypothyroidism.  Lung cancer in paternal grandfather.  Brain cancer in paternal grandmother.  Does not smoke or consume alcohol.  Social history: She is a widow.  Has 1 son.  Currently working again for [employer].  Parotidectomy March 2019, tonsillectomy 2002  Had MRI of the abdomen in April 2021 showing a small lipid rich right adrenal adenoma.  Patient has history of thyroid nodules and request follow-up.  We will start with an ultrasound.  In April 2021 she had CT of chest with contrast for history of lung nodules.  She had several 3 mm or less.DIAG  subpleural nodules again noted at the lung bases described previously on other exam.  Scarring was favored by  radiologist (Dr. Rolm Baptise).  Review of Systems Had colonoscopy 2018 at [place] with 5 year follow up recommended.     Objective:   Physical Exam Vital signs reviewed.  Blood pressure 130/78 BMI 37.94 weight 228 pounds pulse 78 regular pulse oximetry 98% height 5 feet 5 inches  Skin: Warm and dry.  No cervical adenopathy.  No appreciable thyromegaly or masses.  TMs are clear.  Chest is clear to auscultation.  Breasts are without masses.  Cardiac exam: Regular rate and rhythm.  Abdomen soft nondistended without hepatosplenomegaly masses or tenderness.  No lower extremity pitting edema.  Neuro is intact without focal deficits.  Affect thought and judgment are normal.       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  History of arthralgias.  History of vitamin D deficiency treated with high-dose vitamin D weekly  Insomnia treated with Lunesta  BMI 37.94-needs to work on diet and exercise  History of depression treated with Wellbutrin  History of anxiety treated with Xanax  Plan: Have ordered ultrasound of the thyroid.  Continue close follow-up status post removal of GIST tumor several years ago at [facility] and stable without recurrence.  Please work on diet exercise  and weight loss.  Hemoglobin A1c stable at 5.1% which is.  Mild elevation of LDL at 108.  Continue to watch diet.  Suggest Lipitor 10 mg daily.  Addendum: Had biopsy of right superior thyroid nodule which was solid.  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  Recommend follow-up in 1 year or as needed.  Continue close follow-up at [facility]  Have annual mammogram.  Had colonoscopy in 2018 with 10-year follow-up recommended.  Has had 3 COVID-19 vaccines.  Has had pneumococcal 23 which was  given today.  Tetanus immunization is up-to-date.  She will decrease Lunesta to 2 mg at bedtime and continue with high-dose vitamin D weekly.  Wants to be referred to cardiology-has seen Dr. Percival Spanish in the past.  Says she is going to have his tumor presented/evaluated at [place] later this year.  Colonoscopy is up-to-date.  She will see GYN soon.  Mammogram due.  Given pneumococcal 23 vaccine.  Tetanus immunization is up-to-date.  Thyroid ultrasound ordered for follow-up of history of thyroid nodules.

## 2020-04-18 NOTE — Patient Instructions (Addendum)
Decrease Lunesta to 2 mg at bedtime. Continue high dose Vitamin D. Referral to Cardiology- has seen Dr. Percival Spanish in the past. She is going to [facility] later this year for evaluation of GIST tumor. Colonoscopy is up to date. Will see GYN soon. Will have mammogram. Given Pneumococcal 23 vaccine since she has DM. Tetanus immunization is up to date.  Thyroid ultrasound ordered for follow-up of nodules.

## 2020-04-19 ENCOUNTER — Encounter: Payer: Self-pay | Admitting: Internal Medicine

## 2020-04-19 LAB — COMPLETE METABOLIC PANEL WITH GFR
AG Ratio: 1.5 (calc) (ref 1.0–2.5)
ALT: 18 U/L (ref 6–29)
AST: 15 U/L (ref 10–35)
Albumin: 4.3 g/dL (ref 3.6–5.1)
Alkaline phosphatase (APISO): 95 U/L (ref 31–125)
BUN: 16 mg/dL (ref 7–25)
CO2: 26 mmol/L (ref 20–32)
Calcium: 9.4 mg/dL (ref 8.6–10.2)
Chloride: 106 mmol/L (ref 98–110)
Creat: 0.61 mg/dL (ref 0.50–1.10)
GFR, Est African American: 125 mL/min/{1.73_m2} (ref >=60)
GFR, Est Non African American: 108 mL/min/{1.73_m2} (ref >=60)
Globulin: 2.9 g/dL (calc) (ref 1.9–3.7)
Glucose, Bld: 71 mg/dL (ref 65–99)
Potassium: 4.1 mmol/L (ref 3.5–5.3)
Sodium: 140 mmol/L (ref 135–146)
Total Bilirubin: 0.5 mg/dL (ref 0.2–1.2)
Total Protein: 7.2 g/dL (ref 6.1–8.1)

## 2020-04-19 LAB — MICROALBUMIN / CREATININE URINE RATIO
Creatinine, Urine: 29 mg/dL (ref 20–275)
Microalb, Ur: <0.2 mg/dL

## 2020-04-19 LAB — CBC WITH DIFFERENTIAL/PLATELET
Absolute Monocytes: 486 cells/uL (ref 200–950)
Basophils Absolute: 32 cells/uL (ref 0–200)
Basophils Relative: 0.4 %
Eosinophils Absolute: 49 cells/uL (ref 15–500)
Eosinophils Relative: 0.6 %
HCT: 40.1 % (ref 35.0–45.0)
Hemoglobin: 13 g/dL (ref 11.7–15.5)
Lymphs Abs: 1604 cells/uL (ref 850–3900)
MCH: 29.7 pg (ref 27.0–33.0)
MCHC: 32.4 g/dL (ref 32.0–36.0)
MCV: 91.6 fL (ref 80.0–100.0)
MPV: 10.3 fL (ref 7.5–12.5)
Monocytes Relative: 6 %
Neutro Abs: 5929 cells/uL (ref 1500–7800)
Neutrophils Relative %: 73.2 %
Platelets: 288 10*3/uL (ref 140–400)
RBC: 4.38 10*6/uL (ref 3.80–5.10)
RDW: 11.8 % (ref 11.0–15.0)
Total Lymphocyte: 19.8 %
WBC: 8.1 10*3/uL (ref 3.8–10.8)

## 2020-04-19 LAB — VITAMIN D 25 HYDROXY (VIT D DEFICIENCY, FRACTURES): Vit D, 25-Hydroxy: 85 ng/mL (ref 30–100)

## 2020-04-19 LAB — HEMOGLOBIN A1C
Hgb A1c MFr Bld: 5.1 % of total Hgb (ref <5.7)
Mean Plasma Glucose: 100 mg/dL
eAG (mmol/L): 5.5 mmol/L

## 2020-04-19 LAB — TSH: TSH: 3.3 mIU/L

## 2020-04-19 LAB — LIPID PANEL
Cholesterol: 181 mg/dL (ref <200)
HDL: 60 mg/dL (ref >=50)
LDL Cholesterol (Calc): 108 mg/dL (calc) — ABNORMAL HIGH
Non-HDL Cholesterol (Calc): 121 mg/dL (calc) (ref <130)
Total CHOL/HDL Ratio: 3 (calc) (ref <5.0)
Triglycerides: 44 mg/dL (ref <150)

## 2020-04-20 DIAGNOSIS — H52223 Regular astigmatism, bilateral: Secondary | ICD-10-CM | POA: Diagnosis not present

## 2020-04-20 DIAGNOSIS — H524 Presbyopia: Secondary | ICD-10-CM | POA: Diagnosis not present

## 2020-04-20 DIAGNOSIS — E119 Type 2 diabetes mellitus without complications: Secondary | ICD-10-CM | POA: Diagnosis not present

## 2020-04-20 DIAGNOSIS — H5213 Myopia, bilateral: Secondary | ICD-10-CM | POA: Diagnosis not present

## 2020-04-20 LAB — HM DIABETES EYE EXAM

## 2020-04-21 DIAGNOSIS — F411 Generalized anxiety disorder: Secondary | ICD-10-CM | POA: Diagnosis not present

## 2020-04-27 DIAGNOSIS — Z8509 Personal history of malignant neoplasm of other digestive organs: Secondary | ICD-10-CM | POA: Diagnosis not present

## 2020-04-27 DIAGNOSIS — Z09 Encounter for follow-up examination after completed treatment for conditions other than malignant neoplasm: Secondary | ICD-10-CM | POA: Diagnosis not present

## 2020-04-27 DIAGNOSIS — Z85831 Personal history of malignant neoplasm of soft tissue: Secondary | ICD-10-CM | POA: Diagnosis not present

## 2020-04-28 ENCOUNTER — Other Ambulatory Visit (HOSPITAL_COMMUNITY): Payer: Self-pay

## 2020-05-04 ENCOUNTER — Ambulatory Visit
Admission: RE | Admit: 2020-05-04 | Discharge: 2020-05-04 | Disposition: A | Discharge status: Home / Self Care | Payer: 59 | Source: Ambulatory Visit | Attending: Internal Medicine | Admitting: Internal Medicine

## 2020-05-04 DIAGNOSIS — E042 Nontoxic multinodular goiter: Secondary | ICD-10-CM | POA: Diagnosis not present

## 2020-05-05 ENCOUNTER — Other Ambulatory Visit: Payer: Self-pay

## 2020-05-05 DIAGNOSIS — E041 Nontoxic single thyroid nodule: Secondary | ICD-10-CM

## 2020-05-10 ENCOUNTER — Other Ambulatory Visit: Payer: 59

## 2020-05-11 ENCOUNTER — Ambulatory Visit
Admission: RE | Admit: 2020-05-11 | Discharge: 2020-05-11 | Disposition: A | Discharge status: Home / Self Care | Payer: 59 | Source: Ambulatory Visit | Attending: Internal Medicine | Admitting: Internal Medicine

## 2020-05-11 ENCOUNTER — Other Ambulatory Visit (HOSPITAL_COMMUNITY)
Admission: RE | Admit: 2020-05-11 | Discharge: 2020-05-11 | Disposition: A | Discharge status: Home / Self Care | Payer: 59 | Source: Ambulatory Visit | Attending: Internal Medicine | Admitting: Internal Medicine

## 2020-05-11 DIAGNOSIS — D34 Benign neoplasm of thyroid gland: Secondary | ICD-10-CM | POA: Insufficient documentation

## 2020-05-11 DIAGNOSIS — E041 Nontoxic single thyroid nodule: Secondary | ICD-10-CM

## 2020-05-12 DIAGNOSIS — F411 Generalized anxiety disorder: Secondary | ICD-10-CM | POA: Diagnosis not present

## 2020-05-14 LAB — CYTOLOGY - NON PAP

## 2020-05-18 ENCOUNTER — Encounter: Payer: Self-pay | Admitting: Internal Medicine

## 2020-05-18 DIAGNOSIS — Z124 Encounter for screening for malignant neoplasm of cervix: Secondary | ICD-10-CM | POA: Diagnosis not present

## 2020-05-18 DIAGNOSIS — Z1231 Encounter for screening mammogram for malignant neoplasm of breast: Secondary | ICD-10-CM | POA: Diagnosis not present

## 2020-05-18 DIAGNOSIS — R87810 Cervical high risk human papillomavirus (HPV) DNA test positive: Secondary | ICD-10-CM | POA: Diagnosis not present

## 2020-05-18 DIAGNOSIS — Z01419 Encounter for gynecological examination (general) (routine) without abnormal findings: Secondary | ICD-10-CM | POA: Diagnosis not present

## 2020-05-18 LAB — HM MAMMOGRAPHY

## 2020-05-19 DIAGNOSIS — F411 Generalized anxiety disorder: Secondary | ICD-10-CM | POA: Diagnosis not present

## 2020-05-22 ENCOUNTER — Other Ambulatory Visit (HOSPITAL_COMMUNITY): Payer: Self-pay

## 2020-05-23 ENCOUNTER — Other Ambulatory Visit (HOSPITAL_COMMUNITY): Payer: Self-pay

## 2020-05-23 ENCOUNTER — Other Ambulatory Visit: Payer: Self-pay

## 2020-05-23 MED ORDER — BUPROPION HCL ER (SR) 150 MG PO TB12
150.0000 mg | ORAL_TABLET | Freq: Two times a day (BID) | ORAL | 0 refills | Status: DC
  Filled 2020-05-23: qty 60, 30d supply, fill #0

## 2020-05-23 MED ORDER — ALPRAZOLAM 0.5 MG PO TABS
0.5000 mg | ORAL_TABLET | Freq: Three times a day (TID) | ORAL | 3 refills | Status: DC | PRN
  Filled 2020-05-23: qty 90, 30d supply, fill #0
  Filled 2020-09-05: qty 90, 30d supply, fill #1

## 2020-05-26 DIAGNOSIS — F411 Generalized anxiety disorder: Secondary | ICD-10-CM | POA: Diagnosis not present

## 2020-05-28 ENCOUNTER — Other Ambulatory Visit: Payer: Self-pay | Admitting: Internal Medicine

## 2020-05-28 DIAGNOSIS — E1169 Type 2 diabetes mellitus with other specified complication: Secondary | ICD-10-CM

## 2020-05-30 ENCOUNTER — Other Ambulatory Visit (HOSPITAL_COMMUNITY): Payer: Self-pay

## 2020-05-31 ENCOUNTER — Other Ambulatory Visit (HOSPITAL_COMMUNITY): Payer: Self-pay

## 2020-06-02 DIAGNOSIS — F411 Generalized anxiety disorder: Secondary | ICD-10-CM | POA: Diagnosis not present

## 2020-06-07 DIAGNOSIS — Z113 Encounter for screening for infections with a predominantly sexual mode of transmission: Secondary | ICD-10-CM | POA: Diagnosis not present

## 2020-06-07 DIAGNOSIS — R87611 Atypical squamous cells cannot exclude high grade squamous intraepithelial lesion on cytologic smear of cervix (ASC-H): Secondary | ICD-10-CM | POA: Diagnosis not present

## 2020-06-07 DIAGNOSIS — N870 Mild cervical dysplasia: Secondary | ICD-10-CM | POA: Diagnosis not present

## 2020-06-07 DIAGNOSIS — N760 Acute vaginitis: Secondary | ICD-10-CM | POA: Diagnosis not present

## 2020-06-08 DIAGNOSIS — F411 Generalized anxiety disorder: Secondary | ICD-10-CM | POA: Diagnosis not present

## 2020-06-09 ENCOUNTER — Other Ambulatory Visit (HOSPITAL_COMMUNITY): Payer: Self-pay

## 2020-06-09 MED ORDER — METRONIDAZOLE 500 MG PO TABS
500.0000 mg | ORAL_TABLET | Freq: Two times a day (BID) | ORAL | 0 refills | Status: DC
  Filled 2020-06-09: qty 14, 7d supply, fill #0

## 2020-06-29 DIAGNOSIS — F411 Generalized anxiety disorder: Secondary | ICD-10-CM | POA: Diagnosis not present

## 2020-07-07 DIAGNOSIS — F411 Generalized anxiety disorder: Secondary | ICD-10-CM | POA: Diagnosis not present

## 2020-07-21 DIAGNOSIS — F411 Generalized anxiety disorder: Secondary | ICD-10-CM | POA: Diagnosis not present

## 2020-08-04 DIAGNOSIS — F411 Generalized anxiety disorder: Secondary | ICD-10-CM | POA: Diagnosis not present

## 2020-08-17 DIAGNOSIS — F411 Generalized anxiety disorder: Secondary | ICD-10-CM | POA: Diagnosis not present

## 2020-08-28 ENCOUNTER — Ambulatory Visit: Payer: 59 | Admitting: Cardiology

## 2020-09-01 DIAGNOSIS — F411 Generalized anxiety disorder: Secondary | ICD-10-CM | POA: Diagnosis not present

## 2020-09-05 ENCOUNTER — Telehealth: Payer: Self-pay | Admitting: Internal Medicine

## 2020-09-05 ENCOUNTER — Other Ambulatory Visit: Payer: Self-pay | Admitting: Internal Medicine

## 2020-09-05 ENCOUNTER — Other Ambulatory Visit (HOSPITAL_COMMUNITY): Payer: Self-pay

## 2020-09-05 MED ORDER — METRONIDAZOLE 500 MG PO TABS
ORAL_TABLET | ORAL | 0 refills | Status: DC
  Filled 2020-09-05: qty 14, 7d supply, fill #0

## 2020-09-05 MED ORDER — BUPROPION HCL ER (SR) 150 MG PO TB12
ORAL_TABLET | ORAL | 2 refills | Status: DC
Start: 2020-09-05 — End: 2020-12-21
  Filled 2020-09-05: qty 180, fill #0

## 2020-09-05 MED FILL — Bupropion HCl Tab ER 12HR 150 MG: ORAL | 90 days supply | Qty: 180 | Fill #0 | Status: AC

## 2020-09-05 NOTE — Telephone Encounter (Signed)
CPE Scheduled.  RX sent to [facility].

## 2020-09-05 NOTE — Telephone Encounter (Signed)
Received Fax RX request from  [place] Phone:  [phone]  Fax:  [phone]      Medication - buPROPion [facility] SR) 150 MG 12 hr tablet  Last Refill - 05/23/2020  Last OV - 04/18/2020  Last CPE - 04/18/20  Next Appointment -

## 2020-09-05 NOTE — Addendum Note (Signed)
Addended by: Mady Haagensen on: 09/05/2020 04:56 PM   Modules accepted: Orders

## 2020-09-06 DIAGNOSIS — F411 Generalized anxiety disorder: Secondary | ICD-10-CM | POA: Diagnosis not present

## 2020-09-13 DIAGNOSIS — F411 Generalized anxiety disorder: Secondary | ICD-10-CM | POA: Diagnosis not present

## 2020-09-20 ENCOUNTER — Other Ambulatory Visit (HOSPITAL_COMMUNITY): Payer: Self-pay

## 2020-09-20 DIAGNOSIS — L509 Urticaria, unspecified: Secondary | ICD-10-CM

## 2020-09-20 MED ORDER — EPINEPHRINE 0.3 MG/0.3ML IJ SOAJ
0.3000 mg | INTRAMUSCULAR | 2 refills | Status: DC | PRN
  Filled 2020-09-20: qty 2, 30d supply, fill #0
  Filled 2021-01-19: qty 2, 30d supply, fill #1
  Filled 2021-08-11: qty 2, 30d supply, fill #2

## 2020-09-22 DIAGNOSIS — F411 Generalized anxiety disorder: Secondary | ICD-10-CM | POA: Diagnosis not present

## 2020-10-07 DIAGNOSIS — F411 Generalized anxiety disorder: Secondary | ICD-10-CM | POA: Diagnosis not present

## 2020-10-13 DIAGNOSIS — F411 Generalized anxiety disorder: Secondary | ICD-10-CM | POA: Diagnosis not present

## 2020-10-20 DIAGNOSIS — F411 Generalized anxiety disorder: Secondary | ICD-10-CM | POA: Diagnosis not present

## 2020-10-26 DIAGNOSIS — Z8509 Personal history of malignant neoplasm of other digestive organs: Secondary | ICD-10-CM | POA: Diagnosis not present

## 2020-10-26 DIAGNOSIS — Z9049 Acquired absence of other specified parts of digestive tract: Secondary | ICD-10-CM | POA: Diagnosis not present

## 2020-10-26 DIAGNOSIS — Z08 Encounter for follow-up examination after completed treatment for malignant neoplasm: Secondary | ICD-10-CM | POA: Diagnosis not present

## 2020-10-26 DIAGNOSIS — Z85831 Personal history of malignant neoplasm of soft tissue: Secondary | ICD-10-CM | POA: Diagnosis not present

## 2020-10-27 DIAGNOSIS — F411 Generalized anxiety disorder: Secondary | ICD-10-CM | POA: Diagnosis not present

## 2020-11-03 ENCOUNTER — Other Ambulatory Visit (HOSPITAL_COMMUNITY): Payer: Self-pay

## 2020-11-03 DIAGNOSIS — F4312 Post-traumatic stress disorder, chronic: Secondary | ICD-10-CM | POA: Diagnosis not present

## 2020-11-03 DIAGNOSIS — F331 Major depressive disorder, recurrent, moderate: Secondary | ICD-10-CM | POA: Diagnosis not present

## 2020-11-03 DIAGNOSIS — F411 Generalized anxiety disorder: Secondary | ICD-10-CM | POA: Diagnosis not present

## 2020-11-03 MED ORDER — AMITRIPTYLINE HCL 25 MG PO TABS
ORAL_TABLET | ORAL | 0 refills | Status: DC
  Filled 2020-11-03: qty 90, 90d supply, fill #0

## 2020-11-03 MED ORDER — BUPROPION HCL ER (XL) 300 MG PO TB24
ORAL_TABLET | ORAL | 0 refills | Status: DC
  Filled 2020-11-03: qty 90, 90d supply, fill #0

## 2020-11-06 ENCOUNTER — Other Ambulatory Visit (HOSPITAL_COMMUNITY): Payer: Self-pay

## 2020-11-09 ENCOUNTER — Other Ambulatory Visit (HOSPITAL_COMMUNITY): Payer: Self-pay

## 2020-11-10 DIAGNOSIS — F411 Generalized anxiety disorder: Secondary | ICD-10-CM | POA: Diagnosis not present

## 2020-11-14 ENCOUNTER — Encounter: Payer: Self-pay | Admitting: Cardiology

## 2020-11-14 NOTE — Progress Notes (Deleted)
Cardiology Office Note   Date:  11/14/2020   ID:  Jasmine Martinez, DOB 1970/08/16, MRN [id number]  PCP:  Elby Showers, MD  Cardiologist:   None Referring:  ***  No chief complaint on file.     History of Present Illness: Jasmine Martinez is a 50 y.o. female who is referred by *** for evaluation of ***.   She has a strong family history of CAD.  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   Heartburn    HTN (hypertension)    no meds now   Hyperlipidemia    Obesity    Palpitations    Parotid mass    right   PMDD (premenstrual dysphoric disorder) 01/02/2016   Shortness of breath    Vitamin D deficiency    Vitamin D deficiency     Past Surgical History:  Procedure Laterality Date   APPENDECTOMY     CHOLECYSTECTOMY     EUS N/A 03/23/2014   Procedure: ESOPHAGEAL ENDOSCOPIC ULTRASOUND (EUS) RADIAL;  Surgeon: Arta Silence, MD;  Location: [facility];  Service: Endoscopy;  Laterality: N/A;   FINE NEEDLE ASPIRATION N/A 03/23/2014   Procedure: FINE NEEDLE ASPIRATION (FNA) LINEAR;  Surgeon: Arta Silence, MD;  Location: [facility];  Service: Endoscopy;  Laterality: N/A;   PAROTIDECTOMY Right 04/02/2017   Procedure: RIGHT SUPERFICIAL PAROTIDECTOMY;  Surgeon: Jodi Marble, MD;  Location: [place];  Service: ENT;  Laterality: Right;   TONSILLECTOMY       Current Outpatient Medications  Medication Sig Dispense Refill   Accu-Chek Softclix Lancets lancets Use as instructed 100 each 1   ALPRAZolam (XANAX) 0.5 MG tablet Take 1 tablet (0.5 mg total) by mouth 3 (three) times daily as needed for anxiety. 90 tablet 3   amitriptyline (ELAVIL) 25 MG tablet Take 1 tablet by mouth each night 90 tablet 0   atorvastatin (LIPITOR) 10 MG tablet TAKE 1 TABLET BY  MOUTH EVERY DAY 90 tablet 3   blood glucose meter kit and supplies KIT Dispense based on patient and insurance preference. Use up to four times daily as directed.  Dx: E11.69 1 each 0   buPROPion (WELLBUTRIN SR) 150 MG 12 hr tablet Take 1 tablet by mouth 2  times daily. 180 tablet 2   buPROPion (WELLBUTRIN XL) 300 MG 24 hr tablet Take 1 tablet by mouth every morning 90 tablet 0   EPINEPHrine 0.3 mg/0.3 mL IJ SOAJ injection Inject 0.3 mg into the muscle as needed for anaphylaxis. 2 each 2   ergocalciferol (DRISDOL) 1.25 MG (50000 UT) capsule Take 1 capsule (50,000 Units total) by mouth once a week. 12 capsule 3   Eszopiclone (ESZOPICLONE) 3 MG TABS Take 1 tablet (3 mg total) by mouth at bedtime. Take immediately before bedtime 30 tablet 1   eszopiclone (LUNESTA) 2 MG TABS tablet Take 1 tablet (2 mg total) by mouth at bedtime as needed for sleep. Take immediately before bedtime 90 tablet 0   Glucose Blood (BLOOD GLUCOSE TEST STRIPS) STRP 1 each by In Vitro route 4 (four) times daily. 100 strip 0   Insulin Pen Needle (BD PEN NEEDLE NANO 2ND GEN) 32G  X 4 MM MISC 1 Package by Does not apply route 2 (two) times daily. 100 each 0   Insulin Pen Needle 32G X 4 MM MISC 1 Device by Does not apply route every morning. 100 each 0   levonorgestrel (MIRENA) 20 MCG/24HR IUD 1 each by Intrauterine route once.     Melatonin 3 MG TABS Take by mouth.     metroNIDAZOLE (FLAGYL) 500 MG tablet Take 1 tablet by mouth twice a day for 7 days 14 tablet 0   VICTOZA 18 MG/3ML SOPN INJECT 1.8 MG UNDER THE SKIN ONCE DAILY 3 mL prn   No current facility-administered medications for this visit.    Allergies:   Codeine and Hydrocodone    Social History:  The patient  reports that she has never smoked. She has never used smokeless tobacco. She reports that she does not drink alcohol and does not use drugs.   Family History:  The patient's ***family history includes Alcoholism in her mother; Anxiety disorder in her mother;  Colon cancer (age of onset: 91) in her paternal grandfather; Drug abuse in her mother; Healthy in her father and mother; Hyperlipidemia in her mother; Hypertension in her mother.    ROS:  Please see the history of present illness.   Otherwise, review of systems are positive for {NONE DEFAULTED:18576}.   All other systems are reviewed and negative.    PHYSICAL EXAM: VS:  There were no vitals taken for this visit. , BMI There is no height or weight on file to calculate BMI. GENERAL:  Well appearing HEENT:  Pupils equal round and reactive, fundi not visualized, oral mucosa unremarkable NECK:  No jugular venous distention, waveform within normal limits, carotid upstroke brisk and symmetric, no bruits, no thyromegaly LYMPHATICS:  No cervical, inguinal adenopathy LUNGS:  Clear to auscultation bilaterally BACK:  No CVA tenderness CHEST:  Unremarkable HEART:  PMI not displaced or sustained,S1 and S2 within normal limits, no S3, no S4, no clicks, no rubs, *** murmurs ABD:  Flat, positive bowel sounds normal in frequency in pitch, no bruits, no rebound, no guarding, no midline pulsatile mass, no hepatomegaly, no splenomegaly EXT:  2 plus pulses throughout, no edema, no cyanosis no clubbing SKIN:  No rashes no nodules NEURO:  Cranial nerves II through XII grossly intact, motor grossly intact throughout PSYCH:  Cognitively intact, oriented to person place and time    EKG:  EKG {ACTION; IS/IS LKG:40102725} ordered today. The ekg ordered today demonstrates ***   Recent Labs: 04/18/2020: ALT 18; BUN 16; Creat 0.61; Hemoglobin 13.0; Platelets 288; Potassium 4.1; Sodium 140; TSH 3.30    Lipid Panel    Component Value Date/Time   CHOL 181 04/18/2020 1005   CHOL 182 04/15/2019 1358   TRIG 44 04/18/2020 1005   HDL 60 04/18/2020 1005   HDL 60 04/15/2019 1358   CHOLHDL 3.0 04/18/2020 1005   VLDL 16 05/05/2014 0902   LDLCALC 108 (H) 04/18/2020 1005      Wt Readings from Last 3 Encounters:   04/18/20 228 lb (103.4 kg)  10/18/19 204 lb (92.5 kg)  05/25/19 224 lb (101.6 kg)      Other studies Reviewed: Additional studies/ records that were reviewed today include: ***. Review of the above records demonstrates:  Please see elsewhere in the note.  ***   ASSESSMENT AND PLAN:  ***   Current medicines are reviewed at length with the patient today.  The patient {ACTIONS; HAS/DOES NOT HAVE:19233} concerns regarding medicines.  The  following changes have been made:  {PLAN; NO CHANGE:13088:s}  Labs/ tests ordered today include: *** No orders of the defined types were placed in this encounter.    Disposition:   FU with ***    Signed, Minus Breeding, MD  11/14/2020 11:22 AM    [facility]

## 2020-11-15 ENCOUNTER — Telehealth: Payer: 59 | Admitting: Physician Assistant

## 2020-11-15 ENCOUNTER — Ambulatory Visit: Payer: 59 | Admitting: Cardiology

## 2020-11-15 DIAGNOSIS — B9689 Other specified bacterial agents as the cause of diseases classified elsewhere: Secondary | ICD-10-CM

## 2020-11-15 DIAGNOSIS — J208 Acute bronchitis due to other specified organisms: Secondary | ICD-10-CM | POA: Diagnosis not present

## 2020-11-15 MED ORDER — BENZONATATE 100 MG PO CAPS: 100.0000 mg | ORAL_CAPSULE | Freq: Three times a day (TID) | ORAL | 0 refills | Status: DC | PRN

## 2020-11-15 MED ORDER — AMOXICILLIN-POT CLAVULANATE 875-125 MG PO TABS: 1.0000 | ORAL_TABLET | Freq: Two times a day (BID) | ORAL | 0 refills | Status: DC

## 2020-11-15 NOTE — Progress Notes (Signed)
I have spent 5 minutes in review of e-visit questionnaire, review and updating patient chart, medical decision making and response to patient.   Vernel Langenderfer Cody Lovelee Forner, PA-C    

## 2020-11-15 NOTE — Progress Notes (Signed)
We are sorry that you are not feeling well.  Here is how we plan to help!  Based on your presentation I believe you most likely have A cough due to bacteria.  When patients have a fever and a productive cough with a change in color or increased sputum production, we are concerned about bacterial bronchitis.  If left untreated it can progress to pneumonia.  If your symptoms do not improve with your treatment plan it is important that you contact your provider.   I have prescribed Augmentin 875-125 twice daily for 7 days. Stop the Macrobid. The Augmentin will cover both the respiratory and urinary tract infection.    In addition you may use A prescription cough medication called Tessalon Perles 100mg . You may take 1-2 capsules every 8 hours as needed for your cough.  From your responses in the eVisit questionnaire you describe inflammation in the upper respiratory tract which is causing a significant cough.  This is commonly called Bronchitis and has four common causes:   Allergies Viral Infections Acid Reflux Bacterial Infection Allergies, viruses and acid reflux are treated by controlling symptoms or eliminating the cause. An example might be a cough caused by taking certain blood pressure medications. You stop the cough by changing the medication. Another example might be a cough caused by acid reflux. Controlling the reflux helps control the cough.  USE OF BRONCHODILATOR ("RESCUE") INHALERS: There is a risk from using your bronchodilator too frequently.  The risk is that over-reliance on a medication which only relaxes the muscles surrounding the breathing tubes can reduce the effectiveness of medications prescribed to reduce swelling and congestion of the tubes themselves.  Although you feel brief relief from the bronchodilator inhaler, your asthma may actually be worsening with the tubes becoming more swollen and filled with mucus.  This can delay other crucial treatments, such as oral steroid  medications. If you need to use a bronchodilator inhaler daily, several times per day, you should discuss this with your provider.  There are probably better treatments that could be used to keep your asthma under control.     HOME CARE Only take medications as instructed by your medical team. Complete the entire course of an antibiotic. Drink plenty of fluids and get plenty of rest. Avoid close contacts especially the very young and the elderly Cover your mouth if you cough or cough into your sleeve. Always remember to wash your hands A steam or ultrasonic humidifier can help congestion.   GET HELP RIGHT AWAY IF: You develop worsening fever. You become short of breath You cough up blood. Your symptoms persist after you have completed your treatment plan MAKE SURE YOU  Understand these instructions. Will watch your condition. Will get help right away if you are not doing well or get worse.    Thank you for choosing an e-visit.  Your e-visit answers were reviewed by a board certified advanced clinical practitioner to complete your personal care plan. Depending upon the condition, your plan could have included both over the counter or prescription medications.  Please review your pharmacy choice. Make sure the pharmacy is open so you can pick up prescription now. If there is a problem, you may contact your provider through [employer] and have the prescription routed to another pharmacy.  Your safety is important to [place]. If you have drug allergies check your prescription carefully.   For the next 24 hours you can use MyChart to ask questions about today's visit, request a  non-urgent call back, or ask for a work or school excuse. You will get an email in the next two days asking about your experience. I hope that your e-visit has been valuable and will speed your recovery.

## 2020-11-15 NOTE — Addendum Note (Signed)
Addended by: Mar Daring on: 11/15/2020 03:43 PM   Modules accepted: Orders

## 2020-11-16 DIAGNOSIS — F411 Generalized anxiety disorder: Secondary | ICD-10-CM | POA: Diagnosis not present

## 2020-11-23 DIAGNOSIS — F411 Generalized anxiety disorder: Secondary | ICD-10-CM | POA: Diagnosis not present

## 2020-11-29 DIAGNOSIS — F411 Generalized anxiety disorder: Secondary | ICD-10-CM | POA: Diagnosis not present

## 2020-12-01 ENCOUNTER — Other Ambulatory Visit (HOSPITAL_COMMUNITY): Payer: Self-pay

## 2020-12-01 DIAGNOSIS — F4312 Post-traumatic stress disorder, chronic: Secondary | ICD-10-CM | POA: Diagnosis not present

## 2020-12-01 DIAGNOSIS — F411 Generalized anxiety disorder: Secondary | ICD-10-CM | POA: Diagnosis not present

## 2020-12-01 DIAGNOSIS — F331 Major depressive disorder, recurrent, moderate: Secondary | ICD-10-CM | POA: Diagnosis not present

## 2020-12-01 MED ORDER — BUPROPION HCL ER (XL) 300 MG PO TB24
ORAL_TABLET | ORAL | 0 refills | Status: DC
  Filled 2021-01-19: qty 90, 90d supply, fill #0

## 2020-12-01 MED ORDER — AMITRIPTYLINE HCL 25 MG PO TABS
ORAL_TABLET | ORAL | 0 refills | Status: DC
  Filled 2020-12-01 – 2021-01-19 (×2): qty 90, 90d supply, fill #0

## 2020-12-11 ENCOUNTER — Other Ambulatory Visit (HOSPITAL_COMMUNITY): Payer: Self-pay

## 2020-12-15 DIAGNOSIS — F411 Generalized anxiety disorder: Secondary | ICD-10-CM | POA: Diagnosis not present

## 2020-12-19 ENCOUNTER — Encounter: Payer: Self-pay | Admitting: Cardiology

## 2020-12-19 DIAGNOSIS — Z8249 Family history of ischemic heart disease and other diseases of the circulatory system: Secondary | ICD-10-CM | POA: Insufficient documentation

## 2020-12-19 NOTE — Progress Notes (Signed)
Cardiology Office Note   Date:  12/21/2020   ID:  Leonore, Frankson May 14, 1970, MRN [id number]  PCP:  Elby Showers, MD  Cardiologist:   None Referring:  Elby Showers, MD  Chief Complaint  Patient presents with   Shortness of Breath      History of Present Illness: Jasmine Martinez is a 50 y.o. female who presents for evaluation of shortness of breath, palpitations and chest pain. I saw her several years ago for chest pain.  She had a negative POET (Plain Old Exercise Treadmill).  She was referred by Dr. Renold Genta  She said she was doing okay but a year ago she had a frozen shoulder and had some steroid injection.  It made her blood sugar go up and she actually had some very rapid heart rate at 150 beats a minute.  She talked this over with her primary provider when she saw her most recently and because of this and other symptoms she was referred back.  She has not had any other tachypalpitations since then.  She has had some fleeting chest discomfort.  This has been lasting only several seconds.  It is only 2 episodes.  However, she has noticed increasing shortness of breath.  She has to go up 2 flights of stairs routinely in her home and after 1 and definitely 2 flights she is short of breath.  She has to take some time to recover.  She has gained about 40 pounds over the year and 15 pounds over 3 months as she is not being physically active.  She was working a part-time job but she quit this because of some swelling in her leg that has since resolved.  She is not describing resting shortness of breath, PND or orthopnea.  She is not having substernal chest pressure, neck or arm discomfort.   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   HTN (hypertension)    no meds now   Hyperlipidemia    Obesity    Parotid mass    right   PMDD (premenstrual  dysphoric disorder) 01/02/2016   Vitamin D deficiency     Past Surgical History:  Procedure Laterality Date   APPENDECTOMY     CHOLECYSTECTOMY     EUS N/A 03/23/2014   Procedure: ESOPHAGEAL ENDOSCOPIC ULTRASOUND (EUS) RADIAL;  Surgeon: Arta Silence, MD;  [facility];  Service: Endoscopy;  Laterality: N/A;   FINE NEEDLE ASPIRATION N/A 03/23/2014   Procedure: FINE NEEDLE ASPIRATION (FNA) LINEAR;  Surgeon: Arta Silence, MD;  Location[facility];  Service: Endoscopy;  Laterality: N/A;   PAROTIDECTOMY Right 04/02/2017   Procedure: RIGHT SUPERFICIAL PAROTIDECTOMY;  Surgeon: Jodi Marble, MD;  Location: [place];  Service: ENT;  Laterality: Right;   PARTIAL GASTRECTOMY     Stromal tumor   TONSILLECTOMY       Current Outpatient Medications  Medication Sig Dispense Refill   Accu-Chek Softclix Lancets lancets Use as instructed 100 each 1   ALPRAZolam (XANAX) 0.5 MG tablet Take 1 tablet (0.5 mg total) by mouth 3 (three) times daily as needed for anxiety. 90 tablet 3   amitriptyline (ELAVIL) 25 MG tablet Take 1 tablet by mouth at night 90 tablet 0   atorvastatin (LIPITOR) 10 MG tablet TAKE 1 TABLET BY MOUTH EVERY DAY  90 tablet 3   blood glucose meter kit and supplies KIT Dispense based on patient and insurance preference. Use up to four times daily as directed.  Dx: E11.69 1 each 0   buPROPion (WELLBUTRIN XL) 300 MG 24 hr tablet Take 1 tablet by mouth every morning. 90 tablet 0   EPINEPHrine 0.3 mg/0.3 mL IJ SOAJ injection Inject 0.3 mg into the muscle as needed for anaphylaxis. 2 each 2   ergocalciferol (DRISDOL) 1.25 MG (50000 UT) capsule Take 1 capsule (50,000 Units total) by mouth once a week. 12 capsule 3   Glucose Blood (BLOOD GLUCOSE TEST STRIPS) STRP 1 each by In Vitro route 4 (four) times daily. 100 strip 0   Insulin Pen Needle (BD PEN NEEDLE NANO 2ND GEN) 32G X 4 MM MISC 1 Package by Does not apply route 2 (two) times daily. 100 each 0   Insulin Pen  Needle 32G X 4 MM MISC 1 Device by Does not apply route every morning. 100 each 0   levonorgestrel (MIRENA) 20 MCG/24HR IUD 1 each by Intrauterine route once.     VICTOZA 18 MG/3ML SOPN INJECT 1.8 MG UNDER THE SKIN ONCE DAILY 3 mL prn   No current facility-administered medications for this visit.    Allergies:   Codeine and Hydrocodone    Social History:  The patient  reports that she has never smoked. She has never used smokeless tobacco. She reports that she does not drink alcohol and does not use drugs.   Family History:  The patient's family history includes Alcoholism in her mother; Anxiety disorder in her mother; Colon cancer (age of onset: 70) in her paternal grandfather; Drug abuse in her mother; Healthy in her father and mother; Hyperlipidemia in her mother; Hypertension in her mother.    ROS:  Please see the history of present illness.   Otherwise, review of systems are positive for none.   All other systems are reviewed and negative.    PHYSICAL EXAM: VS:  BP 112/72 (BP Location: Left Arm, Patient Position: Sitting, Cuff Size: Large)   Pulse 83   Ht 5' 5" (1.651 m)   Wt 252 lb (114.3 kg)   BMI 41.93 kg/m  , BMI Body mass index is 41.93 kg/m. GENERAL:  Well appearing HEENT:  Pupils equal round and reactive, fundi not visualized, oral mucosa unremarkable NECK:  No jugular venous distention, waveform within normal limits, carotid upstroke brisk and symmetric, no bruits, no thyromegaly LYMPHATICS:  No cervical, inguinal adenopathy LUNGS:  Clear to auscultation bilaterally BACK:  No CVA tenderness CHEST:  Unremarkable HEART:  PMI not displaced or sustained,S1 and S2 within normal limits, no S3, no S4, no clicks, no rubs, no murmurs ABD:  Flat, positive bowel sounds normal in frequency in pitch, no bruits, no rebound, no guarding, no midline pulsatile mass, no hepatomegaly, no splenomegaly EXT:  2 plus pulses throughout, no edema, no cyanosis no clubbing SKIN:  No rashes no  nodules NEURO:  Cranial nerves II through XII grossly intact, motor grossly intact throughout PSYCH:  Cognitively intact, oriented to person place and time    EKG:  EKG is ordered today. The ekg ordered today demonstrates sinus rhythm, rate 83, left axis deviation, left anterior fascicular block.  No change from previous   Recent Labs: 04/18/2020: ALT 18; BUN 16; Creat 0.61; Hemoglobin 13.0; Platelets 288; Potassium 4.1; Sodium 140; TSH 3.30    Lipid Panel    Component Value Date/Time   CHOL 181 04/18/2020 1005  CHOL 182 04/15/2019 1358   TRIG 44 04/18/2020 1005   HDL 60 04/18/2020 1005   HDL 60 04/15/2019 1358   CHOLHDL 3.0 04/18/2020 1005   VLDL 16 05/05/2014 0902   LDLCALC 108 (H) 04/18/2020 1005      Wt Readings from Last 3 Encounters:  12/21/20 252 lb (114.3 kg)  04/18/20 228 lb (103.4 kg)  10/18/19 204 lb (92.5 kg)      Other studies Reviewed: Additional studies/ records that were reviewed today include: Labs. Review of the above records demonstrates:  Please see elsewhere in the note.     ASSESSMENT AND PLAN:  SOB: I will check a BNP level.  I did notice that she did not have any coronary calcium on her recent chest CT that was done to follow some pulmonary nodules.  I do not strongly suspect a cardiac etiology otherwise.  We discussed the fact that if the BNP level comes back and she is to lose gradually weight over time and increase her physical activity and her breathing gets worse I would want her reassess possibly with a CPX.  I did note that her last treadmill test was stopped early because hypertensive blood pressure response.  HTN: As mentioned she had this with exercise but actually seems to be doing quite well and has a normal blood pressure now.  DM: Her A1c is only 5.1.  No change in therapy.  DYSLIPIDEMIA: She has an excellent lipid profile.  No change in therapy.   Current medicines are reviewed at length with the patient today.  The patient does  not have concerns regarding medicines.  The following changes have been made:  no change  Labs/ tests ordered today include:   Orders Placed This Encounter  Procedures   Brain natriuretic peptide   CBC   TSH   EKG 12-Lead      Disposition:   FU with me as needed.      Signed, Minus Breeding, MD  12/21/2020 10:00 AM    [place]

## 2020-12-21 ENCOUNTER — Ambulatory Visit: Payer: 59 | Admitting: Cardiology

## 2020-12-21 ENCOUNTER — Other Ambulatory Visit: Payer: Self-pay

## 2020-12-21 ENCOUNTER — Encounter: Payer: Self-pay | Admitting: Cardiology

## 2020-12-21 VITALS — BP 112/72 | HR 83 | Ht 65.0 in | Wt 252.0 lb

## 2020-12-21 DIAGNOSIS — R0602 Shortness of breath: Secondary | ICD-10-CM

## 2020-12-21 DIAGNOSIS — E785 Hyperlipidemia, unspecified: Secondary | ICD-10-CM

## 2020-12-21 DIAGNOSIS — I10 Essential (primary) hypertension: Secondary | ICD-10-CM | POA: Diagnosis not present

## 2020-12-21 DIAGNOSIS — E118 Type 2 diabetes mellitus with unspecified complications: Secondary | ICD-10-CM | POA: Diagnosis not present

## 2020-12-21 DIAGNOSIS — Z8249 Family history of ischemic heart disease and other diseases of the circulatory system: Secondary | ICD-10-CM | POA: Diagnosis not present

## 2020-12-21 DIAGNOSIS — F411 Generalized anxiety disorder: Secondary | ICD-10-CM | POA: Diagnosis not present

## 2020-12-21 NOTE — Patient Instructions (Signed)
Medication Instructions:  Your Physician recommend you continue on your current medication as directed.    *If you need a refill on your cardiac medications before your next appointment, please call your pharmacy*  Lab Work: Today (BNP,CBC, TSH) If you have labs (blood work) drawn today and your tests are completely normal, you will receive your results only by: [place] (if you have MyChart) OR A paper copy in the mail If you have any lab test that is abnormal or we need to change your treatment, we will call you to review the results.  Follow-Up: At [facility], you and your health needs are our priority.  As part of our continuing mission to provide you with exceptional heart care, we have created designated Provider Care Teams.  These Care Teams include your primary Cardiologist (physician) and Advanced Practice Providers (APPs -  Physician Assistants and Nurse Practitioners) who all work together to provide you with the care you need, when you need it.  We recommend signing up for the patient portal called "MyChart".  Sign up information is provided on this After Visit Summary.  MyChart is used to connect with patients for Virtual Visits (Telemedicine).  Patients are able to view lab/test results, encounter notes, upcoming appointments, etc.  Non-urgent messages can be sent to your provider as well.   To learn more about what you can do with MyChart, go to [email or web address].    Your next appointment:    As needed.  The format for your next appointment:   In Person  Provider:   Dr. Vita Barley, MD

## 2020-12-22 LAB — CBC
Hematocrit: 39 % (ref 34.0–46.6)
Hemoglobin: 13.3 g/dL (ref 11.1–15.9)
MCH: 30.1 pg (ref 26.6–33.0)
MCHC: 34.1 g/dL (ref 31.5–35.7)
MCV: 88 fL (ref 79–97)
Platelets: 293 10*3/uL (ref 150–450)
RBC: 4.42 x10E6/uL (ref 3.77–5.28)
RDW: 11.2 % — ABNORMAL LOW (ref 11.7–15.4)
WBC: 6.3 10*3/uL (ref 3.4–10.8)

## 2020-12-22 LAB — TSH: TSH: 2.82 u[IU]/mL (ref 0.450–4.500)

## 2020-12-22 LAB — BRAIN NATRIURETIC PEPTIDE: BNP: <2.5 pg/mL (ref 0.0–100.0)

## 2021-01-17 DIAGNOSIS — F411 Generalized anxiety disorder: Secondary | ICD-10-CM | POA: Diagnosis not present

## 2021-01-19 ENCOUNTER — Other Ambulatory Visit (HOSPITAL_COMMUNITY): Payer: Self-pay

## 2021-02-01 DIAGNOSIS — F411 Generalized anxiety disorder: Secondary | ICD-10-CM | POA: Diagnosis not present

## 2021-02-02 ENCOUNTER — Other Ambulatory Visit (HOSPITAL_COMMUNITY): Payer: Self-pay

## 2021-02-02 DIAGNOSIS — F411 Generalized anxiety disorder: Secondary | ICD-10-CM | POA: Diagnosis not present

## 2021-02-02 DIAGNOSIS — F4312 Post-traumatic stress disorder, chronic: Secondary | ICD-10-CM | POA: Diagnosis not present

## 2021-02-02 DIAGNOSIS — F331 Major depressive disorder, recurrent, moderate: Secondary | ICD-10-CM | POA: Diagnosis not present

## 2021-02-02 MED ORDER — BUPROPION HCL ER (XL) 300 MG PO TB24
300.0000 mg | ORAL_TABLET | Freq: Every morning | ORAL | 0 refills | Status: DC
  Filled 2021-02-02: qty 90, 90d supply, fill #0

## 2021-02-02 MED ORDER — AMITRIPTYLINE HCL 50 MG PO TABS
50.0000 mg | ORAL_TABLET | Freq: Every evening | ORAL | 0 refills | Status: DC
  Filled 2021-02-02: qty 90, 90d supply, fill #0

## 2021-02-09 DIAGNOSIS — F411 Generalized anxiety disorder: Secondary | ICD-10-CM | POA: Diagnosis not present

## 2021-02-13 DIAGNOSIS — F411 Generalized anxiety disorder: Secondary | ICD-10-CM | POA: Diagnosis not present

## 2021-02-22 DIAGNOSIS — F411 Generalized anxiety disorder: Secondary | ICD-10-CM | POA: Diagnosis not present

## 2021-02-26 ENCOUNTER — Telehealth: Payer: 59 | Admitting: Nurse Practitioner

## 2021-02-26 ENCOUNTER — Encounter: Payer: Self-pay | Admitting: Internal Medicine

## 2021-02-26 DIAGNOSIS — F411 Generalized anxiety disorder: Secondary | ICD-10-CM | POA: Diagnosis not present

## 2021-02-26 DIAGNOSIS — M545 Low back pain, unspecified: Secondary | ICD-10-CM | POA: Diagnosis not present

## 2021-02-26 DIAGNOSIS — M549 Dorsalgia, unspecified: Secondary | ICD-10-CM | POA: Insufficient documentation

## 2021-02-26 MED ORDER — NAPROXEN 500 MG PO TABS: 500.0000 mg | ORAL_TABLET | Freq: Two times a day (BID) | ORAL | 0 refills | Status: AC | PRN

## 2021-02-26 MED ORDER — CYCLOBENZAPRINE HCL 10 MG PO TABS
10.0000 mg | ORAL_TABLET | Freq: Three times a day (TID) | ORAL | 0 refills | Status: DC | PRN
Start: 2021-02-26 — End: 2022-03-21

## 2021-02-26 NOTE — Progress Notes (Signed)
We are sorry that you are not feeling well.  Here is how we plan to help!  Based on what you have shared with me it looks like you mostly have acute back pain.  Acute back pain is defined as musculoskeletal pain that can resolve in 1-3 weeks with conservative treatment.  I have prescribed Naprosyn 500 mg take one by mouth twice a day non-steroid anti-inflammatory (NSAID) as well as Flexeril 10 mg every eight hours as needed which is a muscle relaxer  Some patients experience stomach irritation or in increased heartburn with anti-inflammatory drugs.  Please keep in mind that muscle relaxer's can cause fatigue and should not be taken while at work or driving.  Back pain is very common.  The pain often gets better over time.  The cause of back pain is usually not dangerous.  Most people can learn to manage their back pain on their own.  Home Care  Stay active.  Start with short walks on flat ground if you can.  Try to walk farther each day.  Do not sit, drive or stand in one place for more than 30 minutes.  Do not stay in bed.  Do not avoid exercise or work.  Activity can help your back heal faster.  Be careful when you bend or lift an object.  Bend at your knees, keep the object close to you, and do not twist.  Sleep on a firm mattress.  Lie on your side, and bend your knees.  If you lie on your back, put a pillow under your knees.  Only take medicines as told by your doctor.  Put ice on the injured area.  Put ice in a plastic bag  Place a towel between your skin and the bag  Leave the ice on for 15-20 minutes, 3-4 times a day for the first 2-3 days. 210 After that, you can switch between ice and heat packs.  Ask your doctor about back exercises or massage.  Avoid feeling anxious or stressed.  Find good ways to deal with stress, such as exercise.  Get Help Right Way If:  Your pain does not go away with rest or medicine.  Your pain does not go away in 1 week.  You have new  problems.  You do not feel well.  The pain spreads into your legs.  You cannot control when you poop (bowel movement) or pee (urinate)  You feel sick to your stomach (nauseous) or throw up (vomit)  You have belly (abdominal) pain.  You feel like you may pass out (faint).  If you develop a fever.  Make Sure you:  Understand these instructions.  Will watch your condition  Will get help right away if you are not doing well or get worse.  Your e-visit answers were reviewed by a board certified advanced clinical practitioner to complete your personal care plan.  Depending on the condition, your plan could have included both over the counter or prescription medications.  If there is a problem please reply  once you have received a response from your provider.  Your safety is important to us.  If you have drug allergies check your prescription carefully.    You can use MyChart to ask questions about today's visit, request a non-urgent call back, or ask for a work or school excuse for 24 hours related to this e-Visit. If it has been greater than 24 hours you will need to follow up with your provider, or enter a   your e-visit has been valuable and will speed your recovery. Thank you for using e-visits.   I spent approximately 7 minutes reviewing the patient's history, current symptoms and coordinating their plan of care today.   Meds ordered this encounter  Medications   naproxen (NAPROSYN) 500 MG tablet    Sig: Take 1 tablet (500 mg total) by mouth 2 (two) times daily as needed for up to 7 days for moderate pain.    Dispense:  14 tablet    Refill:  0   cyclobenzaprine (FLEXERIL) 10 MG tablet    Sig: Take 1 tablet (10 mg total) by mouth 3 (three) times daily as needed for muscle spasms.    Dispense:  30 tablet    Refill:  0

## 2021-02-27 DIAGNOSIS — F411 Generalized anxiety disorder: Secondary | ICD-10-CM | POA: Diagnosis not present

## 2021-03-09 DIAGNOSIS — F411 Generalized anxiety disorder: Secondary | ICD-10-CM | POA: Diagnosis not present

## 2021-03-15 DIAGNOSIS — F411 Generalized anxiety disorder: Secondary | ICD-10-CM | POA: Diagnosis not present

## 2021-03-17 ENCOUNTER — Telehealth: Payer: 59 | Admitting: Nurse Practitioner

## 2021-03-17 DIAGNOSIS — H00011 Hordeolum externum right upper eyelid: Secondary | ICD-10-CM

## 2021-03-17 NOTE — Progress Notes (Signed)
Based on what you shared with me, I feel your condition warrants further evaluation and I recommend that you be seen for a face to face visit due to your request for by mouth antibiotics instead of a topical antibiotic ointment.  Please contact your primary care physician practice to be seen or the nearest urgent care.  ?NOTE: You will NOT be charged for this eVisit. ? ?If you do not have a PCP, [facility] offers a free physician referral service available at [phone]. Our trained staff has the experience, knowledge and resources to put you in touch with a physician who is right for you.  ? ? ?If you are having a true medical emergency please call 911. ? ? ?Your e-visit answers were reviewed by a board certified advanced clinical practitioner to complete your personal care plan.  Thank you for using e-Visits.  ?

## 2021-03-22 DIAGNOSIS — F411 Generalized anxiety disorder: Secondary | ICD-10-CM | POA: Diagnosis not present

## 2021-04-04 DIAGNOSIS — F411 Generalized anxiety disorder: Secondary | ICD-10-CM | POA: Diagnosis not present

## 2021-04-12 ENCOUNTER — Other Ambulatory Visit: Payer: 59

## 2021-04-12 DIAGNOSIS — F411 Generalized anxiety disorder: Secondary | ICD-10-CM | POA: Diagnosis not present

## 2021-04-14 ENCOUNTER — Telehealth: Payer: 59 | Admitting: Nurse Practitioner

## 2021-04-14 DIAGNOSIS — R399 Unspecified symptoms and signs involving the genitourinary system: Secondary | ICD-10-CM | POA: Diagnosis not present

## 2021-04-14 MED ORDER — NITROFURANTOIN MONOHYD MACRO 100 MG PO CAPS: 100.0000 mg | ORAL_CAPSULE | Freq: Two times a day (BID) | ORAL | 0 refills | Status: AC

## 2021-04-14 NOTE — Progress Notes (Signed)

## 2021-04-14 NOTE — Progress Notes (Signed)
I have spent 5 minutes in review of e-visit questionnaire, review and updating patient chart, medical decision making and response to patient.  ° °Emalee Knies W Aliyha Fornes, NP ° °  °

## 2021-04-19 ENCOUNTER — Ambulatory Visit (INDEPENDENT_AMBULATORY_CARE_PROVIDER_SITE_OTHER): Payer: 59 | Admitting: Internal Medicine

## 2021-04-19 ENCOUNTER — Encounter: Payer: Self-pay | Admitting: Internal Medicine

## 2021-04-19 ENCOUNTER — Other Ambulatory Visit: Payer: 59

## 2021-04-19 VITALS — BP 120/80 | HR 83 | Temp 97.2°F | Ht 65.0 in | Wt 266.0 lb

## 2021-04-19 DIAGNOSIS — R768 Other specified abnormal immunological findings in serum: Secondary | ICD-10-CM | POA: Diagnosis not present

## 2021-04-19 DIAGNOSIS — F5104 Psychophysiologic insomnia: Secondary | ICD-10-CM

## 2021-04-19 DIAGNOSIS — E041 Nontoxic single thyroid nodule: Secondary | ICD-10-CM

## 2021-04-19 DIAGNOSIS — E785 Hyperlipidemia, unspecified: Secondary | ICD-10-CM | POA: Diagnosis not present

## 2021-04-19 DIAGNOSIS — C49A2 Gastrointestinal stromal tumor of stomach: Secondary | ICD-10-CM

## 2021-04-19 DIAGNOSIS — F3289 Other specified depressive episodes: Secondary | ICD-10-CM

## 2021-04-19 DIAGNOSIS — E042 Nontoxic multinodular goiter: Secondary | ICD-10-CM | POA: Diagnosis not present

## 2021-04-19 DIAGNOSIS — E1169 Type 2 diabetes mellitus with other specified complication: Secondary | ICD-10-CM | POA: Diagnosis not present

## 2021-04-19 DIAGNOSIS — F419 Anxiety disorder, unspecified: Secondary | ICD-10-CM | POA: Diagnosis not present

## 2021-04-19 DIAGNOSIS — Z6841 Body Mass Index (BMI) 40.0 and over, adult: Secondary | ICD-10-CM

## 2021-04-19 DIAGNOSIS — M2550 Pain in unspecified joint: Secondary | ICD-10-CM

## 2021-04-19 DIAGNOSIS — E119 Type 2 diabetes mellitus without complications: Secondary | ICD-10-CM | POA: Diagnosis not present

## 2021-04-19 DIAGNOSIS — Z0000 Encounter for general adult medical examination without abnormal findings: Secondary | ICD-10-CM | POA: Diagnosis not present

## 2021-04-19 DIAGNOSIS — I10 Essential (primary) hypertension: Secondary | ICD-10-CM

## 2021-04-19 DIAGNOSIS — F32A Depression, unspecified: Secondary | ICD-10-CM

## 2021-04-19 DIAGNOSIS — M19011 Primary osteoarthritis, right shoulder: Secondary | ICD-10-CM

## 2021-04-19 LAB — POCT URINALYSIS DIPSTICK: Glucose, UA: NEGATIVE

## 2021-04-19 NOTE — Progress Notes (Signed)
Subjective:    Patient ID: Jasmine Martinez, female    DOB: 1970-06-16, 51 y.o.   MRN: [id number]  HPI 51 year old Female seen for health maintenance exam and evaluation of medical issues.  Fasting labs are being drawn today. She has a history of insulin-dependent diabetes  Had CT at [facility] for follow up on GIST tumor diagnosed several years ago on endoscopy and colonoscopy by Dr. Michail Sermon Review of Systems still going to [facility] re GI issues. Dr. Percival Spanish wants her lose weight. Had reaction to shellfish with rash and vomiting.Has Epipen.  Mammogram to be done at GYN.  History of anxiety and depression being seen for counseling by Allayne Butcher.  Is on amitriptyline and generic Wellbutrin.  Also takes Xanax up to 3 times daily if needed for anxiety.  History of GIST tumor followed at [facility].  This has been stable.  She has imaging per Dr. Angelina Ok at [facility].  Has upcoming MRI scheduled there later this month for follow-up.  Longstanding history of anxiety and insomnia treated with Xanax and Lunesta.  Has seen Dr. Leafy Ro in the past for weight loss.  Last visit with Dr. Leafy Ro was May 2021.  History of elevated CCP of 48 in the fall 2021 and 2 years previously had been 32.  Receives diabetic eye exam on an annual basis.  [facility]YN care.  Last note on file was May 2022.  In April 2022 had indeterminant right superior thyroid nodule and ultrasound-guided fine-needle aspiration was done.  Results were benign.  Due to musculoskeletal issues ACCP was drawn today.  Does have history of positive CCP in 1 year ago it was 12 and now it is 21.  Hemoglobin A1c is now 5.9% and was 5.1% in April 2022.  She has mildly elevated alkaline phosphatase of 134 but other liver functions are normal.  She has pure hypercholesterolemia with total cholesterol 209 and LDL cholesterol 131.  Has been placed in 2022 on Lipitor 10 mg daily.  Probably should take higher dose but she is  reluctant to do so.  She should consider coronary calcium scoring with history of diabetes.  Social history: She is a widow.  Has 1 son.  Currently working again for [employer].  Does not smoke or consume alcohol.  Parotidectomy March 2019.  Tonsillectomy 2002.  Family history: Colon cancer in paternal aunt stomach cancer in paternal aunt.  Colon cancer in paternal uncle.  Stomach cancer paternal uncle.  Lupus in paternal uncle.  Melanoma in maternal grandmother.  Diabetes in father.  Lupus and a cousin.  Son has hypothyroidism.  Lung cancer in paternal grandfather.  Brain cancer in paternal grandmother.  In April 2021 she had CT of chest with contrast for history of lung nodules.  Subpleural nodules once again noted at lung bases as previously prescribed and scarring was favored by radiologist.  Had colonoscopy 2018 in [place] with 5-year follow-up recommended.     Objective:   Physical Exam Vital signs reviewed.  Blood pressure excellent at 120/80 BMI 44.26.  Weight 266 pounds Pleasant female in no acute distress.  Skin is warm and dry.  No cervical adenopathy.  No appreciable thyromegaly.  TMs are clear.  Chest is clear.  Breast are without masses.  Cardiac exam: Regular rate and rhythm without ectopy.  Abdomen is soft, nondistended without hepatosplenomegaly, masses or tenderness.  No lower extremity pitting edema.  Brief neurological exam is intact without gross focal deficits.  Affect thought and judgment are normal.      Assessment & Plan:   History of thyroid nodules and repeat ultrasound was done in 2022 with biopsy at that time consistent with benign follicular nodule.  History of GIST tumor followed at [facility] with an upcoming MRI ordered.  Controlled type 2 diabetes mellitus currently treated with Victoza  History of arthralgias and history of positive CCP.  History of vitamin D deficiency  History of insomnia treated with Xanax  BMI has increased from  37.94-44.26.  Patient needs to be motivated to diet exercise and lose weight.  Weight loss clinic is an option.  History of depression treated with Wellbutrin, amitriptyline, and counseling.  Currently seeing Dereck Ligas.  Persistent arthropathy of right shoulder-previously evaluated  Hyperlipidemia treated with Lipitor 10 mg daily.  However low-dose statin does not seem to be adequate at this time.  Coronary calcium score is a possibility.  Her LDL has increased from 108 last year to 131.  Total cholesterol has increased from 1 81-2 09.  She is reluctant to increase her statin medication.  Dr. Percival Spanish talked about coronary calcium scoring when he saw her in December 2022.  Plan: Has upcoming MRI ordered at [facility].  Currently seeing Dereck Ligas for counseling and is on Elavil, Wellbutrin, amitriptyline.  Takes Xanax for anxiety and sleep.  Continue current medications and follow-up in 1 year or as needed.  Would like to see her return to weight loss center.  Elavil may be contributing to weight gain as well as inactivity.

## 2021-04-19 NOTE — Patient Instructions (Addendum)
It was a pleasure to see you today.  Weight gain is of concern.  Consider coronary calcium scoring to determine degree of atherosclerosis with hyperlipidemia.  Follow-up with your counseling and medications for anxiety and depression with Dereck Ligas.  Has upcoming MRI ordered at [facility] for evaluation of GIST tumor.  Please try to diet exercise and lose weight.  May benefit from going back to weight loss clinic.  Return in 1 year or as needed.

## 2021-04-20 DIAGNOSIS — F411 Generalized anxiety disorder: Secondary | ICD-10-CM | POA: Diagnosis not present

## 2021-04-20 LAB — CBC WITH DIFFERENTIAL/PLATELET
Absolute Monocytes: 416 cells/uL (ref 200–950)
Basophils Absolute: 29 cells/uL (ref 0–200)
Basophils Relative: 0.4 %
Eosinophils Absolute: 73 cells/uL (ref 15–500)
Eosinophils Relative: 1 %
HCT: 41.9 % (ref 35.0–45.0)
Hemoglobin: 14.3 g/dL (ref 11.7–15.5)
Lymphs Abs: 1540 cells/uL (ref 850–3900)
MCH: 30.9 pg (ref 27.0–33.0)
MCHC: 34.1 g/dL (ref 32.0–36.0)
MCV: 90.5 fL (ref 80.0–100.0)
MPV: 10.5 fL (ref 7.5–12.5)
Monocytes Relative: 5.7 %
Neutro Abs: 5241 cells/uL (ref 1500–7800)
Neutrophils Relative %: 71.8 %
Platelets: 329 10*3/uL (ref 140–400)
RBC: 4.63 10*6/uL (ref 3.80–5.10)
RDW: 11.7 % (ref 11.0–15.0)
Total Lymphocyte: 21.1 %
WBC: 7.3 10*3/uL (ref 3.8–10.8)

## 2021-04-20 LAB — COMPLETE METABOLIC PANEL WITHOUT GFR
AG Ratio: 1.3 (calc) (ref 1.0–2.5)
ALT: 14 U/L (ref 6–29)
AST: 12 U/L (ref 10–35)
Albumin: 4.6 g/dL (ref 3.6–5.1)
Alkaline phosphatase (APISO): 134 U/L — ABNORMAL HIGH (ref 31–125)
BUN: 14 mg/dL (ref 7–25)
CO2: 26 mmol/L (ref 20–32)
Calcium: 9.9 mg/dL (ref 8.6–10.2)
Chloride: 104 mmol/L (ref 98–110)
Creat: 0.71 mg/dL (ref 0.50–0.99)
Globulin: 3.5 g/dL (ref 1.9–3.7)
Glucose, Bld: 87 mg/dL (ref 65–99)
Potassium: 4 mmol/L (ref 3.5–5.3)
Sodium: 139 mmol/L (ref 135–146)
Total Bilirubin: 0.5 mg/dL (ref 0.2–1.2)
Total Protein: 8.1 g/dL (ref 6.1–8.1)
eGFR: 105 mL/min/{1.73_m2}

## 2021-04-20 LAB — HEMOGLOBIN A1C
Hgb A1c MFr Bld: 5.9 %{Hb} — ABNORMAL HIGH
Mean Plasma Glucose: 123 mg/dL
eAG (mmol/L): 6.8 mmol/L

## 2021-04-20 LAB — CYCLIC CITRUL PEPTIDE ANTIBODY, IGG: Cyclic Citrullin Peptide Ab: 21 UNITS — ABNORMAL HIGH

## 2021-04-20 LAB — LIPID PANEL
Cholesterol: 209 mg/dL — ABNORMAL HIGH
HDL: 63 mg/dL
LDL Cholesterol (Calc): 131 mg/dL — ABNORMAL HIGH
Non-HDL Cholesterol (Calc): 146 mg/dL — ABNORMAL HIGH
Total CHOL/HDL Ratio: 3.3 (calc)
Triglycerides: 61 mg/dL

## 2021-04-20 LAB — MICROALBUMIN, URINE: Microalb, Ur: 0.4 mg/dL

## 2021-04-20 LAB — TSH: TSH: 3.98 mIU/L

## 2021-04-23 ENCOUNTER — Telehealth: Payer: 59 | Admitting: Physician Assistant

## 2021-04-23 DIAGNOSIS — R3989 Other symptoms and signs involving the genitourinary system: Secondary | ICD-10-CM | POA: Diagnosis not present

## 2021-04-23 MED ORDER — CEPHALEXIN 500 MG PO CAPS: 500.0000 mg | ORAL_CAPSULE | Freq: Two times a day (BID) | ORAL | 0 refills | Status: DC

## 2021-04-23 NOTE — Progress Notes (Signed)

## 2021-05-01 DIAGNOSIS — F411 Generalized anxiety disorder: Secondary | ICD-10-CM | POA: Diagnosis not present

## 2021-05-04 ENCOUNTER — Other Ambulatory Visit (HOSPITAL_COMMUNITY): Payer: Self-pay

## 2021-05-04 DIAGNOSIS — F331 Major depressive disorder, recurrent, moderate: Secondary | ICD-10-CM | POA: Diagnosis not present

## 2021-05-04 DIAGNOSIS — F411 Generalized anxiety disorder: Secondary | ICD-10-CM | POA: Diagnosis not present

## 2021-05-04 DIAGNOSIS — F4312 Post-traumatic stress disorder, chronic: Secondary | ICD-10-CM | POA: Diagnosis not present

## 2021-05-04 MED ORDER — BUPROPION HCL ER (XL) 300 MG PO TB24
ORAL_TABLET | ORAL | 0 refills | Status: DC
  Filled 2021-05-04: qty 90, 90d supply, fill #0

## 2021-05-04 MED ORDER — AMITRIPTYLINE HCL 50 MG PO TABS
ORAL_TABLET | ORAL | 0 refills | Status: DC
  Filled 2021-05-04: qty 90, 90d supply, fill #0

## 2021-05-09 DIAGNOSIS — F411 Generalized anxiety disorder: Secondary | ICD-10-CM | POA: Diagnosis not present

## 2021-05-10 DIAGNOSIS — Z08 Encounter for follow-up examination after completed treatment for malignant neoplasm: Secondary | ICD-10-CM | POA: Diagnosis not present

## 2021-05-10 DIAGNOSIS — Z8509 Personal history of malignant neoplasm of other digestive organs: Secondary | ICD-10-CM | POA: Diagnosis not present

## 2021-05-10 DIAGNOSIS — Z79899 Other long term (current) drug therapy: Secondary | ICD-10-CM | POA: Diagnosis not present

## 2021-05-10 DIAGNOSIS — Z9049 Acquired absence of other specified parts of digestive tract: Secondary | ICD-10-CM | POA: Diagnosis not present

## 2021-05-11 ENCOUNTER — Other Ambulatory Visit (HOSPITAL_COMMUNITY): Payer: Self-pay

## 2021-05-15 DIAGNOSIS — F411 Generalized anxiety disorder: Secondary | ICD-10-CM | POA: Diagnosis not present

## 2021-05-31 DIAGNOSIS — F411 Generalized anxiety disorder: Secondary | ICD-10-CM | POA: Diagnosis not present

## 2021-06-07 DIAGNOSIS — F411 Generalized anxiety disorder: Secondary | ICD-10-CM | POA: Diagnosis not present

## 2021-06-14 DIAGNOSIS — F411 Generalized anxiety disorder: Secondary | ICD-10-CM | POA: Diagnosis not present

## 2021-06-21 DIAGNOSIS — F411 Generalized anxiety disorder: Secondary | ICD-10-CM | POA: Diagnosis not present

## 2021-06-30 DIAGNOSIS — F411 Generalized anxiety disorder: Secondary | ICD-10-CM | POA: Diagnosis not present

## 2021-07-06 DIAGNOSIS — F411 Generalized anxiety disorder: Secondary | ICD-10-CM | POA: Diagnosis not present

## 2021-07-11 ENCOUNTER — Other Ambulatory Visit (HOSPITAL_COMMUNITY): Payer: Self-pay

## 2021-07-16 ENCOUNTER — Other Ambulatory Visit (HOSPITAL_COMMUNITY): Payer: Self-pay

## 2021-07-16 MED ORDER — SUTAB 1479-225-188 MG PO TABS
ORAL_TABLET | ORAL | 0 refills | Status: AC
  Filled 2021-07-16: qty 24, 1d supply, fill #0

## 2021-07-18 DIAGNOSIS — C49A2 Gastrointestinal stromal tumor of stomach: Secondary | ICD-10-CM | POA: Diagnosis not present

## 2021-07-19 DIAGNOSIS — Z800 Family history of malignant neoplasm of digestive organs: Secondary | ICD-10-CM | POA: Diagnosis not present

## 2021-07-19 DIAGNOSIS — F411 Generalized anxiety disorder: Secondary | ICD-10-CM | POA: Diagnosis not present

## 2021-07-19 DIAGNOSIS — Z803 Family history of malignant neoplasm of breast: Secondary | ICD-10-CM | POA: Diagnosis not present

## 2021-07-19 DIAGNOSIS — C49A0 Gastrointestinal stromal tumor, unspecified site: Secondary | ICD-10-CM | POA: Diagnosis not present

## 2021-07-20 ENCOUNTER — Other Ambulatory Visit (HOSPITAL_COMMUNITY): Payer: Self-pay

## 2021-07-20 DIAGNOSIS — F4312 Post-traumatic stress disorder, chronic: Secondary | ICD-10-CM | POA: Diagnosis not present

## 2021-07-20 DIAGNOSIS — F411 Generalized anxiety disorder: Secondary | ICD-10-CM | POA: Diagnosis not present

## 2021-07-20 DIAGNOSIS — F331 Major depressive disorder, recurrent, moderate: Secondary | ICD-10-CM | POA: Diagnosis not present

## 2021-07-20 MED ORDER — AMITRIPTYLINE HCL 50 MG PO TABS
ORAL_TABLET | ORAL | 0 refills | Status: DC
  Filled 2021-07-20 – 2021-08-11 (×2): qty 90, 90d supply, fill #0

## 2021-07-20 MED ORDER — BUPROPION HCL ER (XL) 300 MG PO TB24
ORAL_TABLET | ORAL | 0 refills | Status: DC
  Filled 2021-07-20 – 2021-08-11 (×2): qty 90, 90d supply, fill #0

## 2021-07-27 DIAGNOSIS — F411 Generalized anxiety disorder: Secondary | ICD-10-CM | POA: Diagnosis not present

## 2021-08-02 DIAGNOSIS — F411 Generalized anxiety disorder: Secondary | ICD-10-CM | POA: Diagnosis not present

## 2021-08-13 ENCOUNTER — Other Ambulatory Visit (HOSPITAL_COMMUNITY): Payer: Self-pay

## 2021-08-17 DIAGNOSIS — F411 Generalized anxiety disorder: Secondary | ICD-10-CM | POA: Diagnosis not present

## 2021-08-17 DIAGNOSIS — R87810 Cervical high risk human papillomavirus (HPV) DNA test positive: Secondary | ICD-10-CM | POA: Diagnosis not present

## 2021-08-17 DIAGNOSIS — Z124 Encounter for screening for malignant neoplasm of cervix: Secondary | ICD-10-CM | POA: Diagnosis not present

## 2021-08-17 DIAGNOSIS — Z1231 Encounter for screening mammogram for malignant neoplasm of breast: Secondary | ICD-10-CM | POA: Diagnosis not present

## 2021-08-17 DIAGNOSIS — Z01419 Encounter for gynecological examination (general) (routine) without abnormal findings: Secondary | ICD-10-CM | POA: Diagnosis not present

## 2021-08-22 ENCOUNTER — Encounter (INDEPENDENT_AMBULATORY_CARE_PROVIDER_SITE_OTHER): Payer: Self-pay

## 2021-08-24 DIAGNOSIS — F411 Generalized anxiety disorder: Secondary | ICD-10-CM | POA: Diagnosis not present

## 2021-08-29 ENCOUNTER — Encounter (INDEPENDENT_AMBULATORY_CARE_PROVIDER_SITE_OTHER): Payer: Self-pay

## 2021-08-30 DIAGNOSIS — F411 Generalized anxiety disorder: Secondary | ICD-10-CM | POA: Diagnosis not present

## 2021-09-07 DIAGNOSIS — F411 Generalized anxiety disorder: Secondary | ICD-10-CM | POA: Diagnosis not present

## 2021-09-11 DIAGNOSIS — R87612 Low grade squamous intraepithelial lesion on cytologic smear of cervix (LGSIL): Secondary | ICD-10-CM | POA: Diagnosis not present

## 2021-09-20 DIAGNOSIS — F411 Generalized anxiety disorder: Secondary | ICD-10-CM | POA: Diagnosis not present

## 2021-09-28 DIAGNOSIS — F411 Generalized anxiety disorder: Secondary | ICD-10-CM | POA: Diagnosis not present

## 2021-10-04 ENCOUNTER — Other Ambulatory Visit (HOSPITAL_COMMUNITY): Payer: Self-pay

## 2021-10-04 MED ORDER — PEG 3350-KCL-NA BICARB-NACL 420 G PO SOLR
ORAL | 0 refills | Status: AC
Start: 2021-10-04 — End: ?
  Filled 2021-10-04: qty 4000, 1d supply, fill #0

## 2021-10-05 DIAGNOSIS — F411 Generalized anxiety disorder: Secondary | ICD-10-CM | POA: Diagnosis not present

## 2021-10-10 DIAGNOSIS — F411 Generalized anxiety disorder: Secondary | ICD-10-CM | POA: Diagnosis not present

## 2021-10-17 DIAGNOSIS — F411 Generalized anxiety disorder: Secondary | ICD-10-CM | POA: Diagnosis not present

## 2021-10-26 ENCOUNTER — Other Ambulatory Visit (HOSPITAL_COMMUNITY): Payer: Self-pay

## 2021-10-26 DIAGNOSIS — F331 Major depressive disorder, recurrent, moderate: Secondary | ICD-10-CM | POA: Diagnosis not present

## 2021-10-26 DIAGNOSIS — F4312 Post-traumatic stress disorder, chronic: Secondary | ICD-10-CM | POA: Diagnosis not present

## 2021-10-26 DIAGNOSIS — F411 Generalized anxiety disorder: Secondary | ICD-10-CM | POA: Diagnosis not present

## 2021-10-26 MED ORDER — AMITRIPTYLINE HCL 50 MG PO TABS
50.0000 mg | ORAL_TABLET | Freq: Every day | ORAL | 0 refills | Status: DC
  Filled 2021-10-26: qty 90, 90d supply, fill #0

## 2021-10-26 MED ORDER — BUPROPION HCL ER (XL) 300 MG PO TB24
300.0000 mg | ORAL_TABLET | Freq: Every morning | ORAL | 0 refills | Status: DC
  Filled 2021-10-26: qty 90, 90d supply, fill #0

## 2021-10-27 ENCOUNTER — Other Ambulatory Visit (HOSPITAL_COMMUNITY): Payer: Self-pay

## 2021-11-01 DIAGNOSIS — F411 Generalized anxiety disorder: Secondary | ICD-10-CM | POA: Diagnosis not present

## 2021-11-08 DIAGNOSIS — F411 Generalized anxiety disorder: Secondary | ICD-10-CM | POA: Diagnosis not present

## 2021-11-15 DIAGNOSIS — F411 Generalized anxiety disorder: Secondary | ICD-10-CM | POA: Diagnosis not present

## 2021-11-28 DIAGNOSIS — F411 Generalized anxiety disorder: Secondary | ICD-10-CM | POA: Diagnosis not present

## 2021-12-14 DIAGNOSIS — F411 Generalized anxiety disorder: Secondary | ICD-10-CM | POA: Diagnosis not present

## 2021-12-20 DIAGNOSIS — F411 Generalized anxiety disorder: Secondary | ICD-10-CM | POA: Diagnosis not present

## 2021-12-21 LAB — HM DIABETES EYE EXAM

## 2021-12-24 ENCOUNTER — Other Ambulatory Visit (HOSPITAL_COMMUNITY): Payer: Self-pay

## 2021-12-24 ENCOUNTER — Other Ambulatory Visit: Payer: Self-pay | Admitting: Internal Medicine

## 2021-12-24 DIAGNOSIS — L509 Urticaria, unspecified: Secondary | ICD-10-CM

## 2021-12-24 MED ORDER — ALPRAZOLAM 0.5 MG PO TABS
0.5000 mg | ORAL_TABLET | Freq: Three times a day (TID) | ORAL | 3 refills | Status: AC | PRN
  Filled 2021-12-24 – 2021-12-26 (×2): qty 90, 30d supply, fill #0
  Filled 2022-03-05: qty 90, 30d supply, fill #1
  Filled 2022-04-17: qty 90, 30d supply, fill #2
  Filled 2022-06-04: qty 90, 30d supply, fill #3

## 2021-12-24 MED ORDER — ERGOCALCIFEROL 1.25 MG (50000 UT) PO CAPS
50000.0000 [IU] | ORAL_CAPSULE | ORAL | 3 refills | Status: AC
  Filled 2021-12-24 – 2021-12-26 (×2): qty 12, 84d supply, fill #0
  Filled 2022-03-21: qty 12, 84d supply, fill #1
  Filled 2022-04-17: qty 12, 84d supply, fill #2

## 2021-12-24 MED ORDER — EPINEPHRINE 0.3 MG/0.3ML IJ SOAJ
0.3000 mg | INTRAMUSCULAR | 2 refills | Status: AC | PRN
  Filled 2021-12-24 – 2021-12-26 (×2): qty 2, 30d supply, fill #0
  Filled 2022-06-04: qty 2, 30d supply, fill #1

## 2021-12-25 ENCOUNTER — Encounter (HOSPITAL_COMMUNITY): Payer: Self-pay | Admitting: Pharmacist

## 2021-12-25 ENCOUNTER — Other Ambulatory Visit (HOSPITAL_COMMUNITY): Payer: Self-pay

## 2021-12-26 ENCOUNTER — Other Ambulatory Visit: Payer: Self-pay

## 2021-12-26 ENCOUNTER — Other Ambulatory Visit (HOSPITAL_COMMUNITY): Payer: Self-pay

## 2021-12-27 DIAGNOSIS — F411 Generalized anxiety disorder: Secondary | ICD-10-CM | POA: Diagnosis not present

## 2021-12-28 ENCOUNTER — Encounter: Payer: Self-pay | Admitting: Internal Medicine

## 2021-12-28 DIAGNOSIS — K648 Other hemorrhoids: Secondary | ICD-10-CM | POA: Diagnosis not present

## 2021-12-28 DIAGNOSIS — Z86010 Personal history of colonic polyps: Secondary | ICD-10-CM | POA: Diagnosis not present

## 2021-12-28 DIAGNOSIS — Z09 Encounter for follow-up examination after completed treatment for conditions other than malignant neoplasm: Secondary | ICD-10-CM | POA: Diagnosis not present

## 2021-12-28 LAB — HM COLONOSCOPY

## 2022-01-03 DIAGNOSIS — F411 Generalized anxiety disorder: Secondary | ICD-10-CM | POA: Diagnosis not present

## 2022-01-09 DIAGNOSIS — Z79899 Other long term (current) drug therapy: Secondary | ICD-10-CM | POA: Diagnosis not present

## 2022-01-09 DIAGNOSIS — Z8509 Personal history of malignant neoplasm of other digestive organs: Secondary | ICD-10-CM | POA: Diagnosis not present

## 2022-01-09 DIAGNOSIS — Z903 Acquired absence of stomach [part of]: Secondary | ICD-10-CM | POA: Diagnosis not present

## 2022-01-09 DIAGNOSIS — Z08 Encounter for follow-up examination after completed treatment for malignant neoplasm: Secondary | ICD-10-CM | POA: Diagnosis not present

## 2022-01-09 DIAGNOSIS — C49A0 Gastrointestinal stromal tumor, unspecified site: Secondary | ICD-10-CM | POA: Diagnosis not present

## 2022-01-18 ENCOUNTER — Other Ambulatory Visit (HOSPITAL_COMMUNITY): Payer: Self-pay

## 2022-01-18 ENCOUNTER — Other Ambulatory Visit: Payer: Self-pay

## 2022-01-18 DIAGNOSIS — F4312 Post-traumatic stress disorder, chronic: Secondary | ICD-10-CM | POA: Diagnosis not present

## 2022-01-18 DIAGNOSIS — F411 Generalized anxiety disorder: Secondary | ICD-10-CM | POA: Diagnosis not present

## 2022-01-18 DIAGNOSIS — F331 Major depressive disorder, recurrent, moderate: Secondary | ICD-10-CM | POA: Diagnosis not present

## 2022-01-18 MED ORDER — AMITRIPTYLINE HCL 50 MG PO TABS
50.0000 mg | ORAL_TABLET | Freq: Every evening | ORAL | 0 refills | Status: DC
  Filled 2022-03-21: qty 90, 90d supply, fill #0

## 2022-01-18 MED ORDER — AMITRIPTYLINE HCL 25 MG PO TABS
25.0000 mg | ORAL_TABLET | Freq: Every evening | ORAL | 1 refills | Status: DC
  Filled 2022-01-18: qty 90, 90d supply, fill #0
  Filled 2022-04-17: qty 90, 90d supply, fill #1

## 2022-01-18 MED ORDER — BUPROPION HCL ER (XL) 300 MG PO TB24
300.0000 mg | ORAL_TABLET | Freq: Every morning | ORAL | 0 refills | Status: DC
  Filled 2022-01-18: qty 90, 90d supply, fill #0

## 2022-01-21 ENCOUNTER — Other Ambulatory Visit: Payer: Self-pay

## 2022-01-21 ENCOUNTER — Encounter: Payer: Self-pay | Admitting: Internal Medicine

## 2022-01-21 DIAGNOSIS — H5213 Myopia, bilateral: Secondary | ICD-10-CM | POA: Diagnosis not present

## 2022-01-24 DIAGNOSIS — F411 Generalized anxiety disorder: Secondary | ICD-10-CM | POA: Diagnosis not present

## 2022-01-29 ENCOUNTER — Encounter: Payer: Self-pay | Admitting: Internal Medicine

## 2022-01-30 ENCOUNTER — Telehealth: Payer: Commercial Managed Care - PPO | Admitting: Physician Assistant

## 2022-01-30 DIAGNOSIS — R3989 Other symptoms and signs involving the genitourinary system: Secondary | ICD-10-CM

## 2022-01-30 MED ORDER — CEPHALEXIN 500 MG PO CAPS: 500.0000 mg | ORAL_CAPSULE | Freq: Two times a day (BID) | ORAL | 0 refills | Status: AC

## 2022-01-30 NOTE — Progress Notes (Signed)
I have spent 5 minutes in review of e-visit questionnaire, review and updating patient chart, medical decision making and response to patient.   Malli Falotico Cody Marquinn Meschke, PA-C    

## 2022-01-30 NOTE — Progress Notes (Signed)

## 2022-01-31 ENCOUNTER — Other Ambulatory Visit (HOSPITAL_COMMUNITY): Payer: Self-pay

## 2022-01-31 DIAGNOSIS — F411 Generalized anxiety disorder: Secondary | ICD-10-CM | POA: Diagnosis not present

## 2022-02-05 ENCOUNTER — Telehealth: Payer: Commercial Managed Care - PPO | Admitting: Physician Assistant

## 2022-02-05 DIAGNOSIS — B379 Candidiasis, unspecified: Secondary | ICD-10-CM

## 2022-02-05 MED ORDER — FLUCONAZOLE 150 MG PO TABS: ORAL_TABLET | ORAL | 0 refills | Status: DC

## 2022-02-05 NOTE — Progress Notes (Signed)

## 2022-02-05 NOTE — Progress Notes (Signed)
I have spent 5 minutes in review of e-visit questionnaire, review and updating patient chart, medical decision making and response to patient.   Dudley Cooley Cody Donnabelle Blanchard, PA-C    

## 2022-02-07 DIAGNOSIS — F411 Generalized anxiety disorder: Secondary | ICD-10-CM | POA: Diagnosis not present

## 2022-02-12 DIAGNOSIS — F411 Generalized anxiety disorder: Secondary | ICD-10-CM | POA: Diagnosis not present

## 2022-02-14 DIAGNOSIS — R0683 Snoring: Secondary | ICD-10-CM | POA: Diagnosis not present

## 2022-02-14 DIAGNOSIS — Z1589 Genetic susceptibility to other disease: Secondary | ICD-10-CM | POA: Diagnosis not present

## 2022-02-14 DIAGNOSIS — E559 Vitamin D deficiency, unspecified: Secondary | ICD-10-CM | POA: Diagnosis not present

## 2022-02-14 DIAGNOSIS — E042 Nontoxic multinodular goiter: Secondary | ICD-10-CM | POA: Diagnosis not present

## 2022-02-21 DIAGNOSIS — F411 Generalized anxiety disorder: Secondary | ICD-10-CM | POA: Diagnosis not present

## 2022-02-26 DIAGNOSIS — M25512 Pain in left shoulder: Secondary | ICD-10-CM | POA: Diagnosis not present

## 2022-02-26 DIAGNOSIS — M7502 Adhesive capsulitis of left shoulder: Secondary | ICD-10-CM | POA: Diagnosis not present

## 2022-02-26 DIAGNOSIS — G8929 Other chronic pain: Secondary | ICD-10-CM | POA: Diagnosis not present

## 2022-02-28 DIAGNOSIS — F411 Generalized anxiety disorder: Secondary | ICD-10-CM | POA: Diagnosis not present

## 2022-03-04 ENCOUNTER — Other Ambulatory Visit: Payer: Self-pay

## 2022-03-04 ENCOUNTER — Ambulatory Visit: Payer: Commercial Managed Care - PPO | Attending: Orthopedic Surgery

## 2022-03-04 DIAGNOSIS — M25612 Stiffness of left shoulder, not elsewhere classified: Secondary | ICD-10-CM

## 2022-03-04 DIAGNOSIS — G8929 Other chronic pain: Secondary | ICD-10-CM | POA: Diagnosis not present

## 2022-03-04 DIAGNOSIS — M25512 Pain in left shoulder: Secondary | ICD-10-CM | POA: Diagnosis not present

## 2022-03-04 NOTE — Therapy (Signed)
OUTPATIENT PHYSICAL THERAPY SHOULDER EVALUATION   Patient Name: Jasmine Martinez MRN: [id number] DOB:12-15-1970, 52 y.o., female Today's Date: 03/04/2022  END OF SESSION:  PT End of Session - 03/04/22 0859     Visit Number 1    Number of Visits 16    Date for PT Re-Evaluation 05/03/22    PT Start Time 0904    PT Stop Time 0944    PT Time Calculation (min) 40 min    Activity Tolerance Patient tolerated treatment well    Behavior During Therapy [facility] for tasks assessed/performed             Past Medical History:  Diagnosis Date   Anxiety    Depression    Diabetes mellitus    Gallbladder problem    Gastrointestinal stromal tumor (GIST) ([place])    removed 2016   HTN (hypertension)    no meds now   Hyperlipidemia    Obesity    Parotid mass    right   PMDD (premenstrual dysphoric disorder) 01/02/2016   Vitamin D deficiency    Past Surgical History:  Procedure Laterality Date   APPENDECTOMY     CHOLECYSTECTOMY     EUS N/A 03/23/2014   Procedure: ESOPHAGEAL ENDOSCOPIC ULTRASOUND (EUS) RADIAL;  Surgeon: Arta Silence, MD;  Location: [facility];  Service: Endoscopy;  Laterality: N/A;   FINE NEEDLE ASPIRATION N/A 03/23/2014   Procedure: FINE NEEDLE ASPIRATION (FNA) LINEAR;  Surgeon: Arta Silence, MD;  Location: [facility];  Service: Endoscopy;  Laterality: N/A;   PAROTIDECTOMY Right 04/02/2017   Procedure: RIGHT SUPERFICIAL PAROTIDECTOMY;  Surgeon: Jodi Marble, MD;  Location: [facility];  Service: ENT;  Laterality: Right;   PARTIAL GASTRECTOMY     Stromal tumor   TONSILLECTOMY     Patient Active Problem List   Diagnosis Date Noted   Back pain 02/26/2021   Family history of early CAD 12/19/2020   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   Irritable bowel syndrome with diarrhea 01/24/2017   Other hyperlipidemia 12/31/2016   Class 3 obesity without serious comorbidity with body mass index (BMI) of 40.0 to 44.9 in adult  03/18/2016   Hyperlipidemia 03/11/2016   Class 3 obesity with serious comorbidity and body mass index (BMI) of 40.0 to 44.9 in adult 03/11/2016   Depression 02/05/2016   Class 2 obesity with serious comorbidity and body mass index (BMI) of 39.0 to 39.9 in adult 02/05/2016   Shortness of breath    Daytime hypersomnolence 11/22/2014   HTN (hypertension) 11/15/2014   Dyspnea 11/01/2014   Chronic diarrhea 05/06/2014   Vitamin D deficiency 05/06/2014   Gastrointestinal stromal tumor (GIST) of stomach ([place]) 05/06/2014   Anxiety state 05/06/2014   Diabetes mellitus ([place]) 05/07/2011    PCP: Elby Showers, MD  REFERRING PROVIDER: Lita Mains, MD  REFERRING DIAG:Diabetic frozen shoulder (Left)  THERAPY DIAG:  Stiffness of left shoulder, not elsewhere classified  Chronic left shoulder pain  Rationale for Evaluation and Treatment: Rehabilitation  ONSET DATE: summer 2023  SUBJECTIVE:  SUBJECTIVE STATEMENT: Patient reports that her left shoulder started bothering sometime last summer. She had experienced these same symptoms previously in her right shoulder, but this is the first time on her left shoulder. She has noticed that it has been getting worse since it first began. She had tried massage therapy previously and this helped some, but it was painful. She is right hand dominant.   PERTINENT HISTORY: HTN, DM, anxiety, depression, GI tumor, and obesity  PAIN:  Are you having pain? Yes: NPRS scale: 10/10 Pain location: left shoulder Pain description: sore, tight,  Aggravating factors: moving her arm, hitting her arm, reaching, sleeping Relieving factors: ice and rest  PRECAUTIONS: None  WEIGHT BEARING RESTRICTIONS: No  FALLS:  Has patient fallen in last 6 months? No  LIVING ENVIRONMENT: Lives with:  lives alone Lives in: House/apartment  OCCUPATION: IT for [employer]  PLOF: Independent  PATIENT GOALS: sleep longer (<2 hours currently), reduced pain, be able to put up her hair easily, and improved mobility  NEXT MD VISIT: about 3 months   OBJECTIVE:   PATIENT SURVEYS:  FOTO 44.53  COGNITION: Overall cognitive status: Within functional limits for tasks assessed     SENSATION: Patient reports no numbness or tingling  POSTURE: Forward head and rounded shoulders   UPPER EXTREMITY ROM:   Active ROM Right eval Left eval  Shoulder flexion 142 98/ 110 (PROM); limited by pain and tightness  Shoulder extension    Shoulder abduction 154 50/ 70 (PROM); sharp pain  Shoulder adduction    Shoulder internal rotation To T8 To gluteals; limited by pain  Shoulder external rotation To T2 To C7; painful   Elbow flexion    Elbow extension    Wrist flexion    Wrist extension    Wrist ulnar deviation    Wrist radial deviation    Wrist pronation    Wrist supination    (Blank rows = not tested)  UPPER EXTREMITY MMT:  MMT Right eval Left eval  Shoulder flexion 4-/5 3+/5; painful   Shoulder extension    Shoulder abduction 4/5   Shoulder adduction    Shoulder internal rotation 4+/5 3/5; limited by pain  Shoulder external rotation 4+/5 4-/5   Middle trapezius    Lower trapezius    Elbow flexion    Elbow extension    Wrist flexion    Wrist extension    Wrist ulnar deviation    Wrist radial deviation    Wrist pronation    Wrist supination    Grip strength (lbs) 40 25  (Blank rows = not tested)  JOINT MOBILITY TESTING:  Glenohumeral: hypomobile and painful   PALPATION:  TTP: left biceps and biceps tendon, infraspinatus, upper trapezius, and scapular stabilizers   TODAY'S TREATMENT:  DATE:  Modalities  Date:  Vaso: Shoulder, 34 degrees;  low pressure, 10 mins, Pain  PATIENT EDUCATION: Education details: POC, healing, anatomy, prognosis, and goals for therapy Person educated: Patient Education method: Explanation Education comprehension: verbalized understanding  HOME EXERCISE PROGRAM:   ASSESSMENT:  CLINICAL IMPRESSION: Patient is a 52 y.o. female who was seen today for physical therapy evaluation and treatment for left shoulder adhesive capsulitis. She presented with high pain severity and irritability with left shoulder active and passive range of motion along with manual muscle testing reproducing her familiar pain. Recommended that she continue with skilled physical therapy to address her impairments to return to her prior level of function.  OBJECTIVE IMPAIRMENTS: decreased activity tolerance, decreased ROM, decreased strength, hypomobility, impaired flexibility, impaired tone, impaired UE functional use, postural dysfunction, and pain.   ACTIVITY LIMITATIONS: carrying, lifting, sleeping, bed mobility, bathing, toileting, dressing, reach over head, and hygiene/grooming  PARTICIPATION LIMITATIONS: meal prep, cleaning, laundry, driving, shopping, and community activity  PERSONAL FACTORS: Past/current experiences, Time since onset of injury/illness/exacerbation, and 3+ comorbidities: HTN, DM, anxiety, depression, GI tumor, and obesity  are also affecting patient's functional outcome.   REHAB POTENTIAL: Fair    CLINICAL DECISION MAKING: Evolving/moderate complexity  EVALUATION COMPLEXITY: Moderate   GOALS: Goals reviewed with patient? Yes  SHORT TERM GOALS: Target date: 04/01/22  Patient will be independent with her initial HEP. Baseline: Goal status: INITIAL  2.  Patient will be able to demonstrate at least 110 degrees of active left shoulder flexion for improved function reaching overhead. Baseline:  Goal status: INITIAL  3.  Patient will be able to demonstrate at least 90 degrees of active left shoulder  abduction for improved function reaching. Baseline:  Goal status: INITIAL  4.  Patient will be able to complete her daily activities without her familiar pain exceeding 8/10. Baseline:  Goal status: INITIAL  5.  Patient will be able to carry at least 5 pounds in her left hand without being limited by her familiar shoulder pain Baseline:  Goal status: INITIAL  LONG TERM GOALS: Target date: 04/29/22  Patient will be independent with her advanced HEP. Baseline:  Goal status: INITIAL  2.  Patient will be able to demonstrate at least 130 degrees of active left shoulder flexion for improved function reaching overhead. Baseline:  Goal status: INITIAL  3.  Patient will be able to demonstrate at least 110 degrees of active left shoulder abduction for improved function reaching. Baseline:  Goal status: INITIAL  4.  Patient will be able to carry at least 10 pounds in her left hand without being limited by her familiar left shoulder pain for improved function with her daily activities. Baseline:  Goal status: INITIAL  5.  Patient will be able to complete her daily activities without her familiar pain exceeding 6/10. Baseline:  Goal status: INITIAL  PLAN:  PT FREQUENCY: 2x/week  PT DURATION: 8 weeks  PLANNED INTERVENTIONS: Therapeutic exercises, Therapeutic activity, Neuromuscular re-education, Patient/Family education, Self Care, Joint mobilization, Dry Needling, Electrical stimulation, Cryotherapy, Moist heat, Vasopneumatic device, Ultrasound, Manual therapy, and Re-evaluation  PLAN FOR NEXT SESSION: pulleys, AAROM, PROM, and modalities as needed   Darlin Coco, PT 03/04/2022, 3:57 PM

## 2022-03-05 ENCOUNTER — Other Ambulatory Visit: Payer: Self-pay

## 2022-03-06 ENCOUNTER — Ambulatory Visit: Payer: Commercial Managed Care - PPO

## 2022-03-07 DIAGNOSIS — F411 Generalized anxiety disorder: Secondary | ICD-10-CM | POA: Diagnosis not present

## 2022-03-11 ENCOUNTER — Ambulatory Visit: Payer: Commercial Managed Care - PPO

## 2022-03-11 DIAGNOSIS — M25512 Pain in left shoulder: Secondary | ICD-10-CM | POA: Diagnosis not present

## 2022-03-11 DIAGNOSIS — G8929 Other chronic pain: Secondary | ICD-10-CM

## 2022-03-11 DIAGNOSIS — R87810 Cervical high risk human papillomavirus (HPV) DNA test positive: Secondary | ICD-10-CM | POA: Diagnosis not present

## 2022-03-11 DIAGNOSIS — M25612 Stiffness of left shoulder, not elsewhere classified: Secondary | ICD-10-CM

## 2022-03-11 DIAGNOSIS — N870 Mild cervical dysplasia: Secondary | ICD-10-CM | POA: Diagnosis not present

## 2022-03-11 NOTE — Therapy (Signed)
OUTPATIENT PHYSICAL THERAPY SHOULDER TREATMENT   Patient Name: Jasmine Martinez MRN: [id number] DOB:1970/10/27, 52 y.o., female Today's Date: 03/11/2022  END OF SESSION:  PT End of Session - 03/11/22 1129     Visit Number 2    Number of Visits 16    Date for PT Re-Evaluation 05/03/22    PT Start Time 1115    PT Stop Time 1200    PT Time Calculation (min) 45 min    Activity Tolerance Patient tolerated treatment well    Behavior During Therapy [facility] for tasks assessed/performed              Past Medical History:  Diagnosis Date   Anxiety    Depression    Diabetes mellitus    Gallbladder problem    Gastrointestinal stromal tumor (GIST) ([place])    removed 2016   HTN (hypertension)    no meds now   Hyperlipidemia    Obesity    Parotid mass    right   PMDD (premenstrual dysphoric disorder) 01/02/2016   Vitamin D deficiency    Past Surgical History:  Procedure Laterality Date   APPENDECTOMY     CHOLECYSTECTOMY     EUS N/A 03/23/2014   Procedure: ESOPHAGEAL ENDOSCOPIC ULTRASOUND (EUS) RADIAL;  Surgeon: Arta Silence, MD;  Location: [facility];  Service: Endoscopy;  Laterality: N/A;   FINE NEEDLE ASPIRATION N/A 03/23/2014   Procedure: FINE NEEDLE ASPIRATION (FNA) LINEAR;  Surgeon: Arta Silence, MD;  Location: [facility];  Service: Endoscopy;  Laterality: N/A;   PAROTIDECTOMY Right 04/02/2017   Procedure: RIGHT SUPERFICIAL PAROTIDECTOMY;  Surgeon: Jodi Marble, MD;  Location: [place];  Service: ENT;  Laterality: Right;   PARTIAL GASTRECTOMY     Stromal tumor   TONSILLECTOMY     Patient Active Problem List   Diagnosis Date Noted   Back pain 02/26/2021   Family history of early CAD 12/19/2020   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   Irritable bowel syndrome with diarrhea 01/24/2017   Other hyperlipidemia 12/31/2016   Class 3 obesity without serious comorbidity with body mass index (BMI) of 40.0 to 44.9 in adult  03/18/2016   Hyperlipidemia 03/11/2016   Class 3 obesity with serious comorbidity and body mass index (BMI) of 40.0 to 44.9 in adult 03/11/2016   Depression 02/05/2016   Class 2 obesity with serious comorbidity and body mass index (BMI) of 39.0 to 39.9 in adult 02/05/2016   Shortness of breath    Daytime hypersomnolence 11/22/2014   HTN (hypertension) 11/15/2014   Dyspnea 11/01/2014   Chronic diarrhea 05/06/2014   Vitamin D deficiency 05/06/2014   Gastrointestinal stromal tumor (GIST) of stomach ([place]) 05/06/2014   Anxiety state 05/06/2014   Diabetes mellitus ([place]) 05/07/2011    PCP: Elby Showers, MD  REFERRING PROVIDER: Lita Mains, MD  REFERRING DIAG:Diabetic frozen shoulder (Left)  THERAPY DIAG:  Stiffness of left shoulder, not elsewhere classified  Chronic left shoulder pain  Rationale for Evaluation and Treatment: Rehabilitation  ONSET DATE: summer 2023  SUBJECTIVE:  SUBJECTIVE STATEMENT: Patient reports that her shoulder feels alright, but is hurting some.   PERTINENT HISTORY: HTN, DM, anxiety, depression, GI tumor, and obesity  PAIN:  Are you having pain? Yes: NPRS scale: 4-5/10 Pain location: left shoulder Pain description: sore, tight,  Aggravating factors: moving her arm, hitting her arm, reaching, sleeping Relieving factors: ice and rest  PRECAUTIONS: None  WEIGHT BEARING RESTRICTIONS: No  FALLS:  Has patient fallen in last 6 months? 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  PATIENT GOALS: sleep longer (<2 hours currently), reduced pain, be able to put up her hair easily, and improved mobility  NEXT MD VISIT: about 3 months   OBJECTIVE: all objective measures were assessed at her  initial evaluation on 03/04/22 unless otherwise noted  PATIENT SURVEYS:  FOTO 44.53  COGNITION: Overall cognitive status: Within functional limits for tasks assessed     SENSATION: Patient reports no numbness or tingling  POSTURE: Forward head and rounded shoulders   UPPER EXTREMITY ROM:   Active ROM Right eval Left eval  Shoulder flexion 142 98/ 110 (PROM); limited by pain and tightness  Shoulder extension    Shoulder abduction 154 50/ 70 (PROM); sharp pain  Shoulder adduction    Shoulder internal rotation To T8 To gluteals; limited by pain  Shoulder external rotation To T2 To C7; painful   Elbow flexion    Elbow extension    Wrist flexion    Wrist extension    Wrist ulnar deviation    Wrist radial deviation    Wrist pronation    Wrist supination    (Blank rows = not tested)  UPPER EXTREMITY MMT:  MMT Right eval Left eval  Shoulder flexion 4-/5 3+/5; painful   Shoulder extension    Shoulder abduction 4/5   Shoulder adduction    Shoulder internal rotation 4+/5 3/5; limited by pain  Shoulder external rotation 4+/5 4-/5   Middle trapezius    Lower trapezius    Elbow flexion    Elbow extension    Wrist flexion    Wrist extension    Wrist ulnar deviation    Wrist radial deviation    Wrist pronation    Wrist supination    Grip strength (lbs) 40 25  (Blank rows = not tested)  JOINT MOBILITY TESTING:  Glenohumeral: hypomobile and painful   PALPATION:  TTP: left biceps and biceps tendon, infraspinatus, upper trapezius, and scapular stabilizers   TODAY'S TREATMENT:                                                                                                                                         DATE:  2/26 EXERCISE LOG  Exercise Repetitions and Resistance Comments  Pulleys 5 minutes   AA ER with cane Limited due to pain    Isometric shoulder ER  20 reps w/ 5 second hold    Isometric shoulder flexion 25 reps w/ 5  seconds hold   UBE X8 minutes @ 90 RPM   Resisted row  Green t-band x 2 minutes   Wall ladder  8 reps Max: 21#    Blank cell = exercise not performed today  Modalities  Date:  Vaso: Shoulder, 34 degrees; low pressure, 10 mins, Pain  PATIENT EDUCATION: Education details: POC, healing, anatomy, prognosis, and goals for therapy Person educated: Patient Education method: Explanation Education comprehension: verbalized understanding  HOME EXERCISE PROGRAM:   ASSESSMENT:  CLINICAL IMPRESSION: Patient was introduced to multiple new interventions for improved shoulder mobility with moderate difficulty and fatigue. She required minimal cueing with these new interventions for proper biomechanics to facilitate proper muscular engagement. She experienced an increase in pain when active assisted external rotation which resulted in this being modified to isometric external rotation. She reported that her shoulder felt good upon the conclusion of treatment. She continues to require skilled physical therapy to address her remaining impairments to return to her prior level of function.   OBJECTIVE IMPAIRMENTS: decreased activity tolerance, decreased ROM, decreased strength, hypomobility, impaired flexibility, impaired tone, impaired UE functional use, postural dysfunction, and pain.   ACTIVITY LIMITATIONS: carrying, lifting, sleeping, bed mobility, bathing, toileting, dressing, reach over head, and hygiene/grooming  PARTICIPATION LIMITATIONS: meal prep, cleaning, laundry, driving, shopping, and community activity  PERSONAL FACTORS: Past/current experiences, Time since onset of injury/illness/exacerbation, and 3+ comorbidities: HTN, DM, anxiety, depression, GI tumor, and obesity  are also affecting patient's functional outcome.   REHAB POTENTIAL: Fair    CLINICAL DECISION MAKING: Evolving/moderate complexity  EVALUATION COMPLEXITY: Moderate   GOALS: Goals reviewed with patient? Yes  SHORT  TERM GOALS: Target date: 04/01/22  Patient will be independent with her initial HEP. Baseline: Goal status: INITIAL  2.  Patient will be able to demonstrate at least 110 degrees of active left shoulder flexion for improved function reaching overhead. Baseline:  Goal status: INITIAL  3.  Patient will be able to demonstrate at least 90 degrees of active left shoulder abduction for improved function reaching. Baseline:  Goal status: INITIAL  4.  Patient will be able to complete her daily activities without her familiar pain exceeding 8/10. Baseline:  Goal status: INITIAL  5.  Patient will be able to carry at least 5 pounds in her left hand without being limited by her familiar shoulder pain Baseline:  Goal status: INITIAL  LONG TERM GOALS: Target date: 04/29/22  Patient will be independent with her advanced HEP. Baseline:  Goal status: INITIAL  2.  Patient will be able to demonstrate at least 130 degrees of active left shoulder flexion for improved function reaching overhead. Baseline:  Goal status: INITIAL  3.  Patient will be able to demonstrate at least 110 degrees of active left shoulder abduction for improved function reaching. Baseline:  Goal status: INITIAL  4.  Patient will be able to carry at least 10 pounds in her left hand without being limited by her familiar left shoulder pain for improved function with her daily activities. Baseline:  Goal status: INITIAL  5.  Patient will be able to complete her daily activities without her familiar pain exceeding 6/10. Baseline:  Goal status: INITIAL  PLAN:  PT FREQUENCY: 2x/week  PT DURATION: 8  weeks  PLANNED INTERVENTIONS: Therapeutic exercises, Therapeutic activity, Neuromuscular re-education, Patient/Family education, Self Care, Joint mobilization, Dry Needling, Electrical stimulation, Cryotherapy, Moist heat, Vasopneumatic device, Ultrasound, Manual therapy, and Re-evaluation  PLAN FOR NEXT SESSION: pulleys, AAROM,  PROM, and modalities as needed   Darlin Coco, PT 03/11/2022, 12:01 PM

## 2022-03-15 DIAGNOSIS — F411 Generalized anxiety disorder: Secondary | ICD-10-CM | POA: Diagnosis not present

## 2022-03-18 ENCOUNTER — Ambulatory Visit: Payer: Commercial Managed Care - PPO | Attending: Orthopedic Surgery

## 2022-03-18 DIAGNOSIS — M25612 Stiffness of left shoulder, not elsewhere classified: Secondary | ICD-10-CM | POA: Diagnosis not present

## 2022-03-18 DIAGNOSIS — G8929 Other chronic pain: Secondary | ICD-10-CM | POA: Diagnosis not present

## 2022-03-18 DIAGNOSIS — M25512 Pain in left shoulder: Secondary | ICD-10-CM | POA: Insufficient documentation

## 2022-03-18 NOTE — Therapy (Addendum)
OUTPATIENT PHYSICAL THERAPY SHOULDER TREATMENT   Patient Name: Jasmine Martinez MRN: [id number] DOB:Feb 09, 1970, 52 y.o., female Today's Date: 03/18/2022  END OF SESSION:  PT End of Session - 03/18/22 1111     Visit Number 3    Number of Visits 16    Date for PT Re-Evaluation 05/03/22    PT Start Time 1115    PT Stop Time 1200    PT Time Calculation (min) 45 min    Activity Tolerance Patient tolerated treatment well    Behavior During Therapy [facility] for tasks assessed/performed              Past Medical History:  Diagnosis Date   Anxiety    Depression    Diabetes mellitus    Gallbladder problem    Gastrointestinal stromal tumor (GIST) ([facility])    removed 2016   HTN (hypertension)    no meds now   Hyperlipidemia    Obesity    Parotid mass    right   PMDD (premenstrual dysphoric disorder) 01/02/2016   Vitamin D deficiency    Past Surgical History:  Procedure Laterality Date   APPENDECTOMY     CHOLECYSTECTOMY     EUS N/A 03/23/2014   Procedure: ESOPHAGEAL ENDOSCOPIC ULTRASOUND (EUS) RADIAL;  Surgeon: Willis Modena, MD;  Location: [facility];  Service: Endoscopy;  Laterality: N/A;   FINE NEEDLE ASPIRATION N/A 03/23/2014   Procedure: FINE NEEDLE ASPIRATION (FNA) LINEAR;  Surgeon: Willis Modena, MD;  Location: [facility];  Service: Endoscopy;  Laterality: N/A;   PAROTIDECTOMY Right 04/02/2017   Procedure: RIGHT SUPERFICIAL PAROTIDECTOMY;  Surgeon: Flo Shanks, MD;  Location: [facility];  Service: ENT;  Laterality: Right;   PARTIAL GASTRECTOMY     Stromal tumor   TONSILLECTOMY     Patient Active Problem List   Diagnosis Date Noted   Back pain 02/26/2021   Family history of early CAD 12/19/2020   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   Irritable bowel syndrome with diarrhea 01/24/2017   Other hyperlipidemia 12/31/2016   Class 3 obesity without serious comorbidity with body mass index (BMI) of 40.0 to 44.9 in adult  03/18/2016   Hyperlipidemia 03/11/2016   Class 3 obesity with serious comorbidity and body mass index (BMI) of 40.0 to 44.9 in adult 03/11/2016   Depression 02/05/2016   Class 2 obesity with serious comorbidity and body mass index (BMI) of 39.0 to 39.9 in adult 02/05/2016   Shortness of breath    Daytime hypersomnolence 11/22/2014   HTN (hypertension) 11/15/2014   Dyspnea 11/01/2014   Chronic diarrhea 05/06/2014   Vitamin D deficiency 05/06/2014   Gastrointestinal stromal tumor (GIST) of stomach ([facility]) 05/06/2014   Anxiety state 05/06/2014   Diabetes mellitus ([facility]) 05/07/2011    PCP: Margaree Mackintosh, MD  REFERRING PROVIDER: Julieanne Cotton, MD  REFERRING DIAG:Diabetic frozen shoulder (Left)  THERAPY DIAG:  Stiffness of left shoulder, not elsewhere classified  Chronic left shoulder pain  Rationale for Evaluation and Treatment: Rehabilitation  ONSET DATE: summer 2023  SUBJECTIVE:  SUBJECTIVE STATEMENT: Patient reports that she felt good after her last appointment. However, the next day was rough. She also had to do a lot of driving which did not help.   PERTINENT HISTORY: HTN, DM, anxiety, depression, GI tumor, and obesity  PAIN:  Are you having pain? Yes: NPRS scale: 5/10 Pain location: left shoulder Pain description: sore, tight,  Aggravating factors: moving her arm, hitting her arm, reaching, sleeping Relieving factors: ice and rest  PRECAUTIONS: None  WEIGHT BEARING RESTRICTIONS: No  FALLS:  Has patient fallen in last 6 months? 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  PATIENT GOALS: sleep longer (<2 hours currently), reduced pain, be able to put up her hair easily, and improved mobility  NEXT MD VISIT:  about 3 months   OBJECTIVE: all objective measures were assessed at her initial evaluation on 03/04/22 unless otherwise noted  PATIENT SURVEYS:  FOTO 44.53  COGNITION: Overall cognitive status: Within functional limits for tasks assessed     SENSATION: Patient reports no numbness or tingling  POSTURE: Forward head and rounded shoulders   UPPER EXTREMITY ROM:   Active ROM Right eval Left eval  Shoulder flexion 142 98/ 110 (PROM); limited by pain and tightness  Shoulder extension    Shoulder abduction 154 50/ 70 (PROM); sharp pain  Shoulder adduction    Shoulder internal rotation To T8 To gluteals; limited by pain  Shoulder external rotation To T2 To C7; painful   Elbow flexion    Elbow extension    Wrist flexion    Wrist extension    Wrist ulnar deviation    Wrist radial deviation    Wrist pronation    Wrist supination    (Blank rows = not tested)  UPPER EXTREMITY MMT:  MMT Right eval Left eval  Shoulder flexion 4-/5 3+/5; painful   Shoulder extension    Shoulder abduction 4/5   Shoulder adduction    Shoulder internal rotation 4+/5 3/5; limited by pain  Shoulder external rotation 4+/5 4-/5   Middle trapezius    Lower trapezius    Elbow flexion    Elbow extension    Wrist flexion    Wrist extension    Wrist ulnar deviation    Wrist radial deviation    Wrist pronation    Wrist supination    Grip strength (lbs) 40 25  (Blank rows = not tested)  JOINT MOBILITY TESTING:  Glenohumeral: hypomobile and painful   PALPATION:  TTP: left biceps and biceps tendon, infraspinatus, upper trapezius, and scapular stabilizers   TODAY'S TREATMENT:                                                                                                                                         DATE:  3/4 EXERCISE LOG  Exercise Repetitions and Resistance Comments  Pulleys 4 minutes   L shoulder isometric ABD  3 minutes w/ 5 second hold   L  shoulder isometric ADD 3 minutes w/ 5 second hold   Ball roll out  2.5 minutes  For AA shoulder flexion  Doorway stretch  3 x 30 seconds   Wall ladder 2.5 minutes Max 21#    Blank cell = exercise not performed today  Modalities  Date:  Unattended Estim: Shoulder, IFC @ 80-150 Hz w/ 40% scan, 15 mins, Pain Vaso: Shoulder, 34 degrees; low pressure, 15 mins, Pain                                   2/26 EXERCISE LOG  Exercise Repetitions and Resistance Comments  Pulleys 5 minutes   AA ER with cane Limited due to pain    Isometric shoulder ER  20 reps w/ 5 second hold    Isometric shoulder flexion 25 reps w/ 5 seconds hold   UBE X8 minutes @ 90 RPM   Resisted row  Green t-band x 2 minutes   Wall ladder  8 reps Max: 21#    Blank cell = exercise not performed today  Modalities  Date:  Vaso: Shoulder, 34 degrees; low pressure, 10 mins, Pain  PATIENT EDUCATION: Education details: POC, healing, anatomy, prognosis, and goals for therapy Person educated: Patient Education method: Explanation Education comprehension: verbalized understanding  HOME EXERCISE PROGRAM:   ASSESSMENT:  CLINICAL IMPRESSION: Treatment focused on familiar interventions for improved shoulder mobility. She required cueing with today's isometric interventions for proper biomechanics to facilitate rotator cuff engagement. She experienced a mild increase in discomfort with the wall ladder. However, this was able to be reduced with modalities upon the conclusion of treatment. She reported that her shoulder felt better at the conclusion of treatment. She continues to require skilled physical therapy to address her remaining impairments to return to her prior level of function.   PHYSICAL THERAPY DISCHARGE SUMMARY  Visits from Start of Care: 3  Current functional level related to goals / functional outcomes: Patient was unable to meet her goals for skilled physical therapy as she did not complete her recommended plan  of care.    Remaining deficits: Pain, left shoulder AROM and muscular strength   Education / Equipment: HEP    Patient agrees to discharge. Patient goals were not met. Patient is being discharged due to not returning since the last visit.  Candi Leash, PT, DPT    OBJECTIVE IMPAIRMENTS: decreased activity tolerance, decreased ROM, decreased strength, hypomobility, impaired flexibility, impaired tone, impaired UE functional use, postural dysfunction, and pain.   ACTIVITY LIMITATIONS: carrying, lifting, sleeping, bed mobility, bathing, toileting, dressing, reach over head, and hygiene/grooming  PARTICIPATION LIMITATIONS: meal prep, cleaning, laundry, driving, shopping, and community activity  PERSONAL FACTORS: Past/current experiences, Time since onset of injury/illness/exacerbation, and 3+ comorbidities: HTN, DM, anxiety, depression, GI tumor, and obesity  are also affecting patient's functional outcome.   REHAB POTENTIAL: Fair    CLINICAL DECISION MAKING: Evolving/moderate complexity  EVALUATION COMPLEXITY: Moderate   GOALS: Goals reviewed with patient? Yes  SHORT TERM GOALS: Target date: 04/01/22  Patient will be independent with her initial HEP. Baseline: Goal status: INITIAL  2.  Patient will be able to demonstrate at least 110 degrees of active left shoulder flexion for improved function reaching overhead. Baseline:  Goal status: INITIAL  3.  Patient will be able to demonstrate at least 90 degrees of active left shoulder abduction for improved function reaching. Baseline:  Goal status: INITIAL  4.  Patient will be able to complete her daily activities without her familiar pain exceeding 8/10. Baseline:  Goal status: INITIAL  5.  Patient will be able to carry at least 5 pounds in her left hand without being limited by her familiar shoulder pain Baseline:  Goal status: INITIAL  LONG TERM GOALS: Target date: 04/29/22  Patient will be independent with her advanced  HEP. Baseline:  Goal status: INITIAL  2.  Patient will be able to demonstrate at least 130 degrees of active left shoulder flexion for improved function reaching overhead. Baseline:  Goal status: INITIAL  3.  Patient will be able to demonstrate at least 110 degrees of active left shoulder abduction for improved function reaching. Baseline:  Goal status: INITIAL  4.  Patient will be able to carry at least 10 pounds in her left hand without being limited by her familiar left shoulder pain for improved function with her daily activities. Baseline:  Goal status: INITIAL  5.  Patient will be able to complete her daily activities without her familiar pain exceeding 6/10. Baseline:  Goal status: INITIAL  PLAN:  PT FREQUENCY: 2x/week  PT DURATION: 8 weeks  PLANNED INTERVENTIONS: Therapeutic exercises, Therapeutic activity, Neuromuscular re-education, Patient/Family education, Self Care, Joint mobilization, Dry Needling, Electrical stimulation, Cryotherapy, Moist heat, Vasopneumatic device, Ultrasound, Manual therapy, and Re-evaluation  PLAN FOR NEXT SESSION: pulleys, AAROM, PROM, and modalities as needed   Granville Lewis, PT 03/18/2022, 12:47 PM

## 2022-03-19 ENCOUNTER — Ambulatory Visit: Payer: Commercial Managed Care - PPO | Admitting: Internal Medicine

## 2022-03-21 ENCOUNTER — Other Ambulatory Visit: Payer: Self-pay

## 2022-03-21 ENCOUNTER — Other Ambulatory Visit (HOSPITAL_COMMUNITY): Payer: Self-pay

## 2022-03-21 ENCOUNTER — Other Ambulatory Visit: Payer: Self-pay | Admitting: Nurse Practitioner

## 2022-03-21 DIAGNOSIS — M545 Low back pain, unspecified: Secondary | ICD-10-CM

## 2022-03-21 DIAGNOSIS — F411 Generalized anxiety disorder: Secondary | ICD-10-CM | POA: Diagnosis not present

## 2022-03-21 MED ORDER — CYCLOBENZAPRINE HCL 10 MG PO TABS
10.0000 mg | ORAL_TABLET | Freq: Three times a day (TID) | ORAL | 0 refills | Status: DC | PRN
  Filled 2022-03-21: qty 30, 10d supply, fill #0

## 2022-03-22 ENCOUNTER — Other Ambulatory Visit (HOSPITAL_COMMUNITY): Payer: Self-pay

## 2022-03-22 MED ORDER — BUPROPION HCL ER (XL) 300 MG PO TB24
300.0000 mg | ORAL_TABLET | Freq: Every morning | ORAL | 0 refills | Status: DC
  Filled 2022-03-22 – 2022-04-17 (×2): qty 90, 90d supply, fill #0

## 2022-03-25 ENCOUNTER — Ambulatory Visit: Payer: Commercial Managed Care - PPO

## 2022-03-30 DIAGNOSIS — F411 Generalized anxiety disorder: Secondary | ICD-10-CM | POA: Diagnosis not present

## 2022-04-01 DIAGNOSIS — T781XXD Other adverse food reactions, not elsewhere classified, subsequent encounter: Secondary | ICD-10-CM | POA: Diagnosis not present

## 2022-04-01 DIAGNOSIS — Z91030 Bee allergy status: Secondary | ICD-10-CM | POA: Diagnosis not present

## 2022-04-01 DIAGNOSIS — E042 Nontoxic multinodular goiter: Secondary | ICD-10-CM | POA: Diagnosis not present

## 2022-04-01 DIAGNOSIS — J3089 Other allergic rhinitis: Secondary | ICD-10-CM | POA: Diagnosis not present

## 2022-04-01 DIAGNOSIS — J300 Vasomotor rhinitis: Secondary | ICD-10-CM | POA: Diagnosis not present

## 2022-04-03 DIAGNOSIS — F411 Generalized anxiety disorder: Secondary | ICD-10-CM | POA: Diagnosis not present

## 2022-04-08 ENCOUNTER — Other Ambulatory Visit: Payer: Commercial Managed Care - PPO

## 2022-04-08 DIAGNOSIS — F419 Anxiety disorder, unspecified: Secondary | ICD-10-CM

## 2022-04-08 DIAGNOSIS — I10 Essential (primary) hypertension: Secondary | ICD-10-CM | POA: Diagnosis not present

## 2022-04-08 DIAGNOSIS — E1169 Type 2 diabetes mellitus with other specified complication: Secondary | ICD-10-CM | POA: Diagnosis not present

## 2022-04-08 DIAGNOSIS — R5383 Other fatigue: Secondary | ICD-10-CM

## 2022-04-08 DIAGNOSIS — E785 Hyperlipidemia, unspecified: Secondary | ICD-10-CM | POA: Diagnosis not present

## 2022-04-08 DIAGNOSIS — E119 Type 2 diabetes mellitus without complications: Secondary | ICD-10-CM

## 2022-04-08 DIAGNOSIS — F32A Depression, unspecified: Secondary | ICD-10-CM | POA: Diagnosis not present

## 2022-04-09 LAB — CBC WITH DIFFERENTIAL/PLATELET
Absolute Monocytes: 435 cells/uL (ref 200–950)
Basophils Absolute: 32 cells/uL (ref 0–200)
Basophils Relative: 0.4 %
Eosinophils Absolute: 111 cells/uL (ref 15–500)
Eosinophils Relative: 1.4 %
HCT: 39.9 % (ref 35.0–45.0)
Hemoglobin: 13.7 g/dL (ref 11.7–15.5)
Lymphs Abs: 1730 cells/uL (ref 850–3900)
MCH: 30.6 pg (ref 27.0–33.0)
MCHC: 34.3 g/dL (ref 32.0–36.0)
MCV: 89.1 fL (ref 80.0–100.0)
MPV: 10.3 fL (ref 7.5–12.5)
Monocytes Relative: 5.5 %
Neutro Abs: 5593 cells/uL (ref 1500–7800)
Neutrophils Relative %: 70.8 %
Platelets: 355 10*3/uL (ref 140–400)
RBC: 4.48 10*6/uL (ref 3.80–5.10)
RDW: 11.8 % (ref 11.0–15.0)
Total Lymphocyte: 21.9 %
WBC: 7.9 10*3/uL (ref 3.8–10.8)

## 2022-04-09 LAB — COMPLETE METABOLIC PANEL WITH GFR
AG Ratio: 1.3 (calc) (ref 1.0–2.5)
ALT: 15 U/L (ref 6–29)
AST: 12 U/L (ref 10–35)
Albumin: 4.3 g/dL (ref 3.6–5.1)
Alkaline phosphatase (APISO): 138 U/L — ABNORMAL HIGH (ref 31–125)
BUN: 13 mg/dL (ref 7–25)
CO2: 27 mmol/L (ref 20–32)
Calcium: 9.6 mg/dL (ref 8.6–10.2)
Chloride: 103 mmol/L (ref 98–110)
Creat: 0.65 mg/dL (ref 0.50–0.99)
Globulin: 3.2 g/dL (calc) (ref 1.9–3.7)
Glucose, Bld: 122 mg/dL — ABNORMAL HIGH (ref 65–99)
Potassium: 4.4 mmol/L (ref 3.5–5.3)
Sodium: 140 mmol/L (ref 135–146)
Total Bilirubin: 0.6 mg/dL (ref 0.2–1.2)
Total Protein: 7.5 g/dL (ref 6.1–8.1)
eGFR: 108 mL/min/{1.73_m2} (ref >=60)

## 2022-04-09 LAB — MICROALBUMIN / CREATININE URINE RATIO
Creatinine, Urine: 27 mg/dL (ref 20–275)
Microalb, Ur: <0.2 mg/dL

## 2022-04-09 LAB — LIPID PANEL
Cholesterol: 188 mg/dL (ref <200)
HDL: 54 mg/dL (ref >=50)
LDL Cholesterol (Calc): 108 mg/dL (calc) — ABNORMAL HIGH
Non-HDL Cholesterol (Calc): 134 mg/dL (calc) — ABNORMAL HIGH (ref <130)
Total CHOL/HDL Ratio: 3.5 (calc) (ref <5.0)
Triglycerides: 146 mg/dL (ref <150)

## 2022-04-09 LAB — HEMOGLOBIN A1C
Hgb A1c MFr Bld: 6.2 % of total Hgb — ABNORMAL HIGH (ref <5.7)
Mean Plasma Glucose: 131 mg/dL
eAG (mmol/L): 7.3 mmol/L

## 2022-04-09 LAB — TSH: TSH: 3.76 mIU/L

## 2022-04-15 ENCOUNTER — Other Ambulatory Visit: Payer: 59

## 2022-04-15 NOTE — Progress Notes (Signed)
Patient Care Team: Margaree Mackintosh, MD as PCP - General (Internal Medicine) Rollene Rotunda, MD as Consulting Physician (Cardiology)  Visit Date: 04/22/22  Subjective:    Patient ID: Jasmine Martinez , Female   DOB: [birth year], 52 y.o.    MRN: [id number]   52 y.o. Female presents today for annual comprehensive physical exam. Patient has a past medical history of anxiety and depression, diabetes mellitus, gastrointestinal stromal tumor removed 2016, hypertension, hyperlipidemia, obesity, parotid mass right, Vitamin D deficiency.  Hx of mild elevation of alk phos this year and last. She says consultants at [facility] are aware. Hgb AIC stable at 6.2%.Kidney functions are normal.  History of anxiety and depression treated with Xanax 0.5 mg three times daily as needed, Elavil 50 mg daily at bedtime, Wellbutrin XL 300 mg daily in the morning.  History of impaired glucose tolerance treated with insulin. HGBA1c at 6.2% on 04/08/22. Has been compliant with insulin but has had trouble with eating habits, not exercising. Fasting glucose at 122 on 04/08/22.  History of Vitamin D deficiency treated with Drisdol 50,000 units once weekly.   Alk phos elevated at 138 on 04/08/22. Status post cholecystectomy.  Abnormal pap smear in 2022.  Mammogram last completed 05/18/20. Recommended repeat in 2023.  Colonoscopy last completed 12/28/21. Results showed internal hemorrhoids. Examination otherwise normal. Recommended repeat in 2028.  Past Medical History:  Diagnosis Date   Anxiety    Depression    Diabetes mellitus    Gallbladder problem    Gastrointestinal stromal tumor (GIST)    removed 2016   HTN (hypertension)    no meds now   Hyperlipidemia    Obesity    Parotid mass    right   PMDD (premenstrual dysphoric disorder) 01/02/2016   Vitamin D deficiency      Family History  Problem Relation Age of Onset   Healthy Mother    Hypertension Mother    Hyperlipidemia Mother    Alcoholism Mother     Anxiety disorder Mother    Drug abuse Mother    Healthy Father    Colon cancer Paternal Grandfather 80       colon    Social Hx: Resides alone.Son is in school at [employer] [place]. Nonsmoker. She is a widow. Works from home for [employer]. Nonsmoker. Does not consume alcohol.    Review of Systems  Constitutional:  Negative for chills, fever, malaise/fatigue and weight loss.  HENT:  Negative for hearing loss, sinus pain and sore throat.   Respiratory:  Negative for cough, hemoptysis and shortness of breath.   Cardiovascular:  Negative for chest pain, palpitations, leg swelling and PND.  Gastrointestinal:  Negative for abdominal pain, constipation, diarrhea, heartburn, nausea and vomiting.  Genitourinary:  Negative for dysuria, frequency and urgency.  Musculoskeletal:  Negative for back pain, myalgias and neck pain.  Skin:  Negative for itching and rash.  Neurological:  Negative for dizziness, tingling, seizures and headaches.  Endo/Heme/Allergies:  Negative for polydipsia.  Psychiatric/Behavioral:  Negative for depression. The patient is not nervous/anxious.         Objective:   Vitals: BP 128/72   Pulse 77   Temp 97.8 F (36.6 C) (Tympanic)   Ht  (1.676 m)   Wt 255 lb 6.4 oz (115.8 kg)   SpO2 99%   BMI 41.22 kg/m    Physical Exam Vitals and nursing note reviewed.  Constitutional:      General: She is not in acute distress.  Appearance: Normal appearance. She is not ill-appearing or toxic-appearing.  HENT:     Head: Normocephalic and atraumatic.     Right Ear: Hearing, tympanic membrane, ear canal and external ear normal.     Left Ear: Hearing, tympanic membrane, ear canal and external ear normal.     Mouth/Throat:     Pharynx: Oropharynx is clear.  Eyes:     Extraocular Movements: Extraocular movements intact.     Pupils: Pupils are equal, round, and reactive to light.  Neck:     Thyroid: No thyroid mass, thyromegaly or thyroid tenderness.      Vascular: No carotid bruit.  Cardiovascular:     Rate and Rhythm: Normal rate and regular rhythm. No extrasystoles are present.    Pulses:          Dorsalis pedis pulses are 1+ on the right side and 1+ on the left side.     Heart sounds: Normal heart sounds. No murmur heard.    No friction rub. No gallop.  Pulmonary:     Effort: Pulmonary effort is normal.     Breath sounds: Normal breath sounds. No decreased breath sounds, wheezing, rhonchi or rales.  Chest:     Chest wall: No mass.  Abdominal:     Palpations: Abdomen is soft. There is no hepatomegaly, splenomegaly or mass.     Tenderness: There is no abdominal tenderness.     Hernia: No hernia is present.  Musculoskeletal:     Cervical back: Normal range of motion.     Right lower leg: No edema.     Left lower leg: No edema.  Lymphadenopathy:     Cervical: No cervical adenopathy.     Upper Body:     Right upper body: No supraclavicular adenopathy.     Left upper body: No supraclavicular adenopathy.  Skin:    General: Skin is warm and dry.     Findings: Erythema and rash present. Rash is macular.     Comments: 50 cent coin-sized area of macular erythema with raised center in medial lumbar area.  Neurological:     General: No focal deficit present.     Mental Status: She is alert and oriented to person, place, and time. Mental status is at baseline.     Sensory: Sensation is intact.     Motor: Motor function is intact. No weakness.     Deep Tendon Reflexes: Reflexes are normal and symmetric.  Psychiatric:        Attention and Perception: Attention normal.        Mood and Affect: Mood normal.        Speech: Speech normal.        Behavior: Behavior normal.        Thought Content: Thought content normal.        Cognition and Memory: Cognition normal.        Judgment: Judgment normal.       Results:   Studies obtained and personally reviewed by me:  Mammogram last completed 05/18/20. Recommended repeat in  2023.  Colonoscopy last completed 12/28/21. Results showed internal hemorrhoids. Examination otherwise normal. Recommended repeat in 2028.   Labs:       Component Value Date/Time   NA 140 04/08/2022 0934   NA 141 04/15/2019 1358   K 4.4 04/08/2022 0934   CL 103 04/08/2022 0934   CO2 27 04/08/2022 0934   GLUCOSE 122 (H) 04/08/2022 0934   BUN 13 04/08/2022 0934   BUN  10 04/15/2019 1358   CREATININE 0.65 04/08/2022 0934   CALCIUM 9.6 04/08/2022 0934   PROT 7.5 04/08/2022 0934   PROT 7.3 04/15/2019 1358   ALBUMIN 4.7 04/15/2019 1358   AST 12 04/08/2022 0934   ALT 15 04/08/2022 0934   ALKPHOS 73 04/15/2019 1358   BILITOT 0.6 04/08/2022 0934   BILITOT 0.4 04/15/2019 1358   GFRNONAA 108 04/18/2020 1005   GFRAA 125 04/18/2020 1005     Lab Results  Component Value Date   WBC 7.9 04/08/2022   HGB 13.7 04/08/2022   HCT 39.9 04/08/2022   MCV 89.1 04/08/2022   PLT 355 04/08/2022    Lab Results  Component Value Date   CHOL 188 04/08/2022   HDL 54 04/08/2022   LDLCALC 108 (H) 04/08/2022   TRIG 146 04/08/2022   CHOLHDL 3.5 04/08/2022    Lab Results  Component Value Date   HGBA1C 6.2 (H) 04/08/2022     Lab Results  Component Value Date   TSH 3.76 04/08/2022      Assessment & Plan:   Macular erythematous macular skin area in  medial lumbar region: prescribed cefprozil 500 mg twice daily for 5 days. ? impetigo  Anxiety and depression: stable with Xanax 0.5 mg three times daily as needed, Elavil 50 mg daily at bedtime, Wellbutrin XL 300 mg daily in the morning.  Diabetes mellitus: treated with insulin. HGBA1c at 6.2% on 04/08/22. Has been compliant with insulin but has had trouble with eating habits, not exercising. Endocrinology referral- [facility]  Vitamin D deficiency: treated with Drisdol 50,000 units once weekly.   Elevation of alk phos: alk phos elevated at 138 on 04/08/22. Status post cholecystectomy. [facility] staff is aware and follows this  Abnormal pap  smear in 2022. 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  Mammogram: last completed 05/18/20. Recommended repeat in 2023.  Colonoscopy: last completed 12/28/21. Results showed internal hemorrhoids. Examination otherwise normal. Recommended repeat in 2028.  Vaccine counseling: UTD on flu, tetanus vaccines. Discussed pneumococcal 20 vaccine.  Hx of GIST tumor followed at [facility]  Thyroid nodules followed by ultrasound. Needs yearly for one solid nodule noted by Radiology  I,Alexander Ruley,acting as a scribe for Margaree Mackintosh, MD.,have documented all relevant documentation on the behalf of Margaree Mackintosh, MD,as directed by  Margaree Mackintosh, MD while in the presence of Margaree Mackintosh, MD.   I, Margaree Mackintosh, MD, have reviewed all documentation for this visit. The documentation on 05/05/22 for the exam, diagnosis, procedures, and orders are all accurate and complete.

## 2022-04-16 DIAGNOSIS — F411 Generalized anxiety disorder: Secondary | ICD-10-CM | POA: Diagnosis not present

## 2022-04-17 ENCOUNTER — Other Ambulatory Visit: Payer: Self-pay

## 2022-04-17 ENCOUNTER — Other Ambulatory Visit (HOSPITAL_COMMUNITY): Payer: Self-pay

## 2022-04-22 ENCOUNTER — Ambulatory Visit (INDEPENDENT_AMBULATORY_CARE_PROVIDER_SITE_OTHER): Payer: Commercial Managed Care - PPO | Admitting: Internal Medicine

## 2022-04-22 ENCOUNTER — Other Ambulatory Visit (HOSPITAL_COMMUNITY): Payer: Self-pay

## 2022-04-22 ENCOUNTER — Other Ambulatory Visit: Payer: Self-pay

## 2022-04-22 ENCOUNTER — Encounter: Payer: Self-pay | Admitting: Internal Medicine

## 2022-04-22 VITALS — BP 128/72 | HR 77 | Temp 97.8°F | Ht 66.0 in | Wt 255.4 lb

## 2022-04-22 DIAGNOSIS — E1169 Type 2 diabetes mellitus with other specified complication: Secondary | ICD-10-CM

## 2022-04-22 DIAGNOSIS — Z6841 Body Mass Index (BMI) 40.0 and over, adult: Secondary | ICD-10-CM

## 2022-04-22 DIAGNOSIS — E042 Nontoxic multinodular goiter: Secondary | ICD-10-CM

## 2022-04-22 DIAGNOSIS — C49A2 Gastrointestinal stromal tumor of stomach: Secondary | ICD-10-CM | POA: Diagnosis not present

## 2022-04-22 DIAGNOSIS — R768 Other specified abnormal immunological findings in serum: Secondary | ICD-10-CM | POA: Diagnosis not present

## 2022-04-22 DIAGNOSIS — E119 Type 2 diabetes mellitus without complications: Secondary | ICD-10-CM

## 2022-04-22 DIAGNOSIS — F419 Anxiety disorder, unspecified: Secondary | ICD-10-CM

## 2022-04-22 DIAGNOSIS — N870 Mild cervical dysplasia: Secondary | ICD-10-CM | POA: Diagnosis not present

## 2022-04-22 DIAGNOSIS — E041 Nontoxic single thyroid nodule: Secondary | ICD-10-CM

## 2022-04-22 DIAGNOSIS — F5104 Psychophysiologic insomnia: Secondary | ICD-10-CM | POA: Diagnosis not present

## 2022-04-22 DIAGNOSIS — L0100 Impetigo, unspecified: Secondary | ICD-10-CM

## 2022-04-22 DIAGNOSIS — Z0000 Encounter for general adult medical examination without abnormal findings: Secondary | ICD-10-CM

## 2022-04-22 DIAGNOSIS — M2550 Pain in unspecified joint: Secondary | ICD-10-CM

## 2022-04-22 DIAGNOSIS — F32A Depression, unspecified: Secondary | ICD-10-CM

## 2022-04-22 DIAGNOSIS — M19011 Primary osteoarthritis, right shoulder: Secondary | ICD-10-CM

## 2022-04-22 DIAGNOSIS — E785 Hyperlipidemia, unspecified: Secondary | ICD-10-CM

## 2022-04-22 DIAGNOSIS — C49A0 Gastrointestinal stromal tumor, unspecified site: Secondary | ICD-10-CM

## 2022-04-22 LAB — POCT URINALYSIS DIPSTICK
Bilirubin, UA: NEGATIVE
Blood, UA: NEGATIVE
Glucose, UA: NEGATIVE
Ketones, UA: NEGATIVE
Leukocytes, UA: NEGATIVE
Nitrite, UA: NEGATIVE
Protein, UA: NEGATIVE
Spec Grav, UA: 1.02 (ref 1.010–1.025)
Urobilinogen, UA: 0.2 E.U./dL
pH, UA: 5 (ref 5.0–8.0)

## 2022-04-22 MED ORDER — CEFPROZIL 500 MG PO TABS
500.0000 mg | ORAL_TABLET | Freq: Two times a day (BID) | ORAL | 0 refills | Status: AC
  Filled 2022-04-22: qty 10, 5d supply, fill #0

## 2022-04-22 NOTE — Patient Instructions (Addendum)
Referral to Endocrinology for weight loss and diabetic control. Has follow up here in one year or as needed. Appears to have small area of cellulitis- possible ? Insect bite mid lower back to be treated with Cefzil for 5 days twice daily.  Please be sure and follow-up with Dr. Henderson Cloud in October 2024.  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  Continue diabetic medications as prescribed.  Continue follow-up at [facility] for GIST tumor.  I do not see a recent mammogram on file.  Last one I see is 2022. Thyroid ultrasound ordered to follow up on solid nodules.

## 2022-04-23 ENCOUNTER — Other Ambulatory Visit: Payer: Self-pay

## 2022-04-23 ENCOUNTER — Other Ambulatory Visit (HOSPITAL_COMMUNITY): Payer: Self-pay

## 2022-04-25 DIAGNOSIS — F411 Generalized anxiety disorder: Secondary | ICD-10-CM | POA: Diagnosis not present

## 2022-04-26 ENCOUNTER — Other Ambulatory Visit (HOSPITAL_COMMUNITY): Payer: Self-pay

## 2022-04-26 DIAGNOSIS — F411 Generalized anxiety disorder: Secondary | ICD-10-CM | POA: Diagnosis not present

## 2022-04-26 DIAGNOSIS — F331 Major depressive disorder, recurrent, moderate: Secondary | ICD-10-CM | POA: Diagnosis not present

## 2022-04-26 DIAGNOSIS — F4312 Post-traumatic stress disorder, chronic: Secondary | ICD-10-CM | POA: Diagnosis not present

## 2022-04-26 MED ORDER — AMITRIPTYLINE HCL 50 MG PO TABS
50.0000 mg | ORAL_TABLET | Freq: Every day | ORAL | 1 refills | Status: AC
  Filled 2022-04-26 – 2022-06-04 (×2): qty 90, 90d supply, fill #0

## 2022-04-26 MED ORDER — BUPROPION HCL ER (XL) 300 MG PO TB24
300.0000 mg | ORAL_TABLET | ORAL | 1 refills | Status: AC
  Filled 2022-04-26 – 2022-06-04 (×2): qty 90, 90d supply, fill #0

## 2022-04-28 NOTE — Progress Notes (Deleted)
New Patient Note  RE: Jasmine Martinez MRN: [id number] DOB: January 09, 1971 Date of Office Visit: 04/29/2022  Consult requested by: Margaree Mackintosh, MD Primary care provider: Margaree Mackintosh, MD  Chief Complaint: No chief complaint on file.  History of Present Illness: I had the pleasure of seeing Jasmine Martinez for initial evaluation at the [facility] on 04/28/2022. She is a 52 y.o. female, who is referred here by Margaree Mackintosh, MD for the evaluation of shellfish allergy.  She reports food allergy to ***. The reaction occurred at the age of ***, after she ate *** amount of ***. Symptoms started within *** and was in the form of *** hives, swelling, wheezing, abdominal pain, diarrhea, vomiting. ***Denies any associated cofactors such as exertion, infection, NSAID use, or alcohol consumption. The symptoms lasted for ***. She was evaluated in ED and received ***. Since this episode, she does *** not report other accidental exposures to ***. She does *** not have access to epinephrine autoinjector and *** needed to use it.   Past work up includes: ***. Dietary History: patient has been eating other foods including ***milk, ***eggs, ***peanut, ***treenuts, ***sesame, ***shellfish, ***fish, ***soy, ***wheat, ***meats, ***fruits and ***vegetables.  She reports reading labels and avoiding *** in diet completely. She tolerates ***baked egg and baked milk products.   Assessment and Plan: Jasmine Martinez is a 52 y.o. female with: No problem-specific Assessment & Plan notes found for this encounter.  No follow-ups on file.  No orders of the defined types were placed in this encounter.  Lab Orders  No laboratory test(s) ordered today    Other allergy screening: Asthma: {Blank single:19197::"yes","no"} Rhino conjunctivitis: {Blank single:19197::"yes","no"} Food allergy: {Blank single:19197::"yes","no"} Medication allergy: {Blank single:19197::"yes","no"} Hymenoptera allergy:  {Blank single:19197::"yes","no"} Urticaria: {Blank single:19197::"yes","no"} Eczema:{Blank single:19197::"yes","no"} History of recurrent infections suggestive of immunodeficency: {Blank single:19197::"yes","no"}  Diagnostics: Spirometry:  Tracings reviewed. Her effort: {Blank single:19197::"Good reproducible efforts.","It was hard to get consistent efforts and there is a question as to whether this reflects a maximal maneuver.","Poor effort, data can not be interpreted."} FVC: ***L FEV1: ***L, ***% predicted FEV1/FVC ratio: ***% Interpretation: {Blank single:19197::"Spirometry consistent with mild obstructive disease","Spirometry consistent with moderate obstructive disease","Spirometry consistent with severe obstructive disease","Spirometry consistent with possible restrictive disease","Spirometry consistent with mixed obstructive and restrictive disease","Spirometry uninterpretable due to technique","Spirometry consistent with normal pattern","No overt abnormalities noted given today's efforts"}.  Please see scanned spirometry results for details.  Skin Testing: {Blank single:19197::"Select foods","Environmental allergy panel","Environmental allergy panel and select foods","Food allergy panel","None","Deferred due to recent antihistamines use"}. *** Results discussed with patient/family.   Past Medical History: Patient Active Problem List   Diagnosis Date Noted  . Back pain 02/26/2021  . Family history of early CAD 12/19/2020  . 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 Irritable bowel syndrome with diarrhea 01/24/2017  . Other hyperlipidemia 12/31/2016  . Class 3 obesity without serious comorbidity with body mass index (BMI) of 40.0 to 44.9 in adult 03/18/2016  . Hyperlipidemia 03/11/2016  . Class 3 obesity with serious comorbidity and body mass index (BMI) of 40.0 to 44.9 in adult 03/11/2016  . Depression 02/05/2016  . Class 2 obesity with serious comorbidity  and body mass index (BMI) of 39.0 to 39.9 in adult 02/05/2016  . Shortness of breath   . Daytime hypersomnolence 11/22/2014  . HTN (hypertension) 11/15/2014  . Dyspnea 11/01/2014  . Chronic diarrhea 05/06/2014  . Vitamin D deficiency 05/06/2014  . Gastrointestinal stromal tumor (GIST) of stomach 05/06/2014  .  Anxiety state 05/06/2014  . Diabetes mellitus (HCC) 05/07/2011   Past Medical History:  Diagnosis Date  . Anxiety   . Depression   . Diabetes mellitus   . Gallbladder problem   . Gastrointestinal stromal tumor (GIST)    removed 2016  . HTN (hypertension)    no meds now  . Hyperlipidemia   . Obesity   . Parotid mass    right  . PMDD (premenstrual dysphoric disorder) 01/02/2016  . Vitamin D deficiency    Past Surgical History: Past Surgical History:  Procedure Laterality Date  . APPENDECTOMY    . CHOLECYSTECTOMY    . EUS N/A 03/23/2014   Procedure: ESOPHAGEAL ENDOSCOPIC ULTRASOUND (EUS) RADIAL;  Surgeon: Willis Modena, MD;  Location: [facility];  Service: Endoscopy;  Laterality: N/A;  . FINE NEEDLE ASPIRATION N/A 03/23/2014   Procedure: FINE NEEDLE ASPIRATION (FNA) LINEAR;  Surgeon: Willis Modena, MD;  Location: [facility];  Service: Endoscopy;  Laterality: N/A;  . PAROTIDECTOMY Right 04/02/2017   Procedure: RIGHT SUPERFICIAL PAROTIDECTOMY;  Surgeon: Flo Shanks, MD;  Location: [facility];  Service: ENT;  Laterality: Right;  . PARTIAL GASTRECTOMY     Stromal tumor  . TONSILLECTOMY     Medication List:  Current Outpatient Medications  Medication Sig Dispense Refill  . Accu-Chek Softclix Lancets lancets Use as instructed 100 each 1  . ALPRAZolam (XANAX) 0.5 MG tablet Take 1 tablet (0.5 mg total) by mouth 3 (three) times daily as needed for anxiety. 90 tablet 3  . amitriptyline (ELAVIL) 50 MG tablet Take 1 tablet (50 mg total) by mouth at bedtime. 90 tablet 1  . atorvastatin (LIPITOR) 10 MG tablet TAKE 1 TABLET BY MOUTH EVERY DAY 90 tablet 3   . blood glucose meter kit and supplies KIT Dispense based on patient and insurance preference. Use up to four times daily as directed.  Dx: E11.69 1 each 0  . buPROPion (WELLBUTRIN XL) 300 MG 24 hr tablet Take 1 tablet (300 mg total) by mouth every morning. 90 tablet 1  . cefPROZIL (CEFZIL) 500 MG tablet Take 1 tablet (500 mg total) by mouth 2 (two) times daily. 10 tablet 0  . cyclobenzaprine (FLEXERIL) 10 MG tablet Take 1 tablet (10 mg total) by mouth 3 (three) times daily as needed for muscle spasms. (Patient not taking: Reported on 04/22/2022) 30 tablet 0  . EPINEPHrine 0.3 mg/0.3 mL IJ SOAJ injection Inject 0.3 mg into the muscle as needed for anaphylaxis. 2 each 2  . ergocalciferol (DRISDOL) 1.25 MG (50000 UT) capsule Take 1 capsule by mouth once a week. 12 capsule 3  . fluconazole (DIFLUCAN) 150 MG tablet Take 1 tablet PO once. Repeat in 3 days if needed. (Patient not taking: Reported on 03/04/2022) 2 tablet 0  . Glucose Blood (BLOOD GLUCOSE TEST STRIPS) STRP 1 each by In Vitro route 4 (four) times daily. 100 strip 0  . Insulin Pen Needle (BD PEN NEEDLE NANO 2ND GEN) 32G X 4 MM MISC 1 Package by Does not apply route 2 (two) times daily. 100 each 0  . Insulin Pen Needle 32G X 4 MM MISC 1 Device by Does not apply route every morning. 100 each 0  . levonorgestrel (MIRENA) 20 MCG/24HR IUD 1 each by Intrauterine route once.    . polyethylene glycol-electrolytes (NULYTELY) 420 g solution Take as directed. (Patient not taking: Reported on 03/04/2022) 4000 mL 0  . Sodium Sulfate-Mag Sulfate-KCl (SUTAB) [id number] MG TABS Take as directed by office instructions. (Patient not  taking: Reported on 03/04/2022) 24 tablet 0  . VICTOZA 18 MG/3ML SOPN INJECT 1.8 MG UNDER THE SKIN ONCE DAILY 3 mL prn   No current facility-administered medications for this visit.   Allergies: No Active Allergies Social History: Social History   Socioeconomic History  . Marital status: Widowed    Spouse name: Not on file   . Number of children: 1  . Years of education: Not on file  . Highest education level: Not on file  Occupational History  . 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 Smoking status: Never  . Smokeless tobacco: Never  Substance and Sexual Activity  . Alcohol use: No  . Drug use: No    Frequency: 3.0 times per week  . Sexual activity: Yes    Birth control/protection: I.U.D.  Other Topics Concern  . Not on file  Social History Narrative   Lives with husband and son.    Social Determinants of Health   Financial Resource Strain: Not on file  Food Insecurity: Not on file  Transportation Needs: Not on file  Physical Activity: Not on file  Stress: Not on file  Social Connections: Not on file   Lives in a ***. Smoking: *** Occupation: ***  Environmental HistorySurveyor, minerals in the house: Copywriter, advertising in the family room: {Blank single:19197::"yes","no"} Carpet in the bedroom: {Blank single:19197::"yes","no"} Heating: {Blank single:19197::"electric","gas","heat pump"} Cooling: {Blank single:19197::"central","window","heat pump"} Pet: {Blank single:19197::"yes ***","no"}  Family History: Family History  Problem Relation Age of Onset  . Healthy Mother   . Hypertension Mother   . Hyperlipidemia Mother   . Alcoholism Mother   . Anxiety disorder Mother   . Drug abuse Mother   . Healthy Father   . Colon cancer Paternal Grandfather 40       colon   Problem                               Relation Asthma                                   *** Eczema                                *** Food allergy                          *** Allergic rhino conjunctivitis     ***  Review of Systems  Constitutional:  Negative for appetite change, chills, fever and unexpected weight change.  HENT:  Negative for congestion and rhinorrhea.   Eyes:  Negative for itching.  Respiratory:  Negative for cough, chest tightness,  shortness of breath and wheezing.   Cardiovascular:  Negative for chest pain.  Gastrointestinal:  Negative for abdominal pain.  Genitourinary:  Negative for difficulty urinating.  Skin:  Negative for rash.  Neurological:  Negative for headaches.   Objective: There were no vitals taken for this visit. There is no height or weight on file to calculate BMI. Physical Exam Vitals and nursing note reviewed.  Constitutional:      Appearance: Normal appearance. She is well-developed.  HENT:     Head: Normocephalic and atraumatic.     Right Ear: Tympanic membrane and external ear normal.  Left Ear: Tympanic membrane and external ear normal.     Nose: Nose normal.     Mouth/Throat:     Mouth: Mucous membranes are moist.     Pharynx: Oropharynx is clear.  Eyes:     Conjunctiva/sclera: Conjunctivae normal.  Cardiovascular:     Rate and Rhythm: Normal rate and regular rhythm.     Heart sounds: Normal heart sounds. No murmur heard.    No friction rub. No gallop.  Pulmonary:     Effort: Pulmonary effort is normal.     Breath sounds: Normal breath sounds. No wheezing, rhonchi or rales.  Musculoskeletal:     Cervical back: Neck supple.  Skin:    General: Skin is warm.     Findings: No rash.  Neurological:     Mental Status: She is alert and oriented to person, place, and time.  Psychiatric:        Behavior: Behavior normal.  The plan was reviewed with the patient/family, and all questions/concerned were addressed.  It was my pleasure to see Genene today and participate in her care. Please feel free to contact me with any questions or concerns.  Sincerely,  Wyline Mood, DO Allergy & Immunology  [facility] office: [phone] [facility] office: [phone]

## 2022-04-29 ENCOUNTER — Ambulatory Visit: Payer: Commercial Managed Care - PPO | Admitting: Allergy

## 2022-04-30 DIAGNOSIS — R87612 Low grade squamous intraepithelial lesion on cytologic smear of cervix (LGSIL): Secondary | ICD-10-CM | POA: Diagnosis not present

## 2022-05-02 DIAGNOSIS — F411 Generalized anxiety disorder: Secondary | ICD-10-CM | POA: Diagnosis not present

## 2022-05-05 DIAGNOSIS — N870 Mild cervical dysplasia: Secondary | ICD-10-CM | POA: Insufficient documentation

## 2022-05-05 DIAGNOSIS — E042 Nontoxic multinodular goiter: Secondary | ICD-10-CM | POA: Insufficient documentation

## 2022-05-06 ENCOUNTER — Telehealth: Payer: Self-pay

## 2022-05-06 NOTE — Telephone Encounter (Signed)
Spoke with patient to ask where she would like referral for mammogram and she stated she has one every year at [facility] along with her Pap.  She stated she is on top of her health condition and seeing Dr. Carrington Clamp and will ask their office to fax mammogram.

## 2022-05-06 NOTE — Progress Notes (Signed)
Patient Care Team: Margaree Mackintosh, MD as PCP - General (Internal Medicine) Rollene Rotunda, MD as Consulting Physician (Cardiology)  Visit Date: 04/22/22  Subjective:    Patient ID: Jasmine Martinez , Female   DOB: 27-Apr-1970, 52 y.o.    MRN: [id number]   52 y.o. Female presents today for annual comprehensive physical exam. Patient has a past medical history of anxiety and depression, diabetes mellitus, gastrointestinal stromal tumor removed 2016, hypertension, hyperlipidemia, obesity, parotid mass right, Vitamin D deficiency.   Hx of mild elevation of alk phos this year and last. She says consultants at [facility] are aware. Hgb AIC stable at 6.2%.Kidney functions are normal.   History of anxiety and depression treated with Xanax 0.5 mg three times daily as needed, Elavil 50 mg daily at bedtime, Wellbutrin XL 300 mg daily in the morning.   History of impaired glucose tolerance treated with insulin. HGBA1c at 6.2% on 04/08/22. Has been compliant with insulin but has had trouble with eating habits, not exercising. Fasting glucose at 122 on 04/08/22.   History of Vitamin D deficiency treated with Drisdol 50,000 units once weekly.    Alk phos elevated at 138 on 04/08/22. Status post cholecystectomy.   Abnormal pap smear in 2022.   Mammogram last completed 05/18/20. Recommended repeat in 2023.   Colonoscopy last completed 12/28/21. Results showed internal hemorrhoids. Examination otherwise normal. Recommended repeat in 2028.   Past Medical History:  Diagnosis Date   Anxiety    Depression    Diabetes mellitus    Gallbladder problem    Gastrointestinal stromal tumor (GIST)    removed 2016   HTN (hypertension)    no meds now   Hyperlipidemia    Obesity    Parotid mass    right   PMDD (premenstrual dysphoric disorder) 01/02/2016   Vitamin D deficiency      Family History  Problem Relation Age of Onset   Healthy Mother    Hypertension Mother    Hyperlipidemia Mother    Alcoholism  Mother    Anxiety disorder Mother    Drug abuse Mother    Healthy Father    Colon cancer Paternal Grandfather 96       colon    Social Hx: Resides alone.Son is in school at [employer] [place]. Nonsmoker. She is a widow. Works from home for [employer]. Nonsmoker. Does not consume alcohol.       Review of Systems  Constitutional:  Negative for chills, fever, malaise/fatigue and weight loss.  HENT:  Negative for hearing loss, sinus pain and sore throat.   Respiratory:  Negative for cough, hemoptysis and shortness of breath.   Cardiovascular:  Negative for chest pain, palpitations, leg swelling and PND.  Gastrointestinal:  Negative for abdominal pain, constipation, diarrhea, heartburn, nausea and vomiting.  Genitourinary:  Negative for dysuria, frequency and urgency.  Musculoskeletal:  Negative for back pain, myalgias and neck pain.  Skin:  Negative for itching and rash.  Neurological:  Negative for dizziness, tingling, seizures and headaches.  Endo/Heme/Allergies:  Negative for polydipsia.  Psychiatric/Behavioral:  Negative for depression. The patient is not nervous/anxious.        Objective:   Vitals: BP 128/72   Pulse 77   Temp 97.8 F (36.6 C) (Tympanic)   Ht  (1.676 m)   Wt 255 lb 6.4 oz (115.8 kg)   SpO2 99%   BMI 41.22 kg/m    Physical Exam Vitals and nursing note reviewed.  Constitutional:  General: She is not in acute distress.    Appearance: Normal appearance. She is not ill-appearing or toxic-appearing.  HENT:     Head: Normocephalic and atraumatic.     Right Ear: Hearing, tympanic membrane, ear canal and external ear normal.     Left Ear: Hearing, tympanic membrane, ear canal and external ear normal.     Mouth/Throat:     Pharynx: Oropharynx is clear.  Eyes:     Extraocular Movements: Extraocular movements intact.     Pupils: Pupils are equal, round, and reactive to light.  Neck:     Thyroid: No thyroid mass, thyromegaly or thyroid  tenderness.     Vascular: No carotid bruit.  Cardiovascular:     Rate and Rhythm: Normal rate and regular rhythm. No extrasystoles are present.    Pulses:          Dorsalis pedis pulses are 1+ on the right side and 1+ on the left side.     Heart sounds: Normal heart sounds. No murmur heard.    No friction rub. No gallop.  Pulmonary:     Effort: Pulmonary effort is normal.     Breath sounds: Normal breath sounds. No decreased breath sounds, wheezing, rhonchi or rales.  Chest:     Chest wall: No mass.  Abdominal:     Palpations: Abdomen is soft. There is no hepatomegaly, splenomegaly or mass.     Tenderness: There is no abdominal tenderness.     Hernia: No hernia is present.  Musculoskeletal:     Cervical back: Normal range of motion.     Right lower leg: No edema.     Left lower leg: No edema.  Lymphadenopathy:     Cervical: No cervical adenopathy.     Upper Body:     Right upper body: No supraclavicular adenopathy.     Left upper body: No supraclavicular adenopathy.  Skin:    General: Skin is warm and dry.     Findings: Erythema and rash present. Rash is macular.     Comments: 50 cent coin-sized area of macular erythema with raised center in medial lumbar area.  Neurological:     General: No focal deficit present.     Mental Status: She is alert and oriented to person, place, and time. Mental status is at baseline.     Sensory: Sensation is intact.     Motor: Motor function is intact. No weakness.     Deep Tendon Reflexes: Reflexes are normal and symmetric.  Psychiatric:        Attention and Perception: Attention normal.        Mood and Affect: Mood normal.        Speech: Speech normal.        Behavior: Behavior normal.        Thought Content: Thought content normal.        Cognition and Memory: Cognition normal.        Judgment: Judgment normal.    Results:   Studies obtained and personally reviewed by me:  Mammogram last completed 05/18/20. Recommended repeat in  2023.   Colonoscopy last completed 12/28/21. Results showed internal hemorrhoids. Examination otherwise normal. Recommended repeat in 2028.   Labs:       Component Value Date/Time   NA 140 04/08/2022 0934   NA 141 04/15/2019 1358   K 4.4 04/08/2022 0934   CL 103 04/08/2022 0934   CO2 27 04/08/2022 0934   GLUCOSE 122 (H) 04/08/2022 9629  BUN 13 04/08/2022 0934   BUN 10 04/15/2019 1358   CREATININE 0.65 04/08/2022 0934   CALCIUM 9.6 04/08/2022 0934   PROT 7.5 04/08/2022 0934   PROT 7.3 04/15/2019 1358   ALBUMIN 4.7 04/15/2019 1358   AST 12 04/08/2022 0934   ALT 15 04/08/2022 0934   ALKPHOS 73 04/15/2019 1358   BILITOT 0.6 04/08/2022 0934   BILITOT 0.4 04/15/2019 1358   GFRNONAA 108 04/18/2020 1005   GFRAA 125 04/18/2020 1005     Lab Results  Component Value Date   WBC 7.9 04/08/2022   HGB 13.7 04/08/2022   HCT 39.9 04/08/2022   MCV 89.1 04/08/2022   PLT 355 04/08/2022    Lab Results  Component Value Date   CHOL 188 04/08/2022   HDL 54 04/08/2022   LDLCALC 108 (H) 04/08/2022   TRIG 146 04/08/2022   CHOLHDL 3.5 04/08/2022    Lab Results  Component Value Date   HGBA1C 6.2 (H) 04/08/2022     Lab Results  Component Value Date   TSH 3.76 04/08/2022      Assessment & Plan:   Macular erythematous macular skin area in  medial lumbar region: prescribed cefprozil 500 mg twice daily for 5 days. ? impetigo   Anxiety and depression: stable with Xanax 0.5 mg three times daily as needed, Elavil 50 mg daily at bedtime, Wellbutrin XL 300 mg daily in the morning.   Diabetes mellitus: treated with insulin. HGBA1c at 6.2% on 04/08/22. Has been compliant with insulin but has had trouble with eating habits, not exercising. Endocrinology referral- [facility]   Vitamin D deficiency: treated with Drisdol 50,000 units once weekly.    Elevation of alk phos: alk phos elevated at 138 on 04/08/22. Status post cholecystectomy. [facility] staff is aware and follows this   Abnormal  pap smear in 2022. 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   Mammogram: last completed 05/18/20. Recommended repeat in 2023.   Colonoscopy: last completed 12/28/21. Results showed internal hemorrhoids. Examination otherwise normal. Recommended repeat in 2028.   Vaccine counseling: UTD on flu, tetanus vaccines. Discussed pneumococcal 20 vaccine.   Hx of GIST tumor followed at [facility]  Thyroid nodules followed by ultrasound. Needs yearly for one solid nodule noted by Radiology   I,Alexander Ruley,acting as a scribe for Margaree Mackintosh, MD.,have documented all relevant documentation on the behalf of Margaree Mackintosh, MD,as directed by  Margaree Mackintosh, MD while in the presence of Margaree Mackintosh, MD.     I, Margaree Mackintosh, MD, have reviewed all documentation for this visit. The documentation on 05/05/22 for the exam, diagnosis, procedures, and orders are all accurate and complete.

## 2022-05-08 DIAGNOSIS — F411 Generalized anxiety disorder: Secondary | ICD-10-CM | POA: Diagnosis not present

## 2022-05-14 DIAGNOSIS — F411 Generalized anxiety disorder: Secondary | ICD-10-CM | POA: Diagnosis not present

## 2022-05-15 ENCOUNTER — Other Ambulatory Visit: Payer: Self-pay

## 2022-05-15 ENCOUNTER — Other Ambulatory Visit (HOSPITAL_COMMUNITY): Payer: Self-pay

## 2022-05-15 ENCOUNTER — Encounter: Payer: Self-pay | Admitting: Internal Medicine

## 2022-05-15 DIAGNOSIS — E119 Type 2 diabetes mellitus without complications: Secondary | ICD-10-CM

## 2022-05-15 MED ORDER — VICTOZA 18 MG/3ML ~~LOC~~ SOPN
PEN_INJECTOR | SUBCUTANEOUS | 3 refills | Status: DC
  Filled 2022-05-15: qty 9, 30d supply, fill #0
  Filled 2022-06-04: qty 9, 30d supply, fill #1
  Filled 2022-06-04: qty 3, 10d supply, fill #1

## 2022-05-15 NOTE — Telephone Encounter (Signed)
Rx sent to the pharmacy with 3 refills

## 2022-05-16 ENCOUNTER — Other Ambulatory Visit: Payer: Self-pay

## 2022-05-18 ENCOUNTER — Ambulatory Visit (HOSPITAL_BASED_OUTPATIENT_CLINIC_OR_DEPARTMENT_OTHER)
Admission: RE | Admit: 2022-05-18 | Discharge: 2022-05-18 | Disposition: A | Discharge status: Home / Self Care | Payer: Commercial Managed Care - PPO | Source: Ambulatory Visit | Attending: Internal Medicine | Admitting: Internal Medicine

## 2022-05-18 DIAGNOSIS — E041 Nontoxic single thyroid nodule: Secondary | ICD-10-CM | POA: Insufficient documentation

## 2022-05-18 DIAGNOSIS — E042 Nontoxic multinodular goiter: Secondary | ICD-10-CM | POA: Diagnosis not present

## 2022-05-31 DIAGNOSIS — F411 Generalized anxiety disorder: Secondary | ICD-10-CM | POA: Diagnosis not present

## 2022-06-04 ENCOUNTER — Other Ambulatory Visit: Payer: Self-pay | Admitting: Internal Medicine

## 2022-06-04 ENCOUNTER — Other Ambulatory Visit: Payer: Self-pay

## 2022-06-04 ENCOUNTER — Other Ambulatory Visit (HOSPITAL_COMMUNITY): Payer: Self-pay

## 2022-06-04 ENCOUNTER — Telehealth: Payer: Self-pay | Admitting: Internal Medicine

## 2022-06-04 ENCOUNTER — Other Ambulatory Visit (INDEPENDENT_AMBULATORY_CARE_PROVIDER_SITE_OTHER): Payer: Self-pay | Admitting: Family Medicine

## 2022-06-04 DIAGNOSIS — M545 Low back pain, unspecified: Secondary | ICD-10-CM

## 2022-06-04 DIAGNOSIS — E119 Type 2 diabetes mellitus without complications: Secondary | ICD-10-CM

## 2022-06-04 DIAGNOSIS — E669 Obesity, unspecified: Secondary | ICD-10-CM

## 2022-06-04 DIAGNOSIS — E1169 Type 2 diabetes mellitus with other specified complication: Secondary | ICD-10-CM

## 2022-06-04 MED ORDER — CYCLOBENZAPRINE HCL 10 MG PO TABS
10.0000 mg | ORAL_TABLET | Freq: Three times a day (TID) | ORAL | 1 refills | Status: DC | PRN
  Filled 2022-06-04: qty 90, 30d supply, fill #0

## 2022-06-04 MED ORDER — INSULIN PEN NEEDLE 32G X 4 MM MISC
1.0000 | Freq: Every morning | 0 refills | Status: AC
  Filled 2022-06-04: qty 100, 90d supply, fill #0

## 2022-06-04 NOTE — Telephone Encounter (Signed)
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 Her new insurance does not cover Victoza, so she was wanting to come in and talk to you about changing this medication to something her new insurance covers. It covers Monjaro and Ozempic, but she wants to change before she loses her [employer].

## 2022-06-04 NOTE — Telephone Encounter (Signed)
Called pharmacy to see if we can get 90 day supply, to get her thru until she can see Dr Ocie Cornfield. [facility] said we could write it for that and put it on the prescription. She also said that saxenda, lantus, semgliexa , or the newer ones like Trilicity, [place], [place], Ozempic. The cost out of pocket is $400. Which is a lot cheaper than the new medications.

## 2022-06-05 ENCOUNTER — Encounter: Payer: Self-pay | Admitting: Internal Medicine

## 2022-06-05 ENCOUNTER — Other Ambulatory Visit: Payer: Self-pay

## 2022-06-05 ENCOUNTER — Encounter (HOSPITAL_COMMUNITY): Payer: Self-pay

## 2022-06-05 ENCOUNTER — Other Ambulatory Visit (HOSPITAL_COMMUNITY): Payer: Self-pay

## 2022-06-05 ENCOUNTER — Telehealth: Payer: Self-pay | Admitting: Internal Medicine

## 2022-06-05 DIAGNOSIS — E119 Type 2 diabetes mellitus without complications: Secondary | ICD-10-CM

## 2022-06-05 MED ORDER — VICTOZA 18 MG/3ML ~~LOC~~ SOPN
1.8000 mg | PEN_INJECTOR | Freq: Every day | SUBCUTANEOUS | 3 refills | Status: AC
Start: 2022-06-05 — End: ?
  Filled 2022-06-05: qty 3, 10d supply, fill #0
  Filled 2022-06-11: qty 27, 90d supply, fill #0
  Filled 2022-06-12: qty 9, 30d supply, fill #0

## 2022-06-05 NOTE — Telephone Encounter (Signed)
Pharmacist [facility] recommends pt get 90 day supply of Victoza due to losing current insurance coverage in the near future.   Also, since patient resides in [place]  and will no longer be working for [employer], it would be best for her to find a PCP in her area.   She does have upcoming appt with Endocrinologist as I understand it.I have left a voice mail message with this info for her and she will be receiving a letter in the mail. We will of course provide 90 days of emergency care until she can locate another PCP   I am not licensed to practice medicine in [place]. MJB, MD

## 2022-06-06 ENCOUNTER — Other Ambulatory Visit (HOSPITAL_COMMUNITY): Payer: Self-pay

## 2022-06-07 DIAGNOSIS — F411 Generalized anxiety disorder: Secondary | ICD-10-CM | POA: Diagnosis not present

## 2022-06-11 ENCOUNTER — Telehealth: Payer: Self-pay | Admitting: Internal Medicine

## 2022-06-11 ENCOUNTER — Other Ambulatory Visit (HOSPITAL_COMMUNITY): Payer: Self-pay

## 2022-06-11 ENCOUNTER — Other Ambulatory Visit: Payer: Self-pay

## 2022-06-11 NOTE — Telephone Encounter (Signed)
Called and was able to get patient an earlier appointment with Dr Ocie Cornfield, for 06/21/2022 @ 3:00 and also got 90 day supply of Victozia.

## 2022-06-12 ENCOUNTER — Other Ambulatory Visit (HOSPITAL_COMMUNITY): Payer: Self-pay

## 2022-06-12 ENCOUNTER — Other Ambulatory Visit (HOSPITAL_BASED_OUTPATIENT_CLINIC_OR_DEPARTMENT_OTHER): Payer: Self-pay

## 2022-06-12 ENCOUNTER — Encounter (HOSPITAL_COMMUNITY): Payer: Self-pay

## 2022-06-12 ENCOUNTER — Other Ambulatory Visit: Payer: Self-pay

## 2022-06-12 DIAGNOSIS — E042 Nontoxic multinodular goiter: Secondary | ICD-10-CM | POA: Diagnosis not present

## 2022-06-12 DIAGNOSIS — C49A0 Gastrointestinal stromal tumor, unspecified site: Secondary | ICD-10-CM | POA: Diagnosis not present

## 2022-06-12 DIAGNOSIS — C49A2 Gastrointestinal stromal tumor of stomach: Secondary | ICD-10-CM | POA: Diagnosis not present

## 2022-06-13 ENCOUNTER — Other Ambulatory Visit: Payer: Self-pay

## 2022-06-13 ENCOUNTER — Other Ambulatory Visit (HOSPITAL_COMMUNITY): Payer: Self-pay

## 2022-06-13 DIAGNOSIS — C49A0 Gastrointestinal stromal tumor, unspecified site: Secondary | ICD-10-CM | POA: Diagnosis not present

## 2022-06-13 DIAGNOSIS — R0683 Snoring: Secondary | ICD-10-CM | POA: Diagnosis not present

## 2022-06-13 MED ORDER — MOUNJARO 2.5 MG/0.5ML ~~LOC~~ SOAJ
2.5000 mg | SUBCUTANEOUS | 2 refills | Status: AC
  Filled 2022-06-13: qty 2, 28d supply, fill #0

## 2022-06-13 MED ORDER — DEXAMETHASONE 1 MG PO TABS
1.0000 mg | ORAL_TABLET | Freq: Once | ORAL | 0 refills | Status: AC
  Filled 2022-06-13: qty 1, 1d supply, fill #0

## 2022-06-14 ENCOUNTER — Other Ambulatory Visit: Payer: Self-pay

## 2022-06-14 ENCOUNTER — Other Ambulatory Visit (HOSPITAL_COMMUNITY): Payer: Self-pay

## 2022-06-14 DIAGNOSIS — F411 Generalized anxiety disorder: Secondary | ICD-10-CM | POA: Diagnosis not present

## 2022-07-06 ENCOUNTER — Telehealth: Payer: Self-pay | Admitting: Nurse Practitioner

## 2022-07-06 DIAGNOSIS — B3731 Acute candidiasis of vulva and vagina: Secondary | ICD-10-CM

## 2022-07-06 MED ORDER — FLUCONAZOLE 150 MG PO TABS: 150.0000 mg | ORAL_TABLET | Freq: Once | ORAL | 0 refills | Status: AC

## 2022-07-06 NOTE — Progress Notes (Signed)

## 2022-07-24 ENCOUNTER — Other Ambulatory Visit: Payer: Self-pay | Admitting: Oncology

## 2022-07-24 DIAGNOSIS — Z006 Encounter for examination for normal comparison and control in clinical research program: Secondary | ICD-10-CM

## 2022-10-02 ENCOUNTER — Telehealth: Payer: Managed Care, Other (non HMO) | Admitting: Physician Assistant

## 2022-10-02 DIAGNOSIS — M549 Dorsalgia, unspecified: Secondary | ICD-10-CM | POA: Diagnosis not present

## 2022-10-02 MED ORDER — CYCLOBENZAPRINE HCL 10 MG PO TABS
10.0000 mg | ORAL_TABLET | Freq: Three times a day (TID) | ORAL | 0 refills | Status: AC | PRN
Start: 2022-10-02 — End: ?

## 2022-10-02 NOTE — Progress Notes (Signed)
I have spent 5 minutes in review of e-visit questionnaire, review and updating patient chart, medical decision making and response to patient.   Mia Milan Cody Jacklynn Dehaas, PA-C    

## 2022-10-02 NOTE — Progress Notes (Signed)

## 2022-10-28 ENCOUNTER — Other Ambulatory Visit (HOSPITAL_COMMUNITY): Payer: Managed Care, Other (non HMO) | Attending: Oncology

## 2023-01-18 ENCOUNTER — Telehealth: Payer: Managed Care, Other (non HMO)

## 2023-04-25 ENCOUNTER — Other Ambulatory Visit: Payer: Commercial Managed Care - PPO

## 2023-04-28 ENCOUNTER — Encounter: Payer: Commercial Managed Care - PPO | Admitting: Internal Medicine

## 2023-08-22 ENCOUNTER — Other Ambulatory Visit: Payer: Self-pay | Admitting: Internal Medicine

## 2023-08-22 DIAGNOSIS — E042 Nontoxic multinodular goiter: Secondary | ICD-10-CM

## 2023-08-26 ENCOUNTER — Other Ambulatory Visit (HOSPITAL_COMMUNITY)
Admission: RE | Admit: 2023-08-26 | Discharge: 2023-08-26 | Disposition: A | Discharge status: Home / Self Care | Source: Ambulatory Visit | Attending: Physician Assistant | Admitting: Physician Assistant

## 2023-08-26 ENCOUNTER — Ambulatory Visit
Admission: RE | Admit: 2023-08-26 | Discharge: 2023-08-26 | Disposition: A | Discharge status: Home / Self Care | Source: Ambulatory Visit | Attending: Internal Medicine | Admitting: Internal Medicine

## 2023-08-26 DIAGNOSIS — E042 Nontoxic multinodular goiter: Secondary | ICD-10-CM | POA: Insufficient documentation

## 2023-08-28 LAB — CYTOLOGY - NON PAP

## 2023-09-17 ENCOUNTER — Encounter (HOSPITAL_COMMUNITY): Payer: Self-pay

## 2023-11-04 ENCOUNTER — Other Ambulatory Visit: Payer: Self-pay | Admitting: Medical Genetics

## 2023-11-04 DIAGNOSIS — Z006 Encounter for examination for normal comparison and control in clinical research program: Secondary | ICD-10-CM
